# Patient Record
Sex: Female | Born: 1945
Health system: Southern US, Community
[De-identification: ages and names within clinical notes are randomized; demographics above are authoritative.]

## PROBLEM LIST (undated history)

## (undated) DIAGNOSIS — I10 Essential (primary) hypertension: Secondary | ICD-10-CM

## (undated) DIAGNOSIS — L03317 Cellulitis of buttock: Secondary | ICD-10-CM

## (undated) DIAGNOSIS — F0280 Dementia in other diseases classified elsewhere without behavioral disturbance: Secondary | ICD-10-CM

## (undated) DIAGNOSIS — E785 Hyperlipidemia, unspecified: Secondary | ICD-10-CM

## (undated) DIAGNOSIS — F419 Anxiety disorder, unspecified: Secondary | ICD-10-CM

## (undated) DIAGNOSIS — K219 Gastro-esophageal reflux disease without esophagitis: Secondary | ICD-10-CM

## (undated) DIAGNOSIS — J45909 Unspecified asthma, uncomplicated: Secondary | ICD-10-CM

## (undated) DIAGNOSIS — I219 Acute myocardial infarction, unspecified: Secondary | ICD-10-CM

## (undated) DIAGNOSIS — Z973 Presence of spectacles and contact lenses: Secondary | ICD-10-CM

## (undated) DIAGNOSIS — I2510 Atherosclerotic heart disease of native coronary artery without angina pectoris: Secondary | ICD-10-CM

## (undated) DIAGNOSIS — D649 Anemia, unspecified: Secondary | ICD-10-CM

## (undated) DIAGNOSIS — M1990 Unspecified osteoarthritis, unspecified site: Secondary | ICD-10-CM

## (undated) DIAGNOSIS — I739 Peripheral vascular disease, unspecified: Secondary | ICD-10-CM

## (undated) DIAGNOSIS — R011 Cardiac murmur, unspecified: Secondary | ICD-10-CM

## (undated) HISTORY — DX: Acute myocardial infarction, unspecified: I21.9

## (undated) HISTORY — PX: OTHER SURGICAL HISTORY: SHX169

## (undated) HISTORY — DX: Gastro-esophageal reflux disease without esophagitis: K21.9

## (undated) HISTORY — PX: TONSILLECTOMY: SUR1361

## (undated) HISTORY — PX: VASCULAR SURGERY: SHX849

## (undated) HISTORY — DX: Hyperlipidemia, unspecified: E78.5

## (undated) HISTORY — DX: Cellulitis of buttock: L03.317

## (undated) HISTORY — DX: Atherosclerotic heart disease of native coronary artery without angina pectoris: I25.10

## (undated) HISTORY — DX: Essential (primary) hypertension: I10

## (undated) HISTORY — DX: Peripheral vascular disease, unspecified: I73.9

## (undated) HISTORY — PX: ABDOMINAL HYSTERECTOMY: SHX81

## (undated) HISTORY — DX: Unspecified asthma, uncomplicated: J45.909

## (undated) HISTORY — DX: Unspecified osteoarthritis, unspecified site: M19.90

---

## 2004-08-23 ENCOUNTER — Emergency Department (HOSPITAL_COMMUNITY): Admission: EM | Admit: 2004-08-23 | Discharge: 2004-08-23 | Payer: Self-pay | Admitting: Emergency Medicine

## 2004-12-19 ENCOUNTER — Ambulatory Visit (HOSPITAL_COMMUNITY): Admission: RE | Admit: 2004-12-19 | Discharge: 2004-12-19 | Payer: Self-pay | Admitting: Family Medicine

## 2006-06-12 ENCOUNTER — Ambulatory Visit (HOSPITAL_COMMUNITY): Admission: RE | Admit: 2006-06-12 | Discharge: 2006-06-12 | Payer: Self-pay | Admitting: Internal Medicine

## 2007-04-11 ENCOUNTER — Emergency Department (HOSPITAL_COMMUNITY): Admission: EM | Admit: 2007-04-11 | Discharge: 2007-04-11 | Payer: Self-pay | Admitting: Emergency Medicine

## 2007-07-12 ENCOUNTER — Emergency Department (HOSPITAL_COMMUNITY): Admission: EM | Admit: 2007-07-12 | Discharge: 2007-07-13 | Payer: Self-pay | Admitting: Emergency Medicine

## 2007-12-04 ENCOUNTER — Ambulatory Visit (HOSPITAL_COMMUNITY): Admission: RE | Admit: 2007-12-04 | Discharge: 2007-12-04 | Payer: Self-pay | Admitting: Family Medicine

## 2008-10-15 DIAGNOSIS — L03317 Cellulitis of buttock: Secondary | ICD-10-CM

## 2008-10-15 HISTORY — PX: FOOT AMPUTATION: SHX951

## 2008-10-15 HISTORY — DX: Cellulitis of buttock: L03.317

## 2009-09-30 ENCOUNTER — Emergency Department (HOSPITAL_COMMUNITY): Admission: EM | Admit: 2009-09-30 | Discharge: 2009-10-01 | Payer: Self-pay | Admitting: Emergency Medicine

## 2009-10-04 ENCOUNTER — Encounter (HOSPITAL_COMMUNITY): Admission: RE | Admit: 2009-10-04 | Discharge: 2009-10-12 | Payer: Self-pay | Admitting: Emergency Medicine

## 2009-10-13 ENCOUNTER — Ambulatory Visit (HOSPITAL_COMMUNITY): Admission: RE | Admit: 2009-10-13 | Discharge: 2009-10-13 | Payer: Self-pay | Admitting: Family Medicine

## 2009-10-15 HISTORY — PX: OTHER SURGICAL HISTORY: SHX169

## 2009-10-27 ENCOUNTER — Ambulatory Visit: Payer: Self-pay | Admitting: Vascular Surgery

## 2009-11-07 ENCOUNTER — Ambulatory Visit: Payer: Self-pay | Admitting: Vascular Surgery

## 2009-11-07 ENCOUNTER — Ambulatory Visit (HOSPITAL_COMMUNITY): Admission: RE | Admit: 2009-11-07 | Discharge: 2009-11-07 | Payer: Self-pay | Admitting: Vascular Surgery

## 2009-11-24 ENCOUNTER — Ambulatory Visit: Payer: Self-pay | Admitting: Vascular Surgery

## 2010-01-15 ENCOUNTER — Encounter: Payer: Self-pay | Admitting: Emergency Medicine

## 2010-01-16 ENCOUNTER — Inpatient Hospital Stay (HOSPITAL_COMMUNITY): Admission: EM | Admit: 2010-01-16 | Discharge: 2010-01-23 | Payer: Self-pay | Admitting: Pulmonary Disease

## 2010-01-16 ENCOUNTER — Ambulatory Visit: Payer: Self-pay | Admitting: Internal Medicine

## 2010-01-16 ENCOUNTER — Encounter: Payer: Self-pay | Admitting: Pulmonary Disease

## 2010-01-17 ENCOUNTER — Encounter (INDEPENDENT_AMBULATORY_CARE_PROVIDER_SITE_OTHER): Payer: Self-pay | Admitting: Internal Medicine

## 2010-01-20 ENCOUNTER — Encounter (INDEPENDENT_AMBULATORY_CARE_PROVIDER_SITE_OTHER): Payer: Self-pay | Admitting: Internal Medicine

## 2010-01-20 ENCOUNTER — Ambulatory Visit: Payer: Self-pay | Admitting: Vascular Surgery

## 2010-02-15 ENCOUNTER — Ambulatory Visit: Payer: Self-pay | Admitting: Vascular Surgery

## 2010-05-17 ENCOUNTER — Ambulatory Visit: Payer: Self-pay | Admitting: Vascular Surgery

## 2010-06-30 ENCOUNTER — Emergency Department (HOSPITAL_COMMUNITY): Admission: EM | Admit: 2010-06-30 | Discharge: 2010-06-30 | Payer: Self-pay | Admitting: Emergency Medicine

## 2010-07-07 ENCOUNTER — Inpatient Hospital Stay (HOSPITAL_COMMUNITY): Admission: EM | Admit: 2010-07-07 | Discharge: 2010-07-10 | Payer: Self-pay | Admitting: Emergency Medicine

## 2010-07-15 HISTORY — PX: FEMORAL-POPLITEAL BYPASS GRAFT: SHX937

## 2010-07-21 ENCOUNTER — Telehealth: Payer: Self-pay | Admitting: Orthopedic Surgery

## 2010-07-26 ENCOUNTER — Ambulatory Visit: Payer: Self-pay | Admitting: Vascular Surgery

## 2010-07-27 ENCOUNTER — Ambulatory Visit: Payer: Self-pay | Admitting: Vascular Surgery

## 2010-07-27 ENCOUNTER — Encounter: Payer: Self-pay | Admitting: Vascular Surgery

## 2010-07-27 ENCOUNTER — Inpatient Hospital Stay (HOSPITAL_COMMUNITY): Admission: RE | Admit: 2010-07-27 | Discharge: 2010-08-03 | Payer: Self-pay | Admitting: Vascular Surgery

## 2010-07-31 ENCOUNTER — Encounter: Payer: Self-pay | Admitting: Vascular Surgery

## 2010-08-17 ENCOUNTER — Ambulatory Visit: Payer: Self-pay | Admitting: Vascular Surgery

## 2010-08-30 ENCOUNTER — Ambulatory Visit: Payer: Self-pay | Admitting: Vascular Surgery

## 2010-11-16 NOTE — Progress Notes (Signed)
Summary: cancellation of appt, Sherri Brooks ref to foot surgeon by podiatrist  Phone Note Outgoing Call   Call placed to: Sherri Brooks Summary of Call: Reminder Call made to remind of appt with Dr Natasha Mead, 07/24/10. Per Sherri Brooks and daughter Sherri Brooks, Sherri Brooks has already been seen by podiatrist Dr Ulice Brilliant [facility], and he has referred her to foot surgeon; therefore, cancel this appointment. Initial call taken by: Cammie Sickle,  July 21, 2010 9:43 AM

## 2010-11-22 ENCOUNTER — Other Ambulatory Visit (INDEPENDENT_AMBULATORY_CARE_PROVIDER_SITE_OTHER): Payer: MEDICARE

## 2010-11-22 ENCOUNTER — Encounter (INDEPENDENT_AMBULATORY_CARE_PROVIDER_SITE_OTHER): Payer: Medicare Other

## 2010-11-22 DIAGNOSIS — I70269 Atherosclerosis of native arteries of extremities with gangrene, unspecified extremity: Secondary | ICD-10-CM

## 2010-11-22 DIAGNOSIS — Z48812 Encounter for surgical aftercare following surgery on the circulatory system: Secondary | ICD-10-CM

## 2010-11-29 NOTE — Procedures (Unsigned)
BYPASS GRAFT EVALUATION  INDICATION:  Left lower extremity bypass graft.  HISTORY: Diabetes:  Yes. Cardiac:  Yes. Hypertension:  Yes. Smoking:  Yes. Previous Surgery:  Right common iliac artery PTA/stent on 11/07/2009, left fem-pop bypass graft on 07/27/2010.  SINGLE LEVEL ARTERIAL EXAM                              RIGHT              LEFT Brachial:                    187                205 Anterior tibial:             201                218 Posterior tibial:            122                210 Peroneal: Ankle/brachial index:        1.08               1.06  PREVIOUS ABI:  Date: 05/17/2010  RIGHT:  1.0  LEFT:  0.62  LOWER EXTREMITY BYPASS GRAFT DUPLEX EXAM:  DUPLEX:  Biphasic Doppler waveforms noted throughout the left lower extremity bypass graft in its native vessels with no increase in velocities.  IMPRESSION: 1. Patent left femoropopliteal bypass graft with no evidence of     stenosis. 2. Bilateral ankle brachial indices and Doppler waveforms suggest     normal perfusion of the bilateral lower extremities.  ___________________________________________ Di Kindle. Edilia Bo, M.D.  CH/MEDQ  D:  11/22/2010  T:  11/22/2010  Job:  [id number]

## 2010-11-29 NOTE — Procedures (Unsigned)
AORTA-ILIAC DUPLEX EVALUATION  INDICATION:  Right common iliac artery stent.  HISTORY: Diabetes:  Yes. Cardiac:  Yes. Hypertension:  Yes. Smoking:  Yes. Previous Surgery:  Right common iliac artery PTA/stent on 11/07/2009, left fem-pop bypass graft on 07/27/2010.              SINGLE LEVEL ARTERIAL EXAM                             RIGHT                  LEFT Brachial:                  187                    205 Anterior tibial:           201                    218 Posterior tibial:          222                    210 Peroneal: Ankle/brachial index:      1.08                   1.06 Previous ABI/date:         05/17/2010, 1.0        05/17/2010, 0.62  AORTA-ILIAC DUPLEX EXAM Aorta - Proximal     49 cm/s Aorta - Mid          55 cm/s Aorta - Distal       78 cm/s  RIGHT                                   LEFT 160 cm/s          CIA-PROXIMAL 206 cm/s          CIA-DISTAL Not visualized    HYPOGASTRIC 162 cm/s          EIA-PROXIMAL 128 cm/s          EIA-MID 110 cm/s          EIA-DISTAL  IMPRESSION: 1. Patent right common iliac artery stent with a maximal Doppler     velocity of 206 cm/s noted.  The right hypogastric artery was not     adequately visualized due to overlying bowel gas patterns. 2. The bilateral ankle brachial indices and Doppler waveforms suggest     normal perfusion of the bilateral lower extremities.  ___________________________________________ Di Kindle. Edilia Bo, M.D.  CH/MEDQ  D:  11/22/2010  T:  11/22/2010  Job:  [id number]

## 2010-12-27 LAB — GLUCOSE, CAPILLARY
Glucose-Capillary: 117 mg/dL — ABNORMAL HIGH (ref 70–99)
Glucose-Capillary: 129 mg/dL — ABNORMAL HIGH (ref 70–99)
Glucose-Capillary: 135 mg/dL — ABNORMAL HIGH (ref 70–99)
Glucose-Capillary: 150 mg/dL — ABNORMAL HIGH (ref 70–99)
Glucose-Capillary: 151 mg/dL — ABNORMAL HIGH (ref 70–99)
Glucose-Capillary: 154 mg/dL — ABNORMAL HIGH (ref 70–99)
Glucose-Capillary: 169 mg/dL — ABNORMAL HIGH (ref 70–99)
Glucose-Capillary: 209 mg/dL — ABNORMAL HIGH (ref 70–99)
Glucose-Capillary: 225 mg/dL — ABNORMAL HIGH (ref 70–99)
Glucose-Capillary: 313 mg/dL — ABNORMAL HIGH (ref 70–99)
Glucose-Capillary: 33 mg/dL — CL (ref 70–99)
Glucose-Capillary: 39 mg/dL — CL (ref 70–99)
Glucose-Capillary: 42 mg/dL — CL (ref 70–99)
Glucose-Capillary: 53 mg/dL — ABNORMAL LOW (ref 70–99)
Glucose-Capillary: 72 mg/dL (ref 70–99)
Glucose-Capillary: 82 mg/dL (ref 70–99)
Glucose-Capillary: 86 mg/dL (ref 70–99)

## 2010-12-27 LAB — CBC
HCT: 19.2 % — ABNORMAL LOW (ref 36.0–46.0)
HCT: 21.9 % — ABNORMAL LOW (ref 36.0–46.0)
HCT: 27.1 % — ABNORMAL LOW (ref 36.0–46.0)
HCT: 27.4 % — ABNORMAL LOW (ref 36.0–46.0)
Hemoglobin: 7.5 g/dL — ABNORMAL LOW (ref 12.0–15.0)
Hemoglobin: 9.3 g/dL — ABNORMAL LOW (ref 12.0–15.0)
MCH: 28.7 pg (ref 26.0–34.0)
MCH: 28.7 pg (ref 26.0–34.0)
MCH: 29 pg (ref 26.0–34.0)
MCH: 29.2 pg (ref 26.0–34.0)
MCH: 29.3 pg (ref 26.0–34.0)
MCHC: 33.5 g/dL (ref 30.0–36.0)
MCHC: 34.2 g/dL (ref 30.0–36.0)
MCHC: 34.3 g/dL (ref 30.0–36.0)
MCV: 83.9 fL (ref 78.0–100.0)
MCV: 84.3 fL (ref 78.0–100.0)
MCV: 84.4 fL (ref 78.0–100.0)
MCV: 85.3 fL (ref 78.0–100.0)
MCV: 85.8 fL (ref 78.0–100.0)
Platelets: 237 10*3/uL (ref 150–400)
Platelets: 254 10*3/uL (ref 150–400)
Platelets: 290 10*3/uL (ref 150–400)
Platelets: 296 10*3/uL (ref 150–400)
RBC: 2.61 MIL/uL — ABNORMAL LOW (ref 3.87–5.11)
RBC: 3.21 MIL/uL — ABNORMAL LOW (ref 3.87–5.11)
RBC: 3.25 MIL/uL — ABNORMAL LOW (ref 3.87–5.11)
RDW: 14.3 % (ref 11.5–15.5)
RDW: 14.3 % (ref 11.5–15.5)
RDW: 14.4 % (ref 11.5–15.5)
WBC: 12.7 10*3/uL — ABNORMAL HIGH (ref 4.0–10.5)
WBC: 16.9 10*3/uL — ABNORMAL HIGH (ref 4.0–10.5)

## 2010-12-27 LAB — BASIC METABOLIC PANEL WITH GFR
BUN: 6 mg/dL (ref 6–23)
BUN: 8 mg/dL (ref 6–23)
CO2: 23 meq/L (ref 19–32)
CO2: 26 meq/L (ref 19–32)
Calcium: 8.2 mg/dL — ABNORMAL LOW (ref 8.4–10.5)
Calcium: 8.6 mg/dL (ref 8.4–10.5)
Chloride: 106 meq/L (ref 96–112)
Chloride: 108 meq/L (ref 96–112)
Creatinine, Ser: 1.02 mg/dL (ref 0.4–1.2)
Creatinine, Ser: 1.08 mg/dL (ref 0.4–1.2)
GFR calc non Af Amer: 51 mL/min — ABNORMAL LOW
GFR calc non Af Amer: 54 mL/min — ABNORMAL LOW
Glucose, Bld: 76 mg/dL (ref 70–99)
Glucose, Bld: 82 mg/dL (ref 70–99)
Potassium: 3.5 meq/L (ref 3.5–5.1)
Potassium: 3.5 meq/L (ref 3.5–5.1)
Sodium: 137 meq/L (ref 135–145)
Sodium: 138 meq/L (ref 135–145)

## 2010-12-27 LAB — BASIC METABOLIC PANEL
BUN: 15 mg/dL (ref 6–23)
BUN: 9 mg/dL (ref 6–23)
CO2: 24 mEq/L (ref 19–32)
CO2: 26 mEq/L (ref 19–32)
CO2: 26 mEq/L (ref 19–32)
Calcium: 8.3 mg/dL — ABNORMAL LOW (ref 8.4–10.5)
Calcium: 8.6 mg/dL (ref 8.4–10.5)
Chloride: 102 mEq/L (ref 96–112)
Chloride: 102 mEq/L (ref 96–112)
Chloride: 105 mEq/L (ref 96–112)
Creatinine, Ser: 1.2 mg/dL (ref 0.4–1.2)
Creatinine, Ser: 1.35 mg/dL — ABNORMAL HIGH (ref 0.4–1.2)
GFR calc Af Amer: 48 mL/min — ABNORMAL LOW (ref >60)
GFR calc Af Amer: 54 mL/min — ABNORMAL LOW (ref >60)
GFR calc Af Amer: 55 mL/min — ABNORMAL LOW (ref >60)
GFR calc non Af Amer: 39 mL/min — ABNORMAL LOW (ref >60)
Glucose, Bld: 194 mg/dL — ABNORMAL HIGH (ref 70–99)
Glucose, Bld: 212 mg/dL — ABNORMAL HIGH (ref 70–99)
Potassium: 3.2 mEq/L — ABNORMAL LOW (ref 3.5–5.1)
Potassium: 3.4 mEq/L — ABNORMAL LOW (ref 3.5–5.1)
Sodium: 131 mEq/L — ABNORMAL LOW (ref 135–145)
Sodium: 136 mEq/L (ref 135–145)

## 2010-12-28 LAB — COMPREHENSIVE METABOLIC PANEL
ALT: 12 U/L (ref 0–35)
ALT: 12 U/L (ref 0–35)
AST: 16 U/L (ref 0–37)
Albumin: 3.1 g/dL — ABNORMAL LOW (ref 3.5–5.2)
Alkaline Phosphatase: 91 U/L (ref 39–117)
Alkaline Phosphatase: 73 U/L (ref 39–117)
BUN: 18 mg/dL (ref 6–23)
CO2: 25 mEq/L (ref 19–32)
Calcium: 9.6 mg/dL (ref 8.4–10.5)
Calcium: 8.7 mg/dL (ref 8.4–10.5)
Chloride: 102 mEq/L (ref 96–112)
GFR calc Af Amer: 55 mL/min — ABNORMAL LOW (ref >60)
GFR calc non Af Amer: 46 mL/min — ABNORMAL LOW (ref >60)
Potassium: 4.3 mEq/L (ref 3.5–5.1)
Potassium: 3.9 mEq/L (ref 3.5–5.1)
Sodium: 136 mEq/L (ref 135–145)
Sodium: 136 mEq/L (ref 135–145)
Total Protein: 7.9 g/dL (ref 6.0–8.3)

## 2010-12-28 LAB — TYPE AND SCREEN
ABO/RH(D): O POS
Antibody Screen: NEGATIVE
Unit division: 0

## 2010-12-28 LAB — CBC
HCT: 31.6 % — ABNORMAL LOW (ref 36.0–46.0)
HCT: 32.5 % — ABNORMAL LOW (ref 36.0–46.0)
HCT: 33.5 % — ABNORMAL LOW (ref 36.0–46.0)
HCT: 38.2 % (ref 36.0–46.0)
Hemoglobin: 9.2 g/dL — ABNORMAL LOW (ref 12.0–15.0)
Hemoglobin: 10.6 g/dL — ABNORMAL LOW (ref 12.0–15.0)
Hemoglobin: 10.9 g/dL — ABNORMAL LOW (ref 12.0–15.0)
Hemoglobin: 12.8 g/dL (ref 12.0–15.0)
MCH: 29.8 pg (ref 26.0–34.0)
MCH: 30.1 pg (ref 26.0–34.0)
MCHC: 34.1 g/dL (ref 30.0–36.0)
MCHC: 33.5 g/dL (ref 30.0–36.0)
MCHC: 33.6 g/dL (ref 30.0–36.0)
MCHC: 34.8 g/dL (ref 30.0–36.0)
MCV: 87.7 fL (ref 78.0–100.0)
MCV: 88.2 fL (ref 78.0–100.0)
MCV: 89.2 fL (ref 78.0–100.0)
Platelets: 300 10*3/uL (ref 150–400)
Platelets: 337 10*3/uL (ref 150–400)
Platelets: 439 10*3/uL — ABNORMAL HIGH (ref 150–400)
Platelets: 328 10*3/uL (ref 150–400)
RBC: 3.01 MIL/uL — ABNORMAL LOW (ref 3.87–5.11)
RBC: 3.51 MIL/uL — ABNORMAL LOW (ref 3.87–5.11)
RDW: 14.7 % (ref 11.5–15.5)
RDW: 14.7 % (ref 11.5–15.5)
RDW: 14.9 % (ref 11.5–15.5)
RDW: 15.9 % — ABNORMAL HIGH (ref 11.5–15.5)
RDW: 17.3 % — ABNORMAL HIGH (ref 11.5–15.5)
WBC: 11.1 10*3/uL — ABNORMAL HIGH (ref 4.0–10.5)
WBC: 14.1 10*3/uL — ABNORMAL HIGH (ref 4.0–10.5)
WBC: 11.8 10*3/uL — ABNORMAL HIGH (ref 4.0–10.5)
WBC: 12.9 10*3/uL — ABNORMAL HIGH (ref 4.0–10.5)

## 2010-12-28 LAB — GLUCOSE, CAPILLARY
Glucose-Capillary: 111 mg/dL — ABNORMAL HIGH (ref 70–99)
Glucose-Capillary: 134 mg/dL — ABNORMAL HIGH (ref 70–99)
Glucose-Capillary: 156 mg/dL — ABNORMAL HIGH (ref 70–99)
Glucose-Capillary: 170 mg/dL — ABNORMAL HIGH (ref 70–99)
Glucose-Capillary: 171 mg/dL — ABNORMAL HIGH (ref 70–99)
Glucose-Capillary: 19 mg/dL — CL (ref 70–99)
Glucose-Capillary: 235 mg/dL — ABNORMAL HIGH (ref 70–99)
Glucose-Capillary: 75 mg/dL (ref 70–99)
Glucose-Capillary: 150 mg/dL — ABNORMAL HIGH (ref 70–99)
Glucose-Capillary: 179 mg/dL — ABNORMAL HIGH (ref 70–99)
Glucose-Capillary: 220 mg/dL — ABNORMAL HIGH (ref 70–99)

## 2010-12-28 LAB — DIFFERENTIAL
Basophils Absolute: 0 10*3/uL (ref 0.0–0.1)
Basophils Absolute: 0.1 10*3/uL (ref 0.0–0.1)
Basophils Relative: 0 % (ref 0–1)
Basophils Relative: 0 % (ref 0–1)
Basophils Relative: 0 % (ref 0–1)
Basophils Relative: 1 % (ref 0–1)
Eosinophils Absolute: 0.2 10*3/uL (ref 0.0–0.7)
Eosinophils Absolute: 0.3 10*3/uL (ref 0.0–0.7)
Eosinophils Relative: 1 % (ref 0–5)
Eosinophils Relative: 2 % (ref 0–5)
Eosinophils Relative: 2 % (ref 0–5)
Lymphs Abs: 3.1 10*3/uL (ref 0.7–4.0)
Monocytes Absolute: 0.7 10*3/uL (ref 0.1–1.0)
Monocytes Absolute: 0.8 10*3/uL (ref 0.1–1.0)
Monocytes Absolute: 0.9 10*3/uL (ref 0.1–1.0)
Monocytes Relative: 7 % (ref 3–12)
Monocytes Relative: 8 % (ref 3–12)
Neutro Abs: 6.8 10*3/uL (ref 1.7–7.7)

## 2010-12-28 LAB — BASIC METABOLIC PANEL
BUN: 22 mg/dL (ref 6–23)
BUN: 14 mg/dL (ref 6–23)
BUN: 20 mg/dL (ref 6–23)
CO2: 25 mEq/L (ref 19–32)
CO2: 27 mEq/L (ref 19–32)
Calcium: 8.6 mg/dL (ref 8.4–10.5)
Chloride: 103 mEq/L (ref 96–112)
Chloride: 97 mEq/L (ref 96–112)
Creatinine, Ser: 1.65 mg/dL — ABNORMAL HIGH (ref 0.4–1.2)
Creatinine, Ser: 1.24 mg/dL — ABNORMAL HIGH (ref 0.4–1.2)
GFR calc Af Amer: 38 mL/min — ABNORMAL LOW (ref >60)
GFR calc non Af Amer: 43 mL/min — ABNORMAL LOW (ref >60)
GFR calc non Af Amer: 50 mL/min — ABNORMAL LOW (ref >60)
Glucose, Bld: 114 mg/dL — ABNORMAL HIGH (ref 70–99)
Glucose, Bld: 226 mg/dL — ABNORMAL HIGH (ref 70–99)
Glucose, Bld: 286 mg/dL — ABNORMAL HIGH (ref 70–99)
Potassium: 3.4 mEq/L — ABNORMAL LOW (ref 3.5–5.1)
Potassium: 3.7 mEq/L (ref 3.5–5.1)
Potassium: 3.8 mEq/L (ref 3.5–5.1)
Sodium: 139 mEq/L (ref 135–145)
Sodium: 140 mEq/L (ref 135–145)

## 2010-12-28 LAB — URINALYSIS, ROUTINE W REFLEX MICROSCOPIC
Hgb urine dipstick: NEGATIVE
Nitrite: NEGATIVE
Specific Gravity, Urine: 1.03 (ref 1.005–1.030)
Urobilinogen, UA: 1 mg/dL (ref 0.0–1.0)

## 2010-12-28 LAB — HEMOGLOBIN A1C: Hgb A1c MFr Bld: 9.2 % — ABNORMAL HIGH (ref <5.7)

## 2010-12-28 LAB — LIPID PANEL
HDL: 32 mg/dL — ABNORMAL LOW (ref >39)
Triglycerides: 88 mg/dL (ref <150)
VLDL: 18 mg/dL (ref 0–40)

## 2010-12-28 LAB — CULTURE, BLOOD (ROUTINE X 2)
Culture: NO GROWTH
Report Status: 9292011

## 2010-12-28 LAB — PROTIME-INR: INR: 1.11 (ref 0.00–1.49)

## 2010-12-28 LAB — VANCOMYCIN, RANDOM: Vancomycin Rm: <5 ug/mL

## 2011-01-01 LAB — POCT I-STAT, CHEM 8
Calcium, Ion: 1.15 mmol/L (ref 1.12–1.32)
Chloride: 112 mEq/L (ref 96–112)
HCT: 40 % (ref 36.0–46.0)
Sodium: 139 mEq/L (ref 135–145)

## 2011-01-01 LAB — GLUCOSE, CAPILLARY: Glucose-Capillary: 120 mg/dL — ABNORMAL HIGH (ref 70–99)

## 2011-01-03 LAB — COMPREHENSIVE METABOLIC PANEL
ALT: 13 U/L (ref 0–35)
ALT: 12 U/L (ref 0–35)
AST: 20 U/L (ref 0–37)
AST: 20 U/L (ref 0–37)
Albumin: 2.4 g/dL — ABNORMAL LOW (ref 3.5–5.2)
Albumin: 2.7 g/dL — ABNORMAL LOW (ref 3.5–5.2)
Alkaline Phosphatase: 90 U/L (ref 39–117)
BUN: 10 mg/dL (ref 6–23)
CO2: 16 mEq/L — ABNORMAL LOW (ref 19–32)
CO2: 15 mEq/L — ABNORMAL LOW (ref 19–32)
Calcium: 10.7 mg/dL — ABNORMAL HIGH (ref 8.4–10.5)
Calcium: 10.7 mg/dL — ABNORMAL HIGH (ref 8.4–10.5)
Creatinine, Ser: 0.89 mg/dL (ref 0.4–1.2)
GFR calc Af Amer: 22 mL/min — ABNORMAL LOW (ref >60)
GFR calc Af Amer: 25 mL/min — ABNORMAL LOW (ref >60)
GFR calc non Af Amer: 21 mL/min — ABNORMAL LOW (ref >60)
Glucose, Bld: 188 mg/dL — ABNORMAL HIGH (ref 70–99)
Sodium: 132 mEq/L — ABNORMAL LOW (ref 135–145)
Sodium: 133 mEq/L — ABNORMAL LOW (ref 135–145)
Total Bilirubin: 0.5 mg/dL (ref 0.3–1.2)
Total Protein: 9.8 g/dL — ABNORMAL HIGH (ref 6.0–8.3)
Total Protein: 6.9 g/dL (ref 6.0–8.3)
Total Protein: 8.2 g/dL (ref 6.0–8.3)

## 2011-01-03 LAB — BASIC METABOLIC PANEL
BUN: 11 mg/dL (ref 6–23)
BUN: 17 mg/dL (ref 6–23)
BUN: 24 mg/dL — ABNORMAL HIGH (ref 6–23)
BUN: 7 mg/dL (ref 6–23)
BUN: 7 mg/dL (ref 6–23)
CO2: 15 mEq/L — ABNORMAL LOW (ref 19–32)
CO2: 21 mEq/L (ref 19–32)
CO2: 23 mEq/L (ref 19–32)
CO2: 23 mEq/L (ref 19–32)
Calcium: 10.1 mg/dL (ref 8.4–10.5)
Calcium: 7.6 mg/dL — ABNORMAL LOW (ref 8.4–10.5)
Calcium: 7.7 mg/dL — ABNORMAL LOW (ref 8.4–10.5)
Calcium: 7.8 mg/dL — ABNORMAL LOW (ref 8.4–10.5)
Calcium: 8.1 mg/dL — ABNORMAL LOW (ref 8.4–10.5)
Calcium: 8.4 mg/dL (ref 8.4–10.5)
Calcium: 9.1 mg/dL (ref 8.4–10.5)
Chloride: 107 mEq/L (ref 96–112)
Chloride: 108 mEq/L (ref 96–112)
Chloride: 108 mEq/L (ref 96–112)
Chloride: 115 mEq/L — ABNORMAL HIGH (ref 96–112)
Chloride: 117 mEq/L — ABNORMAL HIGH (ref 96–112)
Creatinine, Ser: 0.86 mg/dL (ref 0.4–1.2)
Creatinine, Ser: 1.26 mg/dL — ABNORMAL HIGH (ref 0.4–1.2)
Creatinine, Ser: 1.35 mg/dL — ABNORMAL HIGH (ref 0.4–1.2)
Creatinine, Ser: 2.02 mg/dL — ABNORMAL HIGH (ref 0.4–1.2)
GFR calc Af Amer: 30 mL/min — ABNORMAL LOW (ref >60)
GFR calc Af Amer: 35 mL/min — ABNORMAL LOW (ref >60)
GFR calc Af Amer: 48 mL/min — ABNORMAL LOW (ref >60)
GFR calc Af Amer: 52 mL/min — ABNORMAL LOW (ref >60)
GFR calc Af Amer: >60 mL/min (ref >60)
GFR calc Af Amer: >60 mL/min (ref >60)
GFR calc non Af Amer: 29 mL/min — ABNORMAL LOW (ref >60)
GFR calc non Af Amer: 39 mL/min — ABNORMAL LOW (ref >60)
GFR calc non Af Amer: 42 mL/min — ABNORMAL LOW (ref >60)
GFR calc non Af Amer: >60 mL/min (ref >60)
Glucose, Bld: 209 mg/dL — ABNORMAL HIGH (ref 70–99)
Glucose, Bld: 242 mg/dL — ABNORMAL HIGH (ref 70–99)
Glucose, Bld: 267 mg/dL — ABNORMAL HIGH (ref 70–99)
Glucose, Bld: 58 mg/dL — ABNORMAL LOW (ref 70–99)
Potassium: 3 mEq/L — ABNORMAL LOW (ref 3.5–5.1)
Potassium: 3.4 mEq/L — ABNORMAL LOW (ref 3.5–5.1)
Potassium: 4.2 mEq/L (ref 3.5–5.1)
Potassium: 4.4 mEq/L (ref 3.5–5.1)
Sodium: 131 mEq/L — ABNORMAL LOW (ref 135–145)
Sodium: 134 mEq/L — ABNORMAL LOW (ref 135–145)
Sodium: 134 mEq/L — ABNORMAL LOW (ref 135–145)
Sodium: 135 mEq/L (ref 135–145)
Sodium: 137 mEq/L (ref 135–145)
Sodium: 139 mEq/L (ref 135–145)
Sodium: 141 mEq/L (ref 135–145)

## 2011-01-03 LAB — DIFFERENTIAL
Basophils Absolute: 0.1 10*3/uL (ref 0.0–0.1)
Eosinophils Absolute: 0 10*3/uL (ref 0.0–0.7)
Eosinophils Absolute: 0 10*3/uL (ref 0.0–0.7)
Eosinophils Absolute: 0.2 10*3/uL (ref 0.0–0.7)
Eosinophils Relative: 0 % (ref 0–5)
Eosinophils Relative: 0 % (ref 0–5)
Eosinophils Relative: 2 % (ref 0–5)
Lymphocytes Relative: 24 % (ref 12–46)
Lymphs Abs: 1.6 10*3/uL (ref 0.7–4.0)
Lymphs Abs: 1.7 10*3/uL (ref 0.7–4.0)
Lymphs Abs: 2.7 10*3/uL (ref 0.7–4.0)
Monocytes Absolute: 0.8 10*3/uL (ref 0.1–1.0)
Monocytes Relative: 6 % (ref 3–12)
Neutro Abs: 6.6 10*3/uL (ref 1.7–7.7)
Neutrophils Relative %: 71 % (ref 43–77)

## 2011-01-03 LAB — POCT I-STAT EG7
HCT: 30 % — ABNORMAL LOW (ref 36.0–46.0)
Hemoglobin: 10.2 g/dL — ABNORMAL LOW (ref 12.0–15.0)
Potassium: >9 mEq/L (ref 3.5–5.1)
Sodium: 135 mEq/L (ref 135–145)
TCO2: 15 mmol/L (ref 0–100)
pH, Ven: 7.464 — ABNORMAL HIGH (ref 7.250–7.300)

## 2011-01-03 LAB — URINALYSIS, ROUTINE W REFLEX MICROSCOPIC
Bilirubin Urine: NEGATIVE
Hgb urine dipstick: NEGATIVE
Ketones, ur: NEGATIVE mg/dL
Ketones, ur: NEGATIVE mg/dL
Nitrite: NEGATIVE
Protein, ur: NEGATIVE mg/dL
Specific Gravity, Urine: 1.017 (ref 1.005–1.030)
Urobilinogen, UA: 0.2 mg/dL (ref 0.0–1.0)
Urobilinogen, UA: 1 mg/dL (ref 0.0–1.0)

## 2011-01-03 LAB — GLUCOSE, CAPILLARY
Glucose-Capillary: 323 mg/dL — ABNORMAL HIGH (ref 70–99)
Glucose-Capillary: 104 mg/dL — ABNORMAL HIGH (ref 70–99)
Glucose-Capillary: 105 mg/dL — ABNORMAL HIGH (ref 70–99)
Glucose-Capillary: 127 mg/dL — ABNORMAL HIGH (ref 70–99)
Glucose-Capillary: 141 mg/dL — ABNORMAL HIGH (ref 70–99)
Glucose-Capillary: 152 mg/dL — ABNORMAL HIGH (ref 70–99)
Glucose-Capillary: 157 mg/dL — ABNORMAL HIGH (ref 70–99)
Glucose-Capillary: 167 mg/dL — ABNORMAL HIGH (ref 70–99)
Glucose-Capillary: 171 mg/dL — ABNORMAL HIGH (ref 70–99)
Glucose-Capillary: 183 mg/dL — ABNORMAL HIGH (ref 70–99)
Glucose-Capillary: 222 mg/dL — ABNORMAL HIGH (ref 70–99)
Glucose-Capillary: 239 mg/dL — ABNORMAL HIGH (ref 70–99)
Glucose-Capillary: 246 mg/dL — ABNORMAL HIGH (ref 70–99)
Glucose-Capillary: 253 mg/dL — ABNORMAL HIGH (ref 70–99)
Glucose-Capillary: 275 mg/dL — ABNORMAL HIGH (ref 70–99)

## 2011-01-03 LAB — CULTURE, BLOOD (ROUTINE X 2): Culture: NO GROWTH

## 2011-01-03 LAB — CBC
HCT: 12 % — ABNORMAL LOW (ref 36.0–46.0)
HCT: 20.6 % — ABNORMAL LOW (ref 36.0–46.0)
HCT: 23 % — ABNORMAL LOW (ref 36.0–46.0)
HCT: 24.1 % — ABNORMAL LOW (ref 36.0–46.0)
HCT: 25.4 % — ABNORMAL LOW (ref 36.0–46.0)
Hemoglobin: 6.8 g/dL — CL (ref 12.0–15.0)
Hemoglobin: 7.8 g/dL — ABNORMAL LOW (ref 12.0–15.0)
Hemoglobin: 8.5 g/dL — ABNORMAL LOW (ref 12.0–15.0)
MCHC: 34.3 g/dL (ref 30.0–36.0)
MCHC: 33.1 g/dL (ref 30.0–36.0)
MCHC: 33.4 g/dL (ref 30.0–36.0)
MCHC: 33.5 g/dL (ref 30.0–36.0)
MCHC: 33.8 g/dL (ref 30.0–36.0)
MCV: 91.3 fL (ref 78.0–100.0)
MCV: 92.4 fL (ref 78.0–100.0)
MCV: 92.9 fL (ref 78.0–100.0)
MCV: 95.1 fL (ref 78.0–100.0)
Platelets: 325 10*3/uL (ref 150–400)
Platelets: 332 10*3/uL (ref 150–400)
Platelets: 357 10*3/uL (ref 150–400)
Platelets: 372 10*3/uL (ref 150–400)
Platelets: 404 10*3/uL — ABNORMAL HIGH (ref 150–400)
Platelets: 437 10*3/uL — ABNORMAL HIGH (ref 150–400)
Platelets: 524 10*3/uL — ABNORMAL HIGH (ref 150–400)
RBC: 3.35 MIL/uL — ABNORMAL LOW (ref 3.87–5.11)
RBC: 2.25 MIL/uL — ABNORMAL LOW (ref 3.87–5.11)
RBC: 2.64 MIL/uL — ABNORMAL LOW (ref 3.87–5.11)
RBC: 3.07 MIL/uL — ABNORMAL LOW (ref 3.87–5.11)
RDW: 15.7 % — ABNORMAL HIGH (ref 11.5–15.5)
RDW: 15.7 % — ABNORMAL HIGH (ref 11.5–15.5)
RDW: 15.8 % — ABNORMAL HIGH (ref 11.5–15.5)
RDW: 15.9 % — ABNORMAL HIGH (ref 11.5–15.5)
RDW: 16.8 % — ABNORMAL HIGH (ref 11.5–15.5)
RDW: 17.1 % — ABNORMAL HIGH (ref 11.5–15.5)
RDW: 18.2 % — ABNORMAL HIGH (ref 11.5–15.5)
WBC: 14 10*3/uL — ABNORMAL HIGH (ref 4.0–10.5)
WBC: 8.4 10*3/uL (ref 4.0–10.5)
WBC: 8.8 10*3/uL (ref 4.0–10.5)
WBC: 9.6 10*3/uL (ref 4.0–10.5)

## 2011-01-03 LAB — CROSSMATCH: ABO/RH(D): O POS

## 2011-01-03 LAB — ABO/RH: ABO/RH(D): O POS

## 2011-01-03 LAB — URINE CULTURE

## 2011-01-03 LAB — HEMOGLOBIN A1C
Hgb A1c MFr Bld: 9.3 % — ABNORMAL HIGH (ref 4.6–6.1)
Mean Plasma Glucose: 220 mg/dL

## 2011-01-03 LAB — POCT CARDIAC MARKERS: Troponin i, poc: <0.05 ng/mL (ref 0.00–0.09)

## 2011-01-03 LAB — CARDIAC PANEL(CRET KIN+CKTOT+MB+TROPI)
CK, MB: 3.1 ng/mL (ref 0.3–4.0)
Relative Index: 0.4 (ref 0.0–2.5)
Relative Index: 0.5 (ref 0.0–2.5)
Total CK: 286 U/L — ABNORMAL HIGH (ref 7–177)
Troponin I: 0.03 ng/mL (ref 0.00–0.06)
Troponin I: 0.05 ng/mL (ref 0.00–0.06)
Troponin I: 0.06 ng/mL (ref 0.00–0.06)

## 2011-01-03 LAB — NA AND K (SODIUM & POTASSIUM), RAND UR: Potassium Urine: 120 mEq/L

## 2011-01-03 LAB — TECHNOLOGIST SMEAR REVIEW

## 2011-01-03 LAB — MAGNESIUM: Magnesium: 1.8 mg/dL (ref 1.5–2.5)

## 2011-01-03 LAB — PROTIME-INR
INR: 1.26 (ref 0.00–1.49)
Prothrombin Time: 15.7 seconds — ABNORMAL HIGH (ref 11.6–15.2)

## 2011-01-03 LAB — RETICULOCYTES
RBC.: 2.44 MIL/uL — ABNORMAL LOW (ref 3.87–5.11)
Retic Count, Absolute: 61 10*3/uL (ref 19.0–186.0)
Retic Ct Pct: 2.5 % (ref 0.4–3.1)

## 2011-01-03 LAB — APTT: aPTT: 27 seconds (ref 24–37)

## 2011-01-03 LAB — FOLATE: Folate: 9.8 ng/mL

## 2011-01-03 LAB — MRSA PCR SCREENING: MRSA by PCR: NEGATIVE

## 2011-01-03 LAB — LACTIC ACID, PLASMA: Lactic Acid, Venous: 1.1 mmol/L (ref 0.5–2.2)

## 2011-01-15 LAB — DIFFERENTIAL
Basophils Absolute: 0 10*3/uL (ref 0.0–0.1)
Basophils Relative: 0 % (ref 0–1)
Eosinophils Absolute: 0.1 10*3/uL (ref 0.0–0.7)
Lymphs Abs: 3.2 10*3/uL (ref 0.7–4.0)
Neutrophils Relative %: 68 % (ref 43–77)

## 2011-01-15 LAB — CBC
MCV: 95.4 fL (ref 78.0–100.0)
Platelets: 257 10*3/uL (ref 150–400)
WBC: 12.5 10*3/uL — ABNORMAL HIGH (ref 4.0–10.5)

## 2011-01-15 LAB — BASIC METABOLIC PANEL
Calcium: 9.3 mg/dL (ref 8.4–10.5)
Creatinine, Ser: 1.26 mg/dL — ABNORMAL HIGH (ref 0.4–1.2)
GFR calc Af Amer: 52 mL/min — ABNORMAL LOW (ref >60)
GFR calc non Af Amer: 43 mL/min — ABNORMAL LOW (ref >60)
Glucose, Bld: 296 mg/dL — ABNORMAL HIGH (ref 70–99)
Sodium: 136 mEq/L (ref 135–145)

## 2011-02-21 ENCOUNTER — Encounter (INDEPENDENT_AMBULATORY_CARE_PROVIDER_SITE_OTHER): Payer: MEDICARE

## 2011-02-21 ENCOUNTER — Ambulatory Visit (INDEPENDENT_AMBULATORY_CARE_PROVIDER_SITE_OTHER): Payer: MEDICARE | Admitting: Vascular Surgery

## 2011-02-21 DIAGNOSIS — I789 Disease of capillaries, unspecified: Secondary | ICD-10-CM

## 2011-02-21 DIAGNOSIS — Z48812 Encounter for surgical aftercare following surgery on the circulatory system: Secondary | ICD-10-CM

## 2011-02-21 DIAGNOSIS — I70219 Atherosclerosis of native arteries of extremities with intermittent claudication, unspecified extremity: Secondary | ICD-10-CM

## 2011-02-21 DIAGNOSIS — I739 Peripheral vascular disease, unspecified: Secondary | ICD-10-CM

## 2011-02-22 NOTE — Assessment & Plan Note (Signed)
OFFICE VISIT  CATALEA, LABRECQUE DOB:  1946-05-14                                       02/21/2011 ZOXWR#:[id number]  I saw patient in the office today for continued follow-up of her peripheral vascular disease.  In October 2011 she had presented with gangrene of her left foot.  She had left femoral to below-knee pop bypass with a 6 mm Propaten graft, as her left greater saphenous vein was not usable for a bypass conduit.  In addition, she had left transmetatarsal amputation.  She comes in for a routine visit.  She states she has been ambulating without difficulty.  She has had no claudication or rest pain.  Her TMA has healed nicely.  Her only complaint has been some pain in her hips, which occurs with walking, standing, and sitting.  It seems to be affected by the weather, and her symptoms are worse when it has been raining.  She is scheduled to see an orthopedic surgeon concerning this.  SOCIAL HISTORY:  She continues to smoke a pack per day of cigarettes and has been smoking since a teenager.  REVIEW OF SYSTEMS:  CARDIOVASCULAR:  She has had no chest pain, chest pressure, palpitations or arrhythmias. PULMONARY:  She has had no productive cough bronchitis, asthma or wheezing.  PHYSICAL EXAMINATION:  This is a pleasant 66 year old woman who appears her stated age.  Blood pressure is 180/88, heart rate is 76, respiratory rate is 16.  Lungs are clear bilaterally to auscultation without rales, rhonchi or wheezing.  Cardiovascular:  I do not detect any carotid bruits.  She has a regular rate and rhythm.  She has palpable femoral pulses.  I cannot palpate pedal pulses; however, both feet are warm and well-perfused.  Abdomen:  Soft and nontender with normal-pitched bowel sounds.  Neurologic:  There is no focal weakness or paresthesias. Musculoskeletal:  She has a healed left transmetatarsal amputation.  I have independently interpreted her arterial Doppler  study today which shows an ABI of 98% on the right and 100% on the left.  These ABIs are stable compared to a previous study from 11/22/10.  I have also reviewed her duplex scan of her iliac stent.  She does have some elevated velocities within her right iliac stent and will need to keep an eye on this.  Duplex of her bypass graft shows that this is widely patent.  Overall, I am pleased with her progress, and I have ordered a follow-up duplex scan in 6 months.  I will plan to see her back in 1 year unless there has been any significant change in her follow-up duplex scan or ABIs in 6 months.  In addition, we have had a long discussion about the importance of tobacco cessation in terms of both improving patency of her bypass graft and also improving patency of her stent.  I have given her the information for [facility] free tobacco cessation program.    Di Kindle. Edilia Bo, M.D. Electronically Signed  CSD/MEDQ  D:  02/21/2011  T:  02/22/2011  Job:  [id number]

## 2011-02-27 NOTE — Consult Note (Signed)
VASCULAR SURGERY CONSULTATION   Sherri Brooks, Sherri Brooks  DOB:  [birth year]                                       10/27/2009  ZOXWR#:[id number]   I saw the patient in the office today in consultation concerning a wound  on her left great toe.  She was referred by Dr. Ulice Brilliant.  This is a  pleasant 65 year old woman who developed dry gangrene of the tip of the  left great toe approximately 1 month ago.  She has been followed by Dr.  Ulice Brilliant and is on Cipro and Bactrim.  The wound has been dry with no  significant drainage.  Her activity is fairly limited.  She does  describe some mild bilateral calf claudication which is more significant  on the left side.  This is brought on by ambulation, relieved with rest.  There are no other aggravating or alleviating factors.  This began  approximately 3 months ago and has been stable over the last 3 months.  She has had no rest pain and no history of nonhealing ulcers.  She does  have some associated hip pain although this occurs simply with standing  and this more likely represents arthritic pain.   As part of her workup she underwent an arterial Doppler study which I  reviewed and this shows monophasic Doppler signals in both feet with an  ABI of 65% on the right and 47% on the left.  Of note, she had biphasic  Doppler signals in the left groin, but monophasic signals below this.   In addition, she had toe pressures obtained and the toe pressure on the  right was 58 mmHg and on the left 51 mmHg.   She was sent for vascular consultation given the evidence of significant  peripheral vascular disease.   PAST MEDICAL HISTORY:  Significant for adult onset diabetes which she  has had since age 68.  This has been stable on her current medications.  In addition she has hypertension, hypercholesterolemia, both stable on  her current medications.   She has had no previous history of myocardial infarction, history of  congestive heart failure,  history of COPD.   PAST SURGICAL HISTORY:  Significant for previous tonsillectomy and  hysterectomy.   FAMILY HISTORY:  She is unaware of any history of premature  cardiovascular disease.   SOCIAL HISTORY:  She is widowed.  She has three children.  She currently  smokes a pack per day of cigarettes.  She has been smoking for 30 years.  She would like to quit.  She does not drink alcohol on a regular basis.   REVIEW OF SYSTEMS:  GENERAL:  She has had no recent weight loss, weight  gain.  She has had some loss of appetite.  CARDIOVASCULAR:  She has had  no chest pain, chest pressure, palpitations or arrhythmias.  She has had  no history of stroke, TIAs or amaurosis fugax.  She has had no history  of DVT or phlebitis.  PULMONARY:  She has had no productive cough,  asthma or wheezing.  She has had bronchitis in the past.  MUSCULOSKELETAL:  She does admit to some generalized arthritis but most  significantly on the left hip.  GI, GU, NEUROLOGIC, ENT, HEMATOLOGIC AND  INTEGUMENTARY review of systems are unremarkable and documented in  medical history form in the chart.  PSYCHIATRIC:  She does admit to some  occasional anxiety and depression.   PHYSICAL EXAMINATION:  GENERAL:  This is a pleasant 65 year old woman  who appears her stated age.  VITAL SIGNS:  Her blood pressure is 158/75, heart rate is 98,  temperature 97.9.  HEENT:  Pupils are equal, round, reactive to light and accommodation.  Extraocular motions are intact.  Conjunctivae are normal.  NECK:  Supple.  There is no jugular venous distention.  LUNGS:  Clear bilaterally to auscultation without rales, rhonchi or  wheezing.  CARDIOVASCULAR:  I do not detect any carotid bruits.  She has a regular  rate and rhythm without murmur or gallop appreciated.  She has mild  peripheral edema bilaterally.  She has palpable radial and femoral  pulses.  I cannot palpate popliteal or pedal pulses on either side.  ABDOMEN:  Soft and nontender  with no masses appreciated.  Normal pitched  bowel sounds.  MUSCULOSKELETAL:  She has no major deformities or cyanosis.  She has dry  gangrene of the tip of the left great toe.  NEUROLOGIC:  She has no focal weakness or paresthesias.  SKIN:  She has dry gangrene on the toe, but no significant erythema or  rashes or other ulcers present.  LYMPHATIC:  She has no significant cervical, axillary or inguinal  lymphadenopathy.  PSYCHIATRIC:  She is alert and oriented and appropriate.   Based on physical examination and Doppler study she has evidence of  significant infrainguinal arterial occlusive disease bilaterally.  Given  the ABI of 47% on the left with a toe pressure of 50, I do not think she  has adequate circulation to heal the toe wound or to heal a toe  amputation.  I have recommended we proceed with arteriography to see  what options she might have for revascularization.  This has been  scheduled for November 07, 2009.  We have discussed the indications for  the procedure and potential complications including but not limited to  bleeding, arterial injury, and dye reaction and renal insufficiency.  We  have also discussed potential angioplasty if we find disease amenable to  angioplasty.  We have discussed the potential risks associated with this  including the risk of arterial injury, arterial thrombosis or  dissection.  All her questions were answered.  She is agreeable to  proceed.  We will make further recommendations pending results of her  arteriogram.  In addition, I had a long discussion about the importance  of tobacco cessation and she seems intent on trying to quit.     Di Kindle. Edilia Bo, M.D.  Electronically Signed  CSD/MEDQ  D:  10/27/2009  T:  10/28/2009  Job:  [id number]

## 2011-02-27 NOTE — Assessment & Plan Note (Signed)
OFFICE VISIT   Sherri Brooks, Sherri Brooks  DOB:  01/25/1946                                       11/24/2009  WUXLK#:[id number]   I saw the patient in the office today for continued followup of her dry  gangrene of the left great toe.  I had seen her in consultation  initially on 10/27/2009.  At that time she had an ABI on the left of 47%  with a toe pressure of 50 which was marginal for adequate circulation  and healing.  She was scheduled for an arteriogram to evaluate her for  revascularization.  She had an arteriogram on 11/07/2009.  She was found  to have an 80% stenosis in the right common iliac artery which was  successfully ballooned and stented.  On the left side she did not have  any significant iliac artery occlusive disease just some mild diffuse  disease.  She had an area of moderate stenosis in the adductor canal on  the left over several centimeters producing an approximately 70%  stenosis and then an area over several centimeters just above the knee  producing 80%-90% stenosis.  Below that her below knee popliteal artery  was patent on the left with two vessel runoff via the posterior tibial  and peroneal arteries with the peroneal artery being dominant.  The  anterior tibial was occluded.  She comes in for a routine wound check.  She has had no fever or chills.  She has had no real pain associated  with the foot.   REVIEW OF SYSTEMS:  She has had no chest pressure, chest pain,  palpitations or arrhythmias.  She has had no significant shortness of  breath.   PHYSICAL EXAMINATION:  General:  This is a pleasant 63-year woman who  appears her stated age.  Vital signs:  Blood pressure is 161/75, heart  rate is 118, respiratory rate 18.  She has palpable femoral pulses  bilaterally.  I cannot palpate pedal pulses.  Both feet, however, are  warm and well-perfused.  There has been no change in the dry gangrene in  the tip of her left great toe.  There is  no cellulitis or drainage.   I have instructed her to soak the foot daily with lukewarm Dial soap  soaks.  Currently she has completed a course of Cipro and there is no  evidence of infection.  I have written her a prescription for Cipro if  she develops any erythema or drainage so she can start antibiotics  immediately.  However, hopefully, the tip of the toe will auto amputate.  Toe pressure is 50 mmHg.  If there is any progression or if she develops  any erythema or drainage then I think we would need to proceed with left  femoral to below knee pop bypass with great toe amputation.  However,  for the time being we are going to continue with conservative treatment  in hopes of avoiding revascularization if at all possible.     Di Kindle. Edilia Bo, M.D.  Electronically Signed   CSD/MEDQ  D:  11/24/2009  T:  11/25/2009  Job:  [id number]

## 2011-02-27 NOTE — Procedures (Signed)
LOWER EXTREMITY ARTERIAL DUPLEX   INDICATION:  Status post right PTA and stent of right common iliac  artery   HISTORY:  Diabetes:  yes  Cardiac:  yes  Hypertension:  yes  Smoking:  yes  Previous Surgery:  Right PTA and stent of right common iliac artery on  11/07/2009   SINGLE LEVEL ARTERIAL EXAM                          RIGHT                LEFT  Brachial:               216                  214  Anterior tibial:        204                  134  Posterior tibial:       217                  127  Peroneal:  Ankle/Brachial Index:   1.00                 0.62   Previous ABI done on 10/21/2009, 0.65 on the right, 0.47 on the left  (done at [facility])   LOWER EXTREMITY ARTERIAL DUPLEX EXAM   DUPLEX:  Patent right common iliac artery stent with no evidence of  restenosis   IMPRESSION:  1. Significantly improved ankle brachial indices from previous exam.  2. Patent right common iliac artery stent with no evidence of      restenosis.         ___________________________________________  Di Kindle. Edilia Bo, M.D.   NT/MEDQ  D:  05/17/2010  T:  05/17/2010  Job:  [id number]

## 2011-02-27 NOTE — Assessment & Plan Note (Signed)
OFFICE VISIT   Sherri Brooks, Sherri Brooks  DOB:  [birth year]                                       08/30/2010  VZDGL#:[id number]   HISTORY OF PRESENT ILLNESS:  I saw this patient in the office today for  followup after her recent left transmetatarsal amputation and left fem-  pop bypass with a Propaten graft.  This was done on 07/27/2010.  She had  been doing well and has no specific complaints.  She has had no fever or  chills.  She has been ambulating with her Darco shoe.   PHYSICAL EXAMINATION:  Vital signs:  On examination, blood pressure is  119/72, heart rate is 85, temperature is 98.1.  She has a palpable  popliteal pulse and warm and well-perfused foot.  TMA is healing nicely  and we removed her staples today.   I will see her back in 3 weeks and at that point she may be able to  start wearing a regular shoe instead of her Darco boot.     Di Kindle. Edilia Bo, M.D.  Electronically Signed   CSD/MEDQ  D:  08/30/2010  T:  08/31/2010  Job:  [id number]

## 2011-02-27 NOTE — Assessment & Plan Note (Signed)
OFFICE VISIT   Sherri Brooks, Sherri Brooks  DOB:  Mar 30, 1946                                       08/17/2010  ZOXWR#:[id number]   The patient presented to the office today for postoperative evaluation.  She previously had a left femoral to below knee popliteal artery bypass  with Propaten by Dr. Edilia Bo on October 13.  She also had a left  transmetatarsal amputation by Dr. Edilia Bo at that time.   She reports no significant drainage from her incisions.   PHYSICAL EXAM:  Blood pressure is 180/79 in the left arm, heart rate 79  and regular.  Temperature is 97.9.  The left groin incision has a 1 cm  area of separation at the superior aspect.  The tissue is clean and  granulating.  Other incisions are well-healed.  The transmetatarsal  amputation has some dark discoloration of the skin edge at the central  portion but there is no drainage from this.  Staples are all intact.  She does have fairly dry skin surrounding the foot.  She was counseled  on applying moisturizing lotion to this.   She reports no rest pain in the left foot.  The foot is warm and well-  perfused.  She seems to have a patent bypass graft.   I believe the staples should probably remain in a few more weeks due to  the marginal skin edges in the central aspect of her transmetatarsal  amputation.  She will follow up with Dr. Edilia Bo in 2 weeks' time and  hopefully have staple removal at that time.     Janetta Hora. Fields, MD  Electronically Signed   CEF/MEDQ  D:  08/17/2010  T:  08/18/2010  Job:  [id number]   cc:   Di Kindle. Edilia Bo, M.D.

## 2011-02-27 NOTE — Assessment & Plan Note (Signed)
OFFICE VISIT   Sherri Brooks, Sherri Brooks  DOB:  June 20, 1946                                       07/26/2010  EAVWU#:[id number]   I dictated a full history and physical on the hospital line.   She is being admitted with a gangrenous foot wound on the left for a fem-  pop bypass and possible forefoot amputation.     Di Kindle. Edilia Bo, M.D.  Electronically Signed   CSD/MEDQ  D:  07/26/2010  T:  07/27/2010  Job:  [id number]

## 2011-03-01 NOTE — Procedures (Unsigned)
BYPASS GRAFT EVALUATION  INDICATION:  Follow up peripheral vascular disease.  HISTORY: Diabetes:  Yes. Cardiac:  No. Hypertension:  Yes. Smoking:  Yes. Previous Surgery:  Left femoral-to-below-knee popliteal bypass graft on 07/27/2010.  Right common iliac artery stent on 11/07/2009.  SINGLE LEVEL ARTERIAL EXAM                              RIGHT              LEFT Brachial:                    190                193 Anterior tibial:             189                193 Posterior tibial:            182                185 Peroneal: Ankle/brachial index:        0.98               1.0  PREVIOUS ABI:  Date: 11/22/2010  RIGHT:  1.08  LEFT:  1.06  LOWER EXTREMITY BYPASS GRAFT DUPLEX EXAM:  DUPLEX: 1. Elevated velocities present throughout the right common iliac     artery stent suggestive of 50% to 75% stenosis. 2. Elevated velocities suggestive of 50% to 75% stenosis in the left     common femoral artery. 3. Elevated velocities present in the left distal external iliac     artery, suggestive of 30% to 49% stenosis. 4. Elevated velocities present in the native proximal left superficial     femoral artery and profunda femoral artery suggesting >75%     stenosis.  IMPRESSION: 1. Patent left common femoral to below-knee popliteal bypass graft. 2. Stenosis involving the right common iliac artery stent and left     native arteries, as noted above. 3. Bilateral stable ankle brachial indices since previous study on     11/22/2010.  ___________________________________________ Di Kindle. Edilia Bo, M.D.  SH/MEDQ  D:  02/21/2011  T:  02/21/2011  Job:  [id number]

## 2011-03-07 ENCOUNTER — Encounter: Payer: Self-pay | Admitting: Orthopedic Surgery

## 2011-03-07 ENCOUNTER — Ambulatory Visit (INDEPENDENT_AMBULATORY_CARE_PROVIDER_SITE_OTHER): Payer: Medicare Other | Admitting: Orthopedic Surgery

## 2011-03-07 VITALS — HR 68 | Ht 64.0 in | Wt 123.6 lb

## 2011-03-07 DIAGNOSIS — M24529 Contracture, unspecified elbow: Secondary | ICD-10-CM

## 2011-03-07 NOTE — Progress Notes (Signed)
Separate x-ray report AP lateral RIGHT elbow  Reason for elbow x-ray elbow pain  Normal joint space normal bone alignment  Impression normal x-ray

## 2011-03-07 NOTE — Progress Notes (Signed)
Chief complaint pain RIGHT elbow  A 65 year old female who had a mild myocardial infarction was transferred to [facility] spell during her treatment her IV infiltrated in the RIGHT forearm she developed a wound.  She was treated at the wound center in [place] with packing and secondary intention healing.  The wound healed but she has a large scar in the antecubital fossa   Complete review of systems was performed positives include unexplained weight loss fatigue blurred vision shortness of breath heartburn constipation dizziness anxiety depression  Medications were not brought with the patient despite our instructions she is on Plavix aspirin naproxen a pain medication insulin lisinopril the other wounds we can read and she doesn't know the names.  Social history she smokes a pack cigarettes a day doesn't drink completed her high school education  Exam vital signs as recorded general appearance frame is small.  She joined a x3 she is normal in terms of mood and affect she has a normal ambulation pattern her RIGHT elbow has a large scar on the front she has decreased elbow extension normal flexion pronation supination elbow stable strength is normal she has a negative sign for epicondylitis.  Pulse and temperature in the RIGHT upper extremity is normal as is the sensation  Her x-ray was negative  Recommend physical therapy no surgical treatment needed

## 2011-03-19 ENCOUNTER — Ambulatory Visit (HOSPITAL_COMMUNITY): Payer: Medicare Other | Admitting: Specialist

## 2011-07-26 LAB — URINALYSIS, ROUTINE W REFLEX MICROSCOPIC
Nitrite: NEGATIVE
Urobilinogen, UA: 1

## 2011-07-26 LAB — BASIC METABOLIC PANEL
Chloride: 108
GFR calc Af Amer: >60
GFR calc non Af Amer: 53 — ABNORMAL LOW
Potassium: 2.8 — ABNORMAL LOW

## 2011-07-26 LAB — URINE MICROSCOPIC-ADD ON

## 2011-07-26 LAB — DIFFERENTIAL
Eosinophils Absolute: 0.1
Eosinophils Relative: 1
Lymphocytes Relative: 35
Lymphs Abs: 3.7 — ABNORMAL HIGH
Monocytes Relative: 9
Neutrophils Relative %: 55

## 2011-07-26 LAB — CBC
HCT: 35.9 — ABNORMAL LOW
MCV: 95.1
RBC: 3.78 — ABNORMAL LOW
WBC: 10.7 — ABNORMAL HIGH

## 2011-07-26 LAB — URINE CULTURE

## 2011-08-22 ENCOUNTER — Other Ambulatory Visit (HOSPITAL_COMMUNITY): Payer: Self-pay | Admitting: Physician Assistant

## 2011-08-22 DIAGNOSIS — Z139 Encounter for screening, unspecified: Secondary | ICD-10-CM

## 2011-08-22 LAB — COMPREHENSIVE METABOLIC PANEL
CO2: 25 mmol/L
Potassium: 3.8 mmol/L
Sodium: 140 mmol/L (ref 137–147)

## 2011-08-28 ENCOUNTER — Telehealth: Payer: Self-pay

## 2011-08-28 NOTE — Telephone Encounter (Signed)
LMOM to call.

## 2011-08-30 ENCOUNTER — Ambulatory Visit (HOSPITAL_COMMUNITY)
Admission: RE | Admit: 2011-08-30 | Discharge: 2011-08-30 | Disposition: A | Discharge status: Home / Self Care | Payer: PRIVATE HEALTH INSURANCE | Source: Ambulatory Visit | Attending: Physician Assistant | Admitting: Physician Assistant

## 2011-08-30 DIAGNOSIS — Z780 Asymptomatic menopausal state: Secondary | ICD-10-CM | POA: Insufficient documentation

## 2011-08-30 DIAGNOSIS — Z139 Encounter for screening, unspecified: Secondary | ICD-10-CM

## 2011-08-30 DIAGNOSIS — M899 Disorder of bone, unspecified: Secondary | ICD-10-CM | POA: Insufficient documentation

## 2011-09-17 ENCOUNTER — Encounter: Payer: Self-pay | Admitting: Internal Medicine

## 2011-09-17 NOTE — Telephone Encounter (Signed)
Mailing a letter to call.  

## 2011-09-18 ENCOUNTER — Ambulatory Visit (INDEPENDENT_AMBULATORY_CARE_PROVIDER_SITE_OTHER): Payer: PRIVATE HEALTH INSURANCE | Admitting: Urgent Care

## 2011-09-18 ENCOUNTER — Encounter: Payer: Self-pay | Admitting: Urgent Care

## 2011-09-18 DIAGNOSIS — R6881 Early satiety: Secondary | ICD-10-CM

## 2011-09-18 DIAGNOSIS — R630 Anorexia: Secondary | ICD-10-CM | POA: Insufficient documentation

## 2011-09-18 DIAGNOSIS — R634 Abnormal weight loss: Secondary | ICD-10-CM

## 2011-09-18 NOTE — Progress Notes (Signed)
Referring Provider: [name]., MD Primary Care Physician:  [name]., MD Primary Gastroenterologist:  Dr. Darrick Penna  Chief Complaint  Patient presents with  . Colonoscopy    HPI:  Sherri Brooks is a 65 y.o. female here as a referral from [name] for screening colonoscopy.  She was brought into the office prior to the procedure to discuss management of her aspirin & plavix for her procedures.  She has occasional intermittent constipation chronically & takes OTC stool softeners if needed.   Wt stable now, but lost 60# in past 2 yrs, eating a lot less since surgery last yr w/ fem-pop bypass.  Denies any diarrhea, rectal bleeding, melena.  C/o anorexia & early satiety. "I just don't get hungry & when I do, I eat a few bites & feel full."  Hx chronic GERD, not on PPI.  Few GERD symptoms in past 2 yrs since wt loss.  Denies dysphagia or odynophagia.   Past Medical History  Diagnosis Date  . Diabetes mellitus   . Hypertension   . Hyperlipidemia   . PVD (peripheral vascular disease)   . Cellulitis of buttock, left 2010  . Arthritis   . GERD (gastroesophageal reflux disease)   . CAD (coronary artery disease)   . MI (myocardial infarction)     Past Surgical History  Procedure Date  . Femoral-popliteal bypass graft 07/2010  . Right common iliac stent 10/2009    Dr Durwin Nora ([place])  . Tonsillectomy   . S/p hysterecotmy     partial  . Foot amputation 2010    left    Current Outpatient Prescriptions  Medication Sig Dispense Refill  . alendronate (FOSAMAX) 70 MG tablet Take 70 mg by mouth every 7 (seven) days.       Marland Kitchen aspirin 325 MG EC tablet Take 325 mg by mouth daily.        . clopidogrel (PLAVIX) 75 MG tablet Take 75 mg by mouth daily.        Marland Kitchen ezetimibe (ZETIA) 10 MG tablet Take 10 mg by mouth daily.        . hydrochlorothiazide (HYDRODIURIL) 25 MG tablet Take 25 mg by mouth daily.        Marland Kitchen labetalol (NORMODYNE) 200 MG tablet Take 200 mg by mouth 2 (two) times daily.          Marland Kitchen lisinopril-hydrochlorothiazide (PRINZIDE,ZESTORETIC) 20-12.5 MG per tablet Take 1 tablet by mouth daily.        Marland Kitchen LORazepam (ATIVAN) 0.5 MG tablet Take 0.5 mg by mouth every 8 (eight) hours.        . metFORMIN (GLUCOPHAGE) 500 MG tablet Take 500 mg by mouth 2 (two) times daily with a meal.        . Multiple Vitamin (MULTIVITAMIN) capsule Take 1 capsule by mouth daily.        . simvastatin (ZOCOR) 40 MG tablet Take 40 mg by mouth at bedtime.         Allergies as of 09/18/2011 - Review Complete 09/18/2011  Allergen Reaction Noted  . Iohexol  01/21/2010    Family History:There is no known family history of colorectal carcinoma , liver disease, or inflammatory bowel disease.  Problem Relation Age of Onset  . Brain cancer Father   . Diabetes Mother     many family members    History   Social History  . Marital Status: Widowed    Spouse Name: N/A    Number of Children: 3  . Years  of Education: N/A   Occupational History  . retired; Psychologist, clinical    Social History Main Topics  . Smoking status: Current Everyday Smoker -- 0.5 packs/day for 20 years    Types: Cigarettes  . Smokeless tobacco: Not on file  . Alcohol Use: Yes     special occasions  . Drug Use: No  . Sexually Active: Not on file  Review of Systems: Gen: Denies any fever, chills, sweats, anorexia, fatigue, weakness, malaise, weight loss, and sleep disorder CV: Denies chest pain, angina, palpitations, syncope, orthopnea, PND, peripheral edema, and claudication. Resp: Denies dyspnea at rest, dyspnea with exercise, cough, sputum, wheezing, coughing up blood, and pleurisy. GI: Denies vomiting blood, jaundice, and fecal incontinence.   Denies dysphagia or odynophagia. GU : Denies urinary burning, blood in urine, urinary frequency, urinary hesitancy, nocturnal urination, and urinary incontinence. MS: Denies joint pain, limitation of movement, and swelling, stiffness, low back pain, extremity pain. Denies muscle  weakness, cramps, atrophy.  Derm: Denies rash, itching, dry skin, hives, moles, warts, or unhealing ulcers.  Psych: Denies depression, anxiety, memory loss, suicidal ideation, hallucinations, paranoia, and confusion. Heme: Denies bruising, bleeding, and enlarged lymph nodes.  Physical Exam: BP 114/61  Pulse 87  Temp(Src) 97.4 F (36.3 C) (Temporal)  Ht 5\' 2"  (1.575 m)  Wt 125 lb (56.7 kg)  BMI 22.86 kg/m2 General:   Alert,  Well-developed, well-nourished, pleasant and cooperative in NAD Head:  Normocephalic and atraumatic. Eyes:  Sclera clear, no icterus.   Conjunctiva pink. Ears:  Normal auditory acuity. Nose:  No deformity, discharge,  or lesions. Mouth:  No deformity or lesions, oropharynx pink & moist. Neck:  Supple; no masses or thyromegaly. Lungs:  Clear throughout to auscultation.   No wheezes, crackles, or rhonchi. No acute distress. Heart:  Regular rate and rhythm; 1/6 murmur.  No clicks, rubs,  or gallops. Abdomen:  Soft, nontender and nondistended. No masses, hepatosplenomegaly or hernias noted. Normal bowel sounds, without guarding, and without rebound.   Rectal:  Deferred until time of colonoscopy.   Msk:  Symmetrical without gross deformities. Normal posture. Pulses:  Normal pulses noted. Extremities:  Without clubbing or edema. Neurologic:  Alert and  oriented x4;  grossly normal neurologically. Skin:  Intact without significant lesions or rashes. Cervical Nodes:  No significant cervical adenopathy. Psych:  Alert and cooperative. Normal mood and affect.

## 2011-09-18 NOTE — Patient Instructions (Signed)
We will call you with instructions regarding your aspirin and Plavix for your upcoming procedures. You need a colonoscopy and EGD with Dr. Darrick Penna Call 1-800-quit-now for help with quitting smoking.

## 2011-09-19 ENCOUNTER — Encounter: Payer: Self-pay | Admitting: Urgent Care

## 2011-09-19 NOTE — Assessment & Plan Note (Signed)
Sherri Brooks is a pleasant 65 y.o. female sent over to set up colonoscopy.  Upon further history, she has had quite a significant unintentional weight loss along with anorexia & early satiety.  It would benefit her to have further evaluation with EGD at the same time as colonoscopy to r/o occult malignancy, PUD, h pylori.  I have discussed risks & benefits which include, but are not limited to, bleeding, infection, perforation & drug reaction.  The patient agrees with this plan & written consent will be obtained.    She is on plavix & Aspirin & will discuss management of this w/ Dr Darrick Penna prior to her procedures.

## 2011-09-19 NOTE — Assessment & Plan Note (Signed)
See "weight loss"

## 2011-09-19 NOTE — Assessment & Plan Note (Signed)
See Wt loss

## 2011-09-20 ENCOUNTER — Ambulatory Visit (INDEPENDENT_AMBULATORY_CARE_PROVIDER_SITE_OTHER): Payer: PRIVATE HEALTH INSURANCE | Admitting: *Deleted

## 2011-09-20 ENCOUNTER — Other Ambulatory Visit (INDEPENDENT_AMBULATORY_CARE_PROVIDER_SITE_OTHER): Payer: PRIVATE HEALTH INSURANCE | Admitting: *Deleted

## 2011-09-20 DIAGNOSIS — I70269 Atherosclerosis of native arteries of extremities with gangrene, unspecified extremity: Secondary | ICD-10-CM

## 2011-09-20 DIAGNOSIS — Z48812 Encounter for surgical aftercare following surgery on the circulatory system: Secondary | ICD-10-CM

## 2011-09-20 NOTE — Progress Notes (Signed)
Cc to PCP 

## 2011-09-21 NOTE — Progress Notes (Signed)
TCS/EGD ON PLAVIX/ASA MOVI PREP SPLIT DOSING, REGULAR BREAKFAST. CLEAR LIQUIDS AFTER 9 AM.   NEEDS PEDS SCOPE

## 2011-09-24 ENCOUNTER — Encounter: Payer: Self-pay | Admitting: Gastroenterology

## 2011-09-24 NOTE — Progress Notes (Signed)
noted 

## 2011-09-24 NOTE — Progress Notes (Signed)
LM for return call from pt to discuss ASA/plavix.  Dr Darrick Penna would like her to remain on both for procedures.   I can discuss further w/ pt when she calls.

## 2011-09-24 NOTE — Progress Notes (Signed)
Do I need to instruct her to hold her plavix

## 2011-09-27 ENCOUNTER — Other Ambulatory Visit: Payer: Self-pay | Admitting: Vascular Surgery

## 2011-09-27 DIAGNOSIS — I739 Peripheral vascular disease, unspecified: Secondary | ICD-10-CM

## 2011-09-27 DIAGNOSIS — Z48812 Encounter for surgical aftercare following surgery on the circulatory system: Secondary | ICD-10-CM

## 2011-09-27 DIAGNOSIS — I70269 Atherosclerosis of native arteries of extremities with gangrene, unspecified extremity: Secondary | ICD-10-CM

## 2011-09-27 NOTE — Progress Notes (Signed)
We did discuss the fact that she is at a slightly higher risk of GI bleeding given the fact that she will remain on Aspirin & plavix for her procedure, however the benefits of remaining on the medications outweigh the risk of bleeding. We discussed the life threatening nature of an embolic event. She agrees to remain on asprin & plavix for this procedure. She also understands that a second procedure may be required since she is on the medications if she shows signs of significant bleeding or is in need of significant intervention that cannot be performed while on the medications. Other risks discussed include reaction to the medication, perforation, and infection. She agrees with all the above and consent will be obtained.  [name]-pt needs prep as per SLF Thanks

## 2011-10-03 ENCOUNTER — Encounter: Payer: Self-pay | Admitting: Vascular Surgery

## 2011-10-03 NOTE — Procedures (Unsigned)
BYPASS GRAFT EVALUATION  INDICATION:  Left bypass graft.  HISTORY: Diabetes:  Yes. Cardiac:  No. Hypertension:  Yes. Smoking:  Yes. Previous Surgery:  Left fem-pop bypass graft on 07/27/2010.  Right CIA stent on 11/07/2009.  SINGLE LEVEL ARTERIAL EXAM                              RIGHT              LEFT Brachial: Anterior tibial: Posterior tibial: Peroneal: Ankle/brachial index:  PREVIOUS ABI:  Date:  RIGHT:  LEFT:  LOWER EXTREMITY BYPASS GRAFT DUPLEX EXAM:  DUPLEX:  Biphasic Doppler waveforms noted throughout the left lower extremity bypass graft and its native vessels with no increase in velocity noted.  IMPRESSION: 1. Patent left femoropopliteal bypass graft with no evidence of     stenosis. 2. Bilateral ankle brachial indices are noted on a separate report.  ___________________________________________ Di Kindle. Edilia Bo, M.D.  CH/MEDQ  D:  09/25/2011  T:  09/25/2011  Job:  [id number]

## 2011-10-04 ENCOUNTER — Encounter (HOSPITAL_COMMUNITY): Payer: Self-pay | Admitting: Pharmacy Technician

## 2011-10-18 MED ORDER — SODIUM CHLORIDE 0.45 % IV SOLN
Freq: Once | INTRAVENOUS | Status: AC
  Administered 2011-10-19: 10:00:00 via INTRAVENOUS

## 2011-10-19 ENCOUNTER — Encounter (HOSPITAL_COMMUNITY)
Admission: RE | Disposition: A | Discharge status: Home / Self Care | Payer: Self-pay | Source: Ambulatory Visit | Attending: Gastroenterology

## 2011-10-19 ENCOUNTER — Ambulatory Visit (HOSPITAL_COMMUNITY)
Admission: RE | Admit: 2011-10-19 | Discharge: 2011-10-19 | Disposition: A | Discharge status: Home / Self Care | Payer: PRIVATE HEALTH INSURANCE | Source: Ambulatory Visit | Attending: Gastroenterology | Admitting: Gastroenterology

## 2011-10-19 ENCOUNTER — Other Ambulatory Visit: Payer: Self-pay | Admitting: Gastroenterology

## 2011-10-19 DIAGNOSIS — I10 Essential (primary) hypertension: Secondary | ICD-10-CM | POA: Insufficient documentation

## 2011-10-19 DIAGNOSIS — K648 Other hemorrhoids: Secondary | ICD-10-CM | POA: Insufficient documentation

## 2011-10-19 DIAGNOSIS — R6881 Early satiety: Secondary | ICD-10-CM

## 2011-10-19 DIAGNOSIS — Z79899 Other long term (current) drug therapy: Secondary | ICD-10-CM | POA: Insufficient documentation

## 2011-10-19 DIAGNOSIS — R634 Abnormal weight loss: Secondary | ICD-10-CM | POA: Insufficient documentation

## 2011-10-19 DIAGNOSIS — K2940 Chronic atrophic gastritis without bleeding: Secondary | ICD-10-CM | POA: Insufficient documentation

## 2011-10-19 DIAGNOSIS — Z7982 Long term (current) use of aspirin: Secondary | ICD-10-CM | POA: Insufficient documentation

## 2011-10-19 DIAGNOSIS — K2970 Gastritis, unspecified, without bleeding: Secondary | ICD-10-CM

## 2011-10-19 DIAGNOSIS — D126 Benign neoplasm of colon, unspecified: Secondary | ICD-10-CM | POA: Insufficient documentation

## 2011-10-19 DIAGNOSIS — R630 Anorexia: Secondary | ICD-10-CM

## 2011-10-19 DIAGNOSIS — K449 Diaphragmatic hernia without obstruction or gangrene: Secondary | ICD-10-CM | POA: Insufficient documentation

## 2011-10-19 DIAGNOSIS — E785 Hyperlipidemia, unspecified: Secondary | ICD-10-CM | POA: Insufficient documentation

## 2011-10-19 DIAGNOSIS — K2990 Gastroduodenitis, unspecified, without bleeding: Secondary | ICD-10-CM

## 2011-10-19 DIAGNOSIS — E119 Type 2 diabetes mellitus without complications: Secondary | ICD-10-CM | POA: Insufficient documentation

## 2011-10-19 SURGERY — COLONOSCOPY WITH ESOPHAGOGASTRODUODENOSCOPY (EGD)
Anesthesia: Moderate Sedation

## 2011-10-19 MED ORDER — MIDAZOLAM HCL 5 MG/5ML IJ SOLN
INTRAMUSCULAR | Status: DC | PRN
  Administered 2011-10-19: 1 mg via INTRAVENOUS
  Administered 2011-10-19 (×2): 2 mg via INTRAVENOUS
  Administered 2011-10-19 (×2): 1 mg via INTRAVENOUS

## 2011-10-19 MED ORDER — STERILE WATER FOR IRRIGATION IR SOLN
Status: DC | PRN
  Administered 2011-10-19: 10:00:00

## 2011-10-19 MED ORDER — MEPERIDINE HCL 100 MG/ML IJ SOLN
INTRAMUSCULAR | Status: AC
  Filled 2011-10-19: qty 1

## 2011-10-19 MED ORDER — MIDAZOLAM HCL 5 MG/5ML IJ SOLN
INTRAMUSCULAR | Status: AC
  Filled 2011-10-19: qty 10

## 2011-10-19 MED ORDER — MEPERIDINE HCL 100 MG/ML IJ SOLN
INTRAMUSCULAR | Status: DC | PRN
  Administered 2011-10-19 (×3): 25 mg via INTRAVENOUS

## 2011-10-19 NOTE — Interval H&P Note (Signed)
History and Physical Interval Note:  10/19/2011 9:58 AM  Sherri Brooks  has presented today for surgery, with the diagnosis of CONSTIPATION, WT LOSS, & ANOREXIA  The various methods of treatment have been discussed with the patient and family. After consideration of risks, benefits and other options for treatment, the patient has consented to  Procedure(s): COLONOSCOPY WITH ESOPHAGOGASTRODUODENOSCOPY (EGD) as a surgical intervention .  The patients' history has been reviewed, patient examined, no change in status, stable for surgery.  I have reviewed the patients' chart and labs.  Questions were answered to the patient's satisfaction.     [employer]

## 2011-10-19 NOTE — H&P (Signed)
BP Pulse Temp(Src) Ht Wt BMI    114/61  87  97.4 F (36.3 C) (Temporal)  5\' 2"  (1.575 m)  125 lb (56.7 kg)  22.86 kg/m2       Progress Notes     Lorenza Burton, NP  09/19/2011 11:35 PM  Signed Referring Provider: [name]., MD Primary Care Physician:  [name]., MD Primary Gastroenterologist:  Dr. Darrick Penna    Chief Complaint   Patient presents with   .  Colonoscopy      HPI:  Sherri Brooks is a 66 y.o. female here as a referral from [name] for screening colonoscopy.  She was brought into the office prior to the procedure to discuss management of her aspirin & plavix for her procedures.  She has occasional intermittent constipation chronically & takes OTC stool softeners if needed.   Wt stable now, but lost 60# in past 2 yrs, eating a lot less since surgery last yr w/ fem-pop bypass.  Denies any diarrhea, rectal bleeding, melena.  C/o anorexia & early satiety. "I just don't get hungry & when I do, I eat a few bites & feel full."  Hx chronic GERD, not on PPI.  Few GERD symptoms in past 2 yrs since wt loss.  Denies dysphagia or odynophagia.    Past Medical History   Diagnosis  Date   .  Diabetes mellitus     .  Hypertension     .  Hyperlipidemia     .  PVD (peripheral vascular disease)     .  Cellulitis of buttock, left  2010   .  Arthritis     .  GERD (gastroesophageal reflux disease)     .  CAD (coronary artery disease)     .  MI (myocardial infarction)         Past Surgical History   Procedure  Date   .  Femoral-popliteal bypass graft  07/2010   .  Right common iliac stent  10/2009       Dr Durwin Nora ([place])   .  Tonsillectomy     .  S/p hysterecotmy         partial   .  Foot amputation  2010       left       Current Outpatient Prescriptions   Medication  Sig  Dispense  Refill   .  alendronate (FOSAMAX) 70 MG tablet  Take 70 mg by mouth every 7 (seven) days.          Marland Kitchen  aspirin 325 MG EC tablet  Take 325 mg by mouth daily.           .  clopidogrel  (PLAVIX) 75 MG tablet  Take 75 mg by mouth daily.           Marland Kitchen  ezetimibe (ZETIA) 10 MG tablet  Take 10 mg by mouth daily.           .  hydrochlorothiazide (HYDRODIURIL) 25 MG tablet  Take 25 mg by mouth daily.           Marland Kitchen  labetalol (NORMODYNE) 200 MG tablet  Take 200 mg by mouth 2 (two) times daily.           Marland Kitchen  lisinopril-hydrochlorothiazide (PRINZIDE,ZESTORETIC) 20-12.5 MG per tablet  Take 1 tablet by mouth daily.           Marland Kitchen  LORazepam (ATIVAN) 0.5 MG tablet  Take  0.5 mg by mouth every 8 (eight) hours.           .  metFORMIN (GLUCOPHAGE) 500 MG tablet  Take 500 mg by mouth 2 (two) times daily with a meal.           .  Multiple Vitamin (MULTIVITAMIN) capsule  Take 1 capsule by mouth daily.           .  simvastatin (ZOCOR) 40 MG tablet  Take 40 mg by mouth at bedtime.              Allergies as of 09/18/2011 - Review Complete 09/18/2011   Allergen  Reaction  Noted   .  Iohexol    01/21/2010       Family History:There is no known family history of colorectal carcinoma , liver disease, or inflammatory bowel disease.   Problem  Relation  Age of Onset   .  Brain cancer  Father     .  Diabetes  Mother         many family members         History       Social History   .  Marital Status:  Widowed       Spouse Name:  N/A       Number of Children:  3   .  Years of Education:  N/A       Occupational History   .  retired; Psychologist, clinical         Social History Main Topics   .  Smoking status:  Current Everyday Smoker -- 0.5 packs/day for 20 years       Types:  Cigarettes   .  Smokeless tobacco:  Not on file   .  Alcohol Use:  Yes         special occasions   .  Drug Use:  No   .  Sexually Active:  Not on file    Review of Systems: Gen: Denies any fever, chills, sweats, anorexia, fatigue, weakness, malaise, weight loss, and sleep disorder CV: Denies chest pain, angina, palpitations, syncope, orthopnea, PND, peripheral edema, and claudication. Resp: Denies dyspnea at rest,  dyspnea with exercise, cough, sputum, wheezing, coughing up blood, and pleurisy. GI: Denies vomiting blood, jaundice, and fecal incontinence.   Denies dysphagia or odynophagia. GU : Denies urinary burning, blood in urine, urinary frequency, urinary hesitancy, nocturnal urination, and urinary incontinence. MS: Denies joint pain, limitation of movement, and swelling, stiffness, low back pain, extremity pain. Denies muscle weakness, cramps, atrophy.   Derm: Denies rash, itching, dry skin, hives, moles, warts, or unhealing ulcers.   Psych: Denies depression, anxiety, memory loss, suicidal ideation, hallucinations, paranoia, and confusion. Heme: Denies bruising, bleeding, and enlarged lymph nodes.   Physical Exam: BP 114/61  Pulse 87  Temp(Src) 97.4 F (36.3 C) (Temporal)  Ht 5\' 2"  (1.575 m)  Wt 125 lb (56.7 kg)  BMI 22.86 kg/m2 General:   Alert,  Well-developed, well-nourished, pleasant and cooperative in NAD Head:  Normocephalic and atraumatic. Eyes:  Sclera clear, no icterus.   Conjunctiva pink. Ears:  Normal auditory acuity. Nose:  No deformity, discharge,  or lesions. Mouth:  No deformity or lesions, oropharynx pink & moist. Neck:  Supple; no masses or thyromegaly. Lungs:  Clear throughout to auscultation.   No wheezes, crackles, or rhonchi. No acute distress. Heart:  Regular rate and rhythm; 1/6 murmur.  No clicks, rubs,  or gallops. Abdomen:  Soft, nontender and  nondistended. No masses, hepatosplenomegaly or hernias noted. Normal bowel sounds, without guarding, and without rebound.    Rectal:  Deferred until time of colonoscopy.    Msk:  Symmetrical without gross deformities. Normal posture. Pulses:  Normal pulses noted. Extremities:  Without clubbing or edema. Neurologic:  Alert and  oriented x4;  grossly normal neurologically. Skin:  Intact without significant lesions or rashes. Cervical Nodes:  No significant cervical adenopathy. Psych:  Alert and cooperative. Normal mood and  affect.     [name]  09/20/2011  9:54 AM  Signed Cc to PCP  [name], MD  09/21/2011  3:18 PM  Signed TCS/EGD ON PLAVIX/ASA MOVI PREP SPLIT DOSING, REGULAR BREAKFAST. CLEAR LIQUIDS AFTER 9 AM.    NEEDS PEDS SCOPE     Cherene Julian [facility]  09/24/2011 11:42 AM  Signed Do I need to instruct her to hold her plavix    Lorenza Burton, NP  09/24/2011 12:19 PM  Signed LM for return call from pt to discuss ASA/plavix.  Dr Darrick Penna would like her to remain on both for procedures.    I can discuss further w/ pt when she calls.         Cherene Julian [facility]  09/24/2011 12:33 PM  Signed noted  Lorenza Burton, NP  09/27/2011  9:46 AM  Signed We did discuss the fact that she is at a slightly higher risk of GI bleeding given the fact that she will remain on Aspirin & plavix for her procedure, however the benefits of remaining on the medications outweigh the risk of bleeding. We discussed the life threatening nature of an embolic event. She agrees to remain on asprin & plavix for this procedure. She also understands that a second procedure may be required since she is on the medications if she shows signs of significant bleeding or is in need of significant intervention that cannot be performed while on the medications. Other risks discussed include reaction to the medication, perforation, and infection. She agrees with all the above and consent will be obtained.   [name]-pt needs prep as per SLF Thanks        Weight loss, unintentional Lorenza Burton, NP  09/19/2011 11:33 PM  Signed Sherri Brooks is a pleasant 66 y.o. female sent over to set up colonoscopy.  Upon further history, she has had quite a significant unintentional weight loss along with anorexia & early satiety.  It would benefit her to have further evaluation with EGD at the same time as colonoscopy to r/o occult malignancy, PUD, h pylori.  I have discussed risks & benefits which include, but are not limited to, bleeding,  infection, perforation & drug reaction.  The patient agrees with this plan & written consent will be obtained.     She is on plavix & Aspirin & will discuss management of this w/ Dr Darrick Penna prior to her procedures.       Early satiety Lorenza Burton, NP  09/19/2011 11:34 PM  Signed See Wt loss  Anorexia Lorenza Burton, NP  09/19/2011 11:34 PM  Signed See weight loss

## 2011-10-19 NOTE — Op Note (Signed)
[facility] [address] [place], [place]  [address]  COLONOSCOPY PROCEDURE REPORT  PATIENT:  Sherri, Brooks  MR#:  [id number] BIRTHDATE:  December 10, 1945, 65 yrs. old  GENDER:  female  ENDOSCOPIST:  [name], MD REF. BY:  Artis Delay, M.D. ASSISTANT:  PROCEDURE DATE:  10/19/2011 PROCEDURE:  ILEOColonoscopy with biopsy  INDICATIONS:  weight loss, anorexia-PT ATE REG DIET UNTIL 5 PM YETERDAY GN:FAOZH  MEDICATIONS:   Demerol 50 mg IV, Versed 6 mg IV  DESCRIPTION OF PROCEDURE:    Physical exam was performed. Informed consent was obtained from the patient after explaining the benefits, risks, and alternatives to procedure.  The patient was connected to monitor and placed in left lateral position. Continuous oxygen was provided by nasal cannula and IV medicine administered through an indwelling cannula.  After administration of sedation and rectal exam, the patient's rectum was intubated and the EC-3890LI ([id number]) and EC-3490Li ([id number]) colonoscope was advanced under direct visualization to the ILEUM. The scope was removed slowly by carefully examining the color, texture, anatomy, and integrity mucosa on the way out.  The patient was recovered in endoscopy and discharged home in satisfactory condition. <<PROCEDUREIMAGES>>  FINDINGS:  A  4 MM sessile polyp was found at the splenic flexure & REMOVED VIA COLD FORCEPS.  SMALL Internal Hemorrhoids were found.  PREP QUALITY: POOR DUE TO LARGE LIQUID/RETAINED MATTER CECAL W/D TIME:     21 minutes  COMPLICATIONS:    None  ENDOSCOPIC IMPRESSION: 1) Sessile polyp at the splenic flexure 2) Internal hemorrhoids  RECOMMENDATIONS: INCOMPLETE STUDY DUE TO POOR BOWEL PREP, TCS Oct 26, 2011 WITH OVERTUBE/PEDS SCOPE AWAIT BIOPSIES HIGH FIBER DIET  REPEAT EXAM:  No  ______________________________ [name], MD  CC:  Artis Delay, M.D.  n. eSIGNED:   Lavaun Greenfield at 10/19/2011 02:59 PM  [name],  [phone]

## 2011-10-19 NOTE — Op Note (Signed)
[facility] [address] [place], [place]  [address]  ENDOSCOPY PROCEDURE REPORT  PATIENT:  Sherri Brooks, Sherri Brooks  MR#:  [id number] BIRTHDATE:  December 03, 1945, 65 yrs. old  GENDER:  female  ENDOSCOPIST:  [name], MD Referred by:  Artis Delay, M.D.  PROCEDURE DATE:  10/19/2011 PROCEDURE:  EGD with biopsy, [address] ASA CLASS: INDICATIONS:  WEIGHT LOSS, ANOREXIA  MEDICATIONS:   TCS + Demerol 25 mg IV, Versed 1 mg IV TOPICAL ANESTHETIC:  Cetacaine Spray  DESCRIPTION OF PROCEDURE:     Physical exam was performed. Informed consent was obtained from the patient after explaining the benefits, risks, and alternatives to the procedure.  The patient was connected to the monitor and placed in the left lateral position.  Continuous oxygen was provided by nasal cannula and IV medicine administered through an indwelling cannula.  After administration of sedation, the patient's esophagus was intubated and the EC-3490Li ([id number]), EG-2990i ([id number]) and EC-3890Li ([id number]) endoscope was advanced under direct visualization to the second portion of the duodenum.  The scope was removed slowly by carefully examining the color, texture, anatomy, and integrity of the mucosa on the way out.  The patient was recovered in endoscopy and discharged home in satisfactory condition. <<PROCEDUREIMAGES>>  A 2-3 CM hiatal hernia was found.  Mild EROSIVE gastritis was found & BIOPSIED VIA COLD FORCEPS. NL DUODENUM.  COMPLICATIONS:    None  ENDOSCOPIC IMPRESSION: 1) Hiatal hernia 2) Mild gastritis RECOMMENDATIONS: BID PPI AWAIT BIOPSIES NEEDS CT A/P IF NEG H PYLORI OPV IN 4 MOS  REPEAT EXAM:  No  ______________________________ [name], MD  CC:  n. eSIGNED:   Jaimey Franchini at 10/19/2011 03:27 PM  [name], [phone]

## 2011-10-22 ENCOUNTER — Other Ambulatory Visit: Payer: Self-pay | Admitting: Gastroenterology

## 2011-10-22 DIAGNOSIS — R634 Abnormal weight loss: Secondary | ICD-10-CM

## 2011-10-22 LAB — GLUCOSE, CAPILLARY: Glucose-Capillary: 128 mg/dL — ABNORMAL HIGH (ref 70–99)

## 2011-10-22 MED ORDER — PEG-KCL-NACL-NASULF-NA ASC-C 100 G PO SOLR: 1.0000 | Freq: Once | ORAL | Status: DC

## 2011-10-23 ENCOUNTER — Telehealth: Payer: Self-pay | Admitting: Gastroenterology

## 2011-10-23 NOTE — Telephone Encounter (Signed)
Please call pt. She had A simple adenoma removed from her colon. SHE NEEDS TO COMPLETE HER TCS IN JAN 2013. HER stomach Bx shows mild gastritis FROM ASA USE. Continue OMP BID. PT SHOULD ADHERE TO A CLEAR LIQUID DIET PRIOR TO PROCEDURE. CLEAR LIQUIDS TO BEGIN WITH SUPPER 2 NIGHTS BEFORE HER EXAM. SHE WILL NEED A CT OF THE ABDOMEN AND PELVIS (W/ IV AND ORAL CONTRAST)TO COMPLETE HER WORKUP FOR UNINTENTIONAL WEIGHT LOSS. She will meed pre-med for exam due to itching in the past. She will need Prednisone 50 mg po 13 hours, 7 hours, and 1 hour prior to her exam, #qs rfx0. She needs Benadryl 50 mg po 1 hour prior to her study. The steroids can cause an elevated blood sugar. SHe should drink fluids to keep her urine light yellow and call if she has any questions.

## 2011-10-24 ENCOUNTER — Other Ambulatory Visit: Payer: Self-pay | Admitting: Gastroenterology

## 2011-10-24 ENCOUNTER — Other Ambulatory Visit: Payer: Self-pay

## 2011-10-24 ENCOUNTER — Telehealth: Payer: Self-pay

## 2011-10-24 DIAGNOSIS — R634 Abnormal weight loss: Secondary | ICD-10-CM

## 2011-10-24 NOTE — Telephone Encounter (Signed)
Tried to call pt. VM is full. Routing to [employer] also. ( Please let me know when you schedule CT so I can call in the meds for pt.

## 2011-10-24 NOTE — Telephone Encounter (Signed)
Called. VM full. Will try again.

## 2011-10-24 NOTE — Telephone Encounter (Signed)
Opened in error

## 2011-10-24 NOTE — Telephone Encounter (Signed)
Called and VM full. Per [name] and Selena Batten pt has been scheduled for Friday 10/26/2011 and has already received her instructions. I will give pt the other info when she calls.

## 2011-10-24 NOTE — Telephone Encounter (Signed)
Results Cc to PCP  

## 2011-10-25 ENCOUNTER — Encounter (HOSPITAL_COMMUNITY): Payer: Self-pay | Admitting: Pharmacy Technician

## 2011-10-25 MED ORDER — SODIUM CHLORIDE 0.45 % IV SOLN
Freq: Once | INTRAVENOUS | Status: AC
  Administered 2011-10-26: 14:00:00 via INTRAVENOUS

## 2011-10-25 NOTE — Telephone Encounter (Signed)
Spoke to pt. Informed of the results. If she has to have a CT, she is aware that she will need medication and will let me know to call it in.

## 2011-10-26 ENCOUNTER — Encounter (HOSPITAL_COMMUNITY): Payer: Self-pay | Admitting: *Deleted

## 2011-10-26 ENCOUNTER — Other Ambulatory Visit: Payer: Self-pay | Admitting: Gastroenterology

## 2011-10-26 ENCOUNTER — Encounter (HOSPITAL_COMMUNITY)
Admission: RE | Disposition: A | Discharge status: Home / Self Care | Payer: Self-pay | Source: Ambulatory Visit | Attending: Gastroenterology

## 2011-10-26 ENCOUNTER — Ambulatory Visit (HOSPITAL_COMMUNITY)
Admission: RE | Admit: 2011-10-26 | Discharge: 2011-10-26 | Disposition: A | Discharge status: Home / Self Care | Payer: PRIVATE HEALTH INSURANCE | Source: Ambulatory Visit | Attending: Gastroenterology | Admitting: Gastroenterology

## 2011-10-26 DIAGNOSIS — E119 Type 2 diabetes mellitus without complications: Secondary | ICD-10-CM | POA: Insufficient documentation

## 2011-10-26 DIAGNOSIS — I10 Essential (primary) hypertension: Secondary | ICD-10-CM | POA: Insufficient documentation

## 2011-10-26 DIAGNOSIS — K621 Rectal polyp: Secondary | ICD-10-CM

## 2011-10-26 DIAGNOSIS — K620 Anal polyp: Secondary | ICD-10-CM

## 2011-10-26 DIAGNOSIS — Z01812 Encounter for preprocedural laboratory examination: Secondary | ICD-10-CM | POA: Insufficient documentation

## 2011-10-26 DIAGNOSIS — R634 Abnormal weight loss: Secondary | ICD-10-CM

## 2011-10-26 DIAGNOSIS — D128 Benign neoplasm of rectum: Secondary | ICD-10-CM | POA: Insufficient documentation

## 2011-10-26 DIAGNOSIS — Z79899 Other long term (current) drug therapy: Secondary | ICD-10-CM | POA: Insufficient documentation

## 2011-10-26 DIAGNOSIS — Z7982 Long term (current) use of aspirin: Secondary | ICD-10-CM | POA: Insufficient documentation

## 2011-10-26 DIAGNOSIS — E785 Hyperlipidemia, unspecified: Secondary | ICD-10-CM | POA: Insufficient documentation

## 2011-10-26 DIAGNOSIS — D129 Benign neoplasm of anus and anal canal: Secondary | ICD-10-CM | POA: Insufficient documentation

## 2011-10-26 HISTORY — PX: COLONOSCOPY: SHX5424

## 2011-10-26 SURGERY — COLONOSCOPY
Anesthesia: Moderate Sedation

## 2011-10-26 MED ORDER — MEPERIDINE HCL 100 MG/ML IJ SOLN
INTRAMUSCULAR | Status: DC | PRN
  Administered 2011-10-26 (×2): 25 mg via INTRAVENOUS

## 2011-10-26 MED ORDER — MEPERIDINE HCL 100 MG/ML IJ SOLN
INTRAMUSCULAR | Status: AC
  Filled 2011-10-26: qty 1

## 2011-10-26 MED ORDER — MIDAZOLAM HCL 5 MG/5ML IJ SOLN
INTRAMUSCULAR | Status: DC | PRN
  Administered 2011-10-26: 2 mg via INTRAVENOUS
  Administered 2011-10-26 (×2): 1 mg via INTRAVENOUS
  Administered 2011-10-26: 2 mg via INTRAVENOUS

## 2011-10-26 MED ORDER — MIDAZOLAM HCL 5 MG/5ML IJ SOLN
INTRAMUSCULAR | Status: AC
  Filled 2011-10-26: qty 10

## 2011-10-26 NOTE — Interval H&P Note (Signed)
History and Physical Interval Note:  10/26/2011 12:54 PM  Sherri Brooks  has presented today for surgery, with the diagnosis of weight loss  The various methods of treatment have been discussed with the patient and family. After consideration of risks, benefits and other options for treatment, the patient has consented to  Procedure(s): COLONOSCOPY as a surgical intervention .  The patients' history has been reviewed, patient examined, no change in status, stable for surgery.  I have reviewed the patients' chart and labs.  Questions were answered to the patient's satisfaction.     [employer]

## 2011-10-26 NOTE — H&P (View-Only) (Signed)
      BP Pulse Temp(Src) Ht Wt BMI    114/61  87  97.4 F (36.3 C) (Temporal)  5' 2" (1.575 m)  125 lb (56.7 kg)  22.86 kg/m2       Progress Notes     JONES, KANDICE, NP  09/19/2011 11:35 PM  Signed Referring Provider: Fusco, Lawrence J., MD Primary Care Physician:  FUSCO,LAWRENCE J., MD Primary Gastroenterologist:  Dr. Averyanna Sax    Chief Complaint   Patient presents with   .  Colonoscopy      HPI:  Sherri Brooks is a 65 y.o. female here as a referral from Dr. Fusco for screening colonoscopy.  She was brought into the office prior to the procedure to discuss management of her aspirin & plavix for her procedures.  She has occasional intermittent constipation chronically & takes OTC stool softeners if needed.   Wt stable now, but lost 60# in past 2 yrs, eating a lot less since surgery last yr w/ fem-pop bypass.  Denies any diarrhea, rectal bleeding, melena.  C/o anorexia & early satiety. "I just don't get hungry & when I do, I eat a few bites & feel full."  Hx chronic GERD, not on PPI.  Few GERD symptoms in past 2 yrs since wt loss.  Denies dysphagia or odynophagia.    Past Medical History   Diagnosis  Date   .  Diabetes mellitus     .  Hypertension     .  Hyperlipidemia     .  PVD (peripheral vascular disease)     .  Cellulitis of buttock, left  2010   .  Arthritis     .  GERD (gastroesophageal reflux disease)     .  CAD (coronary artery disease)     .  MI (myocardial infarction)         Past Surgical History   Procedure  Date   .  Femoral-popliteal bypass graft  07/2010   .  Right common iliac stent  10/2009       Dr Dixon ([place])   .  Tonsillectomy     .  S/p hysterecotmy         partial   .  Foot amputation  2010       left       Current Outpatient Prescriptions   Medication  Sig  Dispense  Refill   .  alendronate (FOSAMAX) 70 MG tablet  Take 70 mg by mouth every 7 (seven) days.          .  aspirin 325 MG EC tablet  Take 325 mg by mouth daily.           .  clopidogrel  (PLAVIX) 75 MG tablet  Take 75 mg by mouth daily.           .  ezetimibe (ZETIA) 10 MG tablet  Take 10 mg by mouth daily.           .  hydrochlorothiazide (HYDRODIURIL) 25 MG tablet  Take 25 mg by mouth daily.           .  labetalol (NORMODYNE) 200 MG tablet  Take 200 mg by mouth 2 (two) times daily.           .  lisinopril-hydrochlorothiazide (PRINZIDE,ZESTORETIC) 20-12.5 MG per tablet  Take 1 tablet by mouth daily.           .  LORazepam (ATIVAN) 0.5 MG tablet  Take   0.5 mg by mouth every 8 (eight) hours.           .  metFORMIN (GLUCOPHAGE) 500 MG tablet  Take 500 mg by mouth 2 (two) times daily with a meal.           .  Multiple Vitamin (MULTIVITAMIN) capsule  Take 1 capsule by mouth daily.           .  simvastatin (ZOCOR) 40 MG tablet  Take 40 mg by mouth at bedtime.              Allergies as of 09/18/2011 - Review Complete 09/18/2011   Allergen  Reaction  Noted   .  Iohexol    01/21/2010       Family History:There is no known family history of colorectal carcinoma , liver disease, or inflammatory bowel disease.   Problem  Relation  Age of Onset   .  Brain cancer  Father     .  Diabetes  Mother         many family members         History       Social History   .  Marital Status:  Widowed       Spouse Name:  N/A       Number of Children:  3   .  Years of Education:  N/A       Occupational History   .  retired; [employer]         Social History Main Topics   .  Smoking status:  Current Everyday Smoker -- 0.5 packs/day for 20 years       Types:  Cigarettes   .  Smokeless tobacco:  Not on file   .  Alcohol Use:  Yes         special occasions   .  Drug Use:  No   .  Sexually Active:  Not on file    Review of Systems: Gen: Denies any fever, chills, sweats, anorexia, fatigue, weakness, malaise, weight loss, and sleep disorder CV: Denies chest pain, angina, palpitations, syncope, orthopnea, PND, peripheral edema, and claudication. Resp: Denies dyspnea at rest,  dyspnea with exercise, cough, sputum, wheezing, coughing up blood, and pleurisy. GI: Denies vomiting blood, jaundice, and fecal incontinence.   Denies dysphagia or odynophagia. GU : Denies urinary burning, blood in urine, urinary frequency, urinary hesitancy, nocturnal urination, and urinary incontinence. MS: Denies joint pain, limitation of movement, and swelling, stiffness, low back pain, extremity pain. Denies muscle weakness, cramps, atrophy.   Derm: Denies rash, itching, dry skin, hives, moles, warts, or unhealing ulcers.   Psych: Denies depression, anxiety, memory loss, suicidal ideation, hallucinations, paranoia, and confusion. Heme: Denies bruising, bleeding, and enlarged lymph nodes.   Physical Exam: BP 114/61  Pulse 87  Temp(Src) 97.4 F (36.3 C) (Temporal)  Ht 5' 2" (1.575 m)  Wt 125 lb (56.7 kg)  BMI 22.86 kg/m2 General:   Alert,  Well-developed, well-nourished, pleasant and cooperative in NAD Head:  Normocephalic and atraumatic. Eyes:  Sclera clear, no icterus.   Conjunctiva pink. Ears:  Normal auditory acuity. Nose:  No deformity, discharge,  or lesions. Mouth:  No deformity or lesions, oropharynx pink & moist. Neck:  Supple; no masses or thyromegaly. Lungs:  Clear throughout to auscultation.   No wheezes, crackles, or rhonchi. No acute distress. Heart:  Regular rate and rhythm; 1/6 murmur.  No clicks, rubs,  or gallops. Abdomen:  Soft, nontender and   nondistended. No masses, hepatosplenomegaly or hernias noted. Normal bowel sounds, without guarding, and without rebound.    Rectal:  Deferred until time of colonoscopy.    Msk:  Symmetrical without gross deformities. Normal posture. Pulses:  Normal pulses noted. Extremities:  Without clubbing or edema. Neurologic:  Alert and  oriented x4;  grossly normal neurologically. Skin:  Intact without significant lesions or rashes. Cervical Nodes:  No significant cervical adenopathy. Psych:  Alert and cooperative. Normal mood and  affect.     Leigh A Watson  09/20/2011  9:54 AM  Signed Cc to PCP  Shayra Anton, MD  09/21/2011  3:18 PM  Signed TCS/EGD ON PLAVIX/ASA MOVI PREP SPLIT DOSING, REGULAR BREAKFAST. CLEAR LIQUIDS AFTER 9 AM.    NEEDS PEDS SCOPE     Davis, [name] Dawn  09/24/2011 11:42 AM  Signed Do I need to instruct her to hold her plavix    JONES, KANDICE, NP  09/24/2011 12:19 PM  Signed LM for return call from pt to discuss ASA/plavix.  Dr Zinedine Ellner would like her to remain on both for procedures.    I can discuss further w/ pt when she calls.         Davis, [name] Dawn  09/24/2011 12:33 PM  Signed noted  JONES, KANDICE, NP  09/27/2011  9:46 AM  Signed We did discuss the fact that she is at a slightly higher risk of GI bleeding given the fact that she will remain on Aspirin & plavix for her procedure, however the benefits of remaining on the medications outweigh the risk of bleeding. We discussed the life threatening nature of an embolic event. She agrees to remain on asprin & plavix for this procedure. She also understands that a second procedure may be required since she is on the medications if she shows signs of significant bleeding or is in need of significant intervention that cannot be performed while on the medications. Other risks discussed include reaction to the medication, perforation, and infection. She agrees with all the above and consent will be obtained.   [name]-pt needs prep as per SL[facility] Thanks        Weight loss, unintentional - JONES, KANDICE, NP  09/19/2011 11:33 PM  Signed Sherri Brooks is a pleasant 65 y.o. female sent over to set up colonoscopy.  Upon further history, she has had quite a significant unintentional weight loss along with anorexia & early satiety.  It would benefit her to have further evaluation with EGD at the same time as colonoscopy to r/o occult malignancy, PUD, h pylori.  I have discussed risks & benefits which include, but are not limited to, bleeding,  infection, perforation & drug reaction.  The patient agrees with this plan & written consent will be obtained.     She is on plavix & Aspirin & will discuss management of this w/ Dr Season Astacio prior to her procedures.       Early satiety - JONES, KANDICE, NP  09/19/2011 11:34 PM  Signed See Wt loss  Anorexia - JONES, KANDICE, NP  09/19/2011 11:34 PM  Signed See weight loss    

## 2011-10-26 NOTE — Op Note (Signed)
[facility] [address] [place], [place]  [address]  COLONOSCOPY PROCEDURE REPORT  PATIENT:  Sherri Brooks, Sherri Brooks  MR#:  [id number] BIRTHDATE:  Apr 30, 1946, 65 yrs. old  GENDER:  female  ENDOSCOPIST:  [name], MD REF. BY:  Artis Delay, M.D. ASSISTANT:  PROCEDURE DATE:  10/26/2011 PROCEDURE:  ILEOColonoscopy with biopsy  INDICATIONS:  UNINTENTIONAL WEIGHT LOSS POOR PREP ON TCS 1 WEEK AGO  MEDICATIONS:   Demerol 50 mg IV, Versed 6 mg IV  DESCRIPTION OF PROCEDURE:    Physical exam was performed. Informed consent was obtained from the patient after explaining the benefits, risks, and alternatives to procedure.  The patient was connected to monitor and placed in left lateral position. Continuous oxygen was provided by nasal cannula and IV medicine administered through an indwelling cannula.  After administration of sedation and rectal exam, the patient's rectum was intubated and the EC-3890Li ([id number]) and EC-3490Li ([id number]) colonoscope/OVERTUBE was advanced under direct visualization to the ILEUM.  The scope was removed slowly by carefully examining the color, texture, anatomy, and integrity mucosa on the way out. The patient was recovered in endoscopy and discharged home in satisfactory condition. <<PROCEDUREIMAGES>>  FINDINGS:  There were multiple polyps (3 MM) identified and removed in the rectum VIA COLD FORCEPS. TORTUOUS COLON. NL COLON W/O POLYPS MASSES, OR INFLAMMATORY CHANGES.  PREP QUALITY: EXCELLENT CECAL W/D TIME:    15 minutes  COMPLICATIONS:    None  ENDOSCOPIC IMPRESSION: 1) Polyps, multiple in the rectum LIKLEY HYPERPLASTIC 2) TORTUOUS/REDUNDANT TRANSVERSE COLON  RECOMMENDATIONS: TCS IN 10 YEARS WITH OVERTUBE AND PEDS SCOPE AWAIT BIOPSIES HIGH FIBER DIET COMPLETE CT A/P  REPEAT EXAM:  No  ______________________________ [name], MD  CC:  Artis Delay, M.D.  n. eSIGNED:   [name] at 10/26/2011 03:06 PM  [name],  [phone]

## 2011-10-29 ENCOUNTER — Other Ambulatory Visit: Payer: Self-pay | Admitting: Gastroenterology

## 2011-10-29 DIAGNOSIS — R634 Abnormal weight loss: Secondary | ICD-10-CM

## 2011-10-29 LAB — GLUCOSE, CAPILLARY: Glucose-Capillary: 149 mg/dL — ABNORMAL HIGH (ref 70–99)

## 2011-10-29 NOTE — Telephone Encounter (Signed)
I faxed lab order for Creatinine to [facility]. I called Rx's for Prednisone and Benadryl to [facility] in [place] on [address] [phone] and left message on machine.

## 2011-10-29 NOTE — Telephone Encounter (Signed)
Pt is scheduled for CT on 01/17 @ 1pm

## 2011-10-29 NOTE — Telephone Encounter (Signed)
Attempted to call pt, no answer. 

## 2011-10-30 NOTE — Telephone Encounter (Signed)
Tried to call pt. Home number, VM is full and cannot accept messages. Mobile number busy.

## 2011-10-31 ENCOUNTER — Telehealth: Payer: Self-pay | Admitting: Gastroenterology

## 2011-10-31 NOTE — Telephone Encounter (Signed)
Tried to call pt. Home number, VM full and could not leave message. Called mobile number and busy signal.

## 2011-10-31 NOTE — Telephone Encounter (Signed)
Results Cc to PCP  

## 2011-10-31 NOTE — Telephone Encounter (Signed)
Mailed letter for pt to call office for plan of care. [name] said she will cancel CT for tomorrow and reschedule when pt calls office.

## 2011-10-31 NOTE — Telephone Encounter (Signed)
Please call pt. She had HYPERPLASTIC POLYPS removed from her colon. TCS in 10 years. High fiber diet. SHE NEEDS TO GET HER CT SCAN DONE.

## 2011-11-01 ENCOUNTER — Ambulatory Visit (HOSPITAL_COMMUNITY): Admission: RE | Admit: 2011-11-01 | Payer: PRIVATE HEALTH INSURANCE | Source: Ambulatory Visit

## 2011-11-01 ENCOUNTER — Encounter (HOSPITAL_COMMUNITY): Payer: Self-pay | Admitting: Gastroenterology

## 2011-11-01 NOTE — Telephone Encounter (Signed)
Called pt and informed of her results. Also, she is aware she will need to get the CT. She said radiology was to call her back about that. I told her that I had called the meds in to the pharmacy and she will need to take them as directed prior to CT. She apologized because she has been having problems with her telephone.

## 2011-11-07 ENCOUNTER — Telehealth: Payer: Self-pay | Admitting: Gastroenterology

## 2011-11-07 NOTE — Telephone Encounter (Signed)
REVIEWED.  

## 2011-11-07 NOTE — Telephone Encounter (Signed)
Sherri Brooks called back to schedule her CT- she is scheduled for 01/28 @ 2:30- She is aware to go by radiology beforehand to pick up contrast and instructions

## 2011-11-12 ENCOUNTER — Ambulatory Visit (HOSPITAL_COMMUNITY): Payer: PRIVATE HEALTH INSURANCE

## 2011-11-12 ENCOUNTER — Ambulatory Visit: Admit: 2011-11-12 | Payer: Self-pay | Admitting: Gastroenterology

## 2011-11-12 SURGERY — COLONOSCOPY
Anesthesia: Moderate Sedation

## 2011-11-14 ENCOUNTER — Ambulatory Visit (HOSPITAL_COMMUNITY): Payer: PRIVATE HEALTH INSURANCE

## 2011-11-20 ENCOUNTER — Ambulatory Visit (HOSPITAL_COMMUNITY)
Admission: RE | Admit: 2011-11-20 | Discharge: 2011-11-20 | Disposition: A | Discharge status: Home / Self Care | Payer: PRIVATE HEALTH INSURANCE | Source: Ambulatory Visit | Attending: Gastroenterology | Admitting: Gastroenterology

## 2011-11-20 DIAGNOSIS — R634 Abnormal weight loss: Secondary | ICD-10-CM | POA: Insufficient documentation

## 2011-11-20 LAB — CREATININE, SERUM: GFR calc Af Amer: 72 mL/min — ABNORMAL LOW (ref >90)

## 2011-11-20 MED ORDER — IOHEXOL 300 MG/ML  SOLN
100.0000 mL | Freq: Once | INTRAMUSCULAR | Status: AC | PRN
  Administered 2011-11-20: 100 mL via INTRAVENOUS

## 2011-11-29 ENCOUNTER — Telehealth: Payer: Self-pay | Admitting: Gastroenterology

## 2011-11-29 ENCOUNTER — Other Ambulatory Visit: Payer: Self-pay | Admitting: Gastroenterology

## 2011-11-29 DIAGNOSIS — I774 Celiac artery compression syndrome: Secondary | ICD-10-CM

## 2011-11-29 NOTE — Telephone Encounter (Signed)
CALLED PT. EXPLAINED SHE MAY BENEFIT FROM AN IR EVAL DUE TO SIGNIFICANT ASCVD IN TEH SMA AND CELIAC ARTERIES.  PT HAS UNEXPLAINED WEIGHT LOSS( 60 LBS IN 2 YEARS), ANOREXIA, AND EARLY SATIETY. I PERSONALLY REVIEWED CT WITH DR. Tyron Russell.PT HAS > 50% STENOSIS IN THE CELIAC, 50% STENOSIS IN THE PROXIMA SMA, AND PLAQUE IN THE IMA. HE DID NOT FEEL CTA AT THIS POINT WOULD ADD ANYTHING CLINICALLY. RECOMMENDED INTERVENTIONAL RADIOLOGY EVAL FOR CHRONIC MESENTERIC ISCHEMIA.

## 2011-11-29 NOTE — Telephone Encounter (Signed)
Referral put in for Interventional Radiology- they will contact the pt to schedule

## 2011-12-18 ENCOUNTER — Other Ambulatory Visit: Payer: PRIVATE HEALTH INSURANCE

## 2012-01-23 ENCOUNTER — Other Ambulatory Visit: Payer: PRIVATE HEALTH INSURANCE

## 2012-02-14 ENCOUNTER — Encounter: Payer: Self-pay | Admitting: Gastroenterology

## 2012-02-27 ENCOUNTER — Ambulatory Visit: Payer: Medicare Other | Admitting: Vascular Surgery

## 2012-03-04 ENCOUNTER — Other Ambulatory Visit: Payer: PRIVATE HEALTH INSURANCE

## 2012-03-05 ENCOUNTER — Other Ambulatory Visit: Payer: Self-pay

## 2012-03-05 ENCOUNTER — Ambulatory Visit: Payer: Self-pay | Admitting: Vascular Surgery

## 2012-03-05 ENCOUNTER — Other Ambulatory Visit: Payer: PRIVATE HEALTH INSURANCE

## 2012-03-31 ENCOUNTER — Telehealth: Payer: Self-pay | Admitting: Radiology

## 2012-03-31 NOTE — Telephone Encounter (Signed)
Reminder call re:  Spoke w/ Markham Jordan (patient's daughter). Consultation appointment w/ Dr Fredia Sorrow 04-01-2012 @ 10:00.  Pt has been NO SHOW for two previous appointments.  Marylene Land stated that patient will be present for tomorrow's appointment.

## 2012-04-01 ENCOUNTER — Inpatient Hospital Stay: Admission: RE | Admit: 2012-04-01 | Payer: PRIVATE HEALTH INSURANCE | Source: Ambulatory Visit

## 2012-04-01 ENCOUNTER — Encounter: Payer: Self-pay | Admitting: Vascular Surgery

## 2012-04-02 ENCOUNTER — Ambulatory Visit: Payer: Self-pay | Admitting: Vascular Surgery

## 2012-04-02 ENCOUNTER — Other Ambulatory Visit: Payer: Self-pay

## 2012-05-20 ENCOUNTER — Encounter: Payer: Self-pay | Admitting: Neurosurgery

## 2012-05-21 ENCOUNTER — Other Ambulatory Visit: Payer: Self-pay

## 2012-05-21 ENCOUNTER — Ambulatory Visit: Payer: Self-pay | Admitting: Neurosurgery

## 2012-07-01 ENCOUNTER — Other Ambulatory Visit: Payer: Self-pay

## 2012-07-01 DIAGNOSIS — I70229 Atherosclerosis of native arteries of extremities with rest pain, unspecified extremity: Secondary | ICD-10-CM

## 2012-07-08 ENCOUNTER — Encounter: Payer: Self-pay | Admitting: Vascular Surgery

## 2012-07-09 ENCOUNTER — Other Ambulatory Visit: Payer: Self-pay

## 2012-07-09 ENCOUNTER — Ambulatory Visit (INDEPENDENT_AMBULATORY_CARE_PROVIDER_SITE_OTHER): Payer: PRIVATE HEALTH INSURANCE | Admitting: *Deleted

## 2012-07-09 ENCOUNTER — Encounter: Payer: Self-pay | Admitting: Vascular Surgery

## 2012-07-09 ENCOUNTER — Encounter (INDEPENDENT_AMBULATORY_CARE_PROVIDER_SITE_OTHER): Payer: PRIVATE HEALTH INSURANCE | Admitting: *Deleted

## 2012-07-09 ENCOUNTER — Ambulatory Visit (INDEPENDENT_AMBULATORY_CARE_PROVIDER_SITE_OTHER): Payer: PRIVATE HEALTH INSURANCE | Admitting: Vascular Surgery

## 2012-07-09 VITALS — BP 180/76 | HR 88 | Temp 98.3°F | Ht 62.0 in | Wt 125.8 lb

## 2012-07-09 DIAGNOSIS — L97509 Non-pressure chronic ulcer of other part of unspecified foot with unspecified severity: Secondary | ICD-10-CM

## 2012-07-09 DIAGNOSIS — I7025 Atherosclerosis of native arteries of other extremities with ulceration: Secondary | ICD-10-CM | POA: Insufficient documentation

## 2012-07-09 DIAGNOSIS — L98499 Non-pressure chronic ulcer of skin of other sites with unspecified severity: Secondary | ICD-10-CM

## 2012-07-09 DIAGNOSIS — Z91041 Radiographic dye allergy status: Secondary | ICD-10-CM

## 2012-07-09 DIAGNOSIS — I739 Peripheral vascular disease, unspecified: Secondary | ICD-10-CM

## 2012-07-09 DIAGNOSIS — I70229 Atherosclerosis of native arteries of extremities with rest pain, unspecified extremity: Secondary | ICD-10-CM

## 2012-07-09 MED ORDER — PREDNISONE 50 MG PO TABS: ORAL_TABLET | ORAL | Status: DC

## 2012-07-09 MED ORDER — DIPHENHYDRAMINE HCL 50 MG PO CAPS: ORAL_CAPSULE | ORAL | Status: DC

## 2012-07-09 MED ORDER — OXYCODONE-ACETAMINOPHEN 5-325 MG PO TABS: 1.0000 | ORAL_TABLET | ORAL | Status: DC | PRN

## 2012-07-09 NOTE — Progress Notes (Signed)
[facility]  Patient name: Sherri Brooks MRN: [id number] DOB: Dec 11, 1945 Sex: female  REASON FOR VISIT: nonhealing wound of the left foot  HPI: Sherri Brooks is a 66 y.o. female who underwent a left femoral to below knee popliteal artery bypass graft in October of 2011 I believe with a vein graft and also a transmetatarsal amputation. She has also had a previous right common iliac artery stent in January of 2011. She was seen in December of 2012 at which time she had biphasic Doppler signals in the peroneal and posterior tibial arteries on the left with a patent bypass graft. At that time she had an ABI of 92% on the right and 100% on the left. She does continue to smoke half a pack per day. Roughly 2 weeks ago she developed a wound on the lateral aspect of her left TMA. It was felt this might have been related to a foreign body in her shoe she cannot remember any specific injury. He has been going to the wound care center however there concerned about her circulation she was sent for vascular follow up. He does describe claudication in the left leg which is brought on by ambulation and relieved with rest. His most involves her calf. In addition she describes some rest pain in the left foot. She's had minimal drainage from the wound and denies any fever or chills.  Past Medical History  Diagnosis Date  . Diabetes mellitus   . Hypertension   . Hyperlipidemia   . PVD (peripheral vascular disease)   . Cellulitis of buttock, left 2010  . Arthritis   . GERD (gastroesophageal reflux disease)   . CAD (coronary artery disease)   . MI (myocardial infarction)     Family History  Problem Relation Age of Onset  . Brain cancer Father   . Cancer Father   . Diabetes Mother     many family members  . Alzheimer's disease Mother     SOCIAL HISTORY: History  Substance Use Topics  . Smoking status: Current Every Day Smoker -- 0.5 packs/day for 20 years    Types:  Cigarettes  . Smokeless tobacco: Never Used   Comment: pt states that she wants to quit and is going to talk to PCP about an aid  . Alcohol Use: Yes     special occasions    Allergies  Allergen Reactions  . Iohexol      Code: HIVES, Desc: PT STATES SHE EXPERIENCED ITCHING W/ IV DYE IN PAST-ARS 01/21/10, Onset Date: [id number]     Current Outpatient Prescriptions  Medication Sig Dispense Refill  . alendronate (FOSAMAX) 70 MG tablet Take 70 mg by mouth every 7 (seven) days. On Mondays      . aspirin 325 MG EC tablet Take 325 mg by mouth daily.        . clopidogrel (PLAVIX) 75 MG tablet Take 75 mg by mouth daily.        . hydrochlorothiazide (HYDRODIURIL) 25 MG tablet Take 25 mg by mouth daily.       Marland Kitchen labetalol (NORMODYNE) 200 MG tablet Take 200 mg by mouth 2 (two) times daily.        Marland Kitchen lisinopril-hydrochlorothiazide (PRINZIDE,ZESTORETIC) 20-12.5 MG per tablet Take 1 tablet by mouth daily.        Marland Kitchen LORazepam (ATIVAN) 0.5 MG tablet Take 0.5 mg by mouth every 8 (eight) hours as needed. anxiety      . metFORMIN (GLUCOPHAGE) 500 MG  tablet Take 500 mg by mouth 2 (two) times daily with a meal.        . Multiple Vitamin (MULITIVITAMIN WITH MINERALS) TABS Take 1 tablet by mouth daily.      . simvastatin (ZOCOR) 40 MG tablet Take 40 mg by mouth at bedtime.       Marland Kitchen ezetimibe (ZETIA) 10 MG tablet Take 10 mg by mouth daily.       Marland Kitchen oxyCODONE-acetaminophen (ROXICET) 5-325 MG per tablet Take 1-2 tablets by mouth every 4 (four) hours as needed for pain.  30 tablet  0    REVIEW OF SYSTEMS: Arly.Keller ] denotes positive finding; [  ] denotes negative finding  CARDIOVASCULAR:  [ ]  chest pain   [ ]  chest pressure   [ ]  palpitations   [ ]  orthopnea   [ ]  dyspnea on exertion   Arly.Keller ] claudication   Arly.Keller ] rest pain   [ ]  DVT   [ ]  phlebitis PULMONARY:   [ ]  productive cough   [ ]  asthma   [ ]  wheezing NEUROLOGIC:   [ ]  weakness  [ ]  paresthesias  [ ]  aphasia  [ ]  amaurosis  [ ]  dizziness HEMATOLOGIC:   [ ]  bleeding  problems   [ ]  clotting disorders MUSCULOSKELETAL:  [ ]  joint pain   [ ]  joint swelling [ ]  leg swelling GASTROINTESTINAL: [ ]   blood in stool  [ ]   hematemesis GENITOURINARY:  [ ]   dysuria  [ ]   hematuria PSYCHIATRIC:  [ ]  history of major depression INTEGUMENTARY:  [ ]  rashes  [ ]  ulcers CONSTITUTIONAL:  [ ]  fever   [ ]  chills  PHYSICAL EXAM: Filed Vitals:   07/09/12 1014  BP: 180/76  Pulse: 88  Temp: 98.3 F (36.8 C)  TempSrc: Oral  Height: 5\' 2"  (1.575 m)  Weight: 125 lb 12.8 oz (57.063 kg)  SpO2: 100%   Body mass index is 23.01 kg/(m^2). GENERAL: The patient is a well-nourished female, in no acute distress. The vital signs are documented above. CARDIOVASCULAR: There is a regular rate and rhythm. I do not detect carotid bruits. She has palpable femoral pulses. She has a palpable right popliteal pulse. I cannot palpate a left popliteal pulse. I cannot palpate pedal pulses on the left. The right foot appears adequately perfused. PULMONARY: There is good air exchange bilaterally without wheezing or rales. ABDOMEN: Soft and non-tender with normal pitched bowel sounds.  MUSCULOSKELETAL: she has a left transmetatarsal amputation. NEUROLOGIC: No focal weakness or paresthesias are detected. SKIN: there is a wound on the lateral aspect of her distal left transmetatarsal amputation it measures approximately 1-1/2-2 cm in diameter. This does not appear to be well perfused. PSYCHIATRIC: The patient has a normal affect.  DATA:  I have independently interpreted her lower extremity arterial duplex scan today which shows an ABI of 31% on the left and is dropped significantly since December. It appears that her left femoropopliteal bypass graft is occluded. She also has monophasic flow in the left common femoral artery suggesting possible inflow disease.  In February of this year she had a creatinine of 0.94 but apparently recently has had more recent studies to suggest that she's had some renal  insufficiency. She scheduled to be seen by her primary care physician concerning this.  MEDICAL ISSUES:  Atherosclerosis of native arteries of the extremities with ulceration This patient has a nonhealing ulcer of the left transmetatarsal amputation with evidence of severe) arterial occlusive disease on  the left. She has an occluded left femoropopliteal bypass graft which was patent in December. However this occluded silently. She does continue to smoke a we have discussed the importance of tobacco cessation. I have recommended we proceed with an arteriogram in order to determine if she is a candidate for redo bypass on the left. She does have a dye allergy and a half to be premedicated for this. In addition given her concerns for renal insufficiency she is to keep her appointment next week concerning this and we'll tentatively plan he arteriogram on 07/28/2012. If there are significant concerns about her renal function and we may have to use CO2 for the study. I have reviewed with the patient the indications for arteriography. In addition, I have reviewed the potential complications of arteriography including but not limited to: Bleeding, arterial injury, arterial thrombosis, dye action, renal insufficiency, or other unpredictable medical problems. I have explained to the patient that if we find disease amenable to angioplasty we could potentially address this at the same time. I have discussed the potential complications of angioplasty and stenting, including but not limited to: Bleeding, arterial thrombosis, arterial injury, dissection, or the need for surgical intervention. I have discussed with the patient the nature of atherosclerosis, and emphasized the importance of maximal medical management including control of blood pressure, blood glucose, and cholesterol levels, antiplatelet agents, obtaining regular exercise, and cessation of smoking. The patient is aware that without maximal medical management  the underlying atherosclerotic disease process will progress and also limit the benefit of any interventions. We will make further recommendations pending the results of her arteriogram.     Kada Friesen S [facility] Beeper: [phone]

## 2012-07-09 NOTE — Assessment & Plan Note (Signed)
This patient has a nonhealing ulcer of the left transmetatarsal amputation with evidence of severe) arterial occlusive disease on the left. She has an occluded left femoropopliteal bypass graft which was patent in December. However this occluded silently. She does continue to smoke a we have discussed the importance of tobacco cessation. I have recommended we proceed with an arteriogram in order to determine if she is a candidate for redo bypass on the left. She does have a dye allergy and a half to be premedicated for this. In addition given her concerns for renal insufficiency she is to keep her appointment next week concerning this and we'll tentatively plan he arteriogram on 07/28/2012. If there are significant concerns about her renal function and we may have to use CO2 for the study. I have reviewed with the patient the indications for arteriography. In addition, I have reviewed the potential complications of arteriography including but not limited to: Bleeding, arterial injury, arterial thrombosis, dye action, renal insufficiency, or other unpredictable medical problems. I have explained to the patient that if we find disease amenable to angioplasty we could potentially address this at the same time. I have discussed the potential complications of angioplasty and stenting, including but not limited to: Bleeding, arterial thrombosis, arterial injury, dissection, or the need for surgical intervention. I have discussed with the patient the nature of atherosclerosis, and emphasized the importance of maximal medical management including control of blood pressure, blood glucose, and cholesterol levels, antiplatelet agents, obtaining regular exercise, and cessation of smoking. The patient is aware that without maximal medical management the underlying atherosclerotic disease process will progress and also limit the benefit of any interventions. We will make further recommendations pending the results of her  arteriogram.

## 2012-07-18 ENCOUNTER — Encounter (HOSPITAL_COMMUNITY): Payer: Self-pay | Admitting: Pharmacy Technician

## 2012-07-22 ENCOUNTER — Ambulatory Visit: Payer: Self-pay | Admitting: Neurosurgery

## 2012-07-28 ENCOUNTER — Observation Stay (HOSPITAL_COMMUNITY)
Admission: RE | Admit: 2012-07-28 | Discharge: 2012-07-29 | Disposition: A | Discharge status: Home / Self Care | Payer: PRIVATE HEALTH INSURANCE | Source: Ambulatory Visit | Attending: Vascular Surgery | Admitting: Vascular Surgery

## 2012-07-28 ENCOUNTER — Telehealth: Payer: Self-pay | Admitting: Vascular Surgery

## 2012-07-28 ENCOUNTER — Other Ambulatory Visit: Payer: Self-pay | Admitting: *Deleted

## 2012-07-28 ENCOUNTER — Encounter (HOSPITAL_COMMUNITY)
Admission: RE | Disposition: A | Discharge status: Home / Self Care | Payer: Self-pay | Source: Ambulatory Visit | Attending: Vascular Surgery

## 2012-07-28 DIAGNOSIS — I70209 Unspecified atherosclerosis of native arteries of extremities, unspecified extremity: Principal | ICD-10-CM | POA: Insufficient documentation

## 2012-07-28 DIAGNOSIS — I2510 Atherosclerotic heart disease of native coronary artery without angina pectoris: Secondary | ICD-10-CM | POA: Insufficient documentation

## 2012-07-28 DIAGNOSIS — E119 Type 2 diabetes mellitus without complications: Secondary | ICD-10-CM | POA: Insufficient documentation

## 2012-07-28 DIAGNOSIS — Z48812 Encounter for surgical aftercare following surgery on the circulatory system: Secondary | ICD-10-CM

## 2012-07-28 DIAGNOSIS — I252 Old myocardial infarction: Secondary | ICD-10-CM | POA: Insufficient documentation

## 2012-07-28 DIAGNOSIS — I70219 Atherosclerosis of native arteries of extremities with intermittent claudication, unspecified extremity: Secondary | ICD-10-CM

## 2012-07-28 DIAGNOSIS — K219 Gastro-esophageal reflux disease without esophagitis: Secondary | ICD-10-CM | POA: Insufficient documentation

## 2012-07-28 DIAGNOSIS — I10 Essential (primary) hypertension: Secondary | ICD-10-CM | POA: Insufficient documentation

## 2012-07-28 DIAGNOSIS — I739 Peripheral vascular disease, unspecified: Secondary | ICD-10-CM

## 2012-07-28 DIAGNOSIS — Z01810 Encounter for preprocedural cardiovascular examination: Secondary | ICD-10-CM

## 2012-07-28 HISTORY — PX: LOWER EXTREMITY ANGIOGRAM: SHX5508

## 2012-07-28 HISTORY — PX: PERCUTANEOUS STENT INTERVENTION: SHX5500

## 2012-07-28 HISTORY — PX: ABDOMINAL AORTAGRAM: SHX5454

## 2012-07-28 LAB — POCT I-STAT, CHEM 8
BUN: 16 mg/dL (ref 6–23)
Chloride: 109 mEq/L (ref 96–112)
Creatinine, Ser: 0.9 mg/dL (ref 0.50–1.10)
Sodium: 141 mEq/L (ref 135–145)
TCO2: 20 mmol/L (ref 0–100)

## 2012-07-28 LAB — POCT ACTIVATED CLOTTING TIME
Activated Clotting Time: 229 seconds
Activated Clotting Time: 199 seconds

## 2012-07-28 LAB — GLUCOSE, CAPILLARY: Glucose-Capillary: 273 mg/dL — ABNORMAL HIGH (ref 70–99)

## 2012-07-28 SURGERY — ABDOMINAL AORTAGRAM
Anesthesia: LOCAL

## 2012-07-28 MED ORDER — HEPARIN (PORCINE) IN NACL 2-0.9 UNIT/ML-% IJ SOLN
INTRAMUSCULAR | Status: AC
  Filled 2012-07-28: qty 500

## 2012-07-28 MED ORDER — ONDANSETRON HCL 4 MG/2ML IJ SOLN: 4.0000 mg | Freq: Four times a day (QID) | INTRAMUSCULAR | Status: DC | PRN

## 2012-07-28 MED ORDER — OXYCODONE-ACETAMINOPHEN 5-325 MG PO TABS: 1.0000 | ORAL_TABLET | ORAL | Status: DC | PRN

## 2012-07-28 MED ORDER — AMLODIPINE BESYLATE 5 MG PO TABS
5.0000 mg | ORAL_TABLET | Freq: Every day | ORAL | Status: DC
  Administered 2012-07-29: 5 mg via ORAL
  Filled 2012-07-28: qty 1

## 2012-07-28 MED ORDER — LIDOCAINE HCL (PF) 1 % IJ SOLN
INTRAMUSCULAR | Status: AC
  Filled 2012-07-28: qty 30

## 2012-07-28 MED ORDER — SENNA 8.6 MG PO TABS
1.0000 | ORAL_TABLET | Freq: Two times a day (BID) | ORAL | Status: DC
  Administered 2012-07-28 – 2012-07-29 (×2): 8.6 mg via ORAL
  Filled 2012-07-28 (×3): qty 1

## 2012-07-28 MED ORDER — LABETALOL HCL 200 MG PO TABS
200.0000 mg | ORAL_TABLET | Freq: Two times a day (BID) | ORAL | Status: DC
  Administered 2012-07-28 – 2012-07-29 (×2): 200 mg via ORAL
  Filled 2012-07-28 (×3): qty 1

## 2012-07-28 MED ORDER — TEMAZEPAM 15 MG PO CAPS: 15.0000 mg | ORAL_CAPSULE | Freq: Every evening | ORAL | Status: DC | PRN

## 2012-07-28 MED ORDER — METHYLPREDNISOLONE SODIUM SUCC 125 MG IJ SOLR: 125.0000 mg | INTRAMUSCULAR | Status: DC

## 2012-07-28 MED ORDER — DIPHENHYDRAMINE HCL 50 MG/ML IJ SOLN: 25.0000 mg | INTRAMUSCULAR | Status: DC

## 2012-07-28 MED ORDER — METOPROLOL TARTRATE 1 MG/ML IV SOLN: 2.0000 mg | INTRAVENOUS | Status: DC | PRN

## 2012-07-28 MED ORDER — FAMOTIDINE IN NACL 20-0.9 MG/50ML-% IV SOLN
20.0000 mg | INTRAVENOUS | Status: AC
  Administered 2012-07-28: 20 mg via INTRAVENOUS
  Filled 2012-07-28: qty 50

## 2012-07-28 MED ORDER — LORAZEPAM 0.5 MG PO TABS: 0.5000 mg | ORAL_TABLET | Freq: Three times a day (TID) | ORAL | Status: DC | PRN

## 2012-07-28 MED ORDER — INSULIN GLARGINE 100 UNIT/ML ~~LOC~~ SOLN
10.0000 [IU] | Freq: Every day | SUBCUTANEOUS | Status: DC
  Administered 2012-07-28: 10 [IU] via SUBCUTANEOUS

## 2012-07-28 MED ORDER — SODIUM CHLORIDE 0.9 % IV SOLN
INTRAVENOUS | Status: DC
  Administered 2012-07-28: 08:00:00 via INTRAVENOUS

## 2012-07-28 MED ORDER — INSULIN REGULAR HUMAN 100 UNIT/ML IJ SOLN
6.0000 [IU] | Freq: Once | INTRAMUSCULAR | Status: DC
  Filled 2012-07-28: qty 0.06

## 2012-07-28 MED ORDER — MIDAZOLAM HCL 2 MG/2ML IJ SOLN
INTRAMUSCULAR | Status: AC
  Filled 2012-07-28: qty 2

## 2012-07-28 MED ORDER — OXYCODONE HCL 5 MG PO TABS: 5.0000 mg | ORAL_TABLET | ORAL | Status: DC | PRN

## 2012-07-28 MED ORDER — INSULIN ASPART 100 UNIT/ML ~~LOC~~ SOLN
0.0000 [IU] | Freq: Three times a day (TID) | SUBCUTANEOUS | Status: DC
  Administered 2012-07-29: 2 [IU] via SUBCUTANEOUS

## 2012-07-28 MED ORDER — ALENDRONATE SODIUM 70 MG PO TABS
70.0000 mg | ORAL_TABLET | ORAL | Status: DC
Start: 2012-07-28 — End: 2012-07-28

## 2012-07-28 MED ORDER — PANTOPRAZOLE SODIUM 40 MG PO TBEC: 40.0000 mg | DELAYED_RELEASE_TABLET | Freq: Every day | ORAL | Status: DC

## 2012-07-28 MED ORDER — HEPARIN SODIUM (PORCINE) 1000 UNIT/ML IJ SOLN
INTRAMUSCULAR | Status: AC
  Filled 2012-07-28: qty 1

## 2012-07-28 MED ORDER — PHENOL 1.4 % MT LIQD
1.0000 | OROMUCOSAL | Status: DC | PRN
  Filled 2012-07-28: qty 177

## 2012-07-28 MED ORDER — ACETAMINOPHEN 325 MG PO TABS: 650.0000 mg | ORAL_TABLET | ORAL | Status: DC | PRN

## 2012-07-28 MED ORDER — MORPHINE SULFATE 2 MG/ML IJ SOLN: 2.0000 mg | INTRAMUSCULAR | Status: DC | PRN

## 2012-07-28 MED ORDER — ACETAMINOPHEN 650 MG RE SUPP: 325.0000 mg | RECTAL | Status: DC | PRN

## 2012-07-28 MED ORDER — HYDRALAZINE HCL 20 MG/ML IJ SOLN
10.0000 mg | INTRAMUSCULAR | Status: DC | PRN
  Filled 2012-07-28: qty 0.5

## 2012-07-28 MED ORDER — ALUM & MAG HYDROXIDE-SIMETH 200-200-20 MG/5ML PO SUSP: 15.0000 mL | ORAL | Status: DC | PRN

## 2012-07-28 MED ORDER — ADULT MULTIVITAMIN W/MINERALS CH
1.0000 | ORAL_TABLET | Freq: Every day | ORAL | Status: DC
  Administered 2012-07-29: 1 via ORAL
  Filled 2012-07-28: qty 1

## 2012-07-28 MED ORDER — ACETAMINOPHEN 325 MG PO TABS: 325.0000 mg | ORAL_TABLET | ORAL | Status: DC | PRN

## 2012-07-28 MED ORDER — GUAIFENESIN-DM 100-10 MG/5ML PO SYRP: 15.0000 mL | ORAL_SOLUTION | ORAL | Status: DC | PRN

## 2012-07-28 MED ORDER — SIMVASTATIN 40 MG PO TABS
40.0000 mg | ORAL_TABLET | Freq: Every day | ORAL | Status: DC
  Administered 2012-07-28: 40 mg via ORAL
  Filled 2012-07-28 (×2): qty 1

## 2012-07-28 MED ORDER — FENTANYL CITRATE 0.05 MG/ML IJ SOLN
INTRAMUSCULAR | Status: AC
  Filled 2012-07-28: qty 2

## 2012-07-28 MED ORDER — INSULIN ASPART 100 UNIT/ML ~~LOC~~ SOLN
SUBCUTANEOUS | Status: AC
  Administered 2012-07-28: 6 [IU] via SUBCUTANEOUS
  Filled 2012-07-28: qty 1

## 2012-07-28 MED ORDER — ASPIRIN EC 325 MG PO TBEC
325.0000 mg | DELAYED_RELEASE_TABLET | Freq: Every day | ORAL | Status: DC
  Administered 2012-07-28 – 2012-07-29 (×2): 325 mg via ORAL
  Filled 2012-07-28 (×3): qty 1

## 2012-07-28 MED ORDER — SODIUM CHLORIDE 0.9 % IV SOLN: 1.0000 mL/kg/h | INTRAVENOUS | Status: DC

## 2012-07-28 MED ORDER — CLOPIDOGREL BISULFATE 75 MG PO TABS
75.0000 mg | ORAL_TABLET | Freq: Every day | ORAL | Status: DC
  Administered 2012-07-29: 75 mg via ORAL
  Filled 2012-07-28: qty 1

## 2012-07-28 MED ORDER — LABETALOL HCL 5 MG/ML IV SOLN
10.0000 mg | INTRAVENOUS | Status: DC | PRN
  Filled 2012-07-28: qty 4

## 2012-07-28 MED ORDER — LABETALOL HCL 5 MG/ML IV SOLN
INTRAVENOUS | Status: AC
  Filled 2012-07-28: qty 4

## 2012-07-28 NOTE — H&P (View-Only) (Signed)
[facility] [place]  Patient name: Sherri Brooks MRN: [id number] DOB: 08/04/1946 Sex: female  REASON FOR VISIT: nonhealing wound of the left foot  HPI: Sherri Brooks is a 66 y.o. female who underwent a left femoral to below knee popliteal artery bypass graft in October of 2011 I believe with a vein graft and also a transmetatarsal amputation. She has also had a previous right common iliac artery stent in January of 2011. She was seen in December of 2012 at which time she had biphasic Doppler signals in the peroneal and posterior tibial arteries on the left with a patent bypass graft. At that time she had an ABI of 92% on the right and 100% on the left. She does continue to smoke half a pack per day. Roughly 2 weeks ago she developed a wound on the lateral aspect of her left TMA. It was felt this might have been related to a foreign body in her shoe she cannot remember any specific injury. He has been going to the wound care center however there concerned about her circulation she was sent for vascular follow up. He does describe claudication in the left leg which is brought on by ambulation and relieved with rest. His most involves her calf. In addition she describes some rest pain in the left foot. She's had minimal drainage from the wound and denies any fever or chills.  Past Medical History  Diagnosis Date  . Diabetes mellitus   . Hypertension   . Hyperlipidemia   . PVD (peripheral vascular disease)   . Cellulitis of buttock, left 2010  . Arthritis   . GERD (gastroesophageal reflux disease)   . CAD (coronary artery disease)   . MI (myocardial infarction)     Family History  Problem Relation Age of Onset  . Brain cancer Father   . Cancer Father   . Diabetes Mother     many family members  . Alzheimer's disease Mother     SOCIAL HISTORY: History  Substance Use Topics  . Smoking status: Current Every Day Smoker -- 0.5 packs/day for 20 years    Types:  Cigarettes  . Smokeless tobacco: Never Used   Comment: pt states that she wants to quit and is going to talk to PCP about an aid  . Alcohol Use: Yes     special occasions    Allergies  Allergen Reactions  . Iohexol      Code: HIVES, Desc: PT STATES SHE EXPERIENCED ITCHING W/ IV DYE IN PAST-ARS 01/21/10, Onset Date: [id number]     Current Outpatient Prescriptions  Medication Sig Dispense Refill  . alendronate (FOSAMAX) 70 MG tablet Take 70 mg by mouth every 7 (seven) days. On Mondays      . aspirin 325 MG EC tablet Take 325 mg by mouth daily.        . clopidogrel (PLAVIX) 75 MG tablet Take 75 mg by mouth daily.        . hydrochlorothiazide (HYDRODIURIL) 25 MG tablet Take 25 mg by mouth daily.       . labetalol (NORMODYNE) 200 MG tablet Take 200 mg by mouth 2 (two) times daily.        . lisinopril-hydrochlorothiazide (PRINZIDE,ZESTORETIC) 20-12.5 MG per tablet Take 1 tablet by mouth daily.        . LORazepam (ATIVAN) 0.5 MG tablet Take 0.5 mg by mouth every 8 (eight) hours as needed. anxiety      . metFORMIN (GLUCOPHAGE) 500 MG   tablet Take 500 mg by mouth 2 (two) times daily with a meal.        . Multiple Vitamin (MULITIVITAMIN WITH MINERALS) TABS Take 1 tablet by mouth daily.      . simvastatin (ZOCOR) 40 MG tablet Take 40 mg by mouth at bedtime.       . ezetimibe (ZETIA) 10 MG tablet Take 10 mg by mouth daily.       . oxyCODONE-acetaminophen (ROXICET) 5-325 MG per tablet Take 1-2 tablets by mouth every 4 (four) hours as needed for pain.  30 tablet  0    REVIEW OF SYSTEMS: [X ] denotes positive finding; [  ] denotes negative finding  CARDIOVASCULAR:  [ ] chest pain   [ ] chest pressure   [ ] palpitations   [ ] orthopnea   [ ] dyspnea on exertion   [X ] claudication   [X ] rest pain   [ ] DVT   [ ] phlebitis PULMONARY:   [ ] productive cough   [ ] asthma   [ ] wheezing NEUROLOGIC:   [ ] weakness  [ ] paresthesias  [ ] aphasia  [ ] amaurosis  [ ] dizziness HEMATOLOGIC:   [ ] bleeding  problems   [ ] clotting disorders MUSCULOSKELETAL:  [ ] joint pain   [ ] joint swelling [ ] leg swelling GASTROINTESTINAL: [ ]  blood in stool  [ ]  hematemesis GENITOURINARY:  [ ]  dysuria  [ ]  hematuria PSYCHIATRIC:  [ ] history of major depression INTEGUMENTARY:  [ ] rashes  [ ] ulcers CONSTITUTIONAL:  [ ] fever   [ ] chills  PHYSICAL EXAM: Filed Vitals:   07/09/12 1014  BP: 180/76  Pulse: 88  Temp: 98.3 F (36.8 C)  TempSrc: Oral  Height: 5' 2" (1.575 m)  Weight: 125 lb 12.8 oz (57.063 kg)  SpO2: 100%   Body mass index is 23.01 kg/(m^2). GENERAL: The patient is a well-nourished female, in no acute distress. The vital signs are documented above. CARDIOVASCULAR: There is a regular rate and rhythm. I do not detect carotid bruits. She has palpable femoral pulses. She has a palpable right popliteal pulse. I cannot palpate a left popliteal pulse. I cannot palpate pedal pulses on the left. The right foot appears adequately perfused. PULMONARY: There is good air exchange bilaterally without wheezing or rales. ABDOMEN: Soft and non-tender with normal pitched bowel sounds.  MUSCULOSKELETAL: she has a left transmetatarsal amputation. NEUROLOGIC: No focal weakness or paresthesias are detected. SKIN: there is a wound on the lateral aspect of her distal left transmetatarsal amputation it measures approximately 1-1/2-2 cm in diameter. This does not appear to be well perfused. PSYCHIATRIC: The patient has a normal affect.  DATA:  I have independently interpreted her lower extremity arterial duplex scan today which shows an ABI of 31% on the left and is dropped significantly since December. It appears that her left femoropopliteal bypass graft is occluded. She also has monophasic flow in the left common femoral artery suggesting possible inflow disease.  In February of this year she had a creatinine of 0.94 but apparently recently has had more recent studies to suggest that she's had some renal  insufficiency. She scheduled to be seen by her primary care physician concerning this.  MEDICAL ISSUES:  Atherosclerosis of native arteries of the extremities with ulceration This patient has a nonhealing ulcer of the left transmetatarsal amputation with evidence of severe) arterial occlusive disease on   the left. She has an occluded left femoropopliteal bypass graft which was patent in December. However this occluded silently. She does continue to smoke a we have discussed the importance of tobacco cessation. I have recommended we proceed with an arteriogram in order to determine if she is a candidate for redo bypass on the left. She does have a dye allergy and a half to be premedicated for this. In addition given her concerns for renal insufficiency she is to keep her appointment next week concerning this and we'll tentatively plan he arteriogram on 07/28/2012. If there are significant concerns about her renal function and we may have to use CO2 for the study. I have reviewed with the patient the indications for arteriography. In addition, I have reviewed the potential complications of arteriography including but not limited to: Bleeding, arterial injury, arterial thrombosis, dye action, renal insufficiency, or other unpredictable medical problems. I have explained to the patient that if we find disease amenable to angioplasty we could potentially address this at the same time. I have discussed the potential complications of angioplasty and stenting, including but not limited to: Bleeding, arterial thrombosis, arterial injury, dissection, or the need for surgical intervention. I have discussed with the patient the nature of atherosclerosis, and emphasized the importance of maximal medical management including control of blood pressure, blood glucose, and cholesterol levels, antiplatelet agents, obtaining regular exercise, and cessation of smoking. The patient is aware that without maximal medical management  the underlying atherosclerotic disease process will progress and also limit the benefit of any interventions. We will make further recommendations pending the results of her arteriogram.     Karston Hyland S [facility] [place] Beeper: [phone]    

## 2012-07-28 NOTE — Interval H&P Note (Signed)
History and Physical Interval Note:  07/28/2012 8:15 AM  Sherri Brooks  has presented today for surgery, with the diagnosis of PVD  The various methods of treatment have been discussed with the patient and family. After consideration of risks, benefits and other options for treatment, the patient has consented to  Procedure(s) (LRB) with comments: ABDOMINAL AORTAGRAM (N/A) POSSIBLE ILIAC ARTERY ANGIOPLASTY AND STENT ON LEFT. The patient's history has been reviewed, patient examined, no change in status, stable for surgery.  I have reviewed the patient's chart and labs.  Questions were answered to the patient's satisfaction.     Jarret Torre S

## 2012-07-28 NOTE — Telephone Encounter (Signed)
Message copied by Fredrich Birks on Mon Jul 28, 2012 11:40 AM ------      Message from: Sharee Pimple      Created: Mon Jul 28, 2012 11:02 AM      Regarding: schedule                   ----- Message -----         From: Melene Plan, RN         Sent: 07/28/2012  10:31 AM           To: Sharee Pimple, CMA, [facility]      Subject: FW: charge and office visit                                          ----- Message -----         From: Chuck Hint, MD         Sent: 07/28/2012  10:02 AM           To: Reuel Derby, Melene Plan, RN      Subject: charge and office visit                                  PROCEDURE:       1. Ultrasound-guided access to the left common femoral artery      2. Aortogram with bilateral iliac arteriogram      3. PTA and stenting of the left external iliac artery using a Cordis 7 mm x 6 cm self-expanding stent and post dilatation with a 6 mm x 6 cm balloon.      4. Bilateral lower extremity runoff            SURGEON: Di Kindle. Edilia Bo, MD, FACS            She will need an office visit in approximately 3-4 weeks with ABIs. She also needs a vein map on the right side in case she were to need a bypass on the left using vein from the right side area and I've are taken her vein from the left side. Thank you. CSD

## 2012-07-28 NOTE — Op Note (Signed)
PATIENT: Sherri Brooks   MRN: [id number] DOB: 02-06-1946    DATE OF PROCEDURE: 07/28/2012  INDICATIONS: Sherri Brooks is a 66 y.o. female who presents with a wound on the lateral aspect of her left transmetatarsal amputation. She has an occluded femoropopliteal bypass graft which had been done 2 years ago with pain. She had a markedly diminished femoral pulse. She presents for diagnostic arteriography and possible PTA and stenting.  PROCEDURE:  1. Ultrasound-guided access to the left common femoral artery 2. Aortogram with bilateral iliac arteriogram 3. PTA and stenting of the left external iliac artery using a Cordis 7 mm x 6 cm self-expanding stent and post dilatation with a 6 mm x 6 cm balloon. 4. Bilateral lower extremity runoff  SURGEON: Di Kindle. Edilia Bo, MD, FACS  ANESTHESIA: local with sedation   EBL: minimal  TECHNIQUE: The patient was brought to the peripheral vascular lab and sedated with 1 mg of Versed and 50 mcg of fentanyl. Both groins were prepped and draped in the usual sterile fashion. After the skin was anesthetized with 1% lidocaine, and under ultrasound guidance, the left common femoral artery was cannulated and a guidewire introduced into the infrarenal aorta under fluoroscopic control. A 5 French sheath was placed over the wire. A pigtail catheter was positioned at the L1 vertebral body and a flush aortogram obtained. Oblique  iliac projections were obtained. There was noted to be a 70-80% left external iliac artery stenosis. I elected to address this. A 5 French sheath was exchanged for a bright tip 6 French sheath over the wire. The patient received 3000 units of IV heparin. ACT was 229. A Cordis 7 mm x 6 cm self-expanding stent was selected after a retrograde arteriogram was obtained and measurements were made. The stent was positioned across the stenosis and deployed without difficulty. Post dilatation was done with a 6 mm x 6 cm balloon which was inflated to 10  atmospheres. Lesion film showed no residual stenosis. Next a pigtail catheter was placed in the distal aorta and bilateral lower extremity runoff obtained.  FINDINGS:  1. No significant renal artery stenosis is identified. The infrarenal aorta is widely patent. 2. On the right side, the old common iliac artery stent is patent. The external iliac and hypogastric arteries are patent on the right. The common femoral, superficial femoral and deep femoral arteries are patent. There is mild to moderate diffuse disease throughout the superficial femoral artery and popliteal artery on the right but no focal stenosis or occlusion. The dominant runoff on the right is the peroneal artery. The anterior tibial artery is occluded. The posterior tibial artery has moderate diffuse disease. 3. On the left side, which is the symptomatic side, there is calcific disease in the common iliac artery but no focal stenosis. There is severe disease of the hypogastric artery on the left. The 70-80% external iliac artery stenosis on the left was successfully ballooned and stented as described above. The common femoral and deep femoral artery on the left are patent. There is severe diffuse disease throughout the superficial femoral artery on the left. The femoropopliteal bypass graft is not visualized. There is reconstitution of the popliteal artery at the level of the knee. The anterior tibial artery is occluded. The dominant runoff is the peroneal artery. There is a diffusely diseased posterior tibial artery which is small which is also visualized.  Waverly Ferrari, MD, FACS [facility]  DATE OF DICTATION:   07/28/2012

## 2012-07-28 NOTE — Telephone Encounter (Signed)
Spoke with pt to schedule and sent letter, dpm

## 2012-07-28 NOTE — Progress Notes (Signed)
PHARMACIST - PHYSICIAN COMMUNICATION  CONCERNING: P&T Medication Policy Regarding Oral Bisphosphonates  RECOMMENDATION: Your order for alendronate (Fosamax), ibandronate (Boniva), or risedronate (Actonel) has been discontinued at this time.  If the patient's post-hospital medical condition warrants safe use of this class of drugs, please resume the pre-hospital regimen upon discharge.  DESCRIPTION:  Alendronate (Fosamax), ibandronate (Boniva), and risedronate (Actonel) can cause severe esophageal erosions in patients who are unable to remain upright at least 30 minutes after taking this medication.   Since brief interruptions in therapy are thought to have minimal impact on bone mineral density, the Pharmacy & Therapeutics Committee has established that bisphosphonate orders should be routinely discontinued during hospitalization.   To override this safety policy and permit administration of Boniva, Fosamax, or Actonel in the hospital, prescribers must write "DO NOT HOLD" in the comments section when placing the order for this class of medications.  Thank you,  Brett Fairy, PharmD 07/28/2012 6:32 PM

## 2012-07-28 NOTE — Progress Notes (Signed)
Called by RN to see pt about left groin oozing after iliac stent placement.  RN held pressure, but there was still bright red blood oozing.  I came to see the pt and also held pressure and she continued to have a oozing at the site.  Left groin is soft and pt does not complain of any pain.  She currently is on Plavix and Aspirin.  Will admit pt overnight for observation with pressure dressing and bed rest with bathroom privileges.   Doreatha Massed 07/28/2012. 4:34 PM

## 2012-07-28 NOTE — Progress Notes (Signed)
REPORT CALLED AND TRANSFERRED VIA W/C TO 2031

## 2012-07-28 NOTE — Progress Notes (Signed)
S ELLINGTON,PA NOTIFIED OF CBG AND NO NEW ORDERS NOTED

## 2012-07-28 NOTE — Progress Notes (Signed)
OOZING NOTED LEFT GROIN AND DRESSING CHANGED AND UP AND WALKED AND MORE OOZING NOTED AND S ELLINGTON,PA NOTIFIED AND PER PA HELD PRESSURE LEFT GROIN X AND CONTINUED OOZING NOTED AND S ELLINGTON,PA NOTIFIED AND SHE WILL BE IN TO SEE CLIENT

## 2012-07-29 LAB — CBC
HCT: 29.3 % — ABNORMAL LOW (ref 36.0–46.0)
Hemoglobin: 10.1 g/dL — ABNORMAL LOW (ref 12.0–15.0)
MCH: 30.1 pg (ref 26.0–34.0)
MCHC: 34.5 g/dL (ref 30.0–36.0)
RDW: 13.8 % (ref 11.5–15.5)

## 2012-07-29 NOTE — Progress Notes (Signed)
VASCULAR PROGRESS NOTE  SUBJECTIVE: Comfortable. No complaints.  PHYSICAL EXAM: Filed Vitals:   07/28/12 1525 07/28/12 1527 07/28/12 1839 07/29/12 0423  BP: 126/71 114/57 156/64 144/61  Pulse: 79 81 77 80  Temp:   97.8 F (36.6 C) 98 F (36.7 C)  TempSrc:   Oral Oral  Resp:   18 18  Height:   5\' 3"  (1.6 m)   Weight:   125 lb (56.7 kg) 122 lb 9.6 oz (55.611 kg)  SpO2: 100% 98% 95% 100%   Left leg warm. Small amount of oozing from left groin incision. No hematoma  LABS: Lab Results  Component Value Date   WBC 12.0* 07/29/2012   HGB 10.1* 07/29/2012   HCT 29.3* 07/29/2012   MCV 87.2 07/29/2012   PLT 236 07/29/2012   Lab Results  Component Value Date   CREATININE 0.90 07/28/2012   Lab Results  Component Value Date   INR 1.11 07/27/2010   CBG (last 3)   Basename 07/29/12 0627 07/28/12 1442 07/28/12 0957  GLUCAP 166* 273* 270*     ASSESSMENT/PLAN: 1.Oozing is from a skin edge or subcutaneous tissue. It simply needs a stitch. Will place stitch at bedside and then discharge this AM. F/U was already arranged.    Waverly Ferrari, MD, FACS Beeper: [phone] 07/29/2012

## 2012-07-29 NOTE — Care Management Note (Signed)
    Page 1 of 1   07/29/2012     2:10:11 PM   CARE MANAGEMENT NOTE 07/29/2012  Patient:  Sherri Brooks, Sherri Brooks   Account Number:  [id number]  Date Initiated:  07/29/2012  Documentation initiated by:  Jlynn Langille  Subjective/Objective Assessment:   PT S/P LT ILIAC STENT ON 07/28/12.  PTA, PT INDEPENDENT, LIVES WITH FAMILY.     Action/Plan:   PT FOR DC HOME TODAY; NO DISCHARGE NEEDS.   Anticipated DC Date:  07/29/2012   Anticipated DC Plan:  HOME/SELF CARE      DC Planning Services  CM consult      Choice offered to / List presented to:             Status of service:  Completed, signed off Medicare Important Message given?   (If response is "NO", the following Medicare IM given date fields will be blank) Date Medicare IM given:   Date Additional Medicare IM given:    Discharge Disposition:  HOME/SELF CARE  Per UR Regulation:  Reviewed for med. necessity/level of care/duration of stay  If discussed at Long Length of Stay Meetings, dates discussed:    Comments:

## 2012-07-29 NOTE — Discharge Summary (Signed)
Patient ID: Sherri Brooks MRN: [id number] DOB/AGE: 67-Jan-1947 67 y.o.  Admit date: 07/28/2012 Discharge date: 07/29/2012  Admission Diagnosis: PVD  Discharge Diagnoses:  PVD  Secondary Diagnoses: Past Medical History  Diagnosis Date  . Diabetes mellitus   . Hypertension   . Hyperlipidemia   . PVD (peripheral vascular disease)   . Cellulitis of buttock, left 2010  . Arthritis   . GERD (gastroesophageal reflux disease)   . CAD (coronary artery disease)   . MI (myocardial infarction)     Procedures: 07/28/2012 Surgeon(s): Chuck Hint, MD Procedure(s): ABDOMINAL Ronny Flurry LOWER EXTREMITY ANGIOGRAM PERCUTANEOUS STENT INTERVENTION Left  Discharged Condition: good  HPI: This is a 66 year old woman presented with a wound on her left transmetatarsal amputation. She had an occluded femoropopliteal bypass graft and diminished left femoral pulse. She is brought in for diagnostic arteriography and possible angioplasty.  Hospital Course: the patient was admitted overnight if she had small amount of oozing from her left groin. There was no hematoma and no evidence of arterial bleeding this appeared to be a skin edge bleeder. On the following morning there was still some oozing from this and therefore the bedside I placed a 3-0 nylon suture which stopped the bleeding. She was then ready for discharge.  Consults:     Significant Diagnostic Studies: CBC    Component Value Date/Time   WBC 12.0* 07/29/2012 0550   RBC 3.36* 07/29/2012 0550   HGB 10.1* 07/29/2012 0550   HCT 29.3* 07/29/2012 0550   PLT 236 07/29/2012 0550   MCV 87.2 07/29/2012 0550   MCH 30.1 07/29/2012 0550   MCHC 34.5 07/29/2012 0550   RDW 13.8 07/29/2012 0550   LYMPHSABS 2.1 07/09/2010 0642   MONOABS 0.8 07/09/2010 0642   EOSABS 0.2 07/09/2010 0642   BASOSABS 0.0 07/09/2010 0642    BMET    Component Value Date/Time   NA 141 07/28/2012 0744   NA 140 08/22/2011 0924   K 4.2 07/28/2012 0744   K  3.8 08/22/2011 0924   CL 109 07/28/2012 0744   CL 107 08/22/2011 0924   CO2 25 08/22/2011 0924   CO2 26 08/02/2010 0345   GLUCOSE 260* 07/28/2012 0744   BUN 16 07/28/2012 0744   CREATININE 0.90 07/28/2012 0744   CALCIUM 8.6 08/02/2010 0345   GFRNONAA 62* 11/20/2011 1308   GFRAA 72* 11/20/2011 1308    COAG Lab Results  Component Value Date   INR 1.11 07/27/2010   INR 1.26 01/16/2010   No results found for this basename: PTT    Disposition: 01-Home or Self Care  Discharge Orders    Future Appointments: Provider: Department: Dept Phone: Center:   08/27/2012 9:30 AM [facility] [phone] [facility]   08/27/2012 10:00 AM [facility] [phone] [facility]   08/27/2012 10:45 AM Chuck Hint, MD [facility] [phone] [facility]       Medication List     As of 07/29/2012  1:18 PM    TAKE these medications         alendronate 70 MG tablet   Commonly known as: FOSAMAX   Take 70 mg by mouth every 7 (seven) days. On Mondays      amLODipine 5 MG tablet   Commonly known as: NORVASC   Take 5 mg by mouth daily.      aspirin 325 MG EC tablet   Take 325 mg by mouth daily.      clopidogrel 75 MG tablet   Commonly known as:  PLAVIX   Take 75 mg by mouth daily.      insulin glargine 100 UNIT/ML injection   Commonly known as: LANTUS   Inject 10 Units into the skin at bedtime.      labetalol 200 MG tablet   Commonly known as: NORMODYNE   Take 200 mg by mouth 2 (two) times daily.      LORazepam 0.5 MG tablet   Commonly known as: ATIVAN   Take 0.5 mg by mouth every 8 (eight) hours as needed. anxiety      multivitamin with minerals Tabs   Take 1 tablet by mouth daily.      oxyCODONE-acetaminophen 5-325 MG per tablet   Commonly known as: PERCOCET/ROXICET   Take 1-2 tablets by mouth every 4 (four) hours as needed for pain.      simvastatin 40 MG tablet   Commonly known as: ZOCOR   Take 40 mg by mouth at bedtime.          Signed: [name] 07/29/2012, 1:18 PM

## 2012-07-29 NOTE — Progress Notes (Signed)
Retro ur ins review. 

## 2012-07-29 NOTE — Progress Notes (Signed)
Pt and family have been educated on D/C information. Pt and family have no further questions or concerns. Discussed groin care and tobacco cessation in depth. Pt verbalized understanding.

## 2012-08-06 ENCOUNTER — Encounter: Payer: Self-pay | Admitting: Vascular Surgery

## 2012-08-26 ENCOUNTER — Encounter: Payer: Self-pay | Admitting: Vascular Surgery

## 2012-08-27 ENCOUNTER — Ambulatory Visit: Payer: PRIVATE HEALTH INSURANCE | Admitting: Vascular Surgery

## 2012-09-30 ENCOUNTER — Encounter: Payer: Self-pay | Admitting: Vascular Surgery

## 2012-10-01 ENCOUNTER — Ambulatory Visit (INDEPENDENT_AMBULATORY_CARE_PROVIDER_SITE_OTHER): Payer: PRIVATE HEALTH INSURANCE | Admitting: Vascular Surgery

## 2012-10-01 ENCOUNTER — Encounter: Payer: Self-pay | Admitting: Vascular Surgery

## 2012-10-01 ENCOUNTER — Ambulatory Visit: Payer: PRIVATE HEALTH INSURANCE | Admitting: Vascular Surgery

## 2012-10-01 ENCOUNTER — Encounter (INDEPENDENT_AMBULATORY_CARE_PROVIDER_SITE_OTHER): Payer: PRIVATE HEALTH INSURANCE | Admitting: *Deleted

## 2012-10-01 VITALS — BP 154/61 | HR 89 | Ht 63.0 in | Wt 124.0 lb

## 2012-10-01 DIAGNOSIS — I739 Peripheral vascular disease, unspecified: Secondary | ICD-10-CM

## 2012-10-01 DIAGNOSIS — Z48812 Encounter for surgical aftercare following surgery on the circulatory system: Secondary | ICD-10-CM

## 2012-10-01 DIAGNOSIS — Z01810 Encounter for preprocedural cardiovascular examination: Secondary | ICD-10-CM

## 2012-10-01 DIAGNOSIS — L98499 Non-pressure chronic ulcer of skin of other sites with unspecified severity: Secondary | ICD-10-CM

## 2012-10-01 NOTE — Progress Notes (Signed)
Vascular and Vein Specialist of [facility]  Patient name: Sherri Brooks MRN: [id number] DOB: 01/13/1946 Sex: female  REASON FOR VISIT: follow up of wound on left transmetatarsal amputation site.  HPI: Sherri Brooks is a 66 y.o. female who has undergone a previous left femoral to below knee popliteal artery bypass graft in October 2011 with a prosthetic graft as her vein was not adequate. This silently occluded. I saw her in September with a wound on the lateral aspect of her transmetatarsal amputation site. She underwent an arteriogram and was found to have a very tight left external iliac artery stenosis which I successfully ballooned and stented. She had occlusion of the superficial femoral artery on the left with reconstitution of the below knee popliteal artery the anterior tibial artery on the left with his was occluded. The dominant runoff was the peroneal artery. There was diffuse disease in the posterior tibial artery which was small.  She feels that the wound is gradually improving. She continues to smoke 4-5 cigarettes a day. We have discussed the importance of getting off cigarettes completely as this by itself would relieve the vasospasm in the microcirculation and significantly improve her chances of healing this wound.   REVIEW OF SYSTEMS: Arly.Keller ] denotes positive finding; [  ] denotes negative finding  CARDIOVASCULAR:  [ ]  chest pain   [ ]  dyspnea on exertion    CONSTITUTIONAL:  [ ]  fever   [ ]  chills  PHYSICAL EXAM: Filed Vitals:   10/01/12 1246  BP: 154/61  Pulse: 89  Height: 5\' 3"  (1.6 m)  Weight: 124 lb (56.246 kg)  SpO2: 100%   Body mass index is 21.97 kg/(m^2). GENERAL: The patient is a well-nourished female, in no acute distress. The vital signs are documented above. CARDIOVASCULAR: There is a regular rate and rhythm. He has a good left femoral pulse.  PULMONARY: There is good air exchange bilaterally without wheezing or rales. A wound on her transmetatarsal  amputation site measures 2.5 cm x 2 cm in diameter.  I have independently interpreted her vein mapping today which shows that the greater saphenous vein on the right is too small and could not be used as a bypass conduit.  I have independently interpreted her arterial Doppler study which shows triphasic Doppler signals in the right posterior tibial position with an ABI of 89%. She has a monophasic posterior tibial signal on the left with no dorsalis pedis signal on the left. ABI on the left is 30%.  MEDICAL ISSUES: We have discussed the importance of tobacco cessation and I have explained that if she is able to get off cigarettes completely this will significantly improve her chances of wound healing.  Atherosclerosis of native arteries of the extremities with ulceration(440.23) This patient has a wound on the lateral aspect of her left transmetatarsal amputation which she feels is gradually improving. She is undergoing hyperbaric oxygen at the wound care center. He did have successful angioplasty and stenting of a tight left external iliac artery stenosis on 07/28/2012. However she has a superficial femoral artery occlusion below that and a chronically occluded femoropopliteal graft. We have map her vein in the she does not have any usable vein in the right leg or left leg for a bypass. If the wound is gradually healing then we can hold off on revascularization. However if this does not improve then she can be scheduled for a left femoral to below knee popliteal artery bypass with a prosthetic graft. I'll see her  back in 2 months and less I hear back from the wound care center or from the patient at the wound is not healing adequately.   DICKSON,CHRISTOPHER S [facility]ists of [place] Beeper: [phone]

## 2012-10-01 NOTE — Assessment & Plan Note (Signed)
This patient has a wound on the lateral aspect of her left transmetatarsal amputation which she feels is gradually improving. She is undergoing hyperbaric oxygen at the wound care center. He did have successful angioplasty and stenting of a tight left external iliac artery stenosis on 07/28/2012. However she has a superficial femoral artery occlusion below that and a chronically occluded femoropopliteal graft. We have map her vein in the she does not have any usable vein in the right leg or left leg for a bypass. If the wound is gradually healing then we can hold off on revascularization. However if this does not improve then she can be scheduled for a left femoral to below knee popliteal artery bypass with a prosthetic graft. I'll see her back in 2 months and less I hear back from the wound care center or from the patient at the wound is not healing adequately.

## 2012-10-24 ENCOUNTER — Telehealth: Payer: Self-pay

## 2012-10-24 NOTE — Telephone Encounter (Signed)
Daughter called to report pt. has had excruciating pain in left ankle and bottom of foot, past several days.  Continues to receive Hyperbaric Oxygen Tx. at the Wound Center.  States pt. rates her pain at level 10/10.  Daughter states pt. had swelling in the left foot /lower leg, but it has improved.  Daughter denies any decrease in sensation of left lower extremity.  Denies any change in color/temp. Of Left LE.  Discussed with Dr. Imogene Burn.  Recommended if pt. Experienced motor loss, sensation loss, or pain is uncontrollable, she should go to the ER.  Recommends to have her f/u with Dr. Edilia Bo, otherwise.  Advised daughter of the above recommendations. Verb. Understanding.  Appt. Will be sched. For pt. To f/u with Dr. Edilia Bo.

## 2012-10-25 ENCOUNTER — Emergency Department (HOSPITAL_COMMUNITY)
Admission: EM | Admit: 2012-10-25 | Discharge: 2012-10-25 | Disposition: A | Discharge status: Home / Self Care | Payer: PRIVATE HEALTH INSURANCE | Attending: Emergency Medicine | Admitting: Emergency Medicine

## 2012-10-25 ENCOUNTER — Encounter (HOSPITAL_COMMUNITY): Payer: Self-pay

## 2012-10-25 DIAGNOSIS — Z79899 Other long term (current) drug therapy: Secondary | ICD-10-CM | POA: Insufficient documentation

## 2012-10-25 DIAGNOSIS — F17200 Nicotine dependence, unspecified, uncomplicated: Secondary | ICD-10-CM | POA: Insufficient documentation

## 2012-10-25 DIAGNOSIS — E785 Hyperlipidemia, unspecified: Secondary | ICD-10-CM | POA: Insufficient documentation

## 2012-10-25 DIAGNOSIS — M79673 Pain in unspecified foot: Secondary | ICD-10-CM

## 2012-10-25 DIAGNOSIS — I739 Peripheral vascular disease, unspecified: Secondary | ICD-10-CM | POA: Insufficient documentation

## 2012-10-25 DIAGNOSIS — Z8719 Personal history of other diseases of the digestive system: Secondary | ICD-10-CM | POA: Insufficient documentation

## 2012-10-25 DIAGNOSIS — Z8619 Personal history of other infectious and parasitic diseases: Secondary | ICD-10-CM | POA: Insufficient documentation

## 2012-10-25 DIAGNOSIS — Z8739 Personal history of other diseases of the musculoskeletal system and connective tissue: Secondary | ICD-10-CM | POA: Insufficient documentation

## 2012-10-25 DIAGNOSIS — M79609 Pain in unspecified limb: Secondary | ICD-10-CM | POA: Insufficient documentation

## 2012-10-25 DIAGNOSIS — I10 Essential (primary) hypertension: Secondary | ICD-10-CM | POA: Insufficient documentation

## 2012-10-25 DIAGNOSIS — I2510 Atherosclerotic heart disease of native coronary artery without angina pectoris: Secondary | ICD-10-CM | POA: Insufficient documentation

## 2012-10-25 DIAGNOSIS — E119 Type 2 diabetes mellitus without complications: Secondary | ICD-10-CM | POA: Insufficient documentation

## 2012-10-25 DIAGNOSIS — Z7982 Long term (current) use of aspirin: Secondary | ICD-10-CM | POA: Insufficient documentation

## 2012-10-25 DIAGNOSIS — I252 Old myocardial infarction: Secondary | ICD-10-CM | POA: Insufficient documentation

## 2012-10-25 MED ORDER — HYDROMORPHONE HCL PF 1 MG/ML IJ SOLN
1.0000 mg | Freq: Once | INTRAMUSCULAR | Status: AC
  Administered 2012-10-25: 1 mg via INTRAMUSCULAR
  Filled 2012-10-25: qty 1

## 2012-10-25 MED ORDER — OXYCODONE-ACETAMINOPHEN 5-325 MG PO TABS: 1.0000 | ORAL_TABLET | Freq: Four times a day (QID) | ORAL | Status: DC | PRN

## 2012-10-25 NOTE — ED Notes (Signed)
Patient's foot was re-bandaged.  Boot was placed back on foot.

## 2012-10-25 NOTE — ED Notes (Signed)
Pt's belongings bagged and locked in closet, wanded by security. Family at bedside.

## 2012-10-25 NOTE — ED Provider Notes (Signed)
History    Scribed for Benny Lennert, MD, the patient was seen in room APA03/APA03. This chart was scribed by Katha Cabal.   CSN: [id number]  Arrival date & time 10/25/12  1330   First MD Initiated Contact with Patient 10/25/12 1344      Chief Complaint  Patient presents with  . Leg Pain  . V70.1    (Consider location/radiation/quality/duration/timing/severity/associated sxs/prior Treatment)  Benny Lennert, MD entered patient's room at 1:46 PM   Patient is a 67 y.o. female presenting with leg pain. The history is provided by the patient and a relative. No language interpreter was used.  Leg Pain  The incident occurred more than 1 week ago. The injury mechanism was an incision. The pain is present in the left foot. The pain is severe. The pain has been constant since onset. Associated symptoms comments: Muscle spasms. Treatments tried: Neurontin  and Ibuprofen. The treatment provided no relief.    Pain gradually worsening.  Patient seen at wound center yesterday.  Patient reports some relief with oxycodone.  Denies SI and HI. States she can not take the pain.     PCP Lenise Herald, PA       Past Medical History  Diagnosis Date  . Diabetes mellitus   . Hypertension   . Hyperlipidemia   . PVD (peripheral vascular disease)   . Cellulitis of buttock, left 2010  . Arthritis   . GERD (gastroesophageal reflux disease)   . CAD (coronary artery disease)   . MI (myocardial infarction)     Past Surgical History  Procedure Date  . Femoral-popliteal bypass graft 07/2010  . Right common iliac stent 10/2009    Dr Durwin Nora ([place])  . Tonsillectomy   . S/p hysterecotmy     partial  . Foot amputation 2010    left  . Colonoscopy 10/26/2011    Procedure: COLONOSCOPY;  Surgeon: Arlyce Harman, MD;  Location: [facility]ITE;  Service: Endoscopy;  Laterality: N/A;  1:30    Family History  Problem Relation Age of Onset  . Brain cancer Father   . Cancer Father   . Diabetes  Mother     many family members  . Alzheimer's disease Mother     History  Substance Use Topics  . Smoking status: Current Every Day Smoker -- 0.2 packs/day for 20 years    Types: Cigarettes  . Smokeless tobacco: Never Used     Comment: pt states that she wants to quit and is going to talk to PCP about an aid  . Alcohol Use: No    OB History    Grav Para Term Preterm Abortions TAB SAB Ect Mult Living                  Review of Systems  All other systems reviewed and are negative.  Remaining review of systems negative except as noted in the HPI.    Allergies  Iohexol  Home Medications   Current Outpatient Rx  Name  Route  Sig  Dispense  Refill  . ALENDRONATE SODIUM 70 MG PO TABS   Oral   Take 70 mg by mouth every 7 (seven) days. On Mondays         . AMLODIPINE BESYLATE 5 MG PO TABS   Oral   Take 5 mg by mouth daily.         . ASPIRIN 325 MG PO TBEC   Oral   Take 325 mg by mouth daily.           Marland Kitchen  CLOPIDOGREL BISULFATE 75 MG PO TABS   Oral   Take 75 mg by mouth daily.           . INSULIN GLARGINE 100 UNIT/ML [place] SOLN   Subcutaneous   Inject 10 Units into the skin at bedtime.         Marland Kitchen LABETALOL HCL 200 MG PO TABS   Oral   Take 200 mg by mouth 2 (two) times daily.           Marland Kitchen LORAZEPAM 0.5 MG PO TABS   Oral   Take 0.5 mg by mouth every 8 (eight) hours as needed. anxiety         . ADULT MULTIVITAMIN W/MINERALS CH   Oral   Take 1 tablet by mouth daily.         . OXYCODONE-ACETAMINOPHEN 5-325 MG PO TABS   Oral   Take 1-2 tablets by mouth every 4 (four) hours as needed for pain.   30 tablet   0   . SIMVASTATIN 40 MG PO TABS   Oral   Take 40 mg by mouth at bedtime.            BP 177/61  Pulse 85  Temp 97.9 F (36.6 C) (Oral)  Resp 20  Ht 5\' 3"  (1.6 m)  Wt 120 lb (54.432 kg)  BMI 21.26 kg/m2  SpO2 98%  Physical Exam  Constitutional: She is oriented to person, place, and time. She appears well-developed.  HENT:  Head:  Normocephalic.  Eyes: Conjunctivae normal are normal.  Neck: No tracheal deviation present.  Cardiovascular: Intact distal pulses.   No murmur heard. Musculoskeletal: Normal range of motion.       Amputated front left foot, poor healed scar, black areas at the end of food, no erythema, 1+ DP   Neurological: She is alert and oriented to person, place, and time.  Skin: Skin is warm.  Psychiatric: She has a normal mood and affect. She expresses no homicidal and no suicidal ideation. She expresses no suicidal plans and no homicidal plans.    ED Course  Procedures (including critical care time)    DIAGNOSTIC STUDIES: Oxygen Saturation is 98% on room air normal by my interpretation.     COORDINATION OF CARE: 1:55 PM  Physical exam complete.  Pain control.   2:15 PM  Dilaudid ordered.     LABS / RADIOLOGY:   Labs Reviewed - No data to display No results found.         IMPRESSION: No diagnosis found.   NEW MEDICATIONS: New Prescriptions   No medications on file      Pt improved with tx    MDM         The chart was scribed for me under my direct supervision.  I personally performed the history, physical, and medical decision making and all procedures in the evaluation of this patient.Benny Lennert, MD 10/25/12 [phone]

## 2012-10-25 NOTE — ED Notes (Signed)
Pt has wound to left foot and pt c/o pain in leg and foot.  Reports pain has been getting more severe all week.  Reports has wound care at the wound center.  Last treatment was Friday.

## 2012-10-25 NOTE — ED Notes (Addendum)
Patient states that she started having increasing pain to her left foot last week.  States that she had a partial amputation last year. Denies SI/HI.   States she is currently undergoing treatment in hyperbarics for the wound to her left foot.  Wound noted to lateral left foot.

## 2012-10-25 NOTE — ED Notes (Signed)
Per dr.zammit pt does not need sitter, stated she is depressed over recent illness denies any si/hi.  Family at bedside. Belongings returned.

## 2012-10-27 ENCOUNTER — Telehealth: Payer: Self-pay | Admitting: Vascular Surgery

## 2012-10-27 NOTE — Telephone Encounter (Signed)
Please sched appt. with Dr. Edilia Bo 10/29/12/ c/o excruciating pain in left ankle and foot/ no vasc lab at this time, since has know Fem-Pop occlusion; call daughter Marylene Land @ [phone] Notified daughter, Marylene Land, of appt for her mother on 10-29-12 at 11:30.

## 2012-10-28 ENCOUNTER — Encounter: Payer: Self-pay | Admitting: Vascular Surgery

## 2012-10-29 ENCOUNTER — Ambulatory Visit (INDEPENDENT_AMBULATORY_CARE_PROVIDER_SITE_OTHER): Payer: PRIVATE HEALTH INSURANCE | Admitting: Vascular Surgery

## 2012-10-29 ENCOUNTER — Encounter: Payer: Self-pay | Admitting: Vascular Surgery

## 2012-10-29 ENCOUNTER — Other Ambulatory Visit: Payer: Self-pay

## 2012-10-29 VITALS — BP 178/60 | HR 82 | Temp 98.1°F | Ht 63.0 in | Wt 132.1 lb

## 2012-10-29 DIAGNOSIS — I739 Peripheral vascular disease, unspecified: Secondary | ICD-10-CM

## 2012-10-29 DIAGNOSIS — L98499 Non-pressure chronic ulcer of skin of other sites with unspecified severity: Secondary | ICD-10-CM

## 2012-10-29 NOTE — Progress Notes (Signed)
Vascular and Vein Specialist of [facility]  Patient name: Sherri Brooks MRN: [id number] DOB: [birth year] Sex: female  CC: nonhealing left transmetatarsal amputation and rest pain of left foot.  HPI: Sherri Brooks is a 67 y.o. female who had originally presented with an extensive infection of her left foot in 2011. She underwent left femoral to below knee popliteal artery bypass grafting with a 6 mm PTFE graft and left transmetatarsal amputation. I had explored her greater saphenous vein on the left and this was not adequate to use as a bypass conduit. She presented with a nonhealing wound on the lateral aspect of her left transmetatarsal amputation site. Her graft was found to be silently occluded on the left. She underwent an arteriogram on 07/28/2012. She had a very tight stenosis of the left external iliac artery which was successfully ballooned and stented. She had diffuse infrainguinal arterial occlusive disease. Her superficial femoral artery was occluded with reconstitution of the below-knee pop artery and the dominant runoff been peroneal artery on the left. She also has significant infrainguinal arterial occlusive disease on the right  She has had progressive rest pain of her left transmetatarsal amputation site especially along the lateral aspect near the left ankle. He does continue to smoke but is cut back to 3-4 cigarettes a day. She was seen in the office today complaining of progressive pain in the left foot and wishes to consider redo revascularization  Past Medical History  Diagnosis Date  . Diabetes mellitus   . Hypertension   . Hyperlipidemia   . PVD (peripheral vascular disease)   . Cellulitis of buttock, left 2010  . Arthritis   . GERD (gastroesophageal reflux disease)   . CAD (coronary artery disease)   . MI (myocardial infarction)     Family History  Problem Relation Age of Onset  . Brain cancer Father   . Cancer Father   . Diabetes Mother     many family members    . Alzheimer's disease Mother     SOCIAL HISTORY: History  Substance Use Topics  . Smoking status: Current Every Day Smoker -- 0.4 packs/day for 20 years    Types: Cigarettes  . Smokeless tobacco: Never Used     Comment: states she only smokes because of the pain  . Alcohol Use: No    Allergies  Allergen Reactions  . Iohexol      Code: HIVES, Desc: PT STATES SHE EXPERIENCED ITCHING W/ IV DYE IN PAST-ARS 01/21/10, Onset Date: [id number]     Current Outpatient Prescriptions  Medication Sig Dispense Refill  . albuterol (PROVENTIL HFA;VENTOLIN HFA) 108 (90 BASE) MCG/ACT inhaler Inhale 2 puffs into the lungs every 6 (six) hours as needed. For bronchitis and coughing      . alendronate (FOSAMAX) 70 MG tablet Take 70 mg by mouth every 7 (seven) days. On Mondays      . aspirin 325 MG EC tablet Take 325 mg by mouth daily.        . clopidogrel (PLAVIX) 75 MG tablet Take 75 mg by mouth daily.        . hydrochlorothiazide (HYDRODIURIL) 25 MG tablet Take 25 mg by mouth daily.      Marland Kitchen HYDROmorphone (DILAUDID) 2 MG tablet Take 2 mg by mouth every 8 (eight) hours as needed. pain      . ibuprofen (ADVIL,MOTRIN) 200 MG tablet Take 200 mg by mouth every 6 (six) hours as needed. For pain      . insulin glargine (  LANTUS) 100 UNIT/ML injection Inject 10 Units into the skin at bedtime.      Marland Kitchen labetalol (NORMODYNE) 200 MG tablet Take 200 mg by mouth 2 (two) times daily.        Marland Kitchen LORazepam (ATIVAN) 0.5 MG tablet Take 0.5 mg by mouth at bedtime. anxiety      . Multiple Vitamin (MULITIVITAMIN WITH MINERALS) TABS Take 1 tablet by mouth daily.      Marland Kitchen oxyCODONE-acetaminophen (PERCOCET/ROXICET) 5-325 MG per tablet Take 1 tablet by mouth every 6 (six) hours as needed for pain.  30 tablet  0  . simvastatin (ZOCOR) 40 MG tablet Take 40 mg by mouth at bedtime.       . diazepam (VALIUM) 5 MG tablet Take 5 mg by mouth daily as needed. Takes 15 minutes prior to office visit for HyperChamber therapy        REVIEW OF  SYSTEMS: Arly.Keller ] denotes positive finding; [  ] denotes negative finding CARDIOVASCULAR:  [ ]  chest pain   [ ]  chest pressure   [ ]  palpitations   [ ]  orthopnea   [ ]  dyspnea on exertion   Arly.Keller ] claudication   Arly.Keller ] rest pain   [ ]  DVT   [ ]  phlebitis PULMONARY:   [ ]  productive cough   [ ]  asthma   [ ]  wheezing NEUROLOGIC:   [ ]  weakness  [ ]  paresthesias  [ ]  aphasia  [ ]  amaurosis  [ ]  dizziness HEMATOLOGIC:   [ ]  bleeding problems   [ ]  clotting disorders MUSCULOSKELETAL:  [ ]  joint pain   [ ]  joint swelling Arly.Keller ] leg swelling GASTROINTESTINAL: [ ]   blood in stool  [ ]   hematemesis GENITOURINARY:  [ ]   dysuria  [ ]   hematuria PSYCHIATRIC:  [ ]  history of major depression INTEGUMENTARY:  [ ]  rashes  [ ]  ulcers CONSTITUTIONAL:  [ ]  fever   [ ]  chills  PHYSICAL EXAM: Filed Vitals:   10/29/12 1138  BP: 178/60  Pulse: 82  Temp: 98.1 F (36.7 C)  TempSrc: Oral  Height: 5\' 3"  (1.6 m)  Weight: 132 lb 1.6 oz (59.92 kg)  SpO2: 100%   Body mass index is 23.40 kg/(m^2). GENERAL: The patient is a well-nourished female, in no acute distress. The vital signs are documented above. CARDIOVASCULAR: There is a regular rate and rhythm without significant murmur appreciated. She has bilateral carotid bruits. She has palpable femoral pulses. I cannot palpate popliteal or pedal pulses.  PULMONARY: There is good air exchange bilaterally without wheezing or rales. ABDOMEN: Soft and non-tender with normal pitched bowel sounds. I do not palpate an abdominal aortic aneurysm. MUSCULOSKELETAL: she has a left transmetatarsal amputation. NEUROLOGIC: No focal weakness or paresthesias are detected. SKIN: there is an open ulcer on the lateral aspect of her left transmetatarsal amputation. PSYCHIATRIC: The patient has a normal affect.  DATA:  Have reviewed his arteriogram. The results are described above.  MEDICAL ISSUES: This patient presents with a nonhealing wound of the left transmetatarsal amputation site with  severe rest pain left foot. I think the only remaining option for revascularization would be to attempt a redo left femoral to below knee pop 2 artery bypass graft with a prosthetic graft. I would not want to take vein from the right leg given her severe infrainguinal arterial occlusive disease on the right.I have reviewed the indications for lower extremity bypass. I have also reviewed the potential complications of surgery including but not  limited to: wound healing problems, infection, graft thrombosis, limb loss, or other unpredictable medical problems. All the patient's questions were answered and they are agreeable to proceed. She understands that this is not successful she will require likely a below the knee amputation on the left. Her surgery is scheduled for 11/04/2012.  Willye Javier S [facility] of [place] Office: [phone]

## 2012-10-30 ENCOUNTER — Encounter (HOSPITAL_COMMUNITY): Payer: Self-pay

## 2012-11-03 ENCOUNTER — Inpatient Hospital Stay (HOSPITAL_COMMUNITY)
Admission: EM | Admit: 2012-11-03 | Discharge: 2012-11-07 | DRG: 254 | Disposition: A | Discharge status: Home Health | Payer: PRIVATE HEALTH INSURANCE | Attending: Vascular Surgery | Admitting: Vascular Surgery

## 2012-11-03 ENCOUNTER — Inpatient Hospital Stay (HOSPITAL_COMMUNITY): Payer: PRIVATE HEALTH INSURANCE

## 2012-11-03 ENCOUNTER — Other Ambulatory Visit: Payer: Self-pay

## 2012-11-03 ENCOUNTER — Encounter (HOSPITAL_COMMUNITY): Payer: Self-pay

## 2012-11-03 ENCOUNTER — Encounter (HOSPITAL_COMMUNITY)
Admission: RE | Admit: 2012-11-03 | Discharge: 2012-11-03 | Disposition: A | Discharge status: Home / Self Care | Payer: PRIVATE HEALTH INSURANCE | Source: Ambulatory Visit | Attending: Vascular Surgery | Admitting: Vascular Surgery

## 2012-11-03 DIAGNOSIS — D649 Anemia, unspecified: Secondary | ICD-10-CM | POA: Diagnosis present

## 2012-11-03 DIAGNOSIS — L98499 Non-pressure chronic ulcer of skin of other sites with unspecified severity: Secondary | ICD-10-CM | POA: Diagnosis present

## 2012-11-03 DIAGNOSIS — Z7982 Long term (current) use of aspirin: Secondary | ICD-10-CM

## 2012-11-03 DIAGNOSIS — S91302A Unspecified open wound, left foot, initial encounter: Secondary | ICD-10-CM

## 2012-11-03 DIAGNOSIS — E119 Type 2 diabetes mellitus without complications: Secondary | ICD-10-CM | POA: Diagnosis present

## 2012-11-03 DIAGNOSIS — K219 Gastro-esophageal reflux disease without esophagitis: Secondary | ICD-10-CM | POA: Diagnosis present

## 2012-11-03 DIAGNOSIS — E118 Type 2 diabetes mellitus with unspecified complications: Secondary | ICD-10-CM | POA: Diagnosis present

## 2012-11-03 DIAGNOSIS — I252 Old myocardial infarction: Secondary | ICD-10-CM

## 2012-11-03 DIAGNOSIS — S98919A Complete traumatic amputation of unspecified foot, level unspecified, initial encounter: Secondary | ICD-10-CM

## 2012-11-03 DIAGNOSIS — IMO0002 Reserved for concepts with insufficient information to code with codable children: Secondary | ICD-10-CM | POA: Diagnosis present

## 2012-11-03 DIAGNOSIS — I2510 Atherosclerotic heart disease of native coronary artery without angina pectoris: Secondary | ICD-10-CM | POA: Diagnosis present

## 2012-11-03 DIAGNOSIS — E1165 Type 2 diabetes mellitus with hyperglycemia: Secondary | ICD-10-CM | POA: Diagnosis present

## 2012-11-03 DIAGNOSIS — I70209 Unspecified atherosclerosis of native arteries of extremities, unspecified extremity: Secondary | ICD-10-CM

## 2012-11-03 DIAGNOSIS — L97509 Non-pressure chronic ulcer of other part of unspecified foot with unspecified severity: Secondary | ICD-10-CM | POA: Diagnosis present

## 2012-11-03 DIAGNOSIS — Z01812 Encounter for preprocedural laboratory examination: Secondary | ICD-10-CM

## 2012-11-03 DIAGNOSIS — I70269 Atherosclerosis of native arteries of extremities with gangrene, unspecified extremity: Secondary | ICD-10-CM

## 2012-11-03 DIAGNOSIS — F17200 Nicotine dependence, unspecified, uncomplicated: Secondary | ICD-10-CM | POA: Diagnosis present

## 2012-11-03 DIAGNOSIS — I10 Essential (primary) hypertension: Secondary | ICD-10-CM | POA: Diagnosis present

## 2012-11-03 DIAGNOSIS — Z833 Family history of diabetes mellitus: Secondary | ICD-10-CM

## 2012-11-03 DIAGNOSIS — Z794 Long term (current) use of insulin: Secondary | ICD-10-CM

## 2012-11-03 DIAGNOSIS — I739 Peripheral vascular disease, unspecified: Secondary | ICD-10-CM | POA: Diagnosis present

## 2012-11-03 DIAGNOSIS — Z7902 Long term (current) use of antithrombotics/antiplatelets: Secondary | ICD-10-CM

## 2012-11-03 DIAGNOSIS — E785 Hyperlipidemia, unspecified: Secondary | ICD-10-CM | POA: Diagnosis present

## 2012-11-03 DIAGNOSIS — E162 Hypoglycemia, unspecified: Secondary | ICD-10-CM

## 2012-11-03 DIAGNOSIS — Z79899 Other long term (current) drug therapy: Secondary | ICD-10-CM

## 2012-11-03 DIAGNOSIS — E1159 Type 2 diabetes mellitus with other circulatory complications: Principal | ICD-10-CM | POA: Diagnosis present

## 2012-11-03 LAB — CBC WITH DIFFERENTIAL/PLATELET
Basophils Absolute: 0 10*3/uL (ref 0.0–0.1)
Basophils Relative: 0 % (ref 0–1)
Eosinophils Absolute: 0.1 10*3/uL (ref 0.0–0.7)
Eosinophils Relative: 1 % (ref 0–5)
HCT: 22.9 % — ABNORMAL LOW (ref 36.0–46.0)
Hemoglobin: 7.4 g/dL — ABNORMAL LOW (ref 12.0–15.0)
Lymphocytes Relative: 17 % (ref 12–46)
Lymphs Abs: 1.8 10*3/uL (ref 0.7–4.0)
MCH: 24.6 pg — ABNORMAL LOW (ref 26.0–34.0)
MCHC: 32.3 g/dL (ref 30.0–36.0)
MCV: 76.1 fL — ABNORMAL LOW (ref 78.0–100.0)
Monocytes Absolute: 0.8 10*3/uL (ref 0.1–1.0)
Monocytes Relative: 8 % (ref 3–12)
Neutro Abs: 7.5 10*3/uL (ref 1.7–7.7)
Neutrophils Relative %: 73 % (ref 43–77)
Platelets: 380 10*3/uL (ref 150–400)
RBC: 3.01 MIL/uL — ABNORMAL LOW (ref 3.87–5.11)
RDW: 15.8 % — ABNORMAL HIGH (ref 11.5–15.5)
WBC: 10.2 10*3/uL (ref 4.0–10.5)

## 2012-11-03 LAB — COMPREHENSIVE METABOLIC PANEL
Alkaline Phosphatase: 102 U/L (ref 39–117)
BUN: 24 mg/dL — ABNORMAL HIGH (ref 6–23)
CO2: 26 mEq/L (ref 19–32)
Calcium: 9.3 mg/dL (ref 8.4–10.5)
GFR calc Af Amer: 64 mL/min — ABNORMAL LOW (ref >90)
GFR calc non Af Amer: 56 mL/min — ABNORMAL LOW (ref >90)
Glucose, Bld: 140 mg/dL — ABNORMAL HIGH (ref 70–99)
Total Protein: 7.6 g/dL (ref 6.0–8.3)

## 2012-11-03 LAB — BASIC METABOLIC PANEL WITH GFR
BUN: 27 mg/dL — ABNORMAL HIGH (ref 6–23)
CO2: 23 meq/L (ref 19–32)
Calcium: 9.3 mg/dL (ref 8.4–10.5)
Chloride: 104 meq/L (ref 96–112)
Creatinine, Ser: 1.02 mg/dL (ref 0.50–1.10)
GFR calc Af Amer: 64 mL/min — ABNORMAL LOW
GFR calc non Af Amer: 56 mL/min — ABNORMAL LOW
Glucose, Bld: 64 mg/dL — ABNORMAL LOW (ref 70–99)
Potassium: 4.2 meq/L (ref 3.5–5.1)
Sodium: 139 meq/L (ref 135–145)

## 2012-11-03 LAB — CBC
HCT: 25.3 % — ABNORMAL LOW (ref 36.0–46.0)
Hemoglobin: 8.4 g/dL — ABNORMAL LOW (ref 12.0–15.0)
MCH: 25.5 pg — ABNORMAL LOW (ref 26.0–34.0)
MCHC: 33.2 g/dL (ref 30.0–36.0)

## 2012-11-03 LAB — URINALYSIS, ROUTINE W REFLEX MICROSCOPIC
Ketones, ur: NEGATIVE mg/dL
Leukocytes, UA: NEGATIVE
Nitrite: NEGATIVE
Protein, ur: NEGATIVE mg/dL
Urobilinogen, UA: 1 mg/dL (ref 0.0–1.0)
pH: 7 (ref 5.0–8.0)

## 2012-11-03 LAB — GLUCOSE, CAPILLARY
Glucose-Capillary: 35 mg/dL — CL (ref 70–99)
Glucose-Capillary: 68 mg/dL — ABNORMAL LOW (ref 70–99)
Glucose-Capillary: 267 mg/dL — ABNORMAL HIGH (ref 70–99)
Glucose-Capillary: 285 mg/dL — ABNORMAL HIGH (ref 70–99)
Glucose-Capillary: 127 mg/dL — ABNORMAL HIGH (ref 70–99)

## 2012-11-03 MED ORDER — ADULT MULTIVITAMIN W/MINERALS CH
1.0000 | ORAL_TABLET | Freq: Every day | ORAL | Status: DC
  Administered 2012-11-03 – 2012-11-05 (×2): 1 via ORAL
  Filled 2012-11-03 (×3): qty 1

## 2012-11-03 MED ORDER — ONDANSETRON HCL 4 MG/2ML IJ SOLN: 4.0000 mg | Freq: Four times a day (QID) | INTRAMUSCULAR | Status: DC | PRN

## 2012-11-03 MED ORDER — GUAIFENESIN-DM 100-10 MG/5ML PO SYRP: 15.0000 mL | ORAL_SOLUTION | ORAL | Status: DC | PRN

## 2012-11-03 MED ORDER — INSULIN ASPART 100 UNIT/ML ~~LOC~~ SOLN
0.0000 [IU] | Freq: Three times a day (TID) | SUBCUTANEOUS | Status: DC
  Administered 2012-11-03: 8 [IU] via SUBCUTANEOUS
  Administered 2012-11-04: 3 [IU] via SUBCUTANEOUS

## 2012-11-03 MED ORDER — POTASSIUM CHLORIDE CRYS ER 20 MEQ PO TBCR: 20.0000 meq | EXTENDED_RELEASE_TABLET | Freq: Once | ORAL | Status: DC

## 2012-11-03 MED ORDER — CLOPIDOGREL BISULFATE 75 MG PO TABS
75.0000 mg | ORAL_TABLET | Freq: Every day | ORAL | Status: DC
  Administered 2012-11-03 – 2012-11-05 (×2): 75 mg via ORAL
  Filled 2012-11-03 (×3): qty 1

## 2012-11-03 MED ORDER — HYDRALAZINE HCL 20 MG/ML IJ SOLN
10.0000 mg | INTRAMUSCULAR | Status: DC | PRN
  Filled 2012-11-03: qty 0.5

## 2012-11-03 MED ORDER — DEXTROSE 50 % IV SOLN
25.0000 mL | Freq: Once | INTRAVENOUS | Status: AC
  Administered 2012-11-03: 25 mL via INTRAVENOUS
  Filled 2012-11-03: qty 50

## 2012-11-03 MED ORDER — ACETAMINOPHEN 325 MG PO TABS: 325.0000 mg | ORAL_TABLET | ORAL | Status: DC | PRN

## 2012-11-03 MED ORDER — ALENDRONATE SODIUM 70 MG PO TABS: 70.0000 mg | ORAL_TABLET | ORAL | Status: DC

## 2012-11-03 MED ORDER — DEXTROSE 5 % IV SOLN
Freq: Once | INTRAVENOUS | Status: AC
  Administered 2012-11-03: 13:00:00 via INTRAVENOUS

## 2012-11-03 MED ORDER — PHENOL 1.4 % MT LIQD: 1.0000 | OROMUCOSAL | Status: DC | PRN

## 2012-11-03 MED ORDER — DEXTROSE 5 % IV SOLN: 1.5000 g | INTRAVENOUS | Status: DC

## 2012-11-03 MED ORDER — LABETALOL HCL 5 MG/ML IV SOLN
10.0000 mg | INTRAVENOUS | Status: DC | PRN
  Filled 2012-11-03: qty 4

## 2012-11-03 MED ORDER — INSULIN GLARGINE 100 UNIT/ML ~~LOC~~ SOLN
10.0000 [IU] | Freq: Every day | SUBCUTANEOUS | Status: DC
  Administered 2012-11-03 – 2012-11-04 (×2): 10 [IU] via SUBCUTANEOUS

## 2012-11-03 MED ORDER — SENNOSIDES-DOCUSATE SODIUM 8.6-50 MG PO TABS
1.0000 | ORAL_TABLET | Freq: Every evening | ORAL | Status: DC | PRN
  Filled 2012-11-03: qty 1

## 2012-11-03 MED ORDER — SODIUM CHLORIDE 0.9 % IV SOLN: INTRAVENOUS | Status: DC

## 2012-11-03 MED ORDER — BISACODYL 10 MG RE SUPP: 10.0000 mg | Freq: Every day | RECTAL | Status: DC | PRN

## 2012-11-03 MED ORDER — MIDAZOLAM HCL 2 MG/ML PO SYRP: 12.0000 mg | ORAL_SOLUTION | Freq: Once | ORAL | Status: DC

## 2012-11-03 MED ORDER — DOCUSATE SODIUM 100 MG PO CAPS
100.0000 mg | ORAL_CAPSULE | Freq: Two times a day (BID) | ORAL | Status: DC
  Administered 2012-11-04 – 2012-11-05 (×2): 100 mg via ORAL
  Filled 2012-11-03 (×5): qty 1

## 2012-11-03 MED ORDER — SENNA 8.6 MG PO TABS
1.0000 | ORAL_TABLET | Freq: Two times a day (BID) | ORAL | Status: DC
  Administered 2012-11-04 – 2012-11-05 (×2): 8.6 mg via ORAL
  Filled 2012-11-03 (×6): qty 1

## 2012-11-03 MED ORDER — ASPIRIN EC 325 MG PO TBEC
325.0000 mg | DELAYED_RELEASE_TABLET | Freq: Every day | ORAL | Status: DC
  Administered 2012-11-03 – 2012-11-07 (×4): 325 mg via ORAL
  Filled 2012-11-03 (×5): qty 1

## 2012-11-03 MED ORDER — DIAZEPAM 5 MG PO TABS: 5.0000 mg | ORAL_TABLET | Freq: Four times a day (QID) | ORAL | Status: DC | PRN

## 2012-11-03 MED ORDER — LORAZEPAM 0.5 MG PO TABS
0.5000 mg | ORAL_TABLET | Freq: Every day | ORAL | Status: DC
  Administered 2012-11-03 – 2012-11-04 (×2): 0.5 mg via ORAL
  Filled 2012-11-03 (×2): qty 1

## 2012-11-03 MED ORDER — HYDROMORPHONE HCL 2 MG PO TABS
2.0000 mg | ORAL_TABLET | Freq: Three times a day (TID) | ORAL | Status: DC | PRN
  Administered 2012-11-03 – 2012-11-05 (×3): 2 mg via ORAL
  Filled 2012-11-03 (×3): qty 1

## 2012-11-03 MED ORDER — HYDROCHLOROTHIAZIDE 25 MG PO TABS
25.0000 mg | ORAL_TABLET | Freq: Every day | ORAL | Status: DC
  Administered 2012-11-03 – 2012-11-05 (×2): 25 mg via ORAL
  Filled 2012-11-03 (×3): qty 1

## 2012-11-03 MED ORDER — ACETAMINOPHEN 650 MG RE SUPP: 325.0000 mg | RECTAL | Status: DC | PRN

## 2012-11-03 MED ORDER — LABETALOL HCL 200 MG PO TABS
200.0000 mg | ORAL_TABLET | Freq: Two times a day (BID) | ORAL | Status: DC
  Administered 2012-11-03 – 2012-11-05 (×3): 200 mg via ORAL
  Filled 2012-11-03 (×6): qty 1

## 2012-11-03 MED ORDER — DEXTROSE 5 % IV SOLN
1.5000 g | Freq: Once | INTRAVENOUS | Status: AC
  Administered 2012-11-04: 1.5 g via INTRAVENOUS
  Filled 2012-11-03: qty 1.5

## 2012-11-03 MED ORDER — INSULIN ASPART 100 UNIT/ML ~~LOC~~ SOLN: 0.0000 [IU] | Freq: Every day | SUBCUTANEOUS | Status: DC

## 2012-11-03 MED ORDER — MORPHINE SULFATE 2 MG/ML IJ SOLN
2.0000 mg | INTRAMUSCULAR | Status: DC | PRN
  Administered 2012-11-03 (×2): 2 mg via INTRAVENOUS
  Administered 2012-11-04: 4 mg via INTRAVENOUS
  Filled 2012-11-03: qty 2
  Filled 2012-11-03 (×4): qty 1

## 2012-11-03 MED ORDER — SIMVASTATIN 40 MG PO TABS
40.0000 mg | ORAL_TABLET | Freq: Every day | ORAL | Status: DC
  Administered 2012-11-03: 40 mg via ORAL
  Filled 2012-11-03 (×2): qty 1

## 2012-11-03 MED ORDER — PANTOPRAZOLE SODIUM 40 MG PO TBEC
40.0000 mg | DELAYED_RELEASE_TABLET | Freq: Every day | ORAL | Status: DC
  Administered 2012-11-03 – 2012-11-05 (×2): 40 mg via ORAL
  Filled 2012-11-03 (×2): qty 1

## 2012-11-03 MED ORDER — ASPIRIN EC 325 MG PO TBEC: 325.0000 mg | DELAYED_RELEASE_TABLET | Freq: Every day | ORAL | Status: DC

## 2012-11-03 MED ORDER — ALUM & MAG HYDROXIDE-SIMETH 200-200-20 MG/5ML PO SUSP: 15.0000 mL | ORAL | Status: DC | PRN

## 2012-11-03 MED ORDER — ALBUTEROL SULFATE HFA 108 (90 BASE) MCG/ACT IN AERS: 2.0000 | INHALATION_SPRAY | Freq: Four times a day (QID) | RESPIRATORY_TRACT | Status: DC | PRN

## 2012-11-03 MED ORDER — METOPROLOL TARTRATE 1 MG/ML IV SOLN: 2.0000 mg | INTRAVENOUS | Status: DC | PRN

## 2012-11-03 NOTE — ED Notes (Signed)
Pt A/O x 4 after 1/2 Amp dextrose and IV infusion started. PT asking what happened and is sitting up in bed eating meal. Pt reports taking 25 units of what was thought to be Lantus insulin. The family reports pt took Novalog insulin.

## 2012-11-03 NOTE — ED Notes (Signed)
Pt brought straight back from triage via wheelchair by Darrell, EMT; myself and Herbert Seta, EMT assisted pt to the stretcher, undressed pt, placed in gown, on monitor, continuous pulse oximetry and blood pressure cuff; Heather, EMT performed a CBG; family at bedside

## 2012-11-03 NOTE — H&P (Signed)
[facility] Specialist of [facility]   11-03-2012  The patient was dizzy and weak feeling.  She presented with a glucose of 35 and hemoglobin of 7.4.  She was scheduled for surgery tomorrow left Fem-pop by-pass by Dr. Edilia Bo.  We will admit her and transfuse 1 unit of PRC and monitor her glucose levels.  She has seen a DR. Sandi Fields in [place], [place] the GI for colonoscopy in the past 10/26/2011.       CC: nonhealing left transmetatarsal amputation and rest pain of left foot.  HPI:  Sherri Brooks is a 67 y.o. female who had originally presented with an extensive infection of her left foot in 2011. She underwent left femoral to below knee popliteal artery bypass grafting with a 6 mm PTFE graft and left transmetatarsal amputation. I had explored her greater saphenous vein on the left and this was not adequate to use as a bypass conduit. She presented with a nonhealing wound on the lateral aspect of her left transmetatarsal amputation site. Her graft was found to be silently occluded on the left. She underwent an arteriogram on 07/28/2012. She had a very tight stenosis of the left external iliac artery which was successfully ballooned and stented. She had diffuse infrainguinal arterial occlusive disease. Her superficial femoral artery was occluded with reconstitution of the below-knee pop artery and the dominant runoff been peroneal artery on the left. She also has significant infrainguinal arterial occlusive disease on the right  She has had progressive rest pain of her left transmetatarsal amputation site especially along the lateral aspect near the left ankle. He does continue to smoke but is cut back to 3-4 cigarettes a day. She was seen in the office today complaining of progressive pain in the left foot and wishes to consider redo revascularization  Past Medical History   Diagnosis  Date   .  Diabetes mellitus    .  Hypertension    .  Hyperlipidemia    .  PVD (peripheral vascular disease)     .  Cellulitis of buttock, left  2010   .  Arthritis    .  GERD (gastroesophageal reflux disease)    .  CAD (coronary artery disease)    .  MI (myocardial infarction)     Family History   Problem  Relation  Age of Onset   .  Brain cancer  Father    .  Cancer  Father    .  Diabetes  Mother       many family members    .  Alzheimer's disease  Mother     SOCIAL HISTORY:  History   Substance Use Topics   .  Smoking status:  Current Every Day Smoker -- 0.4 packs/day for 20 years     Types:  Cigarettes   .  Smokeless tobacco:  Never Used      Comment: states she only smokes because of the pain   .  Alcohol Use:  No    Allergies   Allergen  Reactions   .  Iohexol      Code: HIVES, Desc: PT STATES SHE EXPERIENCED ITCHING W/ IV DYE IN PAST-ARS 01/21/10, Onset Date: [id number]    Current Outpatient Prescriptions   Medication  Sig  Dispense  Refill   .  albuterol (PROVENTIL HFA;VENTOLIN HFA) 108 (90 BASE) MCG/ACT inhaler  Inhale 2 puffs into the lungs every 6 (six) hours as needed. For bronchitis and coughing     .  alendronate (FOSAMAX) 70 MG tablet  Take 70 mg by mouth every 7 (seven) days. On Mondays     .  aspirin 325 MG EC tablet  Take 325 mg by mouth daily.     .  clopidogrel (PLAVIX) 75 MG tablet  Take 75 mg by mouth daily.     .  hydrochlorothiazide (HYDRODIURIL) 25 MG tablet  Take 25 mg by mouth daily.     Marland Kitchen  HYDROmorphone (DILAUDID) 2 MG tablet  Take 2 mg by mouth every 8 (eight) hours as needed. pain     .  ibuprofen (ADVIL,MOTRIN) 200 MG tablet  Take 200 mg by mouth every 6 (six) hours as needed. For pain     .  insulin glargine (LANTUS) 100 UNIT/ML injection  Inject 10 Units into the skin at bedtime.     Marland Kitchen  labetalol (NORMODYNE) 200 MG tablet  Take 200 mg by mouth 2 (two) times daily.     Marland Kitchen  LORazepam (ATIVAN) 0.5 MG tablet  Take 0.5 mg by mouth at bedtime. anxiety     .  Multiple Vitamin (MULITIVITAMIN WITH MINERALS) TABS  Take 1 tablet by mouth daily.     Marland Kitchen   oxyCODONE-acetaminophen (PERCOCET/ROXICET) 5-325 MG per tablet  Take 1 tablet by mouth every 6 (six) hours as needed for pain.  30 tablet  0   .  simvastatin (ZOCOR) 40 MG tablet  Take 40 mg by mouth at bedtime.     .  diazepam (VALIUM) 5 MG tablet  Take 5 mg by mouth daily as needed. Takes 15 minutes prior to office visit for HyperChamber therapy      REVIEW OF SYSTEMS: Arly.Keller ] denotes positive finding; [ ]  denotes negative finding  CARDIOVASCULAR: [ ]  chest pain [ ]  chest pressure [ ]  palpitations [ ]  orthopnea  [ ]  dyspnea on exertion Arly.Keller ] claudication Arly.Keller ] rest pain [ ]  DVT [ ]  phlebitis  PULMONARY: [ ]  productive cough [ ]  asthma [ ]  wheezing  NEUROLOGIC: [x ] weakness [ ]  paresthesias [ ]  aphasia [ ]  amaurosis [ ]  dizziness  HEMATOLOGIC: [ ]  bleeding problems [ ]  clotting disorders  MUSCULOSKELETAL: [ ]  joint pain [ ]  joint swelling Arly.Keller ] leg swelling  GASTROINTESTINAL: [ ]  blood in stool [ ]  hematemesis  GENITOURINARY: [ ]  dysuria [ ]  hematuria  PSYCHIATRIC: [ ]  history of major depression  INTEGUMENTARY: [ ]  rashes [ ]  ulcers  CONSTITUTIONAL: [ ]  fever [ ]  chills  PHYSICAL EXAM:  Filed Vitals:    10/29/12 1138   BP:  178/60   Pulse:  82   Temp:  98.1 F (36.7 C)   TempSrc:  Oral   Height:  5\' 3"  (1.6 m)   Weight:  132 lb 1.6 oz (59.92 kg)   SpO2:  100%    Body mass index is 23.40 kg/(m^2).  GENERAL: The patient is a well-nourished female, in no acute distress. The vital signs are documented above.  CARDIOVASCULAR: There is a regular rate and rhythm without significant murmur appreciated. She has bilateral carotid bruits. She has palpable femoral pulses. I cannot palpate popliteal or pedal pulses.  PULMONARY: There is good air exchange bilaterally without wheezing or rales.  ABDOMEN: Soft and non-tender with normal pitched bowel sounds. I do not palpate an abdominal aortic aneurysm.  MUSCULOSKELETAL: she has a left transmetatarsal amputation.  NEUROLOGIC: No focal weakness or  paresthesias are detected.  SKIN: there is an open ulcer on the lateral  aspect of her left transmetatarsal amputation.  PSYCHIATRIC: The patient has a normal affect.  DATA:  Have reviewed his arteriogram. The results are described above.  MEDICAL ISSUES:  This patient presents with a nonhealing wound of the left transmetatarsal amputation site with severe rest pain left foot. I think the only remaining option for revascularization would be to attempt a redo left femoral to below knee pop 2 artery bypass graft with a prosthetic graft. I would not want to take vein from the right leg given her severe infrainguinal arterial occlusive disease on the right.I have reviewed the indications for lower extremity bypass. I have also reviewed the potential complications of surgery including but not limited to: wound healing problems, infection, graft thrombosis, limb loss, or other unpredictable medical problems. All the patient's questions were answered and they are agreeable to proceed. She understands that this is not successful she will require likely a below the knee amputation on the left. Her surgery is scheduled for 11/04/2012.  Plan:  Admit for glucose monitoring and treatment of anemia. Transfuse 1 unit PRBC.  COLLINS, Sherri Brooks   I have examined the patient, reviewed and agree with above. The patient had a hypoglycemic episode today during preadmission testing. She was sent to the emergency department. Blood sugar was found to be 30. She was appropriate treated. She was also found to have significant anemia. She will be admitted for transfusion of one unit packed cells prior to her surgery which will proceed as scheduled since it is urgent tomorrow at 7:30. I discussed this with the patient and family present. Sherri Buffin, MD 11/03/2012 2:59 PM

## 2012-11-03 NOTE — Pre-Procedure Instructions (Signed)
Sherri Brooks  11/03/2012   Your procedure is scheduled on:  Tuesday November 04, 2012  Report to [facility] at 5:30 AM.  Call this number if you have problems the morning of surgery: [phone]   Remember:   Do not eat food or drink liquids after midnight.   Take these medicines the morning of surgery with A SIP OF WATER: albuterol, fosamax, valium (if needed), dilaudid (if needed), labetalol,    Do not wear jewelry, make-up or nail polish.  Do not wear lotions, powders, or perfumes.  Do not shave 48 hours prior to surgery.  Do not bring valuables to the hospital.  Contacts, dentures or bridgework may not be worn into surgery.  Leave suitcase in the car. After surgery it may be brought to your room.  For patients admitted to the hospital, checkout time is 11:00 AM the day of  discharge.   Patients discharged the day of surgery will not be allowed to drive  home.  Name and phone number of your driver: family / friend  Special Instructions: Shower using CHG 2 nights before surgery and the night before surgery.  If you shower the day of surgery use CHG.  Use special wash - you have one bottle of CHG for all showers.  You should use approximately 1/3 of the bottle for each shower.   Please read over the following fact sheets that you were given: Pain Booklet, Coughing and Deep Breathing, Blood Transfusion Information, MRSA Information and Surgical Site Infection Prevention

## 2012-11-03 NOTE — ED Notes (Signed)
Recheck of CBG resulted in 68 . Pt continues to be sleepy and A/O x2. Results reported to Dr Fredderick Phenix  And new orders given.

## 2012-11-03 NOTE — Progress Notes (Signed)
Patient arrived into preadmission area at 11:35am.  Patient lethargic.  Accompanied by family member.  Family member states patient was alert and oriented this morning but became confused and not easy to arouse before coming to hospital for preadmission appointment.  Patient responded when name was called, but went back into lethargic state.  Vital signs BP 165/59, BS 35, Patient transported to Emergency department.  Nonnie Done with Dr. Adele Dan office to notify that patient is in ED.

## 2012-11-03 NOTE — Progress Notes (Signed)
PHARMACIST - PHYSICIAN COMMUNICATION  CONCERNING: P&T Medication Policy Regarding Oral Bisphosphonates  RECOMMENDATION: Your order for alendronate (Fosamax), ibandronate (Boniva), or risedronate (Actonel) has been discontinued at this time.  If the patient's post-hospital medical condition warrants safe use of this class of drugs, please resume the pre-hospital regimen upon discharge.  DESCRIPTION:  Alendronate (Fosamax), ibandronate (Boniva), and risedronate (Actonel) can cause severe esophageal erosions in patients who are unable to remain upright at least 30 minutes after taking this medication.   Since brief interruptions in therapy are thought to have minimal impact on bone mineral density, the Pharmacy & Therapeutics Committee has established that bisphosphonate orders should be routinely discontinued during hospitalization.   To override this safety policy and permit administration of Boniva, Fosamax, or Actonel in the hospital, prescribers must write "DO NOT HOLD" in the comments section when placing the order for this class of medications.  Celedonio Miyamoto, PharmD, BCPS Clinical Pharmacist Pager [phone]

## 2012-11-03 NOTE — ED Notes (Signed)
Pt was upstairs for presurgical workup and blood sugar was 35, sent down to the ED. Lives with daughter and daughter thinks she accidentally took the wrong insulin of friend that lives there as well. Pt is alert and oriented.

## 2012-11-03 NOTE — ED Provider Notes (Signed)
History     CSN: [id number]  Arrival date & time 11/03/12  1154   First MD Initiated Contact with Patient 11/03/12 1307      Chief Complaint  Patient presents with  . Hypoglycemia    (Consider location/radiation/quality/duration/timing/severity/associated sxs/prior treatment) HPI Comments: Patient was brought over from preop area for altered mental status. They found her blood sugar to be 35. She states that she normally takes 15 units of NPH insulin at night and sometimes she just is based on her blood sugar however she did not typically taken insulin in the morning. A friend of hers she was at the house.that she takes in the morning and she did not pay attention but he gave her a shot of 15 units of regular insulin this morning. This happened about 6 AM this morning. Her blood sugar was initially 35 and a subsequent check after she drink some orange juice was 69. After this she was given half an amp of D50 and she states she's feeling much better after this. She denies any recent illnesses. She denies any chest pain or shortness of breath. She denies any abdominal pain. She states that she had one black stool a few days ago but hasn't had any other black stools or blood in her stool. She's scheduled to have surgery tomorrow by Dr. Edilia Bo for arterial disease in her left foot.   Past Medical History  Diagnosis Date  . Diabetes mellitus   . Hypertension   . Hyperlipidemia   . PVD (peripheral vascular disease)   . Cellulitis of buttock, left 2010  . Arthritis   . GERD (gastroesophageal reflux disease)   . CAD (coronary artery disease)   . MI (myocardial infarction)     Past Surgical History  Procedure Date  . Femoral-popliteal bypass graft 07/2010  . Right common iliac stent 10/2009    Dr Durwin Nora ([place])  . Tonsillectomy   . S/p hysterecotmy     partial  . Foot amputation 2010    left  . Colonoscopy 10/26/2011    Procedure: COLONOSCOPY;  Surgeon: Arlyce Harman, MD;  Location: [facility]  ENDO SUITE;  Service: Endoscopy;  Laterality: N/A;  1:30    Family History  Problem Relation Age of Onset  . Brain cancer Father   . Cancer Father   . Diabetes Mother     many family members  . Alzheimer's disease Mother     History  Substance Use Topics  . Smoking status: Current Every Day Smoker -- 0.4 packs/day for 20 years    Types: Cigarettes  . Smokeless tobacco: Never Used     Comment: states she only smokes because of the pain  . Alcohol Use: No    OB History    Grav Para Term Preterm Abortions TAB SAB Ect Mult Living                  Review of Systems  Constitutional: Negative for fever, chills, diaphoresis and fatigue.  HENT: Negative for congestion, rhinorrhea and sneezing.   Eyes: Negative.   Respiratory: Negative for cough, chest tightness and shortness of breath.   Cardiovascular: Negative for chest pain and leg swelling.  Gastrointestinal: Negative for nausea, vomiting, abdominal pain, diarrhea and blood in stool.  Genitourinary: Negative for frequency, hematuria, flank pain and difficulty urinating.  Musculoskeletal: Negative for back pain and arthralgias.  Skin: Negative for rash.  Neurological: Negative for dizziness, speech difficulty, weakness, numbness and headaches.    Allergies  Iohexol  Home Medications   Current Outpatient Rx  Name  Route  Sig  Dispense  Refill  . AMLODIPINE BESYLATE 5 MG PO TABS   Oral   Take 5 mg by mouth daily.         . ALBUTEROL SULFATE HFA 108 (90 BASE) MCG/ACT IN AERS   Inhalation   Inhale 2 puffs into the lungs every 6 (six) hours as needed. For bronchitis and coughing         . ALENDRONATE SODIUM 70 MG PO TABS   Oral   Take 70 mg by mouth every 7 (seven) days. On Mondays         . ASPIRIN 325 MG PO TBEC   Oral   Take 325 mg by mouth daily.           Marland Kitchen CLOPIDOGREL BISULFATE 75 MG PO TABS   Oral   Take 75 mg by mouth daily.           Marland Kitchen DIAZEPAM 5 MG PO TABS   Oral   Take 5 mg by mouth daily  as needed. Takes 15 minutes prior to office visit for HyperChamber therapy         . HYDROCHLOROTHIAZIDE 25 MG PO TABS   Oral   Take 25 mg by mouth daily.         Marland Kitchen HYDROMORPHONE HCL 2 MG PO TABS   Oral   Take 2 mg by mouth every 8 (eight) hours as needed. pain         . INSULIN GLARGINE 100 UNIT/ML [place] SOLN   Subcutaneous   Inject 10 Units into the skin at bedtime.         Marland Kitchen LABETALOL HCL 200 MG PO TABS   Oral   Take 200 mg by mouth 2 (two) times daily.           Marland Kitchen LORAZEPAM 0.5 MG PO TABS   Oral   Take 0.5 mg by mouth at bedtime.          . ADULT MULTIVITAMIN W/MINERALS CH   Oral   Take 1 tablet by mouth daily.         Marland Kitchen SIMVASTATIN 40 MG PO TABS   Oral   Take 40 mg by mouth at bedtime.            BP 152/69  Pulse 83  Resp 18  SpO2 100%  Physical Exam  Constitutional: She is oriented to person, place, and time. She appears well-developed and well-nourished.  HENT:  Head: Normocephalic and atraumatic.  Eyes: Pupils are equal, round, and reactive to light.  Neck: Normal range of motion. Neck supple.  Cardiovascular: Normal rate, regular rhythm and normal heart sounds.   Pulmonary/Chest: Effort normal and breath sounds normal. No respiratory distress. She has no wheezes. She has no rales. She exhibits no tenderness.  Abdominal: Soft. Bowel sounds are normal. There is no tenderness. There is no rebound and no guarding.  Musculoskeletal: Normal range of motion. She exhibits no edema.       Pt with dressing to left foot.  Lymphadenopathy:    She has no cervical adenopathy.  Neurological: She is alert and oriented to person, place, and time.  Skin: Skin is warm and dry. No rash noted.  Psychiatric: She has a normal mood and affect.    ED Course  Procedures (including critical care time)  Results for orders placed during the hospital encounter of 11/03/12  GLUCOSE, CAPILLARY  Component Value Range   Glucose-Capillary 67 (*) 70 - 99 mg/dL    Comment 1 Documented in Chart     Comment 2 Notify RN    BASIC METABOLIC PANEL      Component Value Range   Sodium 139  135 - 145 mEq/L   Potassium 4.2  3.5 - 5.1 mEq/L   Chloride 104  96 - 112 mEq/L   CO2 23  19 - 32 mEq/L   Glucose, Bld 64 (*) 70 - 99 mg/dL   BUN 27 (*) 6 - 23 mg/dL   Creatinine, Ser 6.96  0.50 - 1.10 mg/dL   Calcium 9.3  8.4 - 29.5 mg/dL   GFR calc non Af Amer 56 (*) >90 mL/min   GFR calc Af Amer 64 (*) >90 mL/min  CBC WITH DIFFERENTIAL      Component Value Range   WBC 10.2  4.0 - 10.5 K/uL   RBC 3.01 (*) 3.87 - 5.11 MIL/uL   Hemoglobin 7.4 (*) 12.0 - 15.0 g/dL   HCT 28.4 (*) 13.2 - 44.0 %   MCV 76.1 (*) 78.0 - 100.0 fL   MCH 24.6 (*) 26.0 - 34.0 pg   MCHC 32.3  30.0 - 36.0 g/dL   RDW 10.2 (*) 72.5 - 36.6 %   Platelets 380  150 - 400 K/uL   Neutrophils Relative 73  43 - 77 %   Neutro Abs 7.5  1.7 - 7.7 K/uL   Lymphocytes Relative 17  12 - 46 %   Lymphs Abs 1.8  0.7 - 4.0 K/uL   Monocytes Relative 8  3 - 12 %   Monocytes Absolute 0.8  0.1 - 1.0 K/uL   Eosinophils Relative 1  0 - 5 %   Eosinophils Absolute 0.1  0.0 - 0.7 K/uL   Basophils Relative 0  0 - 1 %   Basophils Absolute 0.0  0.0 - 0.1 K/uL  GLUCOSE, CAPILLARY      Component Value Range   Glucose-Capillary 68 (*) 70 - 99 mg/dL  GLUCOSE, CAPILLARY      Component Value Range   Glucose-Capillary 253 (*) 70 - 99 mg/dL   Comment 1 Notify RN     No results found.  No results found.   1. Hypoglycemia   2. Anemia       MDM  I discussed the patient with a vascular surgeon on call who will admit the pt, monitor blood sugars, give blood in anticipation of surgery tomorrow.        Rolan Bucco, MD 11/03/12 [phone]

## 2012-11-03 NOTE — Progress Notes (Signed)
Utilization Review Completed.Sherri Brooks T1/20/2014   

## 2012-11-03 NOTE — ED Notes (Signed)
Unable to obtain blood from IV start

## 2012-11-04 ENCOUNTER — Encounter (HOSPITAL_COMMUNITY): Payer: Self-pay | Admitting: Anesthesiology

## 2012-11-04 ENCOUNTER — Inpatient Hospital Stay (HOSPITAL_COMMUNITY): Payer: PRIVATE HEALTH INSURANCE | Admitting: Anesthesiology

## 2012-11-04 ENCOUNTER — Encounter (HOSPITAL_COMMUNITY)
Admission: EM | Disposition: A | Discharge status: Home Health | Payer: Self-pay | Source: Home / Self Care | Attending: Vascular Surgery

## 2012-11-04 ENCOUNTER — Inpatient Hospital Stay (HOSPITAL_COMMUNITY)
Admission: RE | Admit: 2012-11-04 | Payer: PRIVATE HEALTH INSURANCE | Source: Ambulatory Visit | Admitting: Vascular Surgery

## 2012-11-04 DIAGNOSIS — E118 Type 2 diabetes mellitus with unspecified complications: Secondary | ICD-10-CM | POA: Diagnosis present

## 2012-11-04 DIAGNOSIS — E119 Type 2 diabetes mellitus without complications: Secondary | ICD-10-CM | POA: Diagnosis present

## 2012-11-04 DIAGNOSIS — L98499 Non-pressure chronic ulcer of skin of other sites with unspecified severity: Secondary | ICD-10-CM

## 2012-11-04 DIAGNOSIS — F17200 Nicotine dependence, unspecified, uncomplicated: Secondary | ICD-10-CM

## 2012-11-04 DIAGNOSIS — S91302A Unspecified open wound, left foot, initial encounter: Secondary | ICD-10-CM

## 2012-11-04 DIAGNOSIS — I70209 Unspecified atherosclerosis of native arteries of extremities, unspecified extremity: Secondary | ICD-10-CM

## 2012-11-04 HISTORY — PX: FEMORAL-POPLITEAL BYPASS GRAFT: SHX937

## 2012-11-04 LAB — CBC
HCT: 26 % — ABNORMAL LOW (ref 36.0–46.0)
Hemoglobin: 8.6 g/dL — ABNORMAL LOW (ref 12.0–15.0)
MCH: 25.6 pg — ABNORMAL LOW (ref 26.0–34.0)
MCHC: 33.1 g/dL (ref 30.0–36.0)
MCHC: 32.9 g/dL (ref 30.0–36.0)
MCV: 77.4 fL — ABNORMAL LOW (ref 78.0–100.0)
Platelets: 338 K/uL (ref 150–400)
RBC: 3.36 MIL/uL — ABNORMAL LOW (ref 3.87–5.11)
RDW: 17 % — ABNORMAL HIGH (ref 11.5–15.5)
RDW: 17.2 % — ABNORMAL HIGH (ref 11.5–15.5)
WBC: 10 K/uL (ref 4.0–10.5)

## 2012-11-04 LAB — CREATININE, SERUM
Creatinine, Ser: 1.07 mg/dL (ref 0.50–1.10)
GFR calc non Af Amer: 52 mL/min — ABNORMAL LOW (ref >90)

## 2012-11-04 LAB — GLUCOSE, CAPILLARY: Glucose-Capillary: 175 mg/dL — ABNORMAL HIGH (ref 70–99)

## 2012-11-04 SURGERY — BYPASS GRAFT FEMORAL-POPLITEAL ARTERY
Anesthesia: General | Site: Leg Upper | Laterality: Left | Wound class: Clean

## 2012-11-04 MED ORDER — PROTAMINE SULFATE 10 MG/ML IV SOLN
INTRAVENOUS | Status: DC | PRN
  Administered 2012-11-04: 30 mg via INTRAVENOUS

## 2012-11-04 MED ORDER — HYDROMORPHONE HCL PF 1 MG/ML IJ SOLN: 0.2500 mg | INTRAMUSCULAR | Status: DC | PRN

## 2012-11-04 MED ORDER — SODIUM CHLORIDE 0.9 % IR SOLN
Status: DC | PRN
  Administered 2012-11-04: 09:00:00

## 2012-11-04 MED ORDER — ONDANSETRON HCL 4 MG/2ML IJ SOLN: 4.0000 mg | Freq: Once | INTRAMUSCULAR | Status: DC | PRN

## 2012-11-04 MED ORDER — LIDOCAINE HCL (CARDIAC) 20 MG/ML IV SOLN
INTRAVENOUS | Status: DC | PRN
  Administered 2012-11-04: 100 mg via INTRAVENOUS

## 2012-11-04 MED ORDER — DEXTROSE 5 % IV SOLN
1.5000 g | Freq: Two times a day (BID) | INTRAVENOUS | Status: AC
  Administered 2012-11-04 – 2012-11-05 (×2): 1.5 g via INTRAVENOUS
  Filled 2012-11-04 (×3): qty 1.5

## 2012-11-04 MED ORDER — HEPARIN SODIUM (PORCINE) 1000 UNIT/ML IJ SOLN
INTRAMUSCULAR | Status: DC | PRN
  Administered 2012-11-04: 5000 [IU] via INTRAVENOUS

## 2012-11-04 MED ORDER — LACTATED RINGERS IV SOLN
INTRAVENOUS | Status: DC | PRN
  Administered 2012-11-04 (×2): via INTRAVENOUS

## 2012-11-04 MED ORDER — OXYCODONE-ACETAMINOPHEN 5-325 MG PO TABS
1.0000 | ORAL_TABLET | ORAL | Status: DC | PRN
  Administered 2012-11-05: 1 via ORAL
  Administered 2012-11-06 (×2): 2 via ORAL
  Administered 2012-11-06: 1 via ORAL
  Administered 2012-11-07: 2 via ORAL
  Filled 2012-11-04: qty 1
  Filled 2012-11-04 (×3): qty 2
  Filled 2012-11-04: qty 1

## 2012-11-04 MED ORDER — ATORVASTATIN CALCIUM 20 MG PO TABS
20.0000 mg | ORAL_TABLET | Freq: Every day | ORAL | Status: DC
  Administered 2012-11-04 – 2012-11-06 (×3): 20 mg via ORAL
  Filled 2012-11-04 (×4): qty 1

## 2012-11-04 MED ORDER — EPHEDRINE SULFATE 50 MG/ML IJ SOLN
INTRAMUSCULAR | Status: DC | PRN
  Administered 2012-11-04 (×3): 5 mg via INTRAVENOUS

## 2012-11-04 MED ORDER — MAGNESIUM SULFATE 40 MG/ML IJ SOLN
2.0000 g | Freq: Once | INTRAMUSCULAR | Status: AC | PRN
  Filled 2012-11-04: qty 50

## 2012-11-04 MED ORDER — POTASSIUM CHLORIDE CRYS ER 20 MEQ PO TBCR: 20.0000 meq | EXTENDED_RELEASE_TABLET | Freq: Once | ORAL | Status: AC | PRN

## 2012-11-04 MED ORDER — ONDANSETRON HCL 4 MG/2ML IJ SOLN: 4.0000 mg | Freq: Four times a day (QID) | INTRAMUSCULAR | Status: DC | PRN

## 2012-11-04 MED ORDER — ENOXAPARIN SODIUM 30 MG/0.3ML ~~LOC~~ SOLN
40.0000 mg | SUBCUTANEOUS | Status: DC
  Administered 2012-11-05: 40 mg via SUBCUTANEOUS
  Filled 2012-11-04 (×3): qty 0.4

## 2012-11-04 MED ORDER — DOCUSATE SODIUM 100 MG PO CAPS
100.0000 mg | ORAL_CAPSULE | Freq: Every day | ORAL | Status: DC
  Administered 2012-11-06 – 2012-11-07 (×2): 100 mg via ORAL
  Filled 2012-11-04 (×2): qty 1

## 2012-11-04 MED ORDER — ALUM & MAG HYDROXIDE-SIMETH 200-200-20 MG/5ML PO SUSP: 15.0000 mL | ORAL | Status: DC | PRN

## 2012-11-04 MED ORDER — THROMBIN 20000 UNITS EX SOLR
CUTANEOUS | Status: AC
  Filled 2012-11-04: qty 20000

## 2012-11-04 MED ORDER — PANTOPRAZOLE SODIUM 40 MG PO TBEC
40.0000 mg | DELAYED_RELEASE_TABLET | Freq: Every day | ORAL | Status: DC
  Administered 2012-11-05 – 2012-11-07 (×3): 40 mg via ORAL
  Filled 2012-11-04 (×4): qty 1

## 2012-11-04 MED ORDER — DOPAMINE-DEXTROSE 3.2-5 MG/ML-% IV SOLN: 3.0000 ug/kg/min | INTRAVENOUS | Status: DC

## 2012-11-04 MED ORDER — ACETAMINOPHEN 325 MG PO TABS: 325.0000 mg | ORAL_TABLET | ORAL | Status: DC | PRN

## 2012-11-04 MED ORDER — ACETAMINOPHEN 650 MG RE SUPP: 325.0000 mg | RECTAL | Status: DC | PRN

## 2012-11-04 MED ORDER — AMLODIPINE BESYLATE 5 MG PO TABS
5.0000 mg | ORAL_TABLET | Freq: Every day | ORAL | Status: DC
  Administered 2012-11-05 – 2012-11-07 (×3): 5 mg via ORAL
  Filled 2012-11-04 (×4): qty 1

## 2012-11-04 MED ORDER — POTASSIUM CHLORIDE IN NACL 20-0.9 MEQ/L-% IV SOLN
INTRAVENOUS | Status: DC
  Administered 2012-11-04 – 2012-11-05 (×2): via INTRAVENOUS
  Filled 2012-11-04 (×3): qty 1000

## 2012-11-04 MED ORDER — PROPOFOL 10 MG/ML IV BOLUS
INTRAVENOUS | Status: DC | PRN
  Administered 2012-11-04: 150 mg via INTRAVENOUS

## 2012-11-04 MED ORDER — PHENOL 1.4 % MT LIQD: 1.0000 | OROMUCOSAL | Status: DC | PRN

## 2012-11-04 MED ORDER — 0.9 % SODIUM CHLORIDE (POUR BTL) OPTIME
TOPICAL | Status: DC | PRN
  Administered 2012-11-04: 1000 mL

## 2012-11-04 MED ORDER — FENTANYL CITRATE 0.05 MG/ML IJ SOLN
INTRAMUSCULAR | Status: DC | PRN
  Administered 2012-11-04: 100 ug via INTRAVENOUS

## 2012-11-04 MED ORDER — METOPROLOL TARTRATE 1 MG/ML IV SOLN: 2.0000 mg | INTRAVENOUS | Status: DC | PRN

## 2012-11-04 MED ORDER — GUAIFENESIN-DM 100-10 MG/5ML PO SYRP: 15.0000 mL | ORAL_SOLUTION | ORAL | Status: DC | PRN

## 2012-11-04 MED ORDER — HYDRALAZINE HCL 20 MG/ML IJ SOLN
10.0000 mg | INTRAMUSCULAR | Status: DC | PRN
  Filled 2012-11-04: qty 0.5

## 2012-11-04 MED ORDER — LABETALOL HCL 5 MG/ML IV SOLN
10.0000 mg | INTRAVENOUS | Status: DC | PRN
  Filled 2012-11-04: qty 4

## 2012-11-04 MED ORDER — MORPHINE SULFATE 2 MG/ML IJ SOLN
2.0000 mg | INTRAMUSCULAR | Status: DC | PRN
  Administered 2012-11-04 – 2012-11-05 (×3): 2 mg via INTRAVENOUS
  Filled 2012-11-04: qty 2
  Filled 2012-11-04: qty 1

## 2012-11-04 MED ORDER — SODIUM CHLORIDE 0.9 % IV SOLN: 500.0000 mL | Freq: Once | INTRAVENOUS | Status: AC | PRN

## 2012-11-04 MED ORDER — ROCURONIUM BROMIDE 100 MG/10ML IV SOLN
INTRAVENOUS | Status: DC | PRN
  Administered 2012-11-04: 25 mg via INTRAVENOUS

## 2012-11-04 MED ORDER — INSULIN ASPART 100 UNIT/ML ~~LOC~~ SOLN
0.0000 [IU] | Freq: Three times a day (TID) | SUBCUTANEOUS | Status: DC
  Administered 2012-11-05: 2 [IU] via SUBCUTANEOUS
  Administered 2012-11-05 – 2012-11-06 (×2): 5 [IU] via SUBCUTANEOUS
  Administered 2012-11-06: 2 [IU] via SUBCUTANEOUS
  Administered 2012-11-06: 3 [IU] via SUBCUTANEOUS
  Administered 2012-11-07: 8 [IU] via SUBCUTANEOUS

## 2012-11-04 SURGICAL SUPPLY — 66 items
BANDAGE ELASTIC 4 VELCRO ST LF (GAUZE/BANDAGES/DRESSINGS) IMPLANT
BANDAGE ESMARK 6X9 LF (GAUZE/BANDAGES/DRESSINGS) ×1 IMPLANT
BNDG ESMARK 6X9 LF (GAUZE/BANDAGES/DRESSINGS) ×2
CANISTER SUCTION 2500CC (MISCELLANEOUS) ×2 IMPLANT
CANNULA VESSEL W/WING WO/VALVE (CANNULA) ×2 IMPLANT
CLIP TI MEDIUM 24 (CLIP) ×2 IMPLANT
CLIP TI WIDE RED SMALL 24 (CLIP) ×2 IMPLANT
CLOTH BEACON ORANGE TIMEOUT ST (SAFETY) ×2 IMPLANT
COVER SURGICAL LIGHT HANDLE (MISCELLANEOUS) ×2 IMPLANT
CUFF TOURNIQUET SINGLE 24IN (TOURNIQUET CUFF) ×2 IMPLANT
CUFF TOURNIQUET SINGLE 34IN LL (TOURNIQUET CUFF) IMPLANT
CUFF TOURNIQUET SINGLE 44IN (TOURNIQUET CUFF) IMPLANT
DERMABOND ADVANCED (GAUZE/BANDAGES/DRESSINGS) ×2
DERMABOND ADVANCED .7 DNX12 (GAUZE/BANDAGES/DRESSINGS) ×2 IMPLANT
DRAIN CHANNEL 15F RND FF W/TCR (WOUND CARE) IMPLANT
DRAIN PENROSE 3/4X12 (DRAIN) IMPLANT
DRAPE WARM FLUID 44X44 (DRAPE) ×2 IMPLANT
DRAPE X-RAY CASS 24X20 (DRAPES) IMPLANT
DRSG COVADERM 4X10 (GAUZE/BANDAGES/DRESSINGS) IMPLANT
DRSG COVADERM 4X8 (GAUZE/BANDAGES/DRESSINGS) IMPLANT
ELECT REM PT RETURN 9FT ADLT (ELECTROSURGICAL) ×2
ELECTRODE REM PT RTRN 9FT ADLT (ELECTROSURGICAL) ×1 IMPLANT
EVACUATOR SILICONE 100CC (DRAIN) IMPLANT
GAUZE SPONGE 4X4 16PLY XRAY LF (GAUZE/BANDAGES/DRESSINGS) ×2 IMPLANT
GLOVE BIO SURGEON STRL SZ7.5 (GLOVE) ×2 IMPLANT
GLOVE BIOGEL PI IND STRL 6.5 (GLOVE) ×4 IMPLANT
GLOVE BIOGEL PI IND STRL 7.0 (GLOVE) ×1 IMPLANT
GLOVE BIOGEL PI IND STRL 7.5 (GLOVE) ×2 IMPLANT
GLOVE BIOGEL PI IND STRL 8 (GLOVE) ×1 IMPLANT
GLOVE BIOGEL PI INDICATOR 6.5 (GLOVE) ×4
GLOVE BIOGEL PI INDICATOR 7.0 (GLOVE) ×1
GLOVE BIOGEL PI INDICATOR 7.5 (GLOVE) ×2
GLOVE BIOGEL PI INDICATOR 8 (GLOVE) ×1
GLOVE SURG SS PI 6.5 STRL IVOR (GLOVE) ×4 IMPLANT
GOWN STRL NON-REIN LRG LVL3 (GOWN DISPOSABLE) ×4 IMPLANT
GRAFT PROPATEN THIN WALL 6X80 (Vascular Products) ×2 IMPLANT
INSERT FOGARTY SM (MISCELLANEOUS) ×2 IMPLANT
KIT BASIN OR (CUSTOM PROCEDURE TRAY) ×2 IMPLANT
KIT ROOM TURNOVER OR (KITS) ×2 IMPLANT
MARKER GRAFT CORONARY BYPASS (MISCELLANEOUS) IMPLANT
NS IRRIG 1000ML POUR BTL (IV SOLUTION) ×4 IMPLANT
PACK PERIPHERAL VASCULAR (CUSTOM PROCEDURE TRAY) ×2 IMPLANT
PAD ARMBOARD 7.5X6 YLW CONV (MISCELLANEOUS) ×4 IMPLANT
PADDING CAST COTTON 6X4 STRL (CAST SUPPLIES) IMPLANT
SET COLLECT BLD 21X3/4 12 (NEEDLE) IMPLANT
SPONGE SURGIFOAM ABS GEL 100 (HEMOSTASIS) IMPLANT
STAPLER VISISTAT 35W (STAPLE) IMPLANT
STOPCOCK 4 WAY LG BORE MALE ST (IV SETS) IMPLANT
SUT ETHILON 3 0 PS 1 (SUTURE) IMPLANT
SUT GORETEX 6.0 TT9 (SUTURE) ×4 IMPLANT
SUT PROLENE 5 0 C 1 24 (SUTURE) ×2 IMPLANT
SUT PROLENE 6 0 BV (SUTURE) ×2 IMPLANT
SUT PROLENE 7 0 BV 1 (SUTURE) IMPLANT
SUT SILK 2 0 FS (SUTURE) ×2 IMPLANT
SUT SILK 3 0 (SUTURE) ×1
SUT SILK 3-0 18XBRD TIE 12 (SUTURE) ×1 IMPLANT
SUT VIC AB 2-0 CTB1 (SUTURE) ×4 IMPLANT
SUT VIC AB 3-0 SH 27 (SUTURE) ×2
SUT VIC AB 3-0 SH 27X BRD (SUTURE) ×2 IMPLANT
SUT VICRYL 4-0 PS2 18IN ABS (SUTURE) ×4 IMPLANT
TOWEL OR 17X24 6PK STRL BLUE (TOWEL DISPOSABLE) ×4 IMPLANT
TOWEL OR 17X26 10 PK STRL BLUE (TOWEL DISPOSABLE) ×6 IMPLANT
TRAY FOLEY CATH 14FRSI W/METER (CATHETERS) ×2 IMPLANT
TUBING EXTENTION W/L.L. (IV SETS) IMPLANT
UNDERPAD 30X30 INCONTINENT (UNDERPADS AND DIAPERS) ×2 IMPLANT
WATER STERILE IRR 1000ML POUR (IV SOLUTION) ×2 IMPLANT

## 2012-11-04 NOTE — Op Note (Signed)
NAME: Sherri Brooks   MRN: [id number] DOB: March 31, 1946    DATE OF OPERATION: 11/04/2012  PREOP DIAGNOSIS: nonhealing left transmetatarsal amputation with severe infrainguinal arterial occlusive disease  POSTOP DIAGNOSIS: same  PROCEDURE: redo left femoral to below knee popliteal artery bypass graft with 6 mm PTFE graft  SURGEON: Di Kindle. Edilia Bo, MD, FACS  ASSIST: Della Goo PA  ANESTHESIA: Gen.   EBL: minimal  INDICATIONS: Sherri Brooks is a 67 y.o. female who had undergone a previous left femoral to below knee popliteal artery bypass graft with a PTFE graft in 2011 and also transmetatarsal amputation for a gangrenous right foot wound. Her saphenous vein was explored at that time he was not adequate to be used for a bypass conduit. She presented with a nonhealing wound of the left foot. The femoropopliteal bypass graft is chronically occluded. It was felt that the only remaining option for attempted limb salvage was redo femoropopliteal bypass grafting.  FINDINGS:  Inflow was adequate. The below-knee popliteal artery was patent. The anastomosis was performed below the previous anastomosis. There is single vessel runoff via the peroneal artery. Intraoperative arteriogram was not done because of her dye allergy.  TECHNIQUE: The patient was taken to the operating room and received a general anesthetic. Left lower extremity was prepped and draped in usual sterile fashion. The previous incision in the left groin was opened and through scar tissue the common femoral artery, superficial femoral artery, deep femoral artery, and proximal bypass graft were dissected free. The artery had a reasonable pulse. She had undergone previous external iliac artery angioplasty and stenting. Separate incision was made in the medial aspect of the left leg below the knee and through this incision the below knee popliteal artery was exposed below the previous bypass graft. The dissection was carried down onto  the tibial peroneal trunk. A tunnel was created between the 2 incisions and a 6 mm PTFE graft was tunneled between the 2 incisions. The patient was then heparinized. Attention was first turned to the proximal anastomosis. The common femoral, superficial femoral, and deep femoral arteries were controlled. The hood of the old graft was taken off of the common femoral artery. The proximal graft was spatulated and sewn end-to-side to the common femoral artery using continuous CV 6 PTFE suture. Prior to completing this anastomosis the artery was backbled and flushed and anastomosis completed. The graft was pulled to the appropriate length for anastomosis to the below knee popliteal artery. Tourniquet had been placed on the thigh. The leg was exsanguinated with an Esmarch bandage and tourniquet inflated to 300 mm mercury. Under tourniquet control, a longitudinal arteriotomy was made in the popliteal artery below the previous anastomosis and extended slightly onto the tibial peroneal trunk. The graft was cut to the appropriate length, spatulated, and sewn end to side to the below knee popliteal artery using a continuous CV 6 PTFE suture. Prior to completing this anastomosis the arteries were backbled and flushed properly and the anastomosis completed. Flow was reestablished to the left leg. At this point there was a good peroneal and posterior tibial signal with the Doppler. I elected not to do a intraoperative arteriogram because of her contrast allergy and she had not been premedicated. Hemostasis was obtained in the wound. The groin incision was closed with deep layer of 2-0 Vicryl. Subcutaneous layer 2-0 Vicryl and the skin closed with a 4-0 subcuticular stitch. The below the knee incision was closed with a deep layer 2-0 Vicryl. Subcuticular 3-0 Vicryl and  the skin closed with 4-0 subcuticular stitch. Dermabond was applied. The patient tolerated the procedure well and was transferred to the recovery room in stable  condition. All needle and sponge counts were correct.  Waverly Ferrari, MD, FACS [facility]  DATE OF DICTATION:   11/04/2012

## 2012-11-04 NOTE — Progress Notes (Signed)
VASCULAR PROGRESS NOTE  SUBJECTIVE: Pain well controlled.  PHYSICAL EXAM: Filed Vitals:   11/04/12 1135 11/04/12 1145 11/04/12 1150 11/04/12 1225  BP: 142/52  156/54 169/67  Pulse: 73 74 74 84  Temp:  97.7 F (36.5 C) 97.4 F (36.3 C)   TempSrc:   Rectal   Resp: 14 14 14 12   Weight:      SpO2: 100% 100% 100% 100%   Left foot warm.  Incisions look fine.   LABS: Lab Results  Component Value Date   WBC 10.0 11/04/2012   HGB 8.6* 11/04/2012   HCT 26.0* 11/04/2012   MCV 77.4* 11/04/2012   PLT 338 11/04/2012   Lab Results  Component Value Date   CREATININE 1.02 11/03/2012   Lab Results  Component Value Date   INR 1.00 11/03/2012   CBG (last 3)   Basename 11/04/12 1111 11/04/12 0612 11/03/12 2231  GLUCAP 175* 177* 127*   ASSESSMENT/PLAN: Doing well post op. Dressing change orders written.  Cari Caraway Beeper: [phone] 11/04/2012

## 2012-11-04 NOTE — Progress Notes (Signed)
Report given to kay rn as caregiver 

## 2012-11-04 NOTE — Anesthesia Preprocedure Evaluation (Addendum)
Anesthesia Evaluation  Patient identified by MRN, date of birth, ID band Patient awake    Reviewed: Allergy & Precautions, H&P , NPO status , Patient's Chart, lab work & pertinent test results  Airway Mallampati: I TM Distance: >3 FB Neck ROM: full    Dental  (+) Edentulous Upper, Upper Dentures and Dental Advisory Given Upper dentures placed in denture cup with labels on lid and cup.:   Pulmonary          Cardiovascular hypertension, + CAD, + Past MI and + Peripheral Vascular Disease Rhythm:regular Rate:Normal     Neuro/Psych    GI/Hepatic GERD-  ,  Endo/Other  diabetes, Type 2, Insulin Dependent  Renal/GU      Musculoskeletal   Abdominal   Peds  Hematology   Anesthesia Other Findings   Reproductive/Obstetrics                          Anesthesia Physical Anesthesia Plan  ASA: III  Anesthesia Plan: General   Post-op Pain Management:    Induction: Intravenous  Airway Management Planned: Oral ETT  Additional Equipment:   Intra-op Plan:   Post-operative Plan: Extubation in OR  Informed Consent: I have reviewed the patients History and Physical, chart, labs and discussed the procedure including the risks, benefits and alternatives for the proposed anesthesia with the patient or authorized representative who has indicated his/her understanding and acceptance.     Plan Discussed with: CRNA, Anesthesiologist and Surgeon  Anesthesia Plan Comments:         Anesthesia Quick Evaluation

## 2012-11-04 NOTE — H&P (View-Only) (Signed)
[facility] [place]  Patient name: Sherri Brooks MRN: [id number] DOB: 11/29/1945 Sex: female  CC: nonhealing left transmetatarsal amputation and rest pain of left foot.  HPI: Sherri Brooks is a 66 y.o. female who had originally presented with an extensive infection of her left foot in 2011. She underwent left femoral to below knee popliteal artery bypass grafting with a 6 mm PTFE graft and left transmetatarsal amputation. I had explored her greater saphenous vein on the left and this was not adequate to use as a bypass conduit. She presented with a nonhealing wound on the lateral aspect of her left transmetatarsal amputation site. Her graft was found to be silently occluded on the left. She underwent an arteriogram on 07/28/2012. She had a very tight stenosis of the left external iliac artery which was successfully ballooned and stented. She had diffuse infrainguinal arterial occlusive disease. Her superficial femoral artery was occluded with reconstitution of the below-knee pop artery and the dominant runoff been peroneal artery on the left. She also has significant infrainguinal arterial occlusive disease on the right  She has had progressive rest pain of her left transmetatarsal amputation site especially along the lateral aspect near the left ankle. He does continue to smoke but is cut back to 3-4 cigarettes a day. She was seen in the office today complaining of progressive pain in the left foot and wishes to consider redo revascularization  Past Medical History  Diagnosis Date  . Diabetes mellitus   . Hypertension   . Hyperlipidemia   . PVD (peripheral vascular disease)   . Cellulitis of buttock, left 2010  . Arthritis   . GERD (gastroesophageal reflux disease)   . CAD (coronary artery disease)   . MI (myocardial infarction)     Family History  Problem Relation Age of Onset  . Brain cancer Father   . Cancer Father   . Diabetes Mother     many family members    . Alzheimer's disease Mother     SOCIAL HISTORY: History  Substance Use Topics  . Smoking status: Current Every Day Smoker -- 0.4 packs/day for 20 years    Types: Cigarettes  . Smokeless tobacco: Never Used     Comment: states she only smokes because of the pain  . Alcohol Use: No    Allergies  Allergen Reactions  . Iohexol      Code: HIVES, Desc: PT STATES SHE EXPERIENCED ITCHING W/ IV DYE IN PAST-ARS 01/21/10, Onset Date: [id number]     Current Outpatient Prescriptions  Medication Sig Dispense Refill  . albuterol (PROVENTIL HFA;VENTOLIN HFA) 108 (90 BASE) MCG/ACT inhaler Inhale 2 puffs into the lungs every 6 (six) hours as needed. For bronchitis and coughing      . alendronate (FOSAMAX) 70 MG tablet Take 70 mg by mouth every 7 (seven) days. On Mondays      . aspirin 325 MG EC tablet Take 325 mg by mouth daily.        . clopidogrel (PLAVIX) 75 MG tablet Take 75 mg by mouth daily.        . hydrochlorothiazide (HYDRODIURIL) 25 MG tablet Take 25 mg by mouth daily.      . HYDROmorphone (DILAUDID) 2 MG tablet Take 2 mg by mouth every 8 (eight) hours as needed. pain      . ibuprofen (ADVIL,MOTRIN) 200 MG tablet Take 200 mg by mouth every 6 (six) hours as needed. For pain      . insulin glargine (  LANTUS) 100 UNIT/ML injection Inject 10 Units into the skin at bedtime.      . labetalol (NORMODYNE) 200 MG tablet Take 200 mg by mouth 2 (two) times daily.        . LORazepam (ATIVAN) 0.5 MG tablet Take 0.5 mg by mouth at bedtime. anxiety      . Multiple Vitamin (MULITIVITAMIN WITH MINERALS) TABS Take 1 tablet by mouth daily.      . oxyCODONE-acetaminophen (PERCOCET/ROXICET) 5-325 MG per tablet Take 1 tablet by mouth every 6 (six) hours as needed for pain.  30 tablet  0  . simvastatin (ZOCOR) 40 MG tablet Take 40 mg by mouth at bedtime.       . diazepam (VALIUM) 5 MG tablet Take 5 mg by mouth daily as needed. Takes 15 minutes prior to office visit for HyperChamber therapy        REVIEW OF  SYSTEMS: [X ] denotes positive finding; [  ] denotes negative finding CARDIOVASCULAR:  [ ] chest pain   [ ] chest pressure   [ ] palpitations   [ ] orthopnea   [ ] dyspnea on exertion   [X ] claudication   [X ] rest pain   [ ] DVT   [ ] phlebitis PULMONARY:   [ ] productive cough   [ ] asthma   [ ] wheezing NEUROLOGIC:   [ ] weakness  [ ] paresthesias  [ ] aphasia  [ ] amaurosis  [ ] dizziness HEMATOLOGIC:   [ ] bleeding problems   [ ] clotting disorders MUSCULOSKELETAL:  [ ] joint pain   [ ] joint swelling [X ] leg swelling GASTROINTESTINAL: [ ]  blood in stool  [ ]  hematemesis GENITOURINARY:  [ ]  dysuria  [ ]  hematuria PSYCHIATRIC:  [ ] history of major depression INTEGUMENTARY:  [ ] rashes  [ ] ulcers CONSTITUTIONAL:  [ ] fever   [ ] chills  PHYSICAL EXAM: Filed Vitals:   10/29/12 1138  BP: 178/60  Pulse: 82  Temp: 98.1 F (36.7 C)  TempSrc: Oral  Height: 5' 3" (1.6 m)  Weight: 132 lb 1.6 oz (59.92 kg)  SpO2: 100%   Body mass index is 23.40 kg/(m^2). GENERAL: The patient is a well-nourished female, in no acute distress. The vital signs are documented above. CARDIOVASCULAR: There is a regular rate and rhythm without significant murmur appreciated. She has bilateral carotid bruits. She has palpable femoral pulses. I cannot palpate popliteal or pedal pulses.  PULMONARY: There is good air exchange bilaterally without wheezing or rales. ABDOMEN: Soft and non-tender with normal pitched bowel sounds. I do not palpate an abdominal aortic aneurysm. MUSCULOSKELETAL: she has a left transmetatarsal amputation. NEUROLOGIC: No focal weakness or paresthesias are detected. SKIN: there is an open ulcer on the lateral aspect of her left transmetatarsal amputation. PSYCHIATRIC: The patient has a normal affect.  DATA:  Have reviewed his arteriogram. The results are described above.  MEDICAL ISSUES: This patient presents with a nonhealing wound of the left transmetatarsal amputation site with  severe rest pain left foot. I think the only remaining option for revascularization would be to attempt a redo left femoral to below knee pop 2 artery bypass graft with a prosthetic graft. I would not want to take vein from the right leg given her severe infrainguinal arterial occlusive disease on the right.I have reviewed the indications for lower extremity bypass. I have also reviewed the potential complications of surgery including but not   limited to: wound healing problems, infection, graft thrombosis, limb loss, or other unpredictable medical problems. All the patient's questions were answered and they are agreeable to proceed. She understands that this is not successful she will require likely a below the knee amputation on the left. Her surgery is scheduled for 11/04/2012.  DICKSON,CHRISTOPHER S [facility] Office: [phone]   

## 2012-11-04 NOTE — Progress Notes (Signed)
Pt admitted from PACU, oriented to unit and routines, pain management and safety discussed with pt and family, all verbalize understanding

## 2012-11-04 NOTE — Anesthesia Postprocedure Evaluation (Signed)
  Anesthesia Post-op Note  Patient: Sherri Brooks  Procedure(s) Performed: Procedure(s) (LRB) with comments: BYPASS GRAFT FEMORAL-POPLITEAL ARTERY (Left) - Redo Left Femoral Popliteal Bypass with PTFE  Patient Location: PACU  Anesthesia Type:General  Level of Consciousness: awake, oriented, sedated and patient cooperative  Airway and Oxygen Therapy: Patient Spontanous Breathing  Post-op Pain: mild  Post-op Assessment: Post-op Vital signs reviewed, Patient's Cardiovascular Status Stable, Respiratory Function Stable, Patent Airway, No signs of Nausea or vomiting and Pain level controlled  Post-op Vital Signs: stable  Complications: No apparent anesthesia complications

## 2012-11-04 NOTE — Transfer of Care (Signed)
Immediate Anesthesia Transfer of Care Note  Patient: Sherri Brooks  Procedure(s) Performed: Procedure(s) (LRB) with comments: BYPASS GRAFT FEMORAL-POPLITEAL ARTERY (Left) - Redo Left Femoral Popliteal Bypass with PTFE  Patient Location: PACU  Anesthesia Type:General  Level of Consciousness: awake, alert  and oriented  Airway & Oxygen Therapy: Patient Spontanous Breathing and Patient connected to nasal cannula oxygen  Post-op Assessment: Report given to PACU RN, Post -op Vital signs reviewed and stable and Patient moving all extremities  Post vital signs: Reviewed and stable  Complications: No apparent anesthesia complications

## 2012-11-04 NOTE — Interval H&P Note (Signed)
History and Physical Interval Note:  11/04/2012 7:08 AM  Sherri Brooks  has presented today for surgery, with the diagnosis of Peripheral Vascular Disease  The various methods of treatment have been discussed with the patient and family. After consideration of risks, benefits and other options for treatment, the patient has consented to  Procedure(s) (LRB) with comments: BYPASS GRAFT FEMORAL-POPLITEAL ARTERY (Left) - Redo Left Femoral Popliteal Bypass with PTFE as a surgical intervention .  The patient's history has been reviewed, patient examined, no change in status, stable for surgery.  I have reviewed the patient's chart and labs.  Questions were answered to the patient's satisfaction.     DICKSON,CHRISTOPHER S

## 2012-11-05 ENCOUNTER — Ambulatory Visit: Payer: PRIVATE HEALTH INSURANCE | Admitting: Vascular Surgery

## 2012-11-05 DIAGNOSIS — Z48812 Encounter for surgical aftercare following surgery on the circulatory system: Secondary | ICD-10-CM

## 2012-11-05 LAB — BASIC METABOLIC PANEL
BUN: 17 mg/dL (ref 6–23)
Chloride: 104 mEq/L (ref 96–112)
GFR calc Af Amer: 67 mL/min — ABNORMAL LOW (ref >90)
Potassium: 4 mEq/L (ref 3.5–5.1)
Sodium: 136 mEq/L (ref 135–145)

## 2012-11-05 LAB — GLUCOSE, CAPILLARY
Glucose-Capillary: 176 mg/dL — ABNORMAL HIGH (ref 70–99)
Glucose-Capillary: 121 mg/dL — ABNORMAL HIGH (ref 70–99)
Glucose-Capillary: 152 mg/dL — ABNORMAL HIGH (ref 70–99)

## 2012-11-05 LAB — CBC
HCT: 22.9 % — ABNORMAL LOW (ref 36.0–46.0)
Hemoglobin: 7.5 g/dL — ABNORMAL LOW (ref 12.0–15.0)
RDW: 18 % — ABNORMAL HIGH (ref 11.5–15.5)
WBC: 13.8 10*3/uL — ABNORMAL HIGH (ref 4.0–10.5)

## 2012-11-05 MED ORDER — CEPHALEXIN 500 MG PO CAPS
500.0000 mg | ORAL_CAPSULE | Freq: Three times a day (TID) | ORAL | Status: DC
  Administered 2012-11-05 – 2012-11-07 (×7): 500 mg via ORAL
  Filled 2012-11-05 (×9): qty 1

## 2012-11-05 NOTE — Progress Notes (Addendum)
VASCULAR PROGRESS NOTE  1 Day Post-Op s/p: Redo Left fem BK pop bypass with PTFE.  SUBJECTIVE: No complaints.  PHYSICAL EXAM: Filed Vitals:   11/05/12 0000 11/05/12 0400 11/05/12 0659 11/05/12 0713  BP: 159/60 125/45 150/55   Pulse: 97 87 88   Temp: 99.6 F (37.6 C) 99.8 F (37.7 C)  98.4 F (36.9 C)  TempSrc: Oral Oral  Oral  Resp: 23 18 19    Height:      Weight:      SpO2: 97% 98% 98%    Incisions look fine. Brisk doppler flow in DP, PT, and Per positions.  LABS: Lab Results  Component Value Date   WBC 13.8* 11/05/2012   HGB 7.5* 11/05/2012   HCT 22.9* 11/05/2012   MCV 77.4* 11/05/2012   PLT 331 11/05/2012   Lab Results  Component Value Date   CREATININE 0.99 11/05/2012   Lab Results  Component Value Date   INR 1.00 11/03/2012   CBG (last 3)   Basename 11/04/12 1550 11/04/12 1111 11/04/12 0612  GLUCAP 160* 175* 177*    Principal Problem:  *Atherosclerotic PVD with ulceration Active Problems:  Type II or unspecified type diabetes mellitus with peripheral circulatory disorders, uncontrolled(250.72)  Tobacco use disorder  Open wound of left foot  A/P 1. Now 1 Day Post-Op s/p: Redo Left fem BK pop bypass with PTFE. Bypass graft patent. Dressing changes daily with hydrogel. Her  Daughter can do the dressing changes when she is discharged. 2. DM management: CBG's well controlled on her home meds 3. Tobacco use: Tobacco cessation consulted. She plans on quitting now with family's help. 4. Transfer to 2000. 5. Anticipate d/c tomorrow on keflex with daughter doing the dressing changes 6. Keflex added because of infection in open wound on left foot. WBC = 13.8. Given the prosthetic graft, I feel that the patient should be on antibiotics until the wound is improving. Culture pending.   Cari Caraway Beeper [phone] 11/05/2012

## 2012-11-05 NOTE — Progress Notes (Signed)
VASCULAR LAB PRELIMINARY  ARTERIAL  ABI completed:    RIGHT    LEFT    PRESSURE WAVEFORM  PRESSURE WAVEFORM  BRACHIAL 154 Triphasic BRACHIAL 147 Triphasic  DP 106 Monophasic DP 120 Monophasic  AT   AT    PT 110 Triphasic PT 113 Monophasic  PER   PER    GREAT TOE  NA GREAT TOE  NA    RIGHT LEFT  ABI 0.71 0.78   Right ABI is suggestive moderate arterial insufficiency, left ABI is suggestive of mild arterial insufficiency. Patient complains of pain in left leg. Left leg is swollen, red, and warm to touch. Observations discussed with Roanna Raider, RN.  11/05/2012 1:39 PM Gertie Fey, RDMS, RDCS

## 2012-11-05 NOTE — Progress Notes (Signed)
Report called to [facility] RN on 2000, pt txing in w/c, daughter aware of new room #

## 2012-11-05 NOTE — Evaluation (Signed)
Physical Therapy Evaluation Patient Details Name: Sherri Brooks MRN: [id number] DOB: 03-08-1946 Today's Date: 11/05/2012 Time: 7829-5621 PT Time Calculation (min): 31 min  PT Assessment / Plan / Recommendation Clinical Impression  Patient is a 67 y.o. female s/p left BK pop bypass revision who presents with decreased safety with bed to chair mobility (which may be all she does at home.)  Also potentially medication related cognitive changes affect safety and independence.  She will benefit from skilled PT in the acute settting to maximize independence as patient reports home alone while daughter at work.    PT Assessment  Patient needs continued PT services    Follow Up Recommendations  Home health PT;Supervision/Assistance - 24 hour       Barriers to Discharge  Decreased caregiver support      Equipment Recommendations  None recommended by PT       Frequency Min 3X/week    Precautions / Restrictions Precautions Precautions: Fall Precaution Comments: left transmet amp   Pertinent Vitals/Pain 5/10 left leg      Mobility  Bed Mobility Bed Mobility: Supine to Sit;Sit to Supine;Scooting to HOB Supine to Sit: 5: Supervision Sit to Supine: 4: Min assist Scooting to [facility]: 4: Min assist Details for Bed Mobility Assistance: cues for scooting up and assist for getting linens straightened out Transfers Transfers: Sit to [employer] Transfers Squat Pivot Transfers: 4: Min assist;With armrests Details for Transfer Assistance: cues and assist needed for safety due to unsafe technique with reaching forward with both hands to armrests on BSC (bed>chair>BSC>chair>bed); minimal weight on left foot Ambulation/Gait Ambulation/Gait Assistance: Not tested (comment)         Exercises General Exercises - Lower Extremity Ankle Circles/Pumps: AROM Left;5 reps;Seated (encouraged to continue for circulation/edema mgmt)   PT Diagnosis: Generalized weakness;Acute pain  PT Problem List:  Decreased activity tolerance;Pain;Decreased mobility;Decreased safety awareness;Decreased knowledge of use of DME;Decreased strength PT Treatment Interventions: DME instruction;Functional mobility training;Therapeutic activities;Wheelchair mobility training;Therapeutic exercise;Patient/family education   PT Goals Acute Rehab PT Goals PT Goal Formulation: With patient Time For Goal Achievement: 11/12/12 Potential to Achieve Goals: Good Pt will go Supine/Side to Sit: with modified independence PT Goal: Supine/Side to Sit - Progress: Goal set today Pt will go Sit to Supine/Side: with modified independence PT Goal: Sit to Supine/Side - Progress: Goal set today Pt will Transfer Bed to Chair/Chair to Bed: with modified independence (with safe technique) PT Transfer Goal: Bed to Chair/Chair to Bed - Progress: Goal set today Pt will Propel Wheelchair: 51 - 150 feet;with modified independence PT Goal: Propel Wheelchair - Progress: Goal set today  Visit Information  Last PT Received On: 11/05/12 Assistance Needed: +1    Subjective Data  Subjective: I don't like pain Patient Stated Goal: To go home with daughter   Prior Functioning  Home Living Lives With: Daughter Available Help at Discharge: Family Type of Home: House Home Access: Ramped entrance Home Layout: One level Bathroom Toilet: Standard Home Adaptive Equipment: Bedside commode/3-in-1;Walker - rolling;Wheelchair - manual Prior Function Level of Independence: Needs assistance Needs Assistance: Light Housekeeping;Meal Prep;Bathing Bath: Supervision/set-up (sponge bathes) Meal Prep: Total Light Housekeeping: Total Communication Communication: Expressive difficulties (mumbles and is low talker)    Cognition  Overall Cognitive Status: Impaired Area of Impairment: Memory;Problem solving;Safety/judgement Arousal/Alertness: Awake/alert Behavior During Session: [facility] for tasks performed Memory: Decreased recall of  precautions Memory Deficits: had phone off hook, forgot she didn't hang it up; difficulty recalling if had BSC at home initially (called it a "walker"  she says) Safety/Judgement: Decreased awareness of need for assistance;Decreased awareness of safety precautions Safety/Judgement - Other Comments: attempting back to bed from chair with foot rest still up, poor technique with transfers to chair and 3:1, etc; assist necessary for safety Problem Solving: Doesn't want to sit up OOB; more comfortable in bed    Extremity/Trunk Assessment Right Lower Extremity Assessment RLE ROM/Strength/Tone: [facility] for tasks assessed Left Lower Extremity Assessment LLE ROM/Strength/Tone: Deficits LLE ROM/Strength/Tone Deficits: transmet amputation, groin wound, difficulty lifting antigravity; intermittently able to reposition without help; noted edema and warmth throughout LE   Balance Balance Balance Assessed: Yes Static Sitting Balance Static Sitting - Balance Support: Feet unsupported;Right upper extremity supported Static Sitting - Level of Assistance: 6: Modified independent (Device/Increase time)  End of Session PT - End of Session Equipment Utilized During Treatment: Gait belt Activity Tolerance: Patient limited by pain (would not tolerate staying up in chair) Patient left: in bed;with call bell/phone within reach;with bed alarm set  GP     [facility] 11/05/2012, 10:42 AM  Sheran Lawless, PT [phone] 11/05/2012

## 2012-11-05 NOTE — Evaluation (Signed)
Occupational Therapy Evaluation Patient Details Name: Sherri Brooks MRN: [id number] DOB: 12-Sep-1946 Today's Date: 11/05/2012 Time: 1027-2536 OT Time Calculation (min): 17 min  OT Assessment / Plan / Recommendation Clinical Impression  Pt s/p left BK pop bypass revision.  Pt demonstrating decreased activity tolerance during session today.  Pt will benefit from continued OT services to maximize independence and safety with ADLs.  Recommending HHOT at this time, but may need to update discharge plan pending pt progress since pt does not seem to have 24/7 sup/assist at home.     OT Assessment  Patient needs continued OT Services    Follow Up Recommendations  Home health OT    Barriers to Discharge Decreased caregiver support Pt reports her daughter works during the day and granddaughter goes to school.    Equipment Recommendations  None recommended by OT    Recommendations for Other Services    Frequency  Min 2X/week    Precautions / Restrictions Precautions Precautions: Fall Precaution Comments: left transmet amp   Pertinent Vitals/Pain See vitals    ADL  Grooming: Performed;Wash/dry hands;Wash/dry face;Set up Where Assessed - Grooming: Unsupported sitting Upper Body Bathing: Simulated;Set up Where Assessed - Upper Body Bathing: Unsupported sitting Lower Body Bathing: Simulated;Moderate assistance Where Assessed - Lower Body Bathing: Unsupported sitting Upper Body Dressing: Simulated;Set up Where Assessed - Upper Body Dressing: Unsupported sitting Lower Body Dressing: Simulated;Moderate assistance Where Assessed - Lower Body Dressing: Unsupported sitting Equipment Used:  (none) ADL Comments: Pt limited by pain and unable to tolerate sitting EOB >2 min. Noted in pt chart that Hgb is trending down and is currently 7.5. ? Possibly contributing to decreased activity tolerance (RN aware).   OT Diagnosis: Generalized weakness;Acute pain  OT Problem List: Decreased activity  tolerance;Decreased strength;Decreased knowledge of use of DME or AE;Decreased cognition;Pain OT Treatment Interventions: Self-care/ADL training;DME and/or AE instruction;Therapeutic activities;Patient/family education   OT Goals Acute Rehab OT Goals OT Goal Formulation: With patient Time For Goal Achievement: 11/19/12 Potential to Achieve Goals: Good ADL Goals Pt Will Perform Lower Body Bathing: with modified independence;Sit to stand from chair;Sit to stand from bed ADL Goal: Lower Body Bathing - Progress: Goal set today Pt Will Perform Lower Body Dressing: with modified independence;Sit to stand from chair;Sit to stand from bed ADL Goal: Lower Body Dressing - Progress: Goal set today Pt Will Transfer to Toilet: with modified independence;Stand pivot transfer;with DME;3-in-1 ADL Goal: Toilet Transfer - Progress: Goal set today Pt Will Perform Toileting - Clothing Manipulation: with modified independence;Sitting on 3-in-1 or toilet;Standing ADL Goal: Toileting - Clothing Manipulation - Progress: Goal set today Pt Will Perform Toileting - Hygiene: with modified independence;Sit to stand from 3-in-1/toilet;Standing at 3-in-1/toilet ADL Goal: Toileting - Hygiene - Progress: Goal set today Miscellaneous OT Goals Miscellaneous OT Goal #1: Pt will perform all functional mobility at mod I level. OT Goal: Miscellaneous Goal #1 - Progress: Goal set today  Visit Information  Last OT Received On: 11/05/12 Assistance Needed: +1    Subjective Data      Prior Functioning     Home Living Lives With: Daughter (granddaughter) Available Help at Discharge: Family;Available PRN/intermittently Type of Home: House Home Access: Ramped entrance Home Layout: One level Bathroom Shower/Tub:  (pt sponge bathes) Bathroom Toilet: Standard Home Adaptive Equipment: Bedside commode/3-in-1;Walker - rolling;Wheelchair - manual Prior Function Level of Independence: Needs assistance Needs Assistance: Light  Housekeeping;Meal Prep;Bathing Bath: Supervision/set-up Meal Prep: Total Light Housekeeping: Total Communication Communication: No difficulties  Vision/Perception     Cognition  Overall Cognitive Status: Impaired Area of Impairment: Memory Arousal/Alertness: Awake/alert Orientation Level: Disoriented to;Time;Situation Behavior During Session: WFL for tasks performed Memory: Decreased recall of precautions Memory Deficits: varying responses to questions regarding PLOF     Extremity/Trunk Assessment Right Upper Extremity Assessment RUE ROM/Strength/Tone: WFL for tasks assessed Left Upper Extremity Assessment LUE ROM/Strength/Tone: WFL for tasks assessed Right Lower Extremity Assessment RLE ROM/Strength/Tone: [facility] for tasks assessed Left Lower Extremity Assessment LLE ROM/Strength/Tone: Deficits LLE ROM/Strength/Tone Deficits: transmet amputation, groin wound, difficulty lifting antigravity; intermittently able to reposition without help; noted edema and warmth throughout LE     Mobility Bed Mobility Bed Mobility: Supine to Sit;Sitting - Scoot to [place] of Bed;Sit to Supine Supine to Sit: 5: Supervision;HOB elevated (HOB 30 degrees) Sitting - Scoot to Edge of Bed: 5: Supervision Sit to Supine: 5: Supervision;HOB elevated (HOB 30 degrees) Scooting to HOB: 4: Min assist Details for Bed Mobility Assistance: cues for scooting up and assist for getting linens straightened out Transfers Details for Transfer Assistance: cues and assist needed for safety due to unsafe technique with reaching forward with both hands to armrests on BSC (bed>chair>BSC>chair>bed); minimal weight on right foot     Shoulder Instructions     Exercise    Balance Balance Balance Assessed: Yes Static Sitting Balance Static Sitting - Balance Support: Feet unsupported;No upper extremity supported Static Sitting - Level of Assistance: 6: Modified independent (Device/Increase time) Static Sitting -  Comment/# of Minutes: 2 minutes   End of Session OT - End of Session Equipment Utilized During Treatment:  (none) Activity Tolerance: Patient limited by fatigue;Patient limited by pain Patient left: with bed alarm set;with call bell/phone within reach;in bed Nurse Communication: Mobility status  GO    11/05/2012 Cipriano Mile OTR/L Pager [phone] Office [phone]  Cipriano Mile 11/05/2012, 1:45 PM

## 2012-11-05 NOTE — Plan of Care (Signed)
Problem: Phase I Progression Outcomes Goal: OOB as tolerated unless otherwise ordered Outcome: Progressing Though able to get OOB, doesn't tolerate for long.

## 2012-11-06 ENCOUNTER — Inpatient Hospital Stay (HOSPITAL_COMMUNITY): Payer: PRIVATE HEALTH INSURANCE

## 2012-11-06 DIAGNOSIS — D649 Anemia, unspecified: Secondary | ICD-10-CM

## 2012-11-06 LAB — URINALYSIS, ROUTINE W REFLEX MICROSCOPIC
Glucose, UA: 100 mg/dL — AB
Ketones, ur: NEGATIVE mg/dL
Leukocytes, UA: NEGATIVE
pH: 5 (ref 5.0–8.0)

## 2012-11-06 LAB — URINE MICROSCOPIC-ADD ON

## 2012-11-06 LAB — GLUCOSE, CAPILLARY
Glucose-Capillary: 250 mg/dL — ABNORMAL HIGH (ref 70–99)
Glucose-Capillary: 166 mg/dL — ABNORMAL HIGH (ref 70–99)

## 2012-11-06 LAB — CBC
Hemoglobin: 7.2 g/dL — ABNORMAL LOW (ref 12.0–15.0)
MCH: 25.5 pg — ABNORMAL LOW (ref 26.0–34.0)
MCHC: 33.3 g/dL (ref 30.0–36.0)
RDW: 18.7 % — ABNORMAL HIGH (ref 11.5–15.5)

## 2012-11-06 NOTE — Progress Notes (Signed)
Physical Therapy Treatment Patient Details Name: Sherri Brooks MRN: [id number] DOB: 12/20/1945 Today's Date: 11/06/2012 Time: 1002-1027 PT Time Calculation (min): 25 min  PT Assessment / Plan / Recommendation Comments on Treatment Session  Patient with improved safety awareness today asking can she put weight on her left foot now.  Have noteed no cautions for weight bearing on left; will attempt to clarify with MD.  Feel safety issues may be pre-hospitalization, but agree with HHPT if daughter available to stay with patient/    Follow Up Recommendations  Home health PT;Supervision/Assistance - 24 hour           Equipment Recommendations  None recommended by PT       Frequency Min 3X/week   Plan Discharge plan remains appropriate    Precautions / Restrictions Precautions Precautions: Fall Precaution Comments: left transmet amp Restrictions Weight Bearing Restrictions: No   Pertinent Vitals/Pain 5/10 left LE with movement, RN aware    Mobility  Bed Mobility Bed Mobility: Supine to Sit;Sitting - Scoot to Edge of Bed Supine to Sit: 6: Modified independent (Device/Increase time) Sitting - Scoot to Edge of Bed: 6: Modified independent (Device/Increase time) Details for Bed Mobility Assistance: up in recliner this session Transfers Transfers: Stand Pivot Transfers Sit to Stand: 4: Min assist;With armrests;From chair/3-in-1 Stand to Sit: 4: Min assist;To chair/3-in-1;With upper extremity assist Squat Pivot Transfers: 4: Min assist;With upper extremity assistance (with walker chair to 3:1 an back to chair min weight left LE) Details for Transfer Assistance: sits last few inches uncontrolled on recliner (better to 3:1 with use of armrests)    Exercises General Exercises - Lower Extremity Ankle Circles/Pumps: AROM;Seated;20 reps;Left Long Arc Quad: AROM;Both;10 reps;Seated Heel Slides: AROM;AAROM;Right;Left;10 reps;15 reps;Seated    PT Goals Acute Rehab PT Goals Pt will  Transfer Bed to Chair/Chair to Bed: with modified independence PT Transfer Goal: Bed to Chair/Chair to Bed - Progress: Progressing toward goal Pt will Stand: with modified independence;with unilateral upper extremity support;3 - 5 min (during functional activity) PT Goal: Stand - Progress: Goal set today  Visit Information  Last PT Received On: 11/06/12 Assistance Needed: +1    Subjective Data  Subjective: 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  Overall Cognitive Status: Impaired Area of Impairment: Engineer, drilling;Awareness of deficits Arousal/Alertness: Awake/alert Orientation Level: Disoriented to;Time (able to recall month and day but not year ) Behavior During Session: [facility] for tasks performed Safety/Judgement: Decreased safety judgement for tasks assessed;Decreased awareness of need for assistance Safety/Judgement - Other Comments: uncontrolled descent into recliner, chair alarm placed for safety Awareness of Deficits: Pt reporting that her 30 yr old granddaughter can assist with transfers if daughter needs to run to the store.  Educated pt that this is not a safe option and after some explanation pt verbalizing that having her young granddaughter assist is not an appropriate plan.  Now reports her neighbor can sit with her if daughter needs to run to the store. Cognition - Other Comments: Question if pt is at baseline level.  No family available to provide information    Balance  Dynamic Standing Balance Dynamic Standing - Balance Support: During functional activity;Left upper extremity supported Dynamic Standing - Level of Assistance: 4: Min assist Dynamic Standing - Comments: standing to perform hygiene after toileting approx 1.5 minutes with minguard for safety  End of Session PT - End of Session Equipment Utilized During Treatment: Gait belt Activity Tolerance: Patient tolerated treatment well Patient left: in chair;with call bell/phone within reach;with chair alarm  set Nurse  Communication: Patient requests pain meds   GP     [facility] 11/06/2012, 11:46 AM Sheran Lawless, PT [phone] 11/06/2012

## 2012-11-06 NOTE — Care Management Note (Addendum)
    Page 1 of 2   11/07/2012     2:23:04 PM   CARE MANAGEMENT NOTE 11/07/2012  Patient:  Sherri Brooks, Sherri Brooks   Account Number:  [id number]  Date Initiated:  11/06/2012  Documentation initiated by:  Cynethia Schindler  Subjective/Objective Assessment:   PT S/P REDO LT FEM POP BYPASS GRAFT.  PTA, PT INDEPENDENT, LIVES WITH DAUGHTER.     Action/Plan:   MET WITH PT TO DISCUSS DC PLANS.  PT AGREEABLE TO HHPT. DAUGHTER TO PROVIDE CARE AT DC.  SHE REQUESTS [facility] FOR HOME.   Anticipated DC Date:  11/07/2012   Anticipated DC Plan:  HOME W HOME HEALTH SERVICES      DC Planning Services  CM consult      [facility] Choice  HOME HEALTH   Choice offered to / List presented to:  C-1 Patient   DME arranged  Levan Hurst      DME agency  [facility]     HH arranged  HH-2 PT      [facility] agency  [facility]   Status of service:  Completed, signed off Medicare Important Message given?   (If response is "NO", the following Medicare IM given date fields will be blank) Date Medicare IM given:   Date Additional Medicare IM given:    Discharge Disposition:  HOME W HOME HEALTH SERVICES  Per UR Regulation:  Reviewed for med. necessity/level of care/duration of stay  If discussed at Long Length of Stay Meetings, dates discussed:    Comments:  11/07/12 Sherri Fanning Rayyan Burley,RN,BSN [phone] PT REQUESTS RW FOR HOME.  ORDER OBTAINED AND REFERRAL TO [facility] FOR DME NEEDS.

## 2012-11-06 NOTE — Progress Notes (Addendum)
VVS progress note  Pt Hgb down. Pt with HX GI polyp. ? bleed Chronic anemia Will DC Lovenox and await guiac stool.  Pt to have blood transfusion today. Pt had had transfusion pre and intra-op Recheck labs in am  Della Goo, [place]  Agree with above.  Cari Caraway Beeper [phone] 11/06/2012

## 2012-11-06 NOTE — Progress Notes (Addendum)
VASCULAR PROGRESS NOTE  2 Days Post-Op s/p: redo left femoral to below knee popliteal artery bypass graft with PTFE  SUBJECTIVE: no specific complaints.  PHYSICAL EXAM: Filed Vitals:   11/05/12 1221 11/05/12 1611 11/05/12 2023 11/06/12 0444  BP: 178/77 135/57 153/53 129/58  Pulse: 106 92 103 100  Temp: 98.8 F (37.1 C)  99 F (37.2 C) 99.1 F (37.3 C)  TempSrc: Oral  Oral Oral  Resp: 18  18 18   Height:      Weight:      SpO2: 96%  96% 96%   Brisk Doppler flow left foot in the dorsalis pedis and posterior tibial positions. Wound inspected. Some drainage. No exposed bone.  LABS: Lab Results  Component Value Date   WBC 20.5* 11/06/2012   HGB 7.2* 11/06/2012   HCT 21.6* 11/06/2012   MCV 76.6* 11/06/2012   PLT 304 11/06/2012   Lab Results  Component Value Date   CREATININE 0.99 11/05/2012   Lab Results  Component Value Date   INR 1.00 11/03/2012   CBG (last 3)   Basename 11/06/12 0639 11/05/12 2056 11/05/12 1625  GLUCAP 142* 152* 121*   Principal Problem:  *Atherosclerotic PVD with ulceration Active Problems:  Type II or unspecified type diabetes mellitus with peripheral circulatory disorders, uncontrolled(250.72)  Tobacco use disorder  Open wound of left foot  Anemia of unknown etiology   ASSESSMENT AND PLAN: 1. 2 Days Post-Op s/p: Redo left femoral to below knee popliteal artery bypass graft with PTFE. Graft is patent 2. Anemia of unknown etiology: Patient's hemoglobin continues to drift down slightly. Hemoglobin today is 7.2. There was no significant blood loss at the time of surgery. She denies any melena or hematochezia. Will transfuse 2 units packed red blood cells. Will guaiac his stools. Will likely need follow up with her primary care physician once discharged. 3. Diabetes management: CBG's are under good control on her home meds. 4. Infection left foot: white blood cell count has increased to 20,000. Wound has some drainage but no exposed bone. Will obtain  x-ray of foot to rule out osteomyelitis. Continue Keflex. Her highest temp is 99.1. We will also obtain a urine culture. 5. Tobacco use. She plans on quitting this admission.  Cari Caraway Beeper: [phone] 11/06/2012

## 2012-11-06 NOTE — Progress Notes (Signed)
Occupational Therapy Treatment Patient Details Name: Sherri Brooks MRN: [id number] DOB: [birth year] Today's Date: 11/06/2012 Time: 5638-7564 OT Time Calculation (min): 17 min  OT Assessment / Plan / Recommendation Comments on Treatment Session Pt not limited by pain today but continues to demosntrate cognitive impairment (question if this is baseline).  Pt reporting she will have 24/7 assist from daughter at home and reports daughter does not work.  Feel that pt could safely return home if able to progress to mod I level with stand pivot transfers since she does have a w/c at home.  Continue recommending 24/7 supervision/assist at home.    Follow Up Recommendations  Home health OT;Supervision/Assistance - 24 hour    Barriers to Discharge       Equipment Recommendations  None recommended by OT    Recommendations for Other Services    Frequency Min 2X/week   Plan Discharge plan remains appropriate    Precautions / Restrictions Precautions Precautions: Fall Precaution Comments: left transmet amp Restrictions Weight Bearing Restrictions: No   Pertinent Vitals/Pain See vitals    ADL  Toilet Transfer: Simulated;Minimal assistance Toilet Transfer Method: Stand pivot Toilet Transfer Equipment: Other (comment) (bed to chair) Equipment Used: Rolling walker;Gait belt Transfers/Ambulation Related to ADLs: min assist with RW during SPT from bed to chair.  Pt performed transfer NWB on L LE (although WB status not given by MD).  VC for safety and technique. ADL Comments: Pt requires moderate encouragement to perform transfer OOB (reports "I just love laying in bed so much")  but very pleasant and willing to work with OT.  Discussed in great length pt need for someone to be home 24/7 for supervision/assist especially with transfers.  Pt reporting that her daughter can be home 24/7 and neighbor can come sit with her if daugther needs to run errands.  Discussed with pt option of SNF if daughter  reports she is unable to provide necessary supervision/assist and pt verbalized undestanding and agreeable.      OT Diagnosis:    OT Problem List:   OT Treatment Interventions:     OT Goals ADL Goals Pt Will Transfer to Toilet: with modified independence;Stand pivot transfer;with DME;3-in-1 ADL Goal: Toilet Transfer - Progress: Progressing toward goals Miscellaneous OT Goals Miscellaneous OT Goal #1: Pt will perform all functional mobility at mod I level. OT Goal: Miscellaneous Goal #1 - Progress: Progressing toward goals  Visit Information  Last OT Received On: 11/06/12 Assistance Needed: +1    Subjective Data      Prior Functioning       Cognition  Overall Cognitive Status: Impaired Area of Impairment: Safety/judgement;Awareness of deficits Arousal/Alertness: Awake/alert Orientation Level: Disoriented to;Time (able to recall month and day but not year ) Behavior During Session: [facility] for tasks performed Safety/Judgement: Decreased safety judgement for tasks assessed;Decreased awareness of need for assistance Safety/Judgement - Other Comments: Pt attempting to sit before being safely positioned in front of chair. Awareness of Deficits: Pt reporting that her 68 yr old granddaughter can assist with transfers if daughter needs to run to the store.  Educated pt that this is not a safe option and after some explanation pt verbalizing that having her young granddaughter assist is not an appropriate plan.  Now reports her neighbor can sit with her if daughter needs to run to the store. Cognition - Other Comments: Question if pt is at baseline level.  No family available to provide information    Mobility  Shoulder Instructions Bed Mobility Bed Mobility:  Supine to Sit;Sitting - Scoot to Edge of Bed Supine to Sit: 6: Modified independent (Device/Increase time) Sitting - Scoot to Edge of Bed: 6: Modified independent (Device/Increase time) Transfers Transfers: Sit to Stand;Stand to  Sit Sit to Stand: 4: Min assist;From bed;With upper extremity assist Stand to Sit: 4: Min assist;To chair/3-in-1;With armrests;With upper extremity assist Details for Transfer Assistance: Assist for steadying an to control descent to chair.  Pt attempting to sit before safely positioned in front of chair.  VC for hand placement and technique.       Exercises      Balance     End of Session OT - End of Session Equipment Utilized During Treatment: Gait belt Activity Tolerance: Patient tolerated treatment well Patient left: in chair;with call bell/phone within reach Nurse Communication: Mobility status  GO   11/06/2012 Cipriano Mile OTR/L Pager [phone] Office [phone]   Cipriano Mile 11/06/2012, 9:53 AM

## 2012-11-06 NOTE — Plan of Care (Signed)
Problem: Phase II Progression Outcomes Goal: Progress activity as tolerated unless otherwise ordered Outcome: Progressing Tolerated out of bed longer today with nursing encouragement.

## 2012-11-07 ENCOUNTER — Telehealth: Payer: Self-pay | Admitting: Vascular Surgery

## 2012-11-07 ENCOUNTER — Encounter (HOSPITAL_COMMUNITY): Payer: Self-pay | Admitting: Vascular Surgery

## 2012-11-07 LAB — CBC
Hemoglobin: 9.7 g/dL — ABNORMAL LOW (ref 12.0–15.0)
MCH: 26.2 pg (ref 26.0–34.0)
RBC: 3.7 MIL/uL — ABNORMAL LOW (ref 3.87–5.11)
WBC: 16.4 10*3/uL — ABNORMAL HIGH (ref 4.0–10.5)

## 2012-11-07 LAB — URINE CULTURE: Colony Count: NO GROWTH

## 2012-11-07 LAB — GLUCOSE, CAPILLARY
Glucose-Capillary: 289 mg/dL — ABNORMAL HIGH (ref 70–99)
Glucose-Capillary: 215 mg/dL — ABNORMAL HIGH (ref 70–99)

## 2012-11-07 LAB — TYPE AND SCREEN: Unit division: 0

## 2012-11-07 MED ORDER — CEPHALEXIN 500 MG PO CAPS: 500.0000 mg | ORAL_CAPSULE | Freq: Three times a day (TID) | ORAL | Status: AC

## 2012-11-07 MED ORDER — OXYCODONE-ACETAMINOPHEN 5-325 MG PO TABS: 1.0000 | ORAL_TABLET | ORAL | Status: DC | PRN

## 2012-11-07 NOTE — Discharge Summary (Signed)
Patient ID: Sherri Brooks MRN: [id number] DOB/AGE: [birth year] 67 y.o.  Admit date: 11/03/2012 Discharge date: 11/07/2012  Admission Diagnosis: Anemia [285.9] Hypoglycemia [251.2] PAD peripheral artery disease   Discharge Diagnoses:  Anemia [285.9] Hypoglycemia [251.2] PAD peripheral artery disease   Secondary Diagnoses: Past Medical History  Diagnosis Date  . Diabetes mellitus   . Hypertension   . Hyperlipidemia   . PVD (peripheral vascular disease)   . Cellulitis of buttock, left 2010  . Arthritis   . GERD (gastroesophageal reflux disease)   . CAD (coronary artery disease)   . MI (myocardial infarction)    Procedures: 11/03/2012 - 11/04/2012 Surgeon(s): Chuck Hint, MD Procedure(s): BYPASS GRAFT FEMORAL-POPLITEAL ARTERY Left  Discharged Condition: good  HPI: This is a 67 year old woman who initially presented with an extensive infection of her left foot in 2011. She underwent a left femoral to below knee popliteal artery bypass with PTFE graft and a left transmetatarsal amputation. She did not have adequate vein for a vein bypass. She presented with a wound on the lateral aspect of her transmetatarsal amputation site and had a chronically occluded left femoropopliteal bypass graft. She underwent an arteriogram and had a tight focal left external iliac artery stenosis which was successfully ballooned and stented. She presents for redo left femoral to below knee popliteal artery bypass. The remainder of her history and physical is as dictated without addition or deletion.  Hospital Course: The patient was originally scheduled to come in the morning of surgery. However, she was feeling dizzy and weak and was found to have a hemoglobin of 7.4 and a glucose of 35. She was admitted preoperatively for transfusion and also to better manage her blood sugar. She was taken to the operating room on 11/04/2012 and underwent a redo left femoral to above-knee popliteal artery bypass  with PTFE. Her blood sugars were well controlled during her admission. Tobacco cessation was consulted and she plans on attempting to quit tobacco. By postoperative day #1 she was ready for transfer to 2000. Her wound gradually improved. We did obtain an x-ray which showed no evidence of osteomyelitis. She was placed on Keflex given that she has a prosthetic graft and the risk of infection in the foot. He did receive an additional 2 units of packed red blood cells for low hemoglobin. There was no significant blood loss time of surgery. By 11/07/2012 was felt that she was ready for discharge. We instructed her to follow up with her primary care physician to have her hemoglobin check. If she continues to have problems with dropping hemoglobin she will need evaluation by her gastroenterologist. Reportedly she has a history of colonic polyp. At the time of discharge her graft was patent with excellent Doppler flow in the left foot and an ABI of 78%. ABI on the right with 71%.  Consults: none    Significant Diagnostic Studies:  Dg Foot 2 Views Left  11/06/2012  *RADIOLOGY REPORT*  Clinical Data: Diabetic foot.  Question osteomyelitis.  LEFT FOOT - 2 VIEW  Comparison: 07/07/2010  Findings: Changes of prior transmetatarsal amputation of all five digits.  Soft tissue defect noted over the remaining left fifth metatarsal distally.  I see no radiographic changes of osteomyelitis.  IMPRESSION: Prior transmetatarsal amputations.  No radiographic changes to suggest osteomyelitis.   Original Report Authenticated By: Charlett Nose, M.D.     CBC    Component Value Date/Time   WBC 16.4* 11/07/2012 0400   RBC 3.70* 11/07/2012 0400   HGB 9.7*  11/07/2012 0400   HCT 28.8* 11/07/2012 0400   PLT 284 11/07/2012 0400   MCV 77.8* 11/07/2012 0400   MCH 26.2 11/07/2012 0400   MCHC 33.7 11/07/2012 0400   RDW 17.9* 11/07/2012 0400   LYMPHSABS 1.8 11/03/2012 1215   MONOABS 0.8 11/03/2012 1215   EOSABS 0.1 11/03/2012 1215   BASOSABS  0.0 11/03/2012 1215    BMET    Component Value Date/Time   NA 136 11/05/2012 0555   NA 140 08/22/2011 0924   K 4.0 11/05/2012 0555   K 3.8 08/22/2011 0924   CL 104 11/05/2012 0555   CL 107 08/22/2011 0924   CO2 22 11/05/2012 0555   CO2 25 08/22/2011 0924   GLUCOSE 212* 11/05/2012 0555   BUN 17 11/05/2012 0555   CREATININE 0.99 11/05/2012 0555   CALCIUM 8.4 11/05/2012 0555   GFRNONAA 58* 11/05/2012 0555   GFRAA 67* 11/05/2012 0555    COAG Lab Results  Component Value Date   INR 1.00 11/03/2012   INR 1.11 07/27/2010   INR 1.26 01/16/2010   No results found for this basename: PTT    Disposition: 01-Home or Self Care  Discharge Orders    Future Appointments: Provider: Department: Dept Phone: Center:   12/03/2012 12:30 PM Chuck Hint, MD [facility] [phone] [facility]     Future Orders Please Complete By Expires   Resume previous diet      Call MD for:  temperature >100.5      Call MD for:  redness, tenderness, or signs of infection (pain, swelling, bleeding, redness, odor or green/yellow discharge around incision site)      Call MD for:  severe or increased pain, loss or decreased feeling  in affected limb(s)      Discharge instructions      Comments:   The office will call to arrange a follow up appointment in 2 weeks. Call sooner if problems [phone]. Please make an appointment to follow up with your primary care physician to have your hemoglobin checked. Please report any bloody stools or black stools to your primary care physician.       Medication List     As of 11/07/2012  7:35 AM    TAKE these medications         albuterol 108 (90 BASE) MCG/ACT inhaler   Commonly known as: PROVENTIL HFA;VENTOLIN HFA   Inhale 2 puffs into the lungs every 6 (six) hours as needed. For bronchitis and coughing      alendronate 70 MG tablet   Commonly known as: FOSAMAX   Take 70 mg by mouth every 7 (seven) days. On Mondays      amLODipine 5 MG tablet    Commonly known as: NORVASC   Take 5 mg by mouth daily.      aspirin 325 MG EC tablet   Take 325 mg by mouth daily.      cephALEXin 500 MG capsule   Commonly known as: KEFLEX   Take 1 capsule (500 mg total) by mouth 3 (three) times daily.      clopidogrel 75 MG tablet   Commonly known as: PLAVIX   Take 75 mg by mouth daily.      diazepam 5 MG tablet   Commonly known as: VALIUM   Take 5 mg by mouth daily as needed. Takes 15 minutes prior to office visit for HyperChamber therapy      hydrochlorothiazide 25 MG tablet   Commonly known as: HYDRODIURIL  Take 25 mg by mouth daily.      HYDROmorphone 2 MG tablet   Commonly known as: DILAUDID   Take 2 mg by mouth every 8 (eight) hours as needed. pain      insulin glargine 100 UNIT/ML injection   Commonly known as: LANTUS   Inject 10 Units into the skin at bedtime.      labetalol 200 MG tablet   Commonly known as: NORMODYNE   Take 200 mg by mouth 2 (two) times daily.      LORazepam 0.5 MG tablet   Commonly known as: ATIVAN   Take 0.5 mg by mouth at bedtime.      multivitamin with minerals Tabs   Take 1 tablet by mouth daily.      oxyCODONE-acetaminophen 5-325 MG per tablet   Commonly known as: PERCOCET/ROXICET   Take 1-2 tablets by mouth every 4 (four) hours as needed for pain.      simvastatin 40 MG tablet   Commonly known as: ZOCOR   Take 40 mg by mouth at bedtime.         Signed: Pritika Alvarez S 11/07/2012, 7:35 AM

## 2012-11-07 NOTE — Progress Notes (Signed)
VASCULAR PROGRESS NOTE  3 Days Post-Op s/p: Left femoral to below knee popliteal artery bypass with PTFE  SUBJECTIVE: no complaints this morning.  PHYSICAL EXAM: Filed Vitals:   11/06/12 2036 11/06/12 2147 11/07/12 0609 11/07/12 0639  BP: 134/81 136/79 164/65 148/66  Pulse: 86 84 102   Temp: 98.2 F (36.8 C) 98.4 F (36.9 C) 97.7 F (36.5 C)   TempSrc: Oral Oral Oral   Resp:  17 18   Height:      Weight:   129 lb 13.6 oz (58.9 kg)   SpO2: 99% 98% 96%    Brisk Doppler flow in the left foot and posterior tibial peroneal and dorsalis pedis positions. Wound inspected. The wound looks cleaner with the beginning of some good granulation tissue. Lungs clear.  LABS: Lab Results  Component Value Date   WBC 16.4* 11/07/2012   HGB 9.7* 11/07/2012   HCT 28.8* 11/07/2012   MCV 77.8* 11/07/2012   PLT 284 11/07/2012   Lab Results  Component Value Date   CREATININE 0.99 11/05/2012   CBG (last 3)   Basename 11/07/12 0606 11/06/12 2035 11/06/12 1627  GLUCAP 289* 166* 184*   Principal Problem:  *Atherosclerotic PVD with ulceration Active Problems:  Type II or unspecified type diabetes mellitus with peripheral circulatory disorders, uncontrolled(250.72)  Tobacco use disorder  Open wound of left foot  Anemia of unknown etiology  ASSESSMENT AND PLAN: 1. 3 Days Post-Op s/p: Left femoral to below knee popliteal artery bypass with PTFE. Graft is patent with good blood flow to the left foot. 2. Anemia: Hemoglobin has improved appropriately after 2 units of packed red blood cells. She will need to follow up with her primary care physician for follow up CBC. If her hemoglobin continues to drift down, she will need follow up with her gastroenterologist. Reportedly she has a history of a colonic polyp. 3. Diabetes management: CBG's have been under good control although this morning's was elevated. He remains afebrile and her white blood cell count is improving. No evidence of osteomyelitis on  x-ray. 4. Wound left foot is improving. Continue Keflex on discharge given that she has a prosthetic graft. 5. Patient seems committed to attempting to quit tobacco. 6. Plan discharge today.   Cari Caraway Beeper: [phone] 11/07/2012

## 2012-11-07 NOTE — Progress Notes (Signed)
D/c instructions given to pt and daughter Marylene Land; pt to d/c home with daughter; IV and tele monitor d/c at this time; will cont. To monitor.

## 2012-11-07 NOTE — Telephone Encounter (Signed)
Message copied by Fredrich Birks on Fri Nov 07, 2012  3:25 PM ------      Message from: Melene Plan      Created: Fri Nov 07, 2012  2:34 PM      Regarding: FW: f/u                   ----- Message -----         From: Chuck Hint, MD         Sent: 11/07/2012   1:20 PM           To: Reuel Derby, Melene Plan, RN      Subject: f/u                                                      She was discharged today. She will need a follow up visit in 2-3 weeks.            Thank you. CSD

## 2012-11-07 NOTE — Telephone Encounter (Signed)
lvm for pt notification, dpm

## 2012-11-09 LAB — WOUND CULTURE

## 2012-11-12 ENCOUNTER — Other Ambulatory Visit: Payer: Self-pay

## 2012-11-12 DIAGNOSIS — G8918 Other acute postprocedural pain: Secondary | ICD-10-CM

## 2012-11-12 MED ORDER — HYDROCODONE-ACETAMINOPHEN 5-325 MG PO TABS: ORAL_TABLET | ORAL | Status: DC

## 2012-11-12 NOTE — Telephone Encounter (Signed)
Pt. Called for pain medication.  Rates pain at "5/10", and states pain is intermittent.  Denies fever/ chills.  States her incisions look good; denies redness or drainage from incisional areas.  Advised will phone in refill of Vicodin per standing order.  Agrees w/ plan.

## 2012-11-25 ENCOUNTER — Encounter: Payer: Self-pay | Admitting: Vascular Surgery

## 2012-11-26 ENCOUNTER — Ambulatory Visit (INDEPENDENT_AMBULATORY_CARE_PROVIDER_SITE_OTHER): Payer: Medicare Other | Admitting: Vascular Surgery

## 2012-11-26 ENCOUNTER — Encounter: Payer: Self-pay | Admitting: Vascular Surgery

## 2012-11-26 VITALS — BP 189/66 | HR 83 | Ht 63.0 in | Wt 135.0 lb

## 2012-11-26 DIAGNOSIS — I739 Peripheral vascular disease, unspecified: Secondary | ICD-10-CM

## 2012-11-26 DIAGNOSIS — L98499 Non-pressure chronic ulcer of skin of other sites with unspecified severity: Secondary | ICD-10-CM

## 2012-11-26 NOTE — Progress Notes (Signed)
[facility]  Patient name: Sherri Brooks MRN: [id number] DOB: [birth year] Sex: female  REASON FOR VISIT: follow up of left transmetatarsal amputation and left femoropopliteal bypass graft  HPI: Sherri Brooks is a 67 y.o. female who had undergone a previous left femoral to below knee popliteal artery bypass graft with a prosthetic graft in 2011 and a left transmetatarsal amputation. She presented with a nonhealing wound on her left transmetatarsal amputation site. She underwent a redo left femoral to below knee popliteal artery bypass graft with a 6 mm PTFE graft on 11/04/2012. Comes in for a routine wound check on her transmetatarsal amputation site. She denies fever or chills. His been no significant drainage from the wound.   REVIEW OF SYSTEMS: Arly.Keller ] denotes positive finding; [  ] denotes negative finding  CARDIOVASCULAR:  [ ]  chest pain   [ ]  dyspnea on exertion    CONSTITUTIONAL:  [ ]  fever   [ ]  chills  PHYSICAL EXAM: Filed Vitals:   11/26/12 1236  BP: 189/66  Pulse: 83  Height: 5\' 3"  (1.6 m)  Weight: 135 lb (61.236 kg)  SpO2: 100%   Body mass index is 23.92 kg/(m^2). GENERAL: The patient is a well-nourished female, in no acute distress. The vital signs are documented above. CARDIOVASCULAR: There is a regular rate and rhythm  PULMONARY: There is good air exchange bilaterally without wheezing or rales. The left foot is warm and well-perfused. Incisions are healed nicely to The wound on the transmetatarsal amputation site appears to be granulating nicely. There is no drainage. The wound measures approximately 4 cm in diameter.  MEDICAL ISSUES: I have instructed the daughter to do the dressing changes with hydrogel and a moist 4 x 4. This will facilitate healing of the wound. I'll see her back in 4 weeks. Another call sooner she has problems. In addition, she apparently has quit smoking and I think this will significantly improve her chances of healing  this wound.  Bayan Hedstrom S [facility] [place] Beeper: [phone]

## 2012-12-03 ENCOUNTER — Ambulatory Visit: Payer: PRIVATE HEALTH INSURANCE | Admitting: Vascular Surgery

## 2012-12-23 ENCOUNTER — Encounter: Payer: Self-pay | Admitting: Vascular Surgery

## 2012-12-24 ENCOUNTER — Ambulatory Visit (INDEPENDENT_AMBULATORY_CARE_PROVIDER_SITE_OTHER): Payer: PRIVATE HEALTH INSURANCE | Admitting: Vascular Surgery

## 2012-12-24 ENCOUNTER — Encounter: Payer: Self-pay | Admitting: Vascular Surgery

## 2012-12-24 VITALS — BP 164/67 | HR 75 | Resp 16 | Ht 63.0 in | Wt 137.0 lb

## 2012-12-24 DIAGNOSIS — I739 Peripheral vascular disease, unspecified: Secondary | ICD-10-CM

## 2012-12-24 NOTE — Progress Notes (Signed)
[facility]  Patient name: Sherri Brooks MRN: [id number] DOB: Mar 31, 1946 Sex: female  REASON FOR VISIT: follow up after redo left femoral to below knee popliteal artery bypass graft with 6 mm PTFE graft  HPI: Sherri Brooks is a 67 y.o. female who had undergone her previous left transmetatarsal amputation. She developed a nonhealing wound on this left foot and her only further options for revascularization with a prosthetic left femoropopliteal bypass graft. She had a redo left femoral to below knee popliteal artery bypass graft with a 6 mm PTFE graft on 11/04/2012. I have been following the transmetatarsal amputation wound. I last saw her this measured 4 cm in maximum diameter. She came in for routine follow up visit. She states that proximally 3 days ago she developed a blister on her right great toe [place]. He comes in to have this evaluated. Do not get any history of claudication or rest pain on the right side. Her daughter has been doing dressing changes to the left TMA wound.  REVIEW OF SYSTEMS: Arly.Keller ] denotes positive finding; [  ] denotes negative finding  CARDIOVASCULAR:  [ ]  chest pain   [ ]  dyspnea on exertion    CONSTITUTIONAL:  [ ]  fever   [ ]  chills  PHYSICAL EXAM: Filed Vitals:   12/24/12 1413  BP: 164/67  Pulse: 75  Resp: 16  Height: 5\' 3"  (1.6 m)  Weight: 137 lb (62.143 kg)  SpO2: 100%   Body mass index is 24.27 kg/(m^2). GENERAL: The patient is a well-nourished female, in no acute distress. The vital signs are documented above. CARDIOVASCULAR: There is a regular rate and rhythm  PULMONARY: There is good air exchange bilaterally without wheezing or rales. She has a brisk dorsalis pedis and posterior tibial signal on the left with the Doppler and I think her bypass graft is patent. Wound on the left TMA is smaller and now measures 3 cm x 2 cm in diameter. He had a large blister with underlying hematoma on her right great toe  which I debrided in the office today. She has a biphasic right posterior tibial signal with the Doppler.  MEDICAL ISSUES: Her left transmetatarsal amputation site appears to be healing adequately. The daughter we'll continue dressing changes for this. We have instructed the patient and her daughter on wound care for the right great toe where the blister was debrided. We'll do saline dressing changes. I'll see her back in 2 weeks. He knows to call sooner if she has problems.  DICKSON,CHRISTOPHER S [facility] [place] Beeper: [phone]

## 2012-12-28 ENCOUNTER — Emergency Department (HOSPITAL_COMMUNITY)
Admission: EM | Admit: 2012-12-28 | Discharge: 2012-12-28 | Disposition: A | Discharge status: Home / Self Care | Payer: PRIVATE HEALTH INSURANCE | Attending: Emergency Medicine | Admitting: Emergency Medicine

## 2012-12-28 ENCOUNTER — Emergency Department (HOSPITAL_COMMUNITY): Payer: PRIVATE HEALTH INSURANCE

## 2012-12-28 ENCOUNTER — Encounter (HOSPITAL_COMMUNITY): Payer: Self-pay | Admitting: *Deleted

## 2012-12-28 DIAGNOSIS — K219 Gastro-esophageal reflux disease without esophagitis: Secondary | ICD-10-CM | POA: Insufficient documentation

## 2012-12-28 DIAGNOSIS — Z8739 Personal history of other diseases of the musculoskeletal system and connective tissue: Secondary | ICD-10-CM | POA: Insufficient documentation

## 2012-12-28 DIAGNOSIS — E119 Type 2 diabetes mellitus without complications: Secondary | ICD-10-CM | POA: Insufficient documentation

## 2012-12-28 DIAGNOSIS — Z7982 Long term (current) use of aspirin: Secondary | ICD-10-CM | POA: Insufficient documentation

## 2012-12-28 DIAGNOSIS — Y939 Activity, unspecified: Secondary | ICD-10-CM | POA: Insufficient documentation

## 2012-12-28 DIAGNOSIS — I252 Old myocardial infarction: Secondary | ICD-10-CM | POA: Insufficient documentation

## 2012-12-28 DIAGNOSIS — Z872 Personal history of diseases of the skin and subcutaneous tissue: Secondary | ICD-10-CM | POA: Insufficient documentation

## 2012-12-28 DIAGNOSIS — I2510 Atherosclerotic heart disease of native coronary artery without angina pectoris: Secondary | ICD-10-CM | POA: Insufficient documentation

## 2012-12-28 DIAGNOSIS — M869 Osteomyelitis, unspecified: Secondary | ICD-10-CM | POA: Insufficient documentation

## 2012-12-28 DIAGNOSIS — S82302A Unspecified fracture of lower end of left tibia, initial encounter for closed fracture: Secondary | ICD-10-CM

## 2012-12-28 DIAGNOSIS — W06XXXA Fall from bed, initial encounter: Secondary | ICD-10-CM | POA: Insufficient documentation

## 2012-12-28 DIAGNOSIS — Z87891 Personal history of nicotine dependence: Secondary | ICD-10-CM | POA: Insufficient documentation

## 2012-12-28 DIAGNOSIS — Z794 Long term (current) use of insulin: Secondary | ICD-10-CM | POA: Insufficient documentation

## 2012-12-28 DIAGNOSIS — S82899A Other fracture of unspecified lower leg, initial encounter for closed fracture: Secondary | ICD-10-CM | POA: Insufficient documentation

## 2012-12-28 DIAGNOSIS — Z7902 Long term (current) use of antithrombotics/antiplatelets: Secondary | ICD-10-CM | POA: Insufficient documentation

## 2012-12-28 DIAGNOSIS — Z79899 Other long term (current) drug therapy: Secondary | ICD-10-CM | POA: Insufficient documentation

## 2012-12-28 DIAGNOSIS — I10 Essential (primary) hypertension: Secondary | ICD-10-CM | POA: Insufficient documentation

## 2012-12-28 DIAGNOSIS — Y9289 Other specified places as the place of occurrence of the external cause: Secondary | ICD-10-CM | POA: Insufficient documentation

## 2012-12-28 DIAGNOSIS — E785 Hyperlipidemia, unspecified: Secondary | ICD-10-CM | POA: Insufficient documentation

## 2012-12-28 DIAGNOSIS — I739 Peripheral vascular disease, unspecified: Secondary | ICD-10-CM | POA: Insufficient documentation

## 2012-12-28 LAB — GLUCOSE, CAPILLARY: Glucose-Capillary: 155 mg/dL — ABNORMAL HIGH (ref 70–99)

## 2012-12-28 MED ORDER — NAPROXEN 500 MG PO TABS: 500.0000 mg | ORAL_TABLET | Freq: Two times a day (BID) | ORAL | Status: DC

## 2012-12-28 MED ORDER — HYDROCODONE-ACETAMINOPHEN 5-325 MG PO TABS
2.0000 | ORAL_TABLET | Freq: Once | ORAL | Status: AC
  Administered 2012-12-28: 2 via ORAL
  Filled 2012-12-28: qty 2

## 2012-12-28 MED ORDER — HYDROCODONE-ACETAMINOPHEN 5-325 MG PO TABS: 2.0000 | ORAL_TABLET | ORAL | Status: DC | PRN

## 2012-12-28 NOTE — ED Provider Notes (Signed)
History  This chart was scribed for Sherri Roller, MD by Bennett Scrape, ED Scribe. This patient was seen in room [facility]06/[facility] and the patient's care was started at 9:50 PM.  CSN: [id number]  Arrival date & time 12/28/12  1911   First MD Initiated Contact with Patient 12/28/12 2150      Chief Complaint  Patient presents with  . Fall  . Ankle Pain    The history is provided by the patient. No language interpreter was used.    [name] is a 67 y.o. female with a h/o DM who presents to the Emergency Department complaining of 2 days of sudden onset, non-changing, constant left ankle pain after a fall out of bed. Pt states that she overdosed on her insulin and EMS was called by daughter who lives with the pt. She reports that EMS corrected her insulin on scene and she denies being transported to the ED for evaluation at the time. She states that since the incident she has been unable to ambulate secondary to pain. She has a prior left midfoot amputation due to diabetes and usually walks with a walker at home. She states that she was seen by PCP 4 days ago for healing wounds on both feet. She denies CP, SOB, visual disturbance and dizziness as associated symptoms. She also has a h/o HTN, HLD and CAD. She is a former smoker but denies alcohol use.  PCP is Dr. Edilia Bo.  Past Medical History  Diagnosis Date  . Diabetes mellitus   . Hypertension   . Hyperlipidemia   . PVD (peripheral vascular disease)   . Cellulitis of buttock, left 2010  . Arthritis   . GERD (gastroesophageal reflux disease)   . CAD (coronary artery disease)   . MI (myocardial infarction)     Past Surgical History  Procedure Laterality Date  . Femoral-popliteal bypass graft  07/2010  . Right common iliac stent  10/2009    Dr Durwin Nora ([place])  . Tonsillectomy    . S/p hysterecotmy      partial  . Foot amputation  2010    left  . Colonoscopy  10/26/2011    Procedure: COLONOSCOPY;  Surgeon: Arlyce Harman, MD;   Location: [facility];  Service: Endoscopy;  Laterality: N/A;  1:30  . Femoral-popliteal bypass graft  11/04/2012    Procedure: BYPASS GRAFT FEMORAL-POPLITEAL ARTERY;  Surgeon: Chuck Hint, MD;  Location: [facility] OR;  Service: Vascular;  Laterality: Left;  Redo Left Femoral Popliteal Bypass with PTFE    Family History  Problem Relation Age of Onset  . Brain cancer Father   . Cancer Father   . Diabetes Mother     many family members  . Alzheimer's disease Mother     History  Substance Use Topics  . Smoking status: Former Smoker -- 0.40 packs/day for 20 years    Types: Cigarettes    Start date: 11/04/2012  . Smokeless tobacco: Never Used  . Alcohol Use: No    No OB history provided.  Review of Systems  Eyes: Negative for visual disturbance.  Respiratory: Negative for cough and shortness of breath.   Cardiovascular: Negative for chest pain.  Musculoskeletal: Positive for arthralgias (left ankle pain).  Neurological: Negative for dizziness.    Allergies  Iohexol  Home Medications   Current Outpatient Rx  Name  Route  Sig  Dispense  Refill  . aspirin 325 MG EC tablet   Oral   Take 325 mg by  mouth daily.           . clopidogrel (PLAVIX) 75 MG tablet   Oral   Take 75 mg by mouth daily.           . hydrochlorothiazide (HYDRODIURIL) 25 MG tablet   Oral   Take 25 mg by mouth daily.         . insulin glargine (LANTUS) 100 UNIT/ML injection   Subcutaneous   Inject 10 Units into the skin at bedtime.         Marland Kitchen labetalol (NORMODYNE) 200 MG tablet   Oral   Take 200 mg by mouth 2 (two) times daily.           Marland Kitchen LORazepam (ATIVAN) 0.5 MG tablet   Oral   Take 0.5 mg by mouth at bedtime.          . Multiple Vitamin (MULITIVITAMIN WITH MINERALS) TABS   Oral   Take 1 tablet by mouth daily.         . simvastatin (ZOCOR) 40 MG tablet   Oral   Take 40 mg by mouth at bedtime.          Marland Kitchen albuterol (PROVENTIL HFA;VENTOLIN HFA) 108 (90 BASE) MCG/ACT  inhaler   Inhalation   Inhale 2 puffs into the lungs every 6 (six) hours as needed. For bronchitis and coughing         . alendronate (FOSAMAX) 70 MG tablet   Oral   Take 70 mg by mouth every 7 (seven) days. On Mondays         . HYDROcodone-acetaminophen (NORCO/VICODIN) 5-325 MG per tablet   Oral   Take 2 tablets by mouth every 4 (four) hours as needed for pain.   10 tablet   0   . naproxen (NAPROSYN) 500 MG tablet   Oral   Take 1 tablet (500 mg total) by mouth 2 (two) times daily with a meal.   30 tablet   0     Triage Vitals: BP 136/86  Pulse 89  Temp(Src) 98.1 F (36.7 C) (Oral)  Resp 16  Ht 5\' 3"  (1.6 m)  Wt 137 lb (62.143 kg)  BMI 24.27 kg/m2  SpO2 100%  Physical Exam  Nursing note and vitals reviewed. Constitutional: She is oriented to person, place, and time. She appears well-developed and well-nourished. No distress.  HENT:  Head: Normocephalic and atraumatic.  Mouth/Throat: Oropharynx is clear and moist.  Eyes: Conjunctivae and EOM are normal. Pupils are equal, round, and reactive to light.  Neck: Neck supple. No tracheal deviation present.  Cardiovascular: Normal rate and regular rhythm.   Murmur (slight systolic ) heard. Pulmonary/Chest: Effort normal and breath sounds normal. No respiratory distress.  Musculoskeletal:  No pain over the proximal fibula on the left, left midfoot amputations, asymmetric bilateral ankle swelling with left being greater than right   Neurological: She is alert and oriented to person, place, and time.  Skin: Skin is warm and dry.  Left foot with a healing wound to the lateral surface of the stump, pink granulation tissue, no foul smell or discharge, right foot has healthy pink tissue, superficial open wound over the right great toe, no foul smell or discharge, no edema  Psychiatric: She has a normal mood and affect. Her behavior is normal.    ED Course  Procedures (including critical care time)  DIAGNOSTIC  STUDIES: Oxygen Saturation is 100% on room air, normal by my interpretation.    COORDINATION OF CARE: 10:08 PM-Informed  pt of radiology and lab work results. Discussed discharge plan which includes crutches and pain medication with pt and pt agreed to plan. Also advised pt to follow up with orthopedist and pt agreed.  10:22 PM-Consult complete with Dr. Romeo Apple, ortho. Patient case explained and discussed. Dr. Romeo Apple agrees to see patient in office tomorrow for further treatment. Call ended at 10:23 PM.  Dg Ankle Complete Left  12/28/2012  *RADIOLOGY REPORT*  Clinical Data: Diabetic with prior transmetatarsal amputation, presenting with ulceration of the stump.  LEFT ANKLE COMPLETE - 3+ VIEW  Comparison: Left foot x-rays earlier same date and 11/06/2012.  Findings: Age indeterminate fractures involving the distal tibia and fibula, just above the malleoli.  No other fractures.  Ankle mortise intact with mild joint space narrowing.  Generalized osseous demineralization.  No evidence of osteomyelitis.  IMPRESSION: Age indeterminate fractures involving the distal tibia and fibula, just above the malleoli.  No evidence of osteomyelitis.   Original Report Authenticated By: Hulan Saas, M.D.    Dg Foot Complete Left  12/28/2012  *RADIOLOGY REPORT*  Clinical Data: Diabetic with prior transmetatarsal amputation, presenting with ulceration of the stomach.  LEFT FOOT - COMPLETE 3+ VIEW  Comparison: Left foot x-rays 11/06/2012.  Findings: Disuse osteopenia.  Osteolysis involving the distal aspect of the remaining third metatarsal, with sclerotic margins. No evidence of osteomyelitis elsewhere. No evidence of acute or subacute fracture or dislocation.  Extensive soft tissue swelling.  IMPRESSION: Likely chronic osteomyelitis involving the distal aspect of the remaining third metatarsal.  Disuse osteopenia.   Original Report Authenticated By: Hulan Saas, M.D.      1. Closed fracture of distal end of  fibula with tibia, left, initial encounter   2. Osteomyelitis       MDM  Findings were discussed with the patient as well as with Dr. Romeo Apple. He agrees to see the patient tomorrow, vital signs are stable, there is a hint of early osteomyelitis in the end of the third metatarsal. She had imaging in January, she saw Dr. Durwin Nora in the last week and is currently being followed very closely. Her wounds were clean and healing very well, she has no tenderness over the distal end of her stump, no fevers and can followup tomorrow with orthopedics. She has been given instructions to let them know about all of her results including the possible bone infection.  Splint placed, patient remained nonambulatory, stable for discharge  Medications  HYDROcodone-acetaminophen (NORCO/VICODIN) 5-325 MG per tablet 2 tablet (not administered)   New Prescriptions   HYDROCODONE-ACETAMINOPHEN (NORCO/VICODIN) 5-325 MG PER TABLET    Take 2 tablets by mouth every 4 (four) hours as needed for pain.   NAPROXEN (NAPROSYN) 500 MG TABLET    Take 1 tablet (500 mg total) by mouth 2 (two) times daily with a meal.     I personally performed the services described in this documentation, which was scribed in my presence. The recorded information has been reviewed and is accurate.          Sherri Roller, MD 12/28/12 [phone]

## 2012-12-28 NOTE — ED Notes (Signed)
Pt fell out of bed Friday morning due to low blood sugar. She has a knot to right side head. She is now c/o left ankle pain, states ankle is collapsing. Pt can't put any pressure on it nor is she able to walk.

## 2012-12-28 NOTE — ED Notes (Signed)
Pt has left ankle pain and swelling post fall that happened 2 days ago. Partial amputation to L foot noted as well done a year ago per pt. Healing wound noted to right foot.

## 2012-12-28 NOTE — ED Notes (Signed)
CNS intact post splint. Denies numbness or areas of pressure. Also applies sterile gauze soaked in sterile salene to would on right foot then wrapped in sterile gauze wrap. Per pts caregiver this is what they have been doing everyday per their MD order.

## 2012-12-29 ENCOUNTER — Ambulatory Visit (INDEPENDENT_AMBULATORY_CARE_PROVIDER_SITE_OTHER): Payer: PRIVATE HEALTH INSURANCE | Admitting: Orthopedic Surgery

## 2012-12-29 ENCOUNTER — Telehealth: Payer: Self-pay

## 2012-12-29 ENCOUNTER — Encounter: Payer: Self-pay | Admitting: Orthopedic Surgery

## 2012-12-29 VITALS — BP 126/56 | Ht 63.0 in | Wt 137.0 lb

## 2012-12-29 DIAGNOSIS — S82843A Displaced bimalleolar fracture of unspecified lower leg, initial encounter for closed fracture: Secondary | ICD-10-CM | POA: Insufficient documentation

## 2012-12-29 DIAGNOSIS — S82842A Displaced bimalleolar fracture of left lower leg, initial encounter for closed fracture: Secondary | ICD-10-CM

## 2012-12-29 MED ORDER — HYDROCODONE-ACETAMINOPHEN 7.5-325 MG PO TABS: 1.0000 | ORAL_TABLET | ORAL | Status: DC | PRN

## 2012-12-29 NOTE — Progress Notes (Signed)
Patient ID: Sherri Brooks, female   DOB: 12-25-1945, 67 y.o.   MRN: [id number] Chief Complaint  Patient presents with  . Ankle Pain    left ankle fracture d/t injury 12/28/12    Patient history pain left ankle. The patient was injured on March 14 she fell out of bed secondary to hypoglycemia. She injured her medial lateral malleolus is a closed bimalleolar fracture minimal displacement. This is however complicated by recent transmetatarsal amputation with an open wound on the lateral side  Her pain level is 8/10 it's constant she did reported nothing makes it worse or better she denied numbness tingling locking catching swelling or bruising she does have trouble weightbearing on the left side  She reported weight loss as her only review of systems all other systems were reviewed and normal   Past Medical History  Diagnosis Date  . Diabetes mellitus   . Hypertension   . Hyperlipidemia   . PVD (peripheral vascular disease)   . Cellulitis of buttock, left 2010  . Arthritis   . GERD (gastroesophageal reflux disease)   . CAD (coronary artery disease)   . MI (myocardial infarction)     General appearance is normal, the patient is alert and oriented x3 with normal mood and affect.  Ambulation is primarily wheelchair since the injury she can ambulate with a walker  Ankle is swollen and tender medial and lateral range of motion approximately 20 ankle joint stable motor exam normal she has a transmetatarsal amputation with a lateral wound 20 cm wide  It's noted that she has decreased dorsalis pedis pulses gross sensation is normal.  X-rays were done at the hospital she does have a medial and lateral malleolar fracture nondisplaced with an intact ankle mortise and osteopenia is noted there appears to be either cystic lesion or profile related x-ray finding nonpathologic cyst in the distal fibula  Ankle fracture, bimalleolar, closed, left, initial encounter - Plan: HYDROcodone-acetaminophen  (NORCO) 7.5-325 MG per tablet   Our plan is to redress the wound following shots as per Dr. Edilia Bo send him a letter  Ankle air cast weight-bear through her heel as needed for bathroom otherwise stay off the leg  Followup 3 weeks to check x-ray for any displacement.

## 2012-12-29 NOTE — Patient Instructions (Addendum)
Change dressing per Dr Quillian Quince instructions  Wear ankle brace x 6 weeks   Weight bear thru the heel with the walker to go to the bath room

## 2012-12-29 NOTE — Telephone Encounter (Signed)
Daughter called to make Dr. Edilia Bo aware of pt. falling out of bed, recently, as a result of a low blood sugar.  States pt. has a fractured left ankle; she was seen in the ER on 3/16.  States the incision of left leg surgical site is intact; no open area/drainage; no fevers/chills.  Pt. has appt. at the orthopaedic specialist, today, for evaluation of fractured ankle.  The ER MD advised pt. to call and report injury being on same side of bypass graft.  Daughter will call back if the Orthopaedic MD thinks pt. needs to see Dr. Edilia Bo sooner than the 2 week f/u on 01/07/13.

## 2013-01-06 ENCOUNTER — Encounter: Payer: Self-pay | Admitting: Vascular Surgery

## 2013-01-07 ENCOUNTER — Ambulatory Visit: Payer: PRIVATE HEALTH INSURANCE | Admitting: Vascular Surgery

## 2013-01-20 ENCOUNTER — Ambulatory Visit (INDEPENDENT_AMBULATORY_CARE_PROVIDER_SITE_OTHER): Payer: PRIVATE HEALTH INSURANCE | Admitting: Orthopedic Surgery

## 2013-01-20 ENCOUNTER — Encounter: Payer: Self-pay | Admitting: Orthopedic Surgery

## 2013-01-20 ENCOUNTER — Ambulatory Visit (INDEPENDENT_AMBULATORY_CARE_PROVIDER_SITE_OTHER): Payer: PRIVATE HEALTH INSURANCE

## 2013-01-20 VITALS — BP 150/64 | Ht 63.0 in | Wt 137.0 lb

## 2013-01-20 DIAGNOSIS — S82892D Other fracture of left lower leg, subsequent encounter for closed fracture with routine healing: Secondary | ICD-10-CM

## 2013-01-20 DIAGNOSIS — S8290XD Unspecified fracture of unspecified lower leg, subsequent encounter for closed fracture with routine healing: Secondary | ICD-10-CM

## 2013-01-20 NOTE — Patient Instructions (Signed)
Please refer patient to a foot and ankle specialist for  Displacing fracture with underlying femoropopliteal bypass graft and foot ulcer and history of diabetes status post transmetatarsal amputation

## 2013-01-20 NOTE — Progress Notes (Signed)
Patient ID: Sherri Brooks, female   DOB: Sep 10, 1946, 67 y.o.   MRN: [id number] Chief Complaint  Patient presents with  . Follow-up    3 week recheck left ankle fracture DOI 12/28/12    BP 150/64  Ht 5\' 3"  (1.6 m)  Wt 137 lb (62.143 kg)  BMI 24.27 kg/m2   History status post femoropopliteal bypass status post transmetatarsal amputation status post lateral plantar ulceration  Fracture of the left ankle with bimalleolar fracture was treated with an air cast nonweightbearing  Fracture has displaced on x-ray  She is getting over pull of the tibialis anterior and the foot is being pulled into supination and varus  I placed her in a splint with a sugar tong into the new x-ray, improved position but not anatomic restoration   I recommend that she see a foot and ankle specialist for possible internal fixation and tibialis anterior split transfer with vascular consultation  Referral made to Dr Victorino Dike

## 2013-01-23 ENCOUNTER — Encounter (HOSPITAL_BASED_OUTPATIENT_CLINIC_OR_DEPARTMENT_OTHER): Payer: Self-pay | Admitting: *Deleted

## 2013-01-23 NOTE — Progress Notes (Signed)
Pt has been unstable diabetic in past-had her third Fem-pop BPG 1/14-diabetes more controlled now-staying with daughter-had an open wound front part of lt foot where toes were amputated-goes to wound clinic-daughter doing drg chg- Will need istat- Did discuss pt with dr Gypsy Balsam if appropriate for DSC-he said he would talk to dr Hewit Pt does sound more stable over phone than last notes Can walk with walker-mostly in W/C

## 2013-01-26 ENCOUNTER — Telehealth: Payer: Self-pay | Admitting: *Deleted

## 2013-01-26 NOTE — Progress Notes (Signed)
Talked woth pt-blood sugars 100-130 in am- To be NPO P 3am-12 hr preop-can have apple juice up to 7am-8 hr preop-arrive 1245pm or earlier if nec Dr Earlie Counts has not had her stop plavix or asa-told to call office this am before taking-they were to get clearance from Dr Edilia Bo to stop due to her severe PVD. Bring all med-only take HTN meds and maybe ativan in am. Daughter to come and taker her home post op and stay with her.

## 2013-01-26 NOTE — Telephone Encounter (Signed)
Refill request for Norco 7.5/325. [employer]

## 2013-01-26 NOTE — Telephone Encounter (Signed)
refill 

## 2013-01-27 ENCOUNTER — Encounter (HOSPITAL_BASED_OUTPATIENT_CLINIC_OR_DEPARTMENT_OTHER): Payer: Self-pay | Admitting: *Deleted

## 2013-01-27 ENCOUNTER — Encounter (HOSPITAL_BASED_OUTPATIENT_CLINIC_OR_DEPARTMENT_OTHER)
Admission: RE | Disposition: A | Discharge status: Home / Self Care | Payer: Self-pay | Source: Ambulatory Visit | Attending: Orthopedic Surgery

## 2013-01-27 ENCOUNTER — Ambulatory Visit (HOSPITAL_BASED_OUTPATIENT_CLINIC_OR_DEPARTMENT_OTHER)
Admission: RE | Admit: 2013-01-27 | Discharge: 2013-01-27 | Disposition: A | Discharge status: Home / Self Care | Payer: PRIVATE HEALTH INSURANCE | Source: Ambulatory Visit | Attending: Orthopedic Surgery | Admitting: Orthopedic Surgery

## 2013-01-27 ENCOUNTER — Encounter (HOSPITAL_BASED_OUTPATIENT_CLINIC_OR_DEPARTMENT_OTHER): Payer: Self-pay | Admitting: Anesthesiology

## 2013-01-27 ENCOUNTER — Ambulatory Visit (HOSPITAL_BASED_OUTPATIENT_CLINIC_OR_DEPARTMENT_OTHER): Payer: PRIVATE HEALTH INSURANCE | Admitting: Anesthesiology

## 2013-01-27 DIAGNOSIS — I739 Peripheral vascular disease, unspecified: Secondary | ICD-10-CM | POA: Insufficient documentation

## 2013-01-27 DIAGNOSIS — I2510 Atherosclerotic heart disease of native coronary artery without angina pectoris: Secondary | ICD-10-CM | POA: Insufficient documentation

## 2013-01-27 DIAGNOSIS — S82842P Displaced bimalleolar fracture of left lower leg, subsequent encounter for closed fracture with malunion: Secondary | ICD-10-CM

## 2013-01-27 DIAGNOSIS — S82843A Displaced bimalleolar fracture of unspecified lower leg, initial encounter for closed fracture: Secondary | ICD-10-CM | POA: Insufficient documentation

## 2013-01-27 DIAGNOSIS — Z7982 Long term (current) use of aspirin: Secondary | ICD-10-CM | POA: Insufficient documentation

## 2013-01-27 DIAGNOSIS — Z91041 Radiographic dye allergy status: Secondary | ICD-10-CM | POA: Insufficient documentation

## 2013-01-27 DIAGNOSIS — Z794 Long term (current) use of insulin: Secondary | ICD-10-CM | POA: Insufficient documentation

## 2013-01-27 DIAGNOSIS — Z87891 Personal history of nicotine dependence: Secondary | ICD-10-CM | POA: Insufficient documentation

## 2013-01-27 DIAGNOSIS — K219 Gastro-esophageal reflux disease without esophagitis: Secondary | ICD-10-CM | POA: Insufficient documentation

## 2013-01-27 DIAGNOSIS — Z7902 Long term (current) use of antithrombotics/antiplatelets: Secondary | ICD-10-CM | POA: Insufficient documentation

## 2013-01-27 DIAGNOSIS — M129 Arthropathy, unspecified: Secondary | ICD-10-CM | POA: Insufficient documentation

## 2013-01-27 DIAGNOSIS — S98919A Complete traumatic amputation of unspecified foot, level unspecified, initial encounter: Secondary | ICD-10-CM | POA: Insufficient documentation

## 2013-01-27 DIAGNOSIS — I10 Essential (primary) hypertension: Secondary | ICD-10-CM | POA: Insufficient documentation

## 2013-01-27 DIAGNOSIS — L97509 Non-pressure chronic ulcer of other part of unspecified foot with unspecified severity: Secondary | ICD-10-CM | POA: Insufficient documentation

## 2013-01-27 DIAGNOSIS — W06XXXA Fall from bed, initial encounter: Secondary | ICD-10-CM | POA: Insufficient documentation

## 2013-01-27 DIAGNOSIS — I252 Old myocardial infarction: Secondary | ICD-10-CM | POA: Insufficient documentation

## 2013-01-27 DIAGNOSIS — E119 Type 2 diabetes mellitus without complications: Secondary | ICD-10-CM | POA: Insufficient documentation

## 2013-01-27 DIAGNOSIS — Z79899 Other long term (current) drug therapy: Secondary | ICD-10-CM | POA: Insufficient documentation

## 2013-01-27 DIAGNOSIS — E785 Hyperlipidemia, unspecified: Secondary | ICD-10-CM | POA: Insufficient documentation

## 2013-01-27 HISTORY — PX: ORIF ANKLE FRACTURE: SHX5408

## 2013-01-27 HISTORY — DX: Presence of spectacles and contact lenses: Z97.3

## 2013-01-27 LAB — POCT I-STAT, CHEM 8
BUN: 19 mg/dL (ref 6–23)
Calcium, Ion: 1.07 mmol/L — ABNORMAL LOW (ref 1.13–1.30)
Chloride: 107 mEq/L (ref 96–112)
Glucose, Bld: 135 mg/dL — ABNORMAL HIGH (ref 70–99)
HCT: 37 % (ref 36.0–46.0)
TCO2: 26 mmol/L (ref 0–100)

## 2013-01-27 LAB — GLUCOSE, CAPILLARY: Glucose-Capillary: 100 mg/dL — ABNORMAL HIGH (ref 70–99)

## 2013-01-27 SURGERY — OPEN REDUCTION INTERNAL FIXATION (ORIF) ANKLE FRACTURE
Anesthesia: General | Site: Ankle | Laterality: Left | Wound class: Clean

## 2013-01-27 MED ORDER — FENTANYL CITRATE 0.05 MG/ML IJ SOLN
INTRAMUSCULAR | Status: DC | PRN
  Administered 2013-01-27 (×3): 50 ug via INTRAVENOUS

## 2013-01-27 MED ORDER — MIDAZOLAM HCL 5 MG/5ML IJ SOLN
INTRAMUSCULAR | Status: DC | PRN
  Administered 2013-01-27: 0.5 mg via INTRAVENOUS

## 2013-01-27 MED ORDER — EPHEDRINE SULFATE 50 MG/ML IJ SOLN
INTRAMUSCULAR | Status: DC | PRN
  Administered 2013-01-27: 5 mg via INTRAVENOUS

## 2013-01-27 MED ORDER — LACTATED RINGERS IV SOLN
INTRAVENOUS | Status: DC
  Administered 2013-01-27 (×2): via INTRAVENOUS

## 2013-01-27 MED ORDER — OXYCODONE HCL 5 MG/5ML PO SOLN: 5.0000 mg | Freq: Once | ORAL | Status: AC | PRN

## 2013-01-27 MED ORDER — HYDROCODONE-ACETAMINOPHEN 5-325 MG PO TABS: 1.0000 | ORAL_TABLET | Freq: Four times a day (QID) | ORAL | Status: DC | PRN

## 2013-01-27 MED ORDER — PROMETHAZINE HCL 25 MG/ML IJ SOLN: 6.2500 mg | INTRAMUSCULAR | Status: DC | PRN

## 2013-01-27 MED ORDER — OXYCODONE HCL 5 MG PO TABS
5.0000 mg | ORAL_TABLET | Freq: Once | ORAL | Status: AC | PRN
  Administered 2013-01-27: 5 mg via ORAL

## 2013-01-27 MED ORDER — CEFAZOLIN SODIUM-DEXTROSE 2-3 GM-% IV SOLR
2.0000 g | Freq: Once | INTRAVENOUS | Status: AC
  Administered 2013-01-27: 2 g via INTRAVENOUS

## 2013-01-27 MED ORDER — ACETAMINOPHEN 10 MG/ML IV SOLN
1000.0000 mg | Freq: Once | INTRAVENOUS | Status: AC
  Administered 2013-01-27: 1000 mg via INTRAVENOUS

## 2013-01-27 MED ORDER — ONDANSETRON HCL 4 MG/2ML IJ SOLN
INTRAMUSCULAR | Status: DC | PRN
  Administered 2013-01-27: 4 mg via INTRAVENOUS

## 2013-01-27 MED ORDER — MIDAZOLAM HCL 2 MG/2ML IJ SOLN: 0.5000 mg | Freq: Once | INTRAMUSCULAR | Status: DC | PRN

## 2013-01-27 MED ORDER — HYDROMORPHONE HCL PF 1 MG/ML IJ SOLN
0.2500 mg | INTRAMUSCULAR | Status: DC | PRN
  Administered 2013-01-27 (×2): 0.25 mg via INTRAVENOUS

## 2013-01-27 MED ORDER — MEPERIDINE HCL 25 MG/ML IJ SOLN: 6.2500 mg | INTRAMUSCULAR | Status: DC | PRN

## 2013-01-27 MED ORDER — PROPOFOL 10 MG/ML IV BOLUS
INTRAVENOUS | Status: DC | PRN
  Administered 2013-01-27: 100 mg via INTRAVENOUS

## 2013-01-27 SURGICAL SUPPLY — 70 items
BANDAGE ESMARK 6X9 LF (GAUZE/BANDAGES/DRESSINGS) ×1 IMPLANT
BIT DRILL 2.9 CANN QC NONSTRL (BIT) ×2 IMPLANT
BLADE SURG 15 STRL LF DISP TIS (BLADE) ×2 IMPLANT
BLADE SURG 15 STRL SS (BLADE) ×2
BNDG COHESIVE 4X5 TAN STRL (GAUZE/BANDAGES/DRESSINGS) ×2 IMPLANT
BNDG COHESIVE 6X5 TAN STRL LF (GAUZE/BANDAGES/DRESSINGS) ×2 IMPLANT
BNDG ESMARK 4X9 LF (GAUZE/BANDAGES/DRESSINGS) IMPLANT
BNDG ESMARK 6X9 LF (GAUZE/BANDAGES/DRESSINGS) ×2
CANISTER SUCTION 1200CC (MISCELLANEOUS) ×2 IMPLANT
CHLORAPREP W/TINT 26ML (MISCELLANEOUS) ×2 IMPLANT
CLOTH BEACON ORANGE TIMEOUT ST (SAFETY) ×2 IMPLANT
COVER TABLE BACK 60X90 (DRAPES) ×2 IMPLANT
CUFF TOURNIQUET SINGLE 34IN LL (TOURNIQUET CUFF) IMPLANT
DECANTER SPIKE VIAL GLASS SM (MISCELLANEOUS) IMPLANT
DRAPE C-ARM 42X72 X-RAY (DRAPES) IMPLANT
DRAPE C-ARMOR (DRAPES) IMPLANT
DRAPE EXTREMITY T 121X128X90 (DRAPE) ×2 IMPLANT
DRAPE INCISE IOBAN 66X45 STRL (DRAPES) IMPLANT
DRAPE U-SHAPE 47X51 STRL (DRAPES) ×2 IMPLANT
DRAPE U-SHAPE 76X120 STRL (DRAPES) IMPLANT
DRSG ADAPTIC 3X8 NADH LF (GAUZE/BANDAGES/DRESSINGS) IMPLANT
DRSG EMULSION OIL 3X3 NADH (GAUZE/BANDAGES/DRESSINGS) ×2 IMPLANT
DRSG PAD ABDOMINAL 8X10 ST (GAUZE/BANDAGES/DRESSINGS) ×4 IMPLANT
ELECT REM PT RETURN 9FT ADLT (ELECTROSURGICAL) ×2
ELECTRODE REM PT RTRN 9FT ADLT (ELECTROSURGICAL) ×1 IMPLANT
GLOVE BIO SURGEON STRL SZ8 (GLOVE) ×2 IMPLANT
GLOVE BIOGEL PI IND STRL 7.0 (GLOVE) ×1 IMPLANT
GLOVE BIOGEL PI IND STRL 8 (GLOVE) ×1 IMPLANT
GLOVE BIOGEL PI INDICATOR 7.0 (GLOVE) ×1
GLOVE BIOGEL PI INDICATOR 8 (GLOVE) ×1
GLOVE ECLIPSE 6.5 STRL STRAW (GLOVE) ×2 IMPLANT
GLOVE EXAM NITRILE MD LF STRL (GLOVE) ×2 IMPLANT
GOWN PREVENTION PLUS XLARGE (GOWN DISPOSABLE) ×2 IMPLANT
GOWN PREVENTION PLUS XXLARGE (GOWN DISPOSABLE) ×2 IMPLANT
K-WIRE ACE 1.6X6 (WIRE) ×4
KWIRE ACE 1.6X6 (WIRE) ×2 IMPLANT
NAIL FLEXIBLE WIN 2.5MM (Nail) ×2 IMPLANT
NEEDLE HYPO 22GX1.5 SAFETY (NEEDLE) IMPLANT
NS IRRIG 1000ML POUR BTL (IV SOLUTION) ×2 IMPLANT
PACK BASIN DAY SURGERY FS (CUSTOM PROCEDURE TRAY) ×2 IMPLANT
PAD CAST 4YDX4 CTTN HI CHSV (CAST SUPPLIES) ×2 IMPLANT
PADDING CAST ABS 4INX4YD NS (CAST SUPPLIES)
PADDING CAST ABS COTTON 4X4 ST (CAST SUPPLIES) IMPLANT
PADDING CAST COTTON 4X4 STRL (CAST SUPPLIES) ×2
PADDING CAST COTTON 6X4 STRL (CAST SUPPLIES) ×2 IMPLANT
PENCIL BUTTON HOLSTER BLD 10FT (ELECTRODE) ×2 IMPLANT
SANITIZER HAND PURELL 535ML FO (MISCELLANEOUS) ×2 IMPLANT
SCREW ACE CAN 4.0 40M (Screw) ×2 IMPLANT
SCREW CORTICAL 3.5MM 70MM (Screw) ×2 IMPLANT
SHEET MEDIUM DRAPE 40X70 STRL (DRAPES) ×2 IMPLANT
SLEEVE SCD COMPRESS KNEE MED (MISCELLANEOUS) ×2 IMPLANT
SPLINT FAST PLASTER 5X30 (CAST SUPPLIES) ×18
SPLINT PLASTER CAST FAST 5X30 (CAST SUPPLIES) ×18 IMPLANT
SPONGE GAUZE 4X4 12PLY (GAUZE/BANDAGES/DRESSINGS) ×2 IMPLANT
SPONGE LAP 18X18 X RAY DECT (DISPOSABLE) ×2 IMPLANT
STOCKINETTE 6  STRL (DRAPES) ×1
STOCKINETTE 6 STRL (DRAPES) ×1 IMPLANT
SUCTION FRAZIER TIP 10 FR DISP (SUCTIONS) ×2 IMPLANT
SUT ETHILON 3 0 PS 1 (SUTURE) ×2 IMPLANT
SUT FIBERWIRE #2 38 T-5 BLUE (SUTURE)
SUT MNCRL AB 3-0 PS2 18 (SUTURE) IMPLANT
SUT VIC AB 0 SH 27 (SUTURE) IMPLANT
SUT VIC AB 2-0 SH 27 (SUTURE)
SUT VIC AB 2-0 SH 27XBRD (SUTURE) IMPLANT
SUT VICRYL 4-0 PS2 18IN ABS (SUTURE) IMPLANT
SUTURE FIBERWR #2 38 T-5 BLUE (SUTURE) IMPLANT
SYR BULB 3OZ (MISCELLANEOUS) ×2 IMPLANT
SYR CONTROL 10ML LL (SYRINGE) IMPLANT
TUBE CONNECTING 20X1/4 (TUBING) ×2 IMPLANT
UNDERPAD 30X30 INCONTINENT (UNDERPADS AND DIAPERS) ×2 IMPLANT

## 2013-01-27 NOTE — Anesthesia Preprocedure Evaluation (Addendum)
Anesthesia Evaluation  Patient identified by MRN, date of birth, ID band Patient awake    Reviewed: Allergy & Precautions, H&P , NPO status , Patient's Chart, lab work & pertinent test results, reviewed documented beta blocker date and time   Airway Mallampati: II TM Distance: >3 FB Neck ROM: Full    Dental  (+) Edentulous Upper, Partial Lower and Dental Advisory Given   Pulmonary COPD COPD inhaler, former smoker,  breath sounds clear to auscultation  Pulmonary exam normal       Cardiovascular hypertension, Pt. on medications and Pt. on home beta blockers + CAD, + Past MI and + Peripheral Vascular Disease (s/ B fem-pop, foot/toe amp,) Rhythm:Regular Rate:Normal  '11 ECHO: normal LVF, EF 70%, valves OK   Neuro/Psych negative neurological ROS     GI/Hepatic Neg liver ROS, GERD-  Medicated and Controlled,  Endo/Other  diabetes (glu 135), Well Controlled, Type 2  Renal/GU      Musculoskeletal   Abdominal   Peds  Hematology   Anesthesia Other Findings   Reproductive/Obstetrics                          Anesthesia Physical Anesthesia Plan  ASA: III  Anesthesia Plan: General   Post-op Pain Management:    Induction: Intravenous  Airway Management Planned: LMA  Additional Equipment:   Intra-op Plan:   Post-operative Plan:   Informed Consent: I have reviewed the patients History and Physical, chart, labs and discussed the procedure including the risks, benefits and alternatives for the proposed anesthesia with the patient or authorized representative who has indicated his/her understanding and acceptance.   Dental advisory given  Plan Discussed with: CRNA and Surgeon  Anesthesia Plan Comments: (Plan routine monitors, GA- LMA OK No popliteal block: s/p fem-pop, on plavix and ASA)        Anesthesia Quick Evaluation

## 2013-01-27 NOTE — Brief Op Note (Signed)
01/27/2013  4:23 PM  PATIENT:  Armando Gang  67 y.o. female  PRE-OPERATIVE DIAGNOSIS:  LEFT ANKLE BIMALLEOLAR FRACTURE  POST-OPERATIVE DIAGNOSIS:  Left Ankle Bimalleolar Fracture  Procedure(s): 1.  ORIF left ankle bimalleolar fracture 2.  2 view radiographs of left ankle  SURGEON:  Toni Arthurs, MD  ASSISTANT: n/a  ANESTHESIA:   General  EBL:  minimal   TOURNIQUET:  none  COMPLICATIONS:  None apparent  DISPOSITION:  Extubated, awake and stable to recovery.  DICTATION ID:  [id number]

## 2013-01-27 NOTE — Anesthesia Procedure Notes (Signed)
Procedure Name: LMA Insertion Date/Time: 01/27/2013 3:26 PM Performed by: Verlan Friends Pre-anesthesia Checklist: Patient identified, Emergency Drugs available, Suction available, Patient being monitored and Timeout performed Patient Re-evaluated:Patient Re-evaluated prior to inductionOxygen Delivery Method: Circle System Utilized Preoxygenation: Pre-oxygenation with 100% oxygen Intubation Type: IV induction Ventilation: Mask ventilation without difficulty LMA: LMA inserted LMA Size: 4.0 Number of attempts: 1 Airway Equipment and Method: bite block Placement Confirmation: positive ETCO2 Tube secured with: Tape Dental Injury: Teeth and Oropharynx as per pre-operative assessment  Comments: Maxilla edentulous and ant. Mandibular teeth noted to be in poor condition.  Untouched during insertion of LMA

## 2013-01-27 NOTE — Transfer of Care (Signed)
Immediate Anesthesia Transfer of Care Note  Patient: Sherri Brooks  Procedure(s) Performed: Procedure(s): OPEN REDUCTION INTERNAL FIXATION (ORIF) ANKLE FRACTURE (Left)  Patient Location: PACU  Anesthesia Type:General  Level of Consciousness: awake, alert , oriented and patient cooperative  Airway & Oxygen Therapy: Patient Spontanous Breathing and Patient connected to face mask oxygen  Post-op Assessment: Report given to PACU RN and Post -op Vital signs reviewed and stable  Post vital signs: Reviewed and stable  Complications: No apparent anesthesia complications

## 2013-01-27 NOTE — Anesthesia Postprocedure Evaluation (Signed)
Anesthesia Post Note  Patient: Sherri Brooks  Procedure(s) Performed: Procedure(s) (LRB): OPEN REDUCTION INTERNAL FIXATION (ORIF) ANKLE FRACTURE (Left)  Anesthesia type: General  Patient location: PACU  Post pain: Pain level controlled and Adequate analgesia  Post assessment: Post-op Vital signs reviewed, Patient's Cardiovascular Status Stable, Respiratory Function Stable, Patent Airway and Pain level controlled  Last Vitals:  Filed Vitals:   01/27/13 1700  BP: 175/63  Pulse: 64  Temp:   Resp: 14    Post vital signs: Reviewed and stable  Level of consciousness: awake, alert  and oriented  Complications: No apparent anesthesia complications

## 2013-01-27 NOTE — H&P (Signed)
Sherri Brooks is an 67 y.o. female.   Chief Complaint: left ankle fracture HPI: 67 y/o female with PMH of diabetes and PVD s/p L transmet amputation fractured her left ankle over a month ago.  She has collapsed into varus with closed treatment putting her lateral skin at considerable risk for ulceration and ultimately BKA.  Per Dr. Edilia Bo, she has a extremely poor blood flow to the LLE with a forefoot ulcer and is not a candidate for incisions about the ankle.  She presents now for reduction of her displaced bimalleolar ankle fracture and limited internal fixation.  Past Medical History  Diagnosis Date  . Diabetes mellitus   . Hypertension   . Hyperlipidemia   . PVD (peripheral vascular disease)   . Cellulitis of buttock, left 2010  . Arthritis   . GERD (gastroesophageal reflux disease)   . CAD (coronary artery disease)   . MI (myocardial infarction)   . Wears glasses     Past Surgical History  Procedure Laterality Date  . Femoral-popliteal bypass graft  07/2010    pt has had 3 fem-pop bpg  . Right common iliac stent  10/2009    Dr Durwin Nora ([place])  . Tonsillectomy    . S/p hysterecotmy      partial  . Foot amputation  2010    left  . Colonoscopy  10/26/2011    Procedure: COLONOSCOPY;  Surgeon: Arlyce Harman, MD;  [facility];  Service: Endoscopy;  Laterality: N/A;  1:30  . Femoral-popliteal bypass graft  11/04/2012    Procedure: BYPASS GRAFT FEMORAL-POPLITEAL ARTERY;  Surgeon: Chuck Hint, MD;  Location: [facility] OR;  Service: Vascular;  Laterality: Left;  Redo Left Femoral Popliteal Bypass with PTFE    Family History  Problem Relation Age of Onset  . Brain cancer Father   . Cancer Father   . Diabetes Mother     many family members  . Alzheimer's disease Mother   . Heart disease     Social History:  reports that she has quit smoking. Her smoking use included Cigarettes. She started smoking about 2 months ago. She has a 8 pack-year smoking history. She has  never used smokeless tobacco. She reports that she does not drink alcohol or use illicit drugs.  Allergies:  Allergies  Allergen Reactions  . Iohexol      Code: HIVES, Desc: PT STATES SHE EXPERIENCED ITCHING W/ IV DYE IN PAST-ARS 01/21/10, Onset Date: 16109604     Medications Prior to Admission  Medication Sig Dispense Refill  . albuterol (PROVENTIL HFA;VENTOLIN HFA) 108 (90 BASE) MCG/ACT inhaler Inhale 2 puffs into the lungs every 6 (six) hours as needed. For bronchitis and coughing      . alendronate (FOSAMAX) 70 MG tablet Take 70 mg by mouth every 7 (seven) days. On Mondays      . aspirin 325 MG EC tablet Take 325 mg by mouth daily.        . clopidogrel (PLAVIX) 75 MG tablet Take 75 mg by mouth daily.        . hydrochlorothiazide (HYDRODIURIL) 25 MG tablet Take 25 mg by mouth daily.      . insulin glargine (LANTUS) 100 UNIT/ML injection Inject 10 Units into the skin at bedtime.      Marland Kitchen labetalol (NORMODYNE) 200 MG tablet Take 200 mg by mouth 2 (two) times daily.        Marland Kitchen LORazepam (ATIVAN) 0.5 MG tablet Take 0.5 mg by mouth at  bedtime.       . Multiple Vitamin (MULITIVITAMIN WITH MINERALS) TABS Take 1 tablet by mouth daily.      . naproxen (NAPROSYN) 500 MG tablet Take 1 tablet (500 mg total) by mouth 2 (two) times daily with a meal.  30 tablet  0  . simvastatin (ZOCOR) 40 MG tablet Take 40 mg by mouth at bedtime.         Results for orders placed during the hospital encounter of 02/12/13 (from the past 48 hour(s))  POCT I-STAT, CHEM 8     Status: Abnormal   Collection Time    2013/02/12  1:44 PM      Result Value Range   Sodium 140  135 - 145 mEq/L   Potassium 3.8  3.5 - 5.1 mEq/L   Chloride 107  96 - 112 mEq/L   BUN 19  6 - 23 mg/dL   Creatinine, Ser 4.09  0.50 - 1.10 mg/dL   Glucose, Bld 811 (*) 70 - 99 mg/dL   Calcium, Ion 9.14 (*) 1.13 - 1.30 mmol/L   TCO2 26  0 - 100 mmol/L   Hemoglobin 12.6  12.0 - 15.0 g/dL   HCT 78.2  95.6 - 21.3 %   No results found.  ROS  No  recent f/c/n/v/w tloss.  Ulcer as above.  Blood pressure 176/69, pulse 73, temperature 97.8 F (36.6 C), temperature source Oral, resp. rate 16, height 5\' 3"  (1.6 m), weight 61.236 kg (135 lb), SpO2 100.00%. Physical Exam  Elderly thin woman in nad appearing older than her stated age.  A and O.  EOMI.  Resp unlabored.  Mood and affect normal.  L ankle with intact skin and varus malalignment.  Skin intact about the ankle.  Sens to LT diminshed at the foot and ankle.  5/5 strength in PF and DF of the ankle.  No palpable pulses at the foot.  No lymphadenopathy.  Foot is s/p trans met amp.  Skin is intact except a  2 cm ulcer distally and laterally.  Assessment/Plan L bimal displaced ankle fracture in a patient with diabetes and PVD s/p amputation of teh forefoot.  To OR for reduction and limited internal fixation.  I believe leaving her ankle in varus with undoubtedly lead to lateral ulceration and eventual amputation.  The alternative is reduction and internal fixation with also has risk of skin breakdown and eventual amputation.  The risks and benefits of the alternative treatment options have been discussed in detail.  The patient wishes to proceed with surgery and specifically understands risks of bleeding, infection, nerve damage, blood clots, need for additional surgery, amputation and death.   Toni Arthurs 12-Feb-2013, 3:02 PM

## 2013-01-27 NOTE — Telephone Encounter (Signed)
Patient having surgery today by Dr. Victorino Dike. Did not refill Hydrocodone.

## 2013-01-28 NOTE — Op Note (Signed)
Sherri Brooks, Sherri Brooks NO.:  [id number]  MEDICAL RECORD NO.:  [id number]  LOCATION:                                 FACILITY:  PHYSICIAN:  Toni Arthurs, MD        DATE OF BIRTH:  May 30, 1946  DATE OF PROCEDURE:  01/27/2013 DATE OF DISCHARGE:                              OPERATIVE REPORT   PREOPERATIVE DIAGNOSIS:  Left ankle displaced bimalleolar fracture.  POSTOPERATIVE DIAGNOSIS:  Left ankle displaced bimalleolar fracture.  PROCEDURE: 1. Open reduction and internal fixation of left ankle bimalleolar     fracture. 2. Two-view radiographs of the left ankle.  SURGEON:  Toni Arthurs, MD  ANESTHESIA:  General.  ESTIMATED BLOOD LOSS:  Minimal.  TOURNIQUET TIME:  Zero.  COMPLICATIONS:  None apparent.  DISPOSITION:  Extubated awake and stable to recovery.  INDICATIONS FOR PROCEDURE:  The patient is a 67 year old woman with past medical history significant for diabetes and peripheral vascular disease.  She is status post left foot transmetatarsal amputation and has a healing ulcer at the distal lateral aspect of the remaining forefoot.  She fell out of bed over a month ago, fracturing her left ankle.  An attempt was made to treat her in closed fashion by Dr. Romeo Brooks in [place].  This attempt failed when she collapsed into significant varus angulation at the ankle joint.  The fractures are now displaced and her skin at the lateral malleolus is at risk given her neuropathy and poor blood supply.  She presents now for open reduction and internal fixation of her ankle.  She understands the risks and benefits, the alternative treatment options and elects surgical treatment.  She specifically understands risks of bleeding, infection, nerve damage, blood clots, delayed healing, nonunion, amputation, and death.  PROCEDURE IN DETAIL:  After preoperative consent was obtained and the correct operative site was identified, the patient was brought to the operating  room and placed supine on the operating table.  General anesthesia was induced.  Preoperative antibiotics were administered. Surgical time-out was taken.  Left lower extremity was prepped and draped in standard sterile fashion.  The tourniquet was not used for this case.  The ankle was noted to be in significant varus angulation. The angulation was corrected with a closed reduction maneuver.  AP and lateral fluoroscopic images showed that the lateral malleolus was adequately reduced.  Stab incision was made distal to the lateral malleolus.  A 2.5 mm flexible titanium nail was inserted through the tip of the malleolus and across the fracture site into the fibular shaft. AP and lateral fluoroscopic images confirmed appropriate position of this pin.  The pin was trimmed and impacted into the distal end of the fibula.  At this point, a closed reduction attempt was made with the medial malleolus by pressing on the displaced fragment.  This was unsuccessful.  A stab incision was made just to the apex of the fracture site proximally.  A quarter-inch Key elevator was placed into the fracture site and was used to lever it down into an acceptable position. It was not possible to get it completely reduced, particularly at the posterior medial corner.  A K-wire was inserted  through the tip of the medial malleolus anteriorly and advanced across the fracture site. Manual pressure was applied posteromedially and a 2nd K-wire was inserted.  AP and lateral fluoroscopic images confirmed appropriate position of both of these wires.  A stab incision was made at the anterior K-wire.  A 4 mm x 40 mm partially-threaded cannulated screw was inserted.  It was noted to compress the fracture site appropriately.  In an attempt to buttress this fracture and keep it from collapsing back into varus, a fully-threaded 70 mm 3.5 solid screw was then inserted after pre-drilling the posterior wire.  This was advanced until  it touched the cortex but not compressed.  AP and lateral fluoroscopic images showed adequate alignment and appropriate position and length of all hardware.  The 4 stab incisions were irrigated and closed with horizontal mattress sutures of 3-0 nylon.  Sterile dressings were applied followed by a well-padded short-leg splint.  The patient was awakened by anesthesia and transported to recovery room in stable condition.  FOLLOWUP PLAN:  The patient will be nonweightbearing on the left lower extremity.  She will follow up with me in 2 weeks for suture removal and conversion to a cast.  She will continue with dressing changes per Dr. Darletta Brooks instructions.  RADIOGRAPHS:  AP and lateral, simulated weightbearing x-rays of the left ankle were obtained intraoperatively.  These showed interval reduction of the lateral malleolus with a intramedullary pin across the fracture site.  The medial malleolus fracture has been reduced to an acceptable position with 2 screws across the fracture site.  Overall neutral alignment has been restored to the ankle joint.     Toni Arthurs, MD     JH/MEDQ  D:  01/27/2013  T:  01/28/2013  Job:  [id number]

## 2013-01-29 ENCOUNTER — Encounter (HOSPITAL_BASED_OUTPATIENT_CLINIC_OR_DEPARTMENT_OTHER): Payer: Self-pay | Admitting: Orthopedic Surgery

## 2013-12-17 ENCOUNTER — Other Ambulatory Visit (HOSPITAL_COMMUNITY): Payer: Self-pay | Admitting: Physician Assistant

## 2013-12-17 DIAGNOSIS — M810 Age-related osteoporosis without current pathological fracture: Secondary | ICD-10-CM

## 2013-12-22 ENCOUNTER — Ambulatory Visit (HOSPITAL_COMMUNITY)
Admission: RE | Admit: 2013-12-22 | Discharge: 2013-12-22 | Disposition: A | Discharge status: Home / Self Care | Payer: PRIVATE HEALTH INSURANCE | Source: Ambulatory Visit | Attending: Physician Assistant | Admitting: Physician Assistant

## 2013-12-22 DIAGNOSIS — M810 Age-related osteoporosis without current pathological fracture: Secondary | ICD-10-CM

## 2013-12-22 DIAGNOSIS — Z780 Asymptomatic menopausal state: Secondary | ICD-10-CM | POA: Insufficient documentation

## 2013-12-22 DIAGNOSIS — M949 Disorder of cartilage, unspecified: Principal | ICD-10-CM

## 2013-12-22 DIAGNOSIS — M899 Disorder of bone, unspecified: Secondary | ICD-10-CM | POA: Insufficient documentation

## 2014-09-14 ENCOUNTER — Encounter: Payer: Self-pay | Admitting: Vascular Surgery

## 2014-09-15 ENCOUNTER — Encounter: Payer: Self-pay | Admitting: Vascular Surgery

## 2014-09-15 ENCOUNTER — Ambulatory Visit (INDEPENDENT_AMBULATORY_CARE_PROVIDER_SITE_OTHER): Payer: PRIVATE HEALTH INSURANCE | Admitting: Vascular Surgery

## 2014-09-15 VITALS — BP 107/56 | HR 87 | Resp 14 | Ht 62.0 in | Wt 143.0 lb

## 2014-09-15 DIAGNOSIS — Z48812 Encounter for surgical aftercare following surgery on the circulatory system: Secondary | ICD-10-CM

## 2014-09-15 NOTE — Addendum Note (Signed)
Addended by: Mena Goes on: 09/15/2014 02:56 PM   Modules accepted: Orders

## 2014-09-15 NOTE — Progress Notes (Signed)
   Patient name: Sherri Brooks MRN: [id number] DOB: 20-Mar-1946 Sex: female  REASON FOR VISIT: follow up of nonhealing wound left foot  HPI: Sherri Brooks is a 68 y.o. female who underwent a redo left femoral to below-knee popliteal artery bypass with a 6 mm PTFE graft in January 2014 for a nonhealing left transmetatarsal amputation wound. I last saw the patient in March 2014 at which time her left transmetatarsal amputation site had healed. She was then lost to follow up. The reason for this is that she fractured her left ankle and required surgery. She now returns to try to get back on a routine follow up schedule for her peripheral vascular disease.  She is ambulatory. She denies any significant claudication in either lower extremity. She denies rest pain. Her transmetatarsal indications sign of the left has not given her any problems.   REVIEW OF SYSTEMS: Valu.Nieves ] denotes positive finding; [  ] denotes negative finding  CARDIOVASCULAR:  [ ]  chest pain   [ ]  dyspnea on exertion    CONSTITUTIONAL:  [ ]  fever   [ ]  chills  PHYSICAL EXAM: Filed Vitals:   09/15/14 1312  BP: 107/56  Pulse: 87  Resp: 14  Height: 5\' 2"  (1.575 m)  Weight: 143 lb (64.864 kg)   Body mass index is 26.15 kg/(m^2). GENERAL: The patient is a well-nourished female, in no acute distress. The vital signs are documented above. CARDIOVASCULAR: There is a regular rate and rhythm. I do not detect carotid bruits. She has palpable femoral pulses. I cannot palpate pedal pulses. She has a monophasic posterior tibial signal on the right. I cannot get a dorsalis pedis signal on the right. On the left side she has a very brisk posterior tibial signal with the Doppler and a monophasic dorsalis pedis signal. PULMONARY: There is good air exchange bilaterally without wheezing or rales. She has palpable femoral pulses.  MEDICAL ISSUES: PERIPHERAL VASCULAR DISEASE: Her left femoropopliteal bypass graft appears to be functioning well and  her transmetatarsal amputation site has healed. We will get her back on a routine schedule and I have ordered a duplex of her graft in 6 months and also ABIs. I have discussed with her the importance of tobacco cessation. I have encouraged her to stay as active as possible.  [facility] [place] Beeper: [phone]

## 2014-09-23 ENCOUNTER — Encounter (HOSPITAL_COMMUNITY): Payer: Self-pay | Admitting: Vascular Surgery

## 2015-01-05 ENCOUNTER — Encounter: Payer: Self-pay | Admitting: Cardiology

## 2015-03-16 ENCOUNTER — Other Ambulatory Visit (HOSPITAL_COMMUNITY): Payer: Self-pay

## 2015-03-16 ENCOUNTER — Ambulatory Visit: Payer: PRIVATE HEALTH INSURANCE | Admitting: Vascular Surgery

## 2015-03-16 ENCOUNTER — Encounter (HOSPITAL_COMMUNITY): Payer: Self-pay

## 2015-03-28 ENCOUNTER — Encounter: Payer: Self-pay | Admitting: Vascular Surgery

## 2015-03-29 ENCOUNTER — Other Ambulatory Visit: Payer: Self-pay | Admitting: Vascular Surgery

## 2015-03-29 DIAGNOSIS — I739 Peripheral vascular disease, unspecified: Secondary | ICD-10-CM

## 2015-03-29 DIAGNOSIS — Z48812 Encounter for surgical aftercare following surgery on the circulatory system: Secondary | ICD-10-CM

## 2015-03-31 ENCOUNTER — Encounter (HOSPITAL_COMMUNITY): Payer: Self-pay

## 2015-03-31 ENCOUNTER — Other Ambulatory Visit (HOSPITAL_COMMUNITY): Payer: Self-pay

## 2015-03-31 ENCOUNTER — Ambulatory Visit: Payer: Self-pay | Admitting: Vascular Surgery

## 2015-04-19 ENCOUNTER — Other Ambulatory Visit (HOSPITAL_COMMUNITY): Payer: Self-pay | Admitting: Family Medicine

## 2015-04-19 DIAGNOSIS — Z1231 Encounter for screening mammogram for malignant neoplasm of breast: Secondary | ICD-10-CM

## 2015-04-28 ENCOUNTER — Ambulatory Visit (HOSPITAL_COMMUNITY): Payer: Self-pay

## 2015-05-02 ENCOUNTER — Ambulatory Visit (HOSPITAL_COMMUNITY): Payer: Self-pay

## 2015-05-06 ENCOUNTER — Encounter: Payer: Self-pay | Admitting: Cardiology

## 2015-08-01 ENCOUNTER — Other Ambulatory Visit (HOSPITAL_COMMUNITY): Payer: Self-pay | Admitting: Nephrology

## 2015-08-01 DIAGNOSIS — N289 Disorder of kidney and ureter, unspecified: Secondary | ICD-10-CM

## 2015-08-24 ENCOUNTER — Ambulatory Visit (HOSPITAL_COMMUNITY)
Admission: RE | Admit: 2015-08-24 | Discharge: 2015-08-24 | Disposition: A | Discharge status: Home / Self Care | Payer: Medicare Other | Source: Ambulatory Visit | Attending: Nephrology | Admitting: Nephrology

## 2015-08-24 DIAGNOSIS — N289 Disorder of kidney and ureter, unspecified: Secondary | ICD-10-CM | POA: Diagnosis not present

## 2015-09-30 ENCOUNTER — Encounter: Payer: Self-pay | Admitting: Vascular Surgery

## 2015-10-04 ENCOUNTER — Other Ambulatory Visit: Payer: Self-pay | Admitting: Vascular Surgery

## 2015-10-04 DIAGNOSIS — I739 Peripheral vascular disease, unspecified: Secondary | ICD-10-CM

## 2015-10-04 DIAGNOSIS — Z48812 Encounter for surgical aftercare following surgery on the circulatory system: Secondary | ICD-10-CM

## 2015-10-05 ENCOUNTER — Ambulatory Visit (INDEPENDENT_AMBULATORY_CARE_PROVIDER_SITE_OTHER): Payer: Medicare Other | Admitting: Vascular Surgery

## 2015-10-05 ENCOUNTER — Ambulatory Visit (INDEPENDENT_AMBULATORY_CARE_PROVIDER_SITE_OTHER)
Admission: RE | Admit: 2015-10-05 | Discharge: 2015-10-05 | Disposition: A | Discharge status: Home / Self Care | Payer: Medicare Other | Source: Ambulatory Visit | Attending: Vascular Surgery | Admitting: Vascular Surgery

## 2015-10-05 ENCOUNTER — Encounter: Payer: Self-pay | Admitting: Vascular Surgery

## 2015-10-05 ENCOUNTER — Ambulatory Visit (HOSPITAL_COMMUNITY)
Admission: RE | Admit: 2015-10-05 | Discharge: 2015-10-05 | Disposition: A | Discharge status: Home / Self Care | Payer: Medicare Other | Source: Ambulatory Visit | Attending: Vascular Surgery | Admitting: Vascular Surgery

## 2015-10-05 VITALS — BP 180/70 | HR 77 | Temp 98.2°F | Resp 16 | Ht 63.0 in | Wt 138.0 lb

## 2015-10-05 DIAGNOSIS — Z48812 Encounter for surgical aftercare following surgery on the circulatory system: Secondary | ICD-10-CM | POA: Insufficient documentation

## 2015-10-05 DIAGNOSIS — I739 Peripheral vascular disease, unspecified: Secondary | ICD-10-CM | POA: Insufficient documentation

## 2015-10-05 NOTE — Progress Notes (Signed)
History of Present Illness:  Patient is a 69 y.o. year old female who presents for evaluationfemale after having  undergone her previous left transmetatarsal amputation. She developed a nonhealing wound on this left foot and her only further options for revascularization with a prosthetic left femoropopliteal bypass graft. She had a redo left femoral to below knee popliteal artery bypass graft with a 6 mm PTFE graft on 11/04/2012. We have been following the transmetatarsal amputation wound.  She reports no new health problems and no new ulcers on bilateral LE.  Other medical problems include has Contracture of elbow joint; Weight loss, unintentional; Anorexia; Early satiety; Atherosclerosis of native arteries of the extremities with ulceration(440.23); Atherosclerotic PVD with ulceration ([place]); Type II or unspecified type diabetes mellitus with peripheral circulatory disorders, uncontrolled(250.72); Tobacco use disorder; Open wound of left foot; Anemia of unknown etiology; and Ankle fracture, bimalleolar, closed on her problem list.  Past Medical History  Diagnosis Date  . Diabetes mellitus   . Hypertension   . Hyperlipidemia   . PVD (peripheral vascular disease) ([place])   . Cellulitis of buttock, left 2010  . Arthritis   . GERD (gastroesophageal reflux disease)   . CAD (coronary artery disease)   . MI (myocardial infarction) ([place])   . Wears glasses     Past Surgical History  Procedure Laterality Date  . Femoral-popliteal bypass graft  07/2010    pt has had 3 fem-pop bpg  . Right common iliac stent  10/2009    Dr Doren Custard ([place])  . Tonsillectomy    . S/p hysterecotmy      partial  . Foot amputation  2010    left  . Colonoscopy  10/26/2011    Procedure: COLONOSCOPY;  Surgeon: Dorothyann Peng, MD;  [facility];  Service: Endoscopy;  Laterality: N/A;  1:30  . Femoral-popliteal bypass graft  11/04/2012    Procedure: BYPASS GRAFT FEMORAL-POPLITEAL ARTERY;  Surgeon: Angelia Mould, MD;  Location: [facility] OR;  Service: Vascular;  Laterality: Left;  Redo Left Femoral Popliteal Bypass with PTFE  . Orif ankle fracture Left 01/27/2013    Procedure: OPEN REDUCTION INTERNAL FIXATION (ORIF) ANKLE FRACTURE;  Surgeon: Wylene Simmer, MD;  Location: [place];  Service: Orthopedics;  Laterality: Left;  . Abdominal aortagram N/A 07/28/2012    Procedure: ABDOMINAL Maxcine Ham;  Surgeon: Angelia Mould, MD;  Location: [facility] CATH LAB;  Service: Cardiovascular;  Laterality: N/A;  . Lower extremity angiogram Bilateral 07/28/2012    Procedure: LOWER EXTREMITY ANGIOGRAM;  Surgeon: Angelia Mould, MD;  [facility] C[facility] LAB;  Service: Cardiovascular;  Laterality: Bilateral;  . Percutaneous stent intervention Left 07/28/2012    Procedure: PERCUTANEOUS STENT INTERVENTION;  Surgeon: Angelia Mould, MD;  Location: [facility] CATH LAB;  Service: Cardiovascular;  Laterality: Left;  lt ext tiliac stentx1    Social History Social History  Substance Use Topics  . Smoking status: Light Tobacco Smoker -- 0.40 packs/day for 20 years    Types: Cigarettes    Start date: 11/04/2012  . Smokeless tobacco: Never Used  . Alcohol Use: No    Family History Family History  Problem Relation Age of Onset  . Brain cancer Father   . Cancer Father   . Diabetes Mother     many family members  . Alzheimer's disease Mother   . Heart disease      Allergies  Allergies  Allergen Reactions  . Iohexol      Code: HIVES,  Desc: PT STATES SHE EXPERIENCED ITCHING W/ IV DYE IN PAST-ARS 01/21/10, Onset Date: 14481856      Current Outpatient Prescriptions  Medication Sig Dispense Refill  . albuterol (PROVENTIL HFA;VENTOLIN HFA) 108 (90 BASE) MCG/ACT inhaler Inhale 2 puffs into the lungs every 6 (six) hours as needed. For bronchitis and coughing    . alendronate (FOSAMAX) 70 MG tablet Take 70 mg by mouth every 7 (seven) days. On Mondays    . aspirin 325 MG EC tablet Take 325 mg by mouth  daily.      . clopidogrel (PLAVIX) 75 MG tablet Take 75 mg by mouth daily.      . hydrochlorothiazide (HYDRODIURIL) 25 MG tablet Take 25 mg by mouth daily.    . insulin glargine (LANTUS) 100 UNIT/ML injection Inject 10 Units into the skin at bedtime.    Marland Kitchen labetalol (NORMODYNE) 200 MG tablet Take 200 mg by mouth 2 (two) times daily.      Marland Kitchen LORazepam (ATIVAN) 0.5 MG tablet Take 0.5 mg by mouth at bedtime.     . Multiple Vitamin (MULITIVITAMIN WITH MINERALS) TABS Take 1 tablet by mouth daily.    . simvastatin (ZOCOR) 40 MG tablet Take 40 mg by mouth at bedtime.     Marland Kitchen HYDROcodone-acetaminophen (NORCO) 5-325 MG per tablet Take 1 tablet by mouth every 6 (six) hours as needed for pain. (Patient not taking: Reported on 10/05/2015) 30 tablet 0   No current facility-administered medications for this visit.    ROS:   General:  No weight loss, Fever, chills  HEENT: No recent headaches, no nasal bleeding, no visual changes, no sore throat  Neurologic: No dizziness, blackouts, seizures. No recent symptoms of stroke or mini- stroke. No recent episodes of slurred speech, or temporary blindness.  Cardiac: No recent episodes of chest pain/pressure, no shortness of breath at rest.  No shortness of breath with exertion.  Denies history of atrial fibrillation or irregular heartbeat  Vascular: No history of rest pain in feet.  No history of claudication.  No history of non-healing ulcer, No history of DVT   Pulmonary: No home oxygen, no productive cough, no hemoptysis,  No asthma or wheezing  Musculoskeletal:  '[ ]'$  Arthritis, '[ ]'$  Low back pain,  '[ ]'$  Joint pain  Hematologic:No history of hypercoagulable state.  No history of easy bleeding.  No history of anemia  Gastrointestinal: No hematochezia or melena,  No gastroesophageal reflux, no trouble swallowing  Urinary: '[ ]'$  chronic Kidney disease, '[ ]'$  on HD - '[ ]'$  MWF or '[ ]'$  TTHS, '[ ]'$  Burning with urination, '[ ]'$  Frequent urination, '[ ]'$  Difficulty urinating;    Skin: No rashes  Psychological: No history of anxiety,  No history of depression   Physical Examination  Filed Vitals:   10/05/15 1504 10/05/15 1513  BP: 194/71 180/70  Pulse: 78 77  Temp: 98.2 F (36.8 C)   TempSrc: Oral   Resp: 16   Height: '5\' 3"'$  (1.6 m)   Weight: 138 lb (62.596 kg)   SpO2: 100%     Body mass index is 24.45 kg/(m^2).  General:  Alert and oriented, no acute distress HEENT: Normal Neck: No bruit or JVD Pulmonary: Clear to auscultation bilaterally Cardiac: Regular Rate and Rhythm without murmur Abdomen: Soft, non-tender, non-distended, no mass, no scars Skin: No rash.  Left lateral foot at the incision site is a 1 cm skin opening.  No drainage, no skin discoloration.  Dry skin was debrided from the wound edge.  Antibiotic ointment and dry dressing. Extremity Pulses:  2+ radial, brachial, palpable right femoral pulse, non palpable left femoral, bilateral dorsalis pedis, posterior tibial pulses . Musculoskeletal: No deformity or edema  Neurologic: Upper and lower extremity motor 5/5 and symmetric  DATA:  Left fem pop occluded by pass graft, left transmetatarsal amputation   ABI's  Left 0.30 Right 0.89  ASSESSMENT:   PAD Know Left occluded Fem-pop bypass with marginal arterial blood flow. New wound lateral transmetatarsal amputation site.  PLAN:   Daily warm water with dial soap soaks, dry skin well then  antibiotic ointment and dry dressing.  We want her to f/u in 3 weeks for a wound check.  Dr. Scot Dock explained that it is very important to stop smoking as well.    Theda Sers, Ifeoma Vallin MAUREEN PA-C [facility]  The patient was seen in conjunction with Dr. Scot Dock today  HYPERTENSION: The patient's initial blood pressure today was elevated. We repeated this and this was still elevated. We have encouraged the patient to follow up with their primary care physician for management of their blood pressure.

## 2015-10-05 NOTE — Progress Notes (Signed)
Filed Vitals:   10/05/15 1504 10/05/15 1513  BP: 194/71 180/70  Pulse: 78 77  Temp: 98.2 F (36.8 C)   TempSrc: Oral   Resp: 16   Height: '5\' 3"'$  (1.6 m)   Weight: 138 lb (62.596 kg)   SpO2: 100%

## 2015-10-21 ENCOUNTER — Encounter: Payer: Self-pay | Admitting: Vascular Surgery

## 2015-10-26 ENCOUNTER — Ambulatory Visit: Payer: Medicare Other | Admitting: Vascular Surgery

## 2015-11-10 ENCOUNTER — Ambulatory Visit (HOSPITAL_COMMUNITY): Payer: Medicare Other | Attending: Family Medicine | Admitting: Physical Therapy

## 2015-11-10 DIAGNOSIS — T148 Other injury of unspecified body region: Secondary | ICD-10-CM | POA: Insufficient documentation

## 2015-11-10 DIAGNOSIS — I739 Peripheral vascular disease, unspecified: Secondary | ICD-10-CM | POA: Diagnosis not present

## 2015-11-10 DIAGNOSIS — Z6824 Body mass index (BMI) 24.0-24.9, adult: Secondary | ICD-10-CM | POA: Diagnosis not present

## 2015-11-10 DIAGNOSIS — X58XXXA Exposure to other specified factors, initial encounter: Secondary | ICD-10-CM | POA: Diagnosis not present

## 2015-11-10 DIAGNOSIS — R262 Difficulty in walking, not elsewhere classified: Secondary | ICD-10-CM

## 2015-11-10 DIAGNOSIS — E039 Hypothyroidism, unspecified: Secondary | ICD-10-CM | POA: Diagnosis not present

## 2015-11-10 DIAGNOSIS — Z1389 Encounter for screening for other disorder: Secondary | ICD-10-CM | POA: Diagnosis not present

## 2015-11-10 DIAGNOSIS — L97421 Non-pressure chronic ulcer of left heel and midfoot limited to breakdown of skin: Secondary | ICD-10-CM

## 2015-11-10 DIAGNOSIS — E782 Mixed hyperlipidemia: Secondary | ICD-10-CM | POA: Diagnosis not present

## 2015-11-10 DIAGNOSIS — T148XXA Other injury of unspecified body region, initial encounter: Secondary | ICD-10-CM

## 2015-11-10 DIAGNOSIS — L97502 Non-pressure chronic ulcer of other part of unspecified foot with fat layer exposed: Secondary | ICD-10-CM | POA: Diagnosis not present

## 2015-11-10 DIAGNOSIS — I10 Essential (primary) hypertension: Secondary | ICD-10-CM | POA: Diagnosis not present

## 2015-11-10 DIAGNOSIS — E1165 Type 2 diabetes mellitus with hyperglycemia: Secondary | ICD-10-CM | POA: Diagnosis not present

## 2015-11-10 DIAGNOSIS — E116 Type 2 diabetes mellitus with other specified complications: Secondary | ICD-10-CM | POA: Insufficient documentation

## 2015-11-10 DIAGNOSIS — E785 Hyperlipidemia, unspecified: Secondary | ICD-10-CM | POA: Diagnosis not present

## 2015-11-10 DIAGNOSIS — E1129 Type 2 diabetes mellitus with other diabetic kidney complication: Secondary | ICD-10-CM | POA: Diagnosis not present

## 2015-11-10 NOTE — Therapy (Signed)
[place], [place], [address] Phone: [phone]   Fax:  [phone]  Wound Care Evaluation  Patient Details  Name: Sherri Brooks MRN: [id number] Date of Birth: 01/19/1946 No Data Recorded  Encounter Date: 11/10/2015      PT End of Session - 11/10/15 1632    Visit Number 1   Number of Visits 8   Date for PT Re-Evaluation 12/10/15   Authorization Type UHC medicare   Authorization - Visit Number 1   Authorization - Number of Visits 8   PT Start Time 1430   PT Stop Time 1505   PT Time Calculation (min) 35 min   Activity Tolerance Patient tolerated treatment well   Behavior During Therapy [facility] for tasks assessed/performed      Past Medical History  Diagnosis Date  . Diabetes mellitus   . Hypertension   . Hyperlipidemia   . PVD (peripheral vascular disease) ([place])   . Cellulitis of buttock, left 2010  . Arthritis   . GERD (gastroesophageal reflux disease)   . CAD (coronary artery disease)   . MI (myocardial infarction) ([place])   . Wears glasses     Past Surgical History  Procedure Laterality Date  . Femoral-popliteal bypass graft  07/2010    pt has had 3 fem-pop bpg  . Right common iliac stent  10/2009    Dr Doren Custard ([place])  . Tonsillectomy    . S/p hysterecotmy      partial  . Foot amputation  2010    left  . Colonoscopy  10/26/2011    Procedure: COLONOSCOPY;  Surgeon: Dorothyann Peng, MD;  [facility];  Service: Endoscopy;  Laterality: N/A;  1:30  . Femoral-popliteal bypass graft  11/04/2012    Procedure: BYPASS GRAFT FEMORAL-POPLITEAL ARTERY;  Surgeon: Angelia Mould, MD;  Location: [facility] OR;  Service: Vascular;  Laterality: Left;  Redo Left Femoral Popliteal Bypass with PTFE  . Orif ankle fracture Left 01/27/2013    Procedure: OPEN REDUCTION INTERNAL FIXATION (ORIF) ANKLE FRACTURE;  Surgeon: Wylene Simmer, MD;  Location: [place];  Service: Orthopedics;  Laterality: Left;   . Abdominal aortagram N/A 07/28/2012    Procedure: ABDOMINAL Maxcine Ham;  Surgeon: Angelia Mould, MD;  Location: [facility] CATH LAB;  Service: Cardiovascular;  Laterality: N/A;  . Lower extremity angiogram Bilateral 07/28/2012    Procedure: LOWER EXTREMITY ANGIOGRAM;  Surgeon: Angelia Mould, MD;  Location: [facility] CATH LAB;  Service: Cardiovascular;  Laterality: Bilateral;  . Percutaneous stent intervention Left 07/28/2012    Procedure: PERCUTANEOUS STENT INTERVENTION;  Surgeon: Angelia Mould, MD;  Location: [facility] CATH LAB;  Service: Cardiovascular;  Laterality: Left;  lt ext tiliac stentx1    There were no vitals filed for this visit.  Visit Diagnosis:  Nonhealing nonsurgical wound         Wound Therapy - 11/10/15 1437    Subjective PT states that she was unaware she had a wound on her foot until she went to her vascular MD on the 21st.  The MD told her to keep an eye on the wound.  The wound has increased in size  and   is now drainging therefore she went to her medical doctor who referred her to [place] therapy    Patient and Family Stated Goals wound to heal    Date of Onset 11/05/15   Prior Treatments self care   Pain Assessment 0-10   Pain Score 7  Pain Type Acute pain   Pain Location Foot   Pain Orientation Left   Pain Descriptors / Indicators Aching   Pain Onset On-going   Patients Stated Pain Goal 0   Pain Intervention(s) Repositioned   Evaluation and Treatment Procedures Explained to Patient/Family Yes   Evaluation and Treatment Procedures agreed to   Wound Properties Date First Assessed: 11/10/15 Time First Assessed: 1455 Wound Type: Other (Comment) Location: Foot Location Orientation: Left Wound Description (Comments): lateral aspect of transmetatarsal ampuation    Dressing Type Gauze (Comment)   Dressing Changed Changed   Dressing Status Old drainage   Dressing Change Frequency Every 3 days   Site / Wound Assessment Dry;Pale;Yellow   % Wound base Red  or Granulating 30%   % Wound base Yellow 70%   Peri-wound Assessment Intact   Wound Length (cm) 2.2 cm   Wound Width (cm) 0.7 cm   Wound Depth (cm) 0.3 cm   Undermining (cm) along anterior border    Margins Attached edges (approximated)   Drainage Amount Minimal   Treatment Cleansed;Debridement (Selective)   Selective Debridement - Location --  wound bed and edges of wound   Selective Debridement - Tools Used Forceps;Scissors   Selective Debridement - Tissue Removed slough and callous    Wound Therapy - Clinical Statement Sherri Brooks is a 70 yo female who has had a transmetarsal amputation.  She has had stents placed in her legs in 2014 and now has a progressive wound on the lateral aspect of her Lt foot.  She went to her vascular surgeon on 11/05/2015 who stated that although her blood flow is decreased it is not at a point for surgery.  The wound has been increasing in size therefore Sherri Brooks has been referred to physcial therapy to create a healthy environment for wound healing.  Sherri Brooks also has a small slit (1.5 cm) on her heel.  We will moisturize this and keep an eye on this area.    Wound Therapy - Functional Problem List increased pain with increased walking .   Factors Delaying/Impairing Wound Healing Vascular compromise   Hydrotherapy Plan Debridement;Dressing change;Patient/family education   Wound Therapy - Frequency --  2 x a week for 4 weeks    Wound Therapy - Current Recommendations PT   Dressing  medihoney,2x2 and kling. Netting to hold in place.    Decrease Necrotic Tissue to STG:  2 weeks 25% : LTG 4 weeks 0%   Decrease Necrotic Tissue - Progress Goal set today   Increase Granulation Tissue to STG: 2 weeks 75%; LTG 4 weeks 25%   Increase Granulation Tissue - Progress Goal set today   Decrease Length/Width/Depth by (cm) LTG: 4 weeks 1x.25x.2   Decrease Length/Width/Depth - Progress Goal set today   Improve Drainage Characteristics --  STG: 2 weeks scant    Improve Drainage Characteristics - Progress Goal set today   Patient/Family will be able to  STG:  verbalize the importance of keeping leg in a dependent postion    Patient/Family Instruction Goal - Progress Goal set today   Additional Wound Therapy Goal STG: Pain to decrease to 4/10:  LTG 4 weeks pain to decrease to 2/10    Additional Wound Therapy Goal - Progress Goal set today   Goals/treatment plan/discharge plan were made with and agreed upon by patient/family Yes   Time For Goal Achievement --  4 weeks   Wound Therapy - Potential for Goals Fair  PT Education - 2015/11/22 1631    Education provided Yes   Education Details to keep LE in a depnedent position.   Person(s) Educated Patient   Methods Explanation   Comprehension Verbalized understanding                     G-Codes - 2015/11/22 1633    Functional Assessment Tool Used wound size; hx of amputation; hx of arterial deficiency    Functional Limitation Other PT primary   Other PT Primary Current Status (V8721) At least 20 percent but less than 40 percent impaired, limited or restricted   Other PT Primary Goal Status (L8727) At least 1 percent but less than 20 percent impaired, limited or restricted      Problem List Patient Active Problem List   Diagnosis Date Noted  . Ankle fracture, bimalleolar, closed 12/29/2012  . Anemia of unknown etiology 11/06/2012  . Atherosclerotic PVD with ulceration ([place]) 11/04/2012  . Type II or unspecified type diabetes mellitus with peripheral circulatory disorders, uncontrolled(250.72) 11/04/2012  . Tobacco use disorder 11/04/2012  . Open wound of left foot 11/04/2012  . Atherosclerosis of native arteries of the extremities with ulceration(440.23) 07/09/2012  . Weight loss, unintentional 09/18/2011  . Anorexia 09/18/2011  . Early satiety 09/18/2011  . 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/07/2011   [name], PT  CLT [phone] November 22, 2015, 4:34 PM  [place] [address] [place], [place], [address] Phone: [phone]   Fax:  [phone]  Name: Sherri Brooks MRN: [id number] Date of Birth: November 27, 1945

## 2015-11-14 ENCOUNTER — Encounter: Payer: Self-pay | Admitting: Vascular Surgery

## 2015-11-16 ENCOUNTER — Ambulatory Visit: Payer: Medicare Other | Admitting: Vascular Surgery

## 2015-11-16 ENCOUNTER — Ambulatory Visit (HOSPITAL_COMMUNITY): Payer: Medicare PPO | Attending: Family Medicine

## 2015-11-16 DIAGNOSIS — I252 Old myocardial infarction: Secondary | ICD-10-CM | POA: Diagnosis not present

## 2015-11-16 DIAGNOSIS — E1151 Type 2 diabetes mellitus with diabetic peripheral angiopathy without gangrene: Secondary | ICD-10-CM | POA: Insufficient documentation

## 2015-11-16 DIAGNOSIS — Y838 Other surgical procedures as the cause of abnormal reaction of the patient, or of later complication, without mention of misadventure at the time of the procedure: Secondary | ICD-10-CM | POA: Diagnosis not present

## 2015-11-16 DIAGNOSIS — F17200 Nicotine dependence, unspecified, uncomplicated: Secondary | ICD-10-CM | POA: Diagnosis not present

## 2015-11-16 DIAGNOSIS — K219 Gastro-esophageal reflux disease without esophagitis: Secondary | ICD-10-CM | POA: Diagnosis not present

## 2015-11-16 DIAGNOSIS — T148XXA Other injury of unspecified body region, initial encounter: Secondary | ICD-10-CM

## 2015-11-16 DIAGNOSIS — M79672 Pain in left foot: Secondary | ICD-10-CM | POA: Insufficient documentation

## 2015-11-16 DIAGNOSIS — T8131XA Disruption of external operation (surgical) wound, not elsewhere classified, initial encounter: Secondary | ICD-10-CM | POA: Insufficient documentation

## 2015-11-16 DIAGNOSIS — Z9889 Other specified postprocedural states: Secondary | ICD-10-CM | POA: Insufficient documentation

## 2015-11-16 DIAGNOSIS — I10 Essential (primary) hypertension: Secondary | ICD-10-CM | POA: Diagnosis not present

## 2015-11-16 DIAGNOSIS — E785 Hyperlipidemia, unspecified: Secondary | ICD-10-CM | POA: Insufficient documentation

## 2015-11-16 NOTE — Therapy (Signed)
[place], [place], [address] Phone: [phone]   Fax:  [phone]  Wound Care Therapy  Patient Details  Name: SHAHED YEOMAN MRN: [id number] Date of Birth: 26-Dec-1945 No Data Recorded  Encounter Date: 11/16/2015      PT End of Session - 11/16/15 1832    Visit Number 2   Number of Visits 8   Date for PT Re-Evaluation 12/10/15   Authorization Type UHC medicare   Authorization - Visit Number 2   Authorization - Number of Visits 8   PT Start Time 8832   PT Stop Time 1815   PT Time Calculation (min) 39 min   Activity Tolerance Patient tolerated treatment well   Behavior During Therapy [facility] for tasks assessed/performed      Past Medical History  Diagnosis Date  . Diabetes mellitus   . Hypertension   . Hyperlipidemia   . PVD (peripheral vascular disease) ([place])   . Cellulitis of buttock, left 2010  . Arthritis   . GERD (gastroesophageal reflux disease)   . CAD (coronary artery disease)   . MI (myocardial infarction) ([place])   . Wears glasses     Past Surgical History  Procedure Laterality Date  . Femoral-popliteal bypass graft  07/2010    pt has had 3 fem-pop bpg  . Right common iliac stent  10/2009    Dr Doren Custard ([place])  . Tonsillectomy    . S/p hysterecotmy      partial  . Foot amputation  2010    left  . Colonoscopy  10/26/2011    Procedure: COLONOSCOPY;  Surgeon: Dorothyann Peng, MD;  [facility];  Service: Endoscopy;  Laterality: N/A;  1:30  . Femoral-popliteal bypass graft  11/04/2012    Procedure: BYPASS GRAFT FEMORAL-POPLITEAL ARTERY;  Surgeon: Angelia Mould, MD;  Location: [facility] OR;  Service: Vascular;  Laterality: Left;  Redo Left Femoral Popliteal Bypass with PTFE  . Orif ankle fracture Left 01/27/2013    Procedure: OPEN REDUCTION INTERNAL FIXATION (ORIF) ANKLE FRACTURE;  Surgeon: Wylene Simmer, MD;  Location: [place];  Service: Orthopedics;  Laterality: Left;  .  Abdominal aortagram N/A 07/28/2012    Procedure: ABDOMINAL Maxcine Ham;  Surgeon: Angelia Mould, MD;  Location: [facility] CATH LAB;  Service: Cardiovascular;  Laterality: N/A;  . Lower extremity angiogram Bilateral 07/28/2012    Procedure: LOWER EXTREMITY ANGIOGRAM;  Surgeon: Angelia Mould, MD;  Location: [facility] CATH LAB;  Service: Cardiovascular;  Laterality: Bilateral;  . Percutaneous stent intervention Left 07/28/2012    Procedure: PERCUTANEOUS STENT INTERVENTION;  Surgeon: Angelia Mould, MD;  Location: [facility] CATH LAB;  Service: Cardiovascular;  Laterality: Left;  lt ext tiliac stentx1    There were no vitals filed for this visit.  Visit Diagnosis:  Nonhealing nonsurgical wound      Subjective Assessment - 11/16/15 1824    Subjective Dressing intact, no reports of pain on wound, heel skin split hurts with weight bearing, has been sleeping with leg off mat   Currently in Pain? Yes   Pain Score 5    Pain Location Heel  split skin   Pain Orientation Left   Pain Descriptors / Indicators Aching           Wound Therapy - 11/16/15 1824    Subjective Dressing intact, no reports of pain on wound, heel skin split hurts with weight bearing, has been sleeping with leg off mat   Patient and  Family Stated Goals wound to heal    Date of Onset 11/05/15   Prior Treatments self care   Pain Assessment 0-10   Pain Type Acute pain   Evaluation and Treatment Procedures Explained to Patient/Family Yes   Evaluation and Treatment Procedures agreed to   Wound Properties Date First Assessed: 11/10/15 Time First Assessed: 1455 Wound Type: Other (Comment) Location: Foot Location Orientation: Left Wound Description (Comments): lateral aspect of transmetatarsal ampuation    Dressing Type Gauze (Comment)   Dressing Changed Changed   Dressing Status Old drainage   Dressing Change Frequency Every 3 days   Site / Wound Assessment Dry;Pale;Yellow   % Wound base Red or Granulating 40%   % Wound  base Yellow 60%   Peri-wound Assessment Intact   Undermining (cm) along anterior border   Margins Epibole (rolled edges)   Drainage Amount Minimal   Drainage Description Serous   Treatment Cleansed;Debridement (Selective)   Selective Debridement - Location wound bed and edges of wound   Selective Debridement - Tools Used Forceps;Scalpel   Selective Debridement - Tissue Removed slough and callous    Wound Therapy - Clinical Statement Wound cleansed and debridement focus on removal of dry skin perimeter of wound and slough on wound bed and along border of wound to reduce epibole edges to promote healing.  No reports of pain through session.  Continued with vaseline perimeter of wound and whole foot for dry skin, medihoney, 2x2 and gauze with netting.  Pt was given copy of evaluation.   Wound Therapy - Functional Problem List increased pain with increased walking .   Factors Delaying/Impairing Wound Healing Vascular compromise   Hydrotherapy Plan Debridement;Dressing change;Patient/family education   Wound Therapy - Frequency --  2x/week   Wound Therapy - Current Recommendations PT   Dressing  medihoney,2x2 and kling. Netting to hold in place.           Problem List Patient Active Problem List   Diagnosis Date Noted  . Ankle fracture, bimalleolar, closed 12/29/2012  . Anemia of unknown etiology 11/06/2012  . Atherosclerotic PVD with ulceration ([place]) 11/04/2012  . Type II or unspecified type diabetes mellitus with peripheral circulatory disorders, uncontrolled(250.72) 11/04/2012  . Tobacco use disorder 11/04/2012  . Open wound of left foot 11/04/2012  . Atherosclerosis of native arteries of the extremities with ulceration(440.23) 07/09/2012  . Weight loss, unintentional 09/18/2011  . Anorexia 09/18/2011  . Early satiety 09/18/2011  . 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/07/2011  Ihor Austin, LPTA; CBIS [phone]   Aldona Lento 11/16/2015, 6:33 PM  [place] [address] [place], [place], [address] Phone: [phone]   Fax:  [phone]  Name: DOMINQUE LEVANDOWSKI MRN: [id number] Date of Birth: [birth year]

## 2015-11-22 ENCOUNTER — Telehealth (HOSPITAL_COMMUNITY): Payer: Self-pay

## 2015-11-22 ENCOUNTER — Ambulatory Visit (HOSPITAL_COMMUNITY): Payer: Medicare PPO

## 2015-11-22 NOTE — Telephone Encounter (Signed)
No show, tried to call, mailbox full  Sherri Brooks, [place]; CBIS [phone]

## 2015-11-24 ENCOUNTER — Telehealth (HOSPITAL_COMMUNITY): Payer: Self-pay | Admitting: Physical Therapy

## 2015-11-24 ENCOUNTER — Ambulatory Visit (HOSPITAL_COMMUNITY): Payer: Medicare PPO | Admitting: Physical Therapy

## 2015-11-24 NOTE — Telephone Encounter (Signed)
Spoke to Ms. Leavey's daughter who stated that they were told that someone would call them back with the schedule and nobody ever did.  Pt's daughter was informed that her mother's next visit is on 11/29/15 at 13:45.  Daughter states that she will have her mother there.  [name], [place] CLT [phone]

## 2015-11-29 ENCOUNTER — Ambulatory Visit (HOSPITAL_COMMUNITY): Payer: Medicare PPO

## 2015-11-29 DIAGNOSIS — T148XXA Other injury of unspecified body region, initial encounter: Secondary | ICD-10-CM

## 2015-11-29 DIAGNOSIS — T8131XA Disruption of external operation (surgical) wound, not elsewhere classified, initial encounter: Secondary | ICD-10-CM | POA: Diagnosis not present

## 2015-11-29 NOTE — Therapy (Signed)
[place], [place], [address] Phone: [phone]   Fax:  [phone]  Wound Care Therapy  Patient Details  Name: Sherri Brooks MRN: [id number] Date of Birth: Jun 18, 1946 No Data Recorded  Encounter Date: 11/29/2015      PT End of Session - 11/29/15 1758    Visit Number 3   Number of Visits 8   Date for PT Re-Evaluation 12/10/15   Authorization Type UHC medicare   Authorization - Visit Number 3   Authorization - Number of Visits 8   PT Start Time 1400   PT Stop Time 1428   PT Time Calculation (min) 28 min   Activity Tolerance Patient tolerated treatment well   Behavior During Therapy [facility] for tasks assessed/performed      Past Medical History  Diagnosis Date  . Diabetes mellitus   . Hypertension   . Hyperlipidemia   . PVD (peripheral vascular disease) ([place])   . Cellulitis of buttock, left 2010  . Arthritis   . GERD (gastroesophageal reflux disease)   . CAD (coronary artery disease)   . MI (myocardial infarction) ([place])   . Wears glasses     Past Surgical History  Procedure Laterality Date  . Femoral-popliteal bypass graft  07/2010    pt has had 3 fem-pop bpg  . Right common iliac stent  10/2009    Dr Doren Custard ([place])  . Tonsillectomy    . S/p hysterecotmy      partial  . Foot amputation  2010    left  . Colonoscopy  10/26/2011    Procedure: COLONOSCOPY;  Surgeon: Dorothyann Peng, MD;  [facility];  Service: Endoscopy;  Laterality: N/A;  1:30  . Femoral-popliteal bypass graft  11/04/2012    Procedure: BYPASS GRAFT FEMORAL-POPLITEAL ARTERY;  Surgeon: Angelia Mould, MD;  Location: [facility] OR;  Service: Vascular;  Laterality: Left;  Redo Left Femoral Popliteal Bypass with PTFE  . Orif ankle fracture Left 01/27/2013    Procedure: OPEN REDUCTION INTERNAL FIXATION (ORIF) ANKLE FRACTURE;  Surgeon: Wylene Simmer, MD;  Location: [place];  Service: Orthopedics;  Laterality: Left;  .  Abdominal aortagram N/A 07/28/2012    Procedure: ABDOMINAL Maxcine Ham;  Surgeon: Angelia Mould, MD;  Location: [facility] CATH LAB;  Service: Cardiovascular;  Laterality: N/A;  . Lower extremity angiogram Bilateral 07/28/2012    Procedure: LOWER EXTREMITY ANGIOGRAM;  Surgeon: Angelia Mould, MD;  Location: [facility] CATH LAB;  Service: Cardiovascular;  Laterality: Bilateral;  . Percutaneous stent intervention Left 07/28/2012    Procedure: PERCUTANEOUS STENT INTERVENTION;  Surgeon: Angelia Mould, MD;  Location: [facility] CATH LAB;  Service: Cardiovascular;  Laterality: Left;  lt ext tiliac stentx1    There were no vitals filed for this visit.  Visit Diagnosis:  Nonhealing nonsurgical wound      Subjective Assessment - 11/29/15 1441    Subjective Dressing intact, pt reports she didn't realize she had the past apts, was waiting for a call.  No reports of pain today.     Currently in Pain? No/denies                   Wound Therapy - 11/29/15 1751    Subjective Dressing intact, pt reports she didn't realize she had the past apts, was waiting for a call.  No reports of pain today.     Patient and Family Stated Goals wound to heal    Date of Onset  11/05/15   Prior Treatments self care   Pain Assessment No/denies pain   Wound Properties Date First Assessed: 11/10/15 Time First Assessed: 1455 Wound Type: Other (Comment) Location: Foot Location Orientation: Left Wound Description (Comments): lateral aspect of transmetatarsal ampuation    Dressing Type Gauze (Comment)  Medihoney, vaseline perimeter, gauze and netting   Dressing Changed Changed   Dressing Status Old drainage   Dressing Change Frequency Every 3 days   Site / Wound Assessment Dry;Pale;Yellow   % Wound base Red or Granulating 80%   % Wound base Yellow 20%   Peri-wound Assessment Intact   Undermining (cm) along anterior border   Margins Epibole (rolled edges)   Drainage Amount Minimal   Drainage Description Serous    Treatment Cleansed;Debridement (Selective)   Selective Debridement - Location wound bed and edges of wound   Selective Debridement - Tools Used Forceps;Scalpel   Selective Debridement - Tissue Removed slough and callous    Wound Therapy - Clinical Statement Wound making vast improvement with 20% slough remaining following debridement.  No macerated border noted this session.  Continued selective debridement for removal of slough and dry skin perimeter of wound.  Continued with medihoney, vaseline, gauze and netting.  No reports of pain through session.     Wound Therapy - Functional Problem List increased pain with increased walking .   Factors Delaying/Impairing Wound Healing Vascular compromise   Hydrotherapy Plan Debridement;Dressing change;Patient/family education   Wound Therapy - Frequency --  2x a week for 4 weeks   Wound Therapy - Current Recommendations PT   Dressing  Medihoney, vaseline perimeter, gauze and netting        Problem List Patient Active Problem List   Diagnosis Date Noted  . Ankle fracture, bimalleolar, closed 12/29/2012  . Anemia of unknown etiology 11/06/2012  . Atherosclerotic PVD with ulceration ([place]) 11/04/2012  . Type II or unspecified type diabetes mellitus with peripheral circulatory disorders, uncontrolled(250.72) 11/04/2012  . Tobacco use disorder 11/04/2012  . Open wound of left foot 11/04/2012  . Atherosclerosis of native arteries of the extremities with ulceration(440.23) 07/09/2012  . Weight loss, unintentional 09/18/2011  . Anorexia 09/18/2011  . Early satiety 09/18/2011  . 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/07/2011  Ihor Austin, LPTA; CBIS [phone]   Aldona Lento 11/29/2015, 5:58 PM  [place] [address] [place], [place], [address] Phone: [phone]   Fax:  [phone]  Name: Sherri Brooks MRN: [id number] Date of Birth: 03-21-1946

## 2015-12-01 ENCOUNTER — Ambulatory Visit (HOSPITAL_COMMUNITY): Payer: Medicare PPO | Admitting: Physical Therapy

## 2015-12-01 DIAGNOSIS — T8131XA Disruption of external operation (surgical) wound, not elsewhere classified, initial encounter: Secondary | ICD-10-CM | POA: Diagnosis not present

## 2015-12-01 DIAGNOSIS — T148XXA Other injury of unspecified body region, initial encounter: Secondary | ICD-10-CM

## 2015-12-01 NOTE — Therapy (Signed)
[place], [place], [address] Phone: [phone]   Fax:  [phone]  Wound Care Therapy  Patient Details  Name: Sherri Brooks MRN: [id number] Date of Birth: [birth year] No Data Recorded  Encounter Date: 12/01/2015      PT End of Session - 12/01/15 1435    Visit Number 4   Number of Visits 8   Date for PT Re-Evaluation 12/10/15   Authorization Type UHC medicare   Authorization - Visit Number 4   Authorization - Number of Visits 8   PT Start Time 1350   PT Stop Time 1425   PT Time Calculation (min) 35 min      Past Medical History  Diagnosis Date  . Diabetes mellitus   . Hypertension   . Hyperlipidemia   . PVD (peripheral vascular disease) ([place])   . Cellulitis of buttock, left 2010  . Arthritis   . GERD (gastroesophageal reflux disease)   . CAD (coronary artery disease)   . MI (myocardial infarction) ([place])   . Wears glasses     Past Surgical History  Procedure Laterality Date  . Femoral-popliteal bypass graft  07/2010    pt has had 3 fem-pop bpg  . Right common iliac stent  10/2009    Dr Doren Custard ([place])  . Tonsillectomy    . S/p hysterecotmy      partial  . Foot amputation  2010    left  . Colonoscopy  10/26/2011    Procedure: COLONOSCOPY;  Surgeon: Dorothyann Peng, MD;  [facility];  Service: Endoscopy;  Laterality: N/A;  1:30  . Femoral-popliteal bypass graft  11/04/2012    Procedure: BYPASS GRAFT FEMORAL-POPLITEAL ARTERY;  Surgeon: Angelia Mould, MD;  Location: [facility] OR;  Service: Vascular;  Laterality: Left;  Redo Left Femoral Popliteal Bypass with PTFE  . Orif ankle fracture Left 01/27/2013    Procedure: OPEN REDUCTION INTERNAL FIXATION (ORIF) ANKLE FRACTURE;  Surgeon: Wylene Simmer, MD;  Location: [place];  Service: Orthopedics;  Laterality: Left;  . Abdominal aortagram N/A 07/28/2012    Procedure: ABDOMINAL Maxcine Ham;  Surgeon: Angelia Mould, MD;   Location: [facility] CATH LAB;  Service: Cardiovascular;  Laterality: N/A;  . Lower extremity angiogram Bilateral 07/28/2012    Procedure: LOWER EXTREMITY ANGIOGRAM;  Surgeon: Angelia Mould, MD;  Location: [facility] CATH LAB;  Service: Cardiovascular;  Laterality: Bilateral;  . Percutaneous stent intervention Left 07/28/2012    Procedure: PERCUTANEOUS STENT INTERVENTION;  Surgeon: Angelia Mould, MD;  Location: [facility] CATH LAB;  Service: Cardiovascular;  Laterality: Left;  lt ext tiliac stentx1    There were no vitals filed for this visit.  Visit Diagnosis:  Nonhealing nonsurgical wound                 Wound Therapy - 12/01/15 1428    Subjective Pt states she was unaware that she really doesn't have any pain it is more soreness.    Patient and Family Stated Goals wound to heal    Date of Onset 11/05/15   Prior Treatments self care   Pain Assessment No/denies pain   Evaluation and Treatment Procedures Explained to Patient/Family Yes   Evaluation and Treatment Procedures agreed to   Wound Properties Date First Assessed: 11/10/15 Time First Assessed: 1455 Wound Type: Other (Comment) Location: Foot Location Orientation: Left Wound Description (Comments): lateral aspect of transmetatarsal ampuation    Dressing Type Gauze (Comment)  Medihoney, vaseline perimeter, gauze  and netting   Dressing Changed Changed   Dressing Status Old drainage   Dressing Change Frequency Every 3 days   Site / Wound Assessment Dry;Pale;Yellow   % Wound base Red or Granulating 30%   % Wound base Yellow 70%   Peri-wound Assessment Intact   Wound Length (cm) 2.1 cm   Wound Width (cm) 0.9 cm   Wound Depth (cm) 0.3 cm   Undermining (cm) along anterior boarder    Margins Epibole (rolled edges)   Drainage Amount Minimal   Drainage Description Serous   Treatment Cleansed;Debridement (Selective)   Selective Debridement - Location wound bed and edges of wound   Selective Debridement - Tools Used  Forceps;Scalpel;Scissors   Selective Debridement - Tissue Removed slough and callous    Wound Therapy - Clinical Statement Today pt wound has increased slough and less granulation tissue.  Bladed base of wound bed prior to placing medihoney into wound.  Pt wound edges debrided with forsceps and scissors.  Pt LE very dry therefore significant lotion applied to foot and LE prior to dressing change.    Wound Therapy - Functional Problem List increased pain with increased walking .   Factors Delaying/Impairing Wound Healing Vascular compromise   Hydrotherapy Plan Debridement;Dressing change;Patient/family education   Wound Therapy - Frequency --  2x a week for 4 weeks   Wound Therapy - Current Recommendations PT   Dressing  Medihoney, vaseline perimeter, gauze and netting   Decrease Necrotic Tissue to STG:  2 weeks 25% : LTG 4 weeks 0%   Decrease Necrotic Tissue - Progress Not met   Increase Granulation Tissue to STG: 2 weeks 75%; LTG 4 weeks 25%   Increase Granulation Tissue - Progress Not met   Decrease Length/Width/Depth by (cm) LTG: 4 weeks 1x.25x.2   Decrease Length/Width/Depth - Progress Not met   Improve Drainage Characteristics --  STG: 2 weeks scant   Improve Drainage Characteristics - Progress Not met   Patient/Family will be able to  STG:  verbalize the importance of keeping leg in a dependent postion    Patient/Family Instruction Goal - Progress Met   Additional Wound Therapy Goal STG: Pain to decrease to 4/10:  LTG 4 weeks pain to decrease to 2/10    Additional Wound Therapy Goal - Progress Met   Time For Goal Achievement --  4 weeks   Wound Therapy - Potential for Goals Fair           12/01/15 1428  Wound Therapy Goals - Improve the function of patient's integumentary system by progressing the wound(s) through the phases of wound healing by:  Decrease Necrotic Tissue to STG:  2 weeks 25% : LTG 4 weeks 0%  Decrease Necrotic Tissue - Progress Not met  Increase Granulation  Tissue to STG: 2 weeks 75%; LTG 4 weeks 25%  Increase Granulation Tissue - Progress Not met  Decrease Length/Width/Depth by (cm) LTG: 4 weeks 1x.25x.2  Decrease Length/Width/Depth - Progress Not met  Improve Drainage Characteristics (STG: 2 weeks scant)  Improve Drainage Characteristics - Progress Not met  Patient/Family will be able to  STG:  verbalize the importance of keeping leg in a dependent postion   Patient/Family Instruction Goal - Progress Met  Additional Wound Therapy Goal STG: Pain to decrease to 4/10:  LTG 4 weeks pain to decrease to 2/10   Additional Wound Therapy Goal - Progress Met  Time For Goal Achievement (4 weeks)  Wound Therapy - Potential for Goals Fair  Problem List Patient Active Problem List   Diagnosis Date Noted  . Ankle fracture, bimalleolar, closed 12/29/2012  . Anemia of unknown etiology 11/06/2012  . Atherosclerotic PVD with ulceration ([place]) 11/04/2012  . Type II or unspecified type diabetes mellitus with peripheral circulatory disorders, uncontrolled(250.72) 11/04/2012  . Tobacco use disorder 11/04/2012  . Open wound of left foot 11/04/2012  . Atherosclerosis of native arteries of the extremities with ulceration(440.23) 07/09/2012  . Weight loss, unintentional 09/18/2011  . Anorexia 09/18/2011  . Early satiety 09/18/2011  . 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/07/2011    [name], PT CLT [phone] 12/01/2015, 2:35 PM  [place] [address] [place], [place], [address] Phone: [phone]   Fax:  [phone]  Name: Sherri Brooks MRN: [id number] Date of Birth: [birth year]

## 2015-12-06 ENCOUNTER — Ambulatory Visit (HOSPITAL_COMMUNITY): Payer: Medicare PPO

## 2015-12-06 DIAGNOSIS — T8131XA Disruption of external operation (surgical) wound, not elsewhere classified, initial encounter: Secondary | ICD-10-CM | POA: Diagnosis not present

## 2015-12-06 DIAGNOSIS — T148XXA Other injury of unspecified body region, initial encounter: Secondary | ICD-10-CM

## 2015-12-06 NOTE — Therapy (Signed)
[place], [place], [address] Phone: [phone]   Fax:  [phone]  Physical Therapy Treatment  Patient Details  Name: Sherri Brooks MRN: [id number] Date of Birth: [birth year] No Data Recorded  Encounter Date: 12/06/2015      PT End of Session - 12/06/15 1616    Visit Number 5   Number of Visits 8   Date for PT Re-Evaluation 12/10/15   Authorization Type UHC medicare   Authorization - Visit Number 5   Authorization - Number of Visits 8   PT Start Time 6659   PT Stop Time 1428   PT Time Calculation (min) 36 min   Activity Tolerance Patient tolerated treatment well   Behavior During Therapy [facility] for tasks assessed/performed      Past Medical History  Diagnosis Date  . Diabetes mellitus   . Hypertension   . Hyperlipidemia   . PVD (peripheral vascular disease) ([place])   . Cellulitis of buttock, left 2010  . Arthritis   . GERD (gastroesophageal reflux disease)   . CAD (coronary artery disease)   . MI (myocardial infarction) ([place])   . Wears glasses     Past Surgical History  Procedure Laterality Date  . Femoral-popliteal bypass graft  07/2010    pt has had 3 fem-pop bpg  . Right common iliac stent  10/2009    Dr Doren Custard ([place])  . Tonsillectomy    . S/p hysterecotmy      partial  . Foot amputation  2010    left  . Colonoscopy  10/26/2011    Procedure: COLONOSCOPY;  Surgeon: Dorothyann Peng, MD;  [facility];  Service: Endoscopy;  Laterality: N/A;  1:30  . Femoral-popliteal bypass graft  11/04/2012    Procedure: BYPASS GRAFT FEMORAL-POPLITEAL ARTERY;  Surgeon: Angelia Mould, MD;  Location: [facility] OR;  Service: Vascular;  Laterality: Left;  Redo Left Femoral Popliteal Bypass with PTFE  . Orif ankle fracture Left 01/27/2013    Procedure: OPEN REDUCTION INTERNAL FIXATION (ORIF) ANKLE FRACTURE;  Surgeon: Wylene Simmer, MD;  Location: [place];  Service: Orthopedics;  Laterality:  Left;  . Abdominal aortagram N/A 07/28/2012    Procedure: ABDOMINAL Maxcine Ham;  Surgeon: Angelia Mould, MD;  Location: [facility] CATH LAB;  Service: Cardiovascular;  Laterality: N/A;  . Lower extremity angiogram Bilateral 07/28/2012    Procedure: LOWER EXTREMITY ANGIOGRAM;  Surgeon: Angelia Mould, MD;  Location: [facility] CATH LAB;  Service: Cardiovascular;  Laterality: Bilateral;  . Percutaneous stent intervention Left 07/28/2012    Procedure: PERCUTANEOUS STENT INTERVENTION;  Surgeon: Angelia Mould, MD;  Location: [facility] CATH LAB;  Service: Cardiovascular;  Laterality: Left;  lt ext tiliac stentx1    There were no vitals filed for this visit.  Visit Diagnosis:  Nonhealing nonsurgical wound      Subjective Assessment - 12/06/15 1425    Subjective No reports of pain on wound, continues to have minimal pain on bottom of foot and heel.  Dressing are intact   Currently in Pain? No/denies             Wound Therapy - 12/06/15 1426    Subjective No reports of pain on wound, continues to have minimal pain on bottom of foot and heel.  Dressing are intact   Patient and Family Stated Goals wound to heal    Date of Onset 11/05/15   Prior Treatments self care   Pain Assessment 0-10  Pain Score 1    Pain Type Acute pain   Pain Location Heel  split skin   Pain Orientation Left   Evaluation and Treatment Procedures Explained to Patient/Family Yes   Evaluation and Treatment Procedures agreed to   Wound Properties Date First Assessed: 11/10/15 Time First Assessed: 1455 Wound Type: Other (Comment) Location: Foot Location Orientation: Left Wound Description (Comments): lateral aspect of transmetatarsal ampuation    Dressing Type Gauze (Comment)   Dressing Changed Changed   Dressing Status Old drainage   Dressing Change Frequency Every 3 days   Site / Wound Assessment Dry;Pale;Yellow   % Wound base Red or Granulating 20%   % Wound base Yellow 80%   Undermining (cm) along anterior  border   Margins Epibole (rolled edges)   Drainage Amount Minimal   Drainage Description Serous   Selective Debridement - Location wound bed and edges of wound   Selective Debridement - Tools Used Forceps;Scalpel   Selective Debridement - Tissue Removed slough and callous    Wound Therapy - Clinical Statement Wound with increased adherent slough and less granulation tissue noted.  Selective debridement for removal of slough and callouses perimeter of wound.  Foot has very dry skin, large amount of lotion placed on foot prior dressings.  Bladed wound bed prior placement with medihoney, vaseline perimeter of wound with lotion on whole foot, 2x2, gauze and netting.  No reports of pain through session   Wound Therapy - Functional Problem List increased pain with increased walking .   Factors Delaying/Impairing Wound Healing Vascular compromise   Hydrotherapy Plan Debridement;Dressing change;Patient/family education   Wound Therapy - Frequency --  2x week/ 4 weeks   Wound Therapy - Current Recommendations PT   Dressing  Medihoney, vaseline perimeter, lotion on foot, gauze and netting         Problem List Patient Active Problem List   Diagnosis Date Noted  . Ankle fracture, bimalleolar, closed 12/29/2012  . Anemia of unknown etiology 11/06/2012  . Atherosclerotic PVD with ulceration ([place]) 11/04/2012  . Type II or unspecified type diabetes mellitus with peripheral circulatory disorders, uncontrolled(250.72) 11/04/2012  . Tobacco use disorder 11/04/2012  . Open wound of left foot 11/04/2012  . Atherosclerosis of native arteries of the extremities with ulceration(440.23) 07/09/2012  . Weight loss, unintentional 09/18/2011  . Anorexia 09/18/2011  . Early satiety 09/18/2011  . 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/07/2011   Ihor Austin, LPTA; CBIS [phone]  Aldona Lento 12/06/2015, 4:18 PM  [place], [place],  [address] Phone: [phone]   Fax:  [phone]  Name: Sherri Brooks MRN: [id number] Date of Birth: 10-Jul-1946

## 2015-12-08 ENCOUNTER — Ambulatory Visit (HOSPITAL_COMMUNITY): Payer: Medicare PPO | Admitting: Physical Therapy

## 2015-12-08 DIAGNOSIS — T8131XA Disruption of external operation (surgical) wound, not elsewhere classified, initial encounter: Secondary | ICD-10-CM | POA: Diagnosis not present

## 2015-12-08 DIAGNOSIS — T148XXA Other injury of unspecified body region, initial encounter: Secondary | ICD-10-CM

## 2015-12-08 NOTE — Therapy (Signed)
[place], [place], [address] Phone: [phone]   Fax:  [phone]  Physical Therapy Treatment  Patient Details  Name: Sherri Brooks MRN: [id number] Date of Birth: December 23, 1945 No Data Recorded  Encounter Date: 12/08/2015      PT End of Session - 12/08/15 1546    Visit Number 6   Number of Visits 16   Date for PT Re-Evaluation 01/04/16   Authorization Type [place] - Visit Number 6  reassess/ recert and gcode done on 12/08/2015   Authorization - Number of Visits 8   PT Start Time 1435   PT Stop Time 1510   PT Time Calculation (min) 35 min   Activity Tolerance Patient tolerated treatment well      Past Medical History  Diagnosis Date  . Diabetes mellitus   . Hypertension   . Hyperlipidemia   . PVD (peripheral vascular disease) ([place])   . Cellulitis of buttock, left 2010  . Arthritis   . GERD (gastroesophageal reflux disease)   . CAD (coronary artery disease)   . MI (myocardial infarction) ([place])   . Wears glasses     Past Surgical History  Procedure Laterality Date  . Femoral-popliteal bypass graft  07/2010    pt has had 3 fem-pop bpg  . Right common iliac stent  10/2009    Dr Doren Custard ([place])  . Tonsillectomy    . S/p hysterecotmy      partial  . Foot amputation  2010    left  . Colonoscopy  10/26/2011    Procedure: COLONOSCOPY;  Surgeon: Dorothyann Peng, MD;  [facility];  Service: Endoscopy;  Laterality: N/A;  1:30  . Femoral-popliteal bypass graft  11/04/2012    Procedure: BYPASS GRAFT FEMORAL-POPLITEAL ARTERY;  Surgeon: Angelia Mould, MD;  Location: [facility] OR;  Service: Vascular;  Laterality: Left;  Redo Left Femoral Popliteal Bypass with PTFE  . Orif ankle fracture Left 01/27/2013    Procedure: OPEN REDUCTION INTERNAL FIXATION (ORIF) ANKLE FRACTURE;  Surgeon: Wylene Simmer, MD;  Location: [place];  Service: Orthopedics;  Laterality: Left;  .  Abdominal aortagram N/A 07/28/2012    Procedure: ABDOMINAL Maxcine Ham;  Surgeon: Angelia Mould, MD;  Location: [facility] CATH LAB;  Service: Cardiovascular;  Laterality: N/A;  . Lower extremity angiogram Bilateral 07/28/2012    Procedure: LOWER EXTREMITY ANGIOGRAM;  Surgeon: Angelia Mould, MD;  Location: [facility] CATH LAB;  Service: Cardiovascular;  Laterality: Bilateral;  . Percutaneous stent intervention Left 07/28/2012    Procedure: PERCUTANEOUS STENT INTERVENTION;  Surgeon: Angelia Mould, MD;  Location: [facility] CATH LAB;  Service: Cardiovascular;  Laterality: Left;  lt ext tiliac stentx1    There were no vitals filed for this visit.  Visit Diagnosis:  Nonhealing nonsurgical wound                     Wound Therapy - 12/08/15 1517    Subjective Pt states the bottom of her heel has been bothering her.     Patient and Family Stated Goals wound to heal    Date of Onset 11/05/15   Prior Treatments self care   Pain Assessment 0-10   Pain Score 4    Pain Type Acute pain   Pain Location Heel   Pain Orientation Left   Pain Descriptors / Indicators Aching   Pain Onset On-going   Patients Stated Pain Goal 0   Pain  Intervention(s) --  Explained to pt to keep her shoe off as much as possible.   Evaluation and Treatment Procedures Explained to Patient/Family Yes   Evaluation and Treatment Procedures agreed to   Wound Properties Date First Assessed: 11/10/15 Time First Assessed: 1455 Wound Type: Other (Comment) Location: Foot Location Orientation: Left Wound Description (Comments): lateral aspect of transmetatarsal ampuation    Dressing Type Gauze (Comment)   Dressing Changed Changed   Dressing Status Old drainage   Dressing Change Frequency Every 3 days   Site / Wound Assessment Dry;Granulation tissue;Pale;Yellow   % Wound base Red or Granulating 30%   % Wound base Yellow 70%   Peri-wound Assessment Intact   Wound Length (cm) 2 cm  was 2.2 on 11/10/2015   Wound Width  (cm) 0.7 cm  was .7    Wound Depth (cm) 0.3 cm  was .3    Undermining (cm) along anterior border    Margins Epibole (rolled edges)   Drainage Amount Minimal   Drainage Description Serous   Selective Debridement - Location wound bed and edges of wound   Selective Debridement - Tools Used Forceps;Scalpel   Selective Debridement - Tissue Removed slough and callous    Wound Therapy - Clinical Statement Pt complained of her heel having increased pain which is increased when she puts her shoe on. Pt heel is discolored now with a mushy feel but continues to only have the slit.  Pt encouraged to keep her shoe off as well as to return to the vascular surgeon due to non healing wound.    Wound Therapy - Functional Problem List increased pain with increased walking .   Factors Delaying/Impairing Wound Healing Vascular compromise   Hydrotherapy Plan Debridement;Dressing change;Patient/family education   Wound Therapy - Frequency --  2x week/ 4 weeks   Wound Therapy - Current Recommendations PT   Dressing  Medihoney, vaseline perimeter, and to heel wtih 2x2 and kling followed by honey.etting   Decrease Necrotic Tissue to STG:  2 weeks 25% : LTG 4 weeks 0%   Decrease Necrotic Tissue - Progress Progressing toward goal   Increase Granulation Tissue to STG: 2 weeks 75%; LTG 4 weeks 25%   Increase Granulation Tissue - Progress Progressing toward goal   Decrease Length/Width/Depth by (cm) LTG: 4 weeks 1x.25x.2   Decrease Length/Width/Depth - Progress Progressing toward goal   Improve Drainage Characteristics --  scant    Improve Drainage Characteristics - Progress Met   Patient/Family will be able to  STG:  verbalize the importance of keeping leg in a dependent postion    Patient/Family Instruction Goal - Progress Met   Additional Wound Therapy Goal STG: Pain to decrease to 4/10:  LTG 4 weeks pain to decrease to 2/10    Additional Wound Therapy Goal - Progress Progressing toward goal   Goals/treatment  plan/discharge plan were made with and agreed upon by patient/family Yes   Time For Goal Achievement --  continue skilled PT for 63moe weeks for a total of 8    Wound Therapy - Potential for Goals Fair         12/08/15 1517  Wound Therapy Goals - Improve the function of patient's integumentary system by progressing the wound(s) through the phases of wound healing by:  Decrease Necrotic Tissue to STG:  2 weeks 25% : LTG 4 weeks 0%  Decrease Necrotic Tissue - Progress Progressing toward goal  Increase Granulation Tissue to STG: 2 weeks 75%; LTG 4 weeks 25%  Increase  Granulation Tissue - Progress Progressing toward goal  Decrease Length/Width/Depth by (cm) LTG: 4 weeks 1x.25x.2  Decrease Length/Width/Depth - Progress Progressing toward goal  Improve Drainage Characteristics (scant )  Improve Drainage Characteristics - Progress Met  Patient/Family will be able to  STG:  verbalize the importance of keeping leg in a dependent postion   Patient/Family Instruction Goal - Progress Met  Additional Wound Therapy Goal STG: Pain to decrease to 4/10:  LTG 4 weeks pain to decrease to 2/10   Additional Wound Therapy Goal - Progress Progressing toward goal  Goals/treatment plan/discharge plan were made with and agreed upon by patient/family Yes  Time For Goal Achievement (continue skilled PT for 68moe weeks for a total of 8 )  Wound Therapy - Potential for Goals Fair                             G-Codes - 02017/02/251548    Functional Assessment Tool Used wound size; hx of amputation; hx of arterial deficiency    Functional Limitation Other PT primary   Other PT Primary Current Status ((E3154 At least 20 percent but less than 40 percent impaired, limited or restricted   Other PT Primary Goal Status ((M0867 At least 1 percent but less than 20 percent impaired, limited or restricted      Problem List Patient Active Problem List   Diagnosis Date Noted  . Ankle fracture,  bimalleolar, closed 12/29/2012  . Anemia of unknown etiology 11/06/2012  . Atherosclerotic PVD with ulceration ([place] 11/04/2012  . Type II or unspecified type diabetes mellitus with peripheral circulatory disorders, uncontrolled(250.72) 11/04/2012  . Tobacco use disorder 11/04/2012  . Open wound of left foot 11/04/2012  . Atherosclerosis of native arteries of the extremities with ulceration(440.23) 07/09/2012  . Weight loss, unintentional 09/18/2011  . Anorexia 09/18/2011  . Early satiety 09/18/2011  . 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/07/2011    CRayetta Humphrey PT CLT [phone]-Feb-2017 3:49 PM  [id number] [place] [address]Phone: [phone]  Fax:  [phone] Name: MLANEISHA MINOMRN: [id number] of Birth: 307/25/1947

## 2015-12-12 ENCOUNTER — Telehealth: Payer: Self-pay | Admitting: Vascular Surgery

## 2015-12-12 NOTE — Telephone Encounter (Signed)
Armella lvm on appt desk stating that she needed to see CSD again. I called back at [phone], but her VM is full.

## 2015-12-13 ENCOUNTER — Ambulatory Visit (HOSPITAL_COMMUNITY): Payer: Medicare Other

## 2015-12-14 ENCOUNTER — Ambulatory Visit: Payer: Medicare Other | Admitting: Vascular Surgery

## 2015-12-16 ENCOUNTER — Ambulatory Visit (HOSPITAL_COMMUNITY): Payer: Medicare HMO | Attending: Family Medicine

## 2015-12-16 ENCOUNTER — Telehealth (HOSPITAL_COMMUNITY): Payer: Self-pay

## 2015-12-16 DIAGNOSIS — R262 Difficulty in walking, not elsewhere classified: Secondary | ICD-10-CM | POA: Insufficient documentation

## 2015-12-16 DIAGNOSIS — L97421 Non-pressure chronic ulcer of left heel and midfoot limited to breakdown of skin: Secondary | ICD-10-CM | POA: Insufficient documentation

## 2015-12-16 DIAGNOSIS — T148 Other injury of unspecified body region: Secondary | ICD-10-CM | POA: Insufficient documentation

## 2015-12-16 DIAGNOSIS — L97423 Non-pressure chronic ulcer of left heel and midfoot with necrosis of muscle: Secondary | ICD-10-CM | POA: Insufficient documentation

## 2015-12-16 NOTE — Telephone Encounter (Signed)
Mailbox full and can not leave any messages when I called to check on the pt for misse apptment. NF

## 2015-12-19 ENCOUNTER — Encounter: Payer: Self-pay | Admitting: Vascular Surgery

## 2015-12-20 ENCOUNTER — Ambulatory Visit (HOSPITAL_COMMUNITY): Payer: Medicare HMO | Admitting: Physical Therapy

## 2015-12-20 DIAGNOSIS — M79672 Pain in left foot: Secondary | ICD-10-CM

## 2015-12-20 DIAGNOSIS — I70334 Atherosclerosis of unspecified type of bypass graft(s) of the right leg with ulceration of heel and midfoot: Secondary | ICD-10-CM

## 2015-12-20 DIAGNOSIS — L97421 Non-pressure chronic ulcer of left heel and midfoot limited to breakdown of skin: Secondary | ICD-10-CM | POA: Diagnosis not present

## 2015-12-20 DIAGNOSIS — T148 Other injury of unspecified body region: Secondary | ICD-10-CM | POA: Diagnosis not present

## 2015-12-20 DIAGNOSIS — R262 Difficulty in walking, not elsewhere classified: Secondary | ICD-10-CM | POA: Diagnosis not present

## 2015-12-20 DIAGNOSIS — L97423 Non-pressure chronic ulcer of left heel and midfoot with necrosis of muscle: Secondary | ICD-10-CM | POA: Diagnosis not present

## 2015-12-20 DIAGNOSIS — R2681 Unsteadiness on feet: Secondary | ICD-10-CM

## 2015-12-20 NOTE — Therapy (Addendum)
[place], [place], [address] Phone: [phone]   Fax:  [phone]  Wound Care Therapy  Patient Details  Name: Sherri Brooks MRN: [id number] Date of Birth: [birth year] No Data Recorded  Encounter Date: 12/20/2015      PT End of Session - 12/20/15 1521    Visit Number 7   Number of Visits 16   Date for PT Re-Evaluation 01/04/16   Authorization Type [employer] medicare reassess and gcode done at visit 6   Authorization - Visit Number 7   Authorization - Number of Visits 16   PT Start Time 1440   PT Stop Time 1505   PT Time Calculation (min) 25 min   Activity Tolerance Patient limited by pain   Behavior During Therapy [facility] for tasks assessed/performed      Past Medical History  Diagnosis Date  . Diabetes mellitus   . Hypertension   . Hyperlipidemia   . PVD (peripheral vascular disease) ([place])   . Cellulitis of buttock, left 2010  . Arthritis   . GERD (gastroesophageal reflux disease)   . CAD (coronary artery disease)   . MI (myocardial infarction) ([place])   . Wears glasses     Past Surgical History  Procedure Laterality Date  . Femoral-popliteal bypass graft  07/2010    pt has had 3 fem-pop bpg  . Right common iliac stent  10/2009    Dr Doren Custard ([place])  . Tonsillectomy    . S/p hysterecotmy      partial  . Foot amputation  2010    left  . Colonoscopy  10/26/2011    Procedure: COLONOSCOPY;  Surgeon: Dorothyann Peng, MD;  Location: [facility];  Service: Endoscopy;  Laterality: N/A;  1:30  . Femoral-popliteal bypass graft  11/04/2012    Procedure: BYPASS GRAFT FEMORAL-POPLITEAL ARTERY;  Surgeon: Angelia Mould, MD;  Location: [facility] OR;  Service: Vascular;  Laterality: Left;  Redo Left Femoral Popliteal Bypass with PTFE  . Orif ankle fracture Left 01/27/2013    Procedure: OPEN REDUCTION INTERNAL FIXATION (ORIF) ANKLE FRACTURE;  Surgeon: Wylene Simmer, MD;  Location: [place];  Service:  Orthopedics;  Laterality: Left;  . Abdominal aortagram N/A 07/28/2012    Procedure: ABDOMINAL Maxcine Ham;  Surgeon: Angelia Mould, MD;  Location: [facility] CATH LAB;  Service: Cardiovascular;  Laterality: N/A;  . Lower extremity angiogram Bilateral 07/28/2012    Procedure: LOWER EXTREMITY ANGIOGRAM;  Surgeon: Angelia Mould, MD;  Location: [facility] CATH LAB;  Service: Cardiovascular;  Laterality: Bilateral;  . Percutaneous stent intervention Left 07/28/2012    Procedure: PERCUTANEOUS STENT INTERVENTION;  Surgeon: Angelia Mould, MD;  Location: [facility] CATH LAB;  Service: Cardiovascular;  Laterality: Left;  lt ext tiliac stentx1    There were no vitals filed for this visit.  Visit Diagnosis:  Nonhealing nonsurgical wound                 Wound Therapy - 12/20/15 1509    Subjective Pt had no pain upon entering department.  Debridement is very painful to pt therefore at end of teratment pain was high.  Pt is going to vascular surgeon on the 10th.  She has not been in to treatment since 12/08/15 due to not having any transportation    Patient and Family Stated Goals wound to heal    Date of Onset 11/05/15   Prior Treatments self care   Pain Assessment 0-10   Pain Score  6    Pain Type Chronic pain;Neuropathic pain   Pain Location Foot   Pain Orientation Left   Pain Descriptors / Indicators Aching;Burning   Pain Onset Sudden  with debridement    Patients Stated Pain Goal 0   Pain Intervention(s) Emotional support;Other (Comment)  limited debridement    Evaluation and Treatment Procedures Explained to Patient/Family Yes   Evaluation and Treatment Procedures agreed to   Wound Properties Date First Assessed: 11/10/15 Time First Assessed: 1455 Wound Type: Other (Comment) Location: Foot Location Orientation: Left Wound Description (Comments): lateral aspect of transmetatarsal ampuation    Dressing Type Gauze (Comment)  with medihoney   Dressing Changed Changed   Dressing Status  Old drainage   Dressing Change Frequency --  Pt has not changed dressing in two weeks    Site / Wound Assessment Dry;Friable;Pale   % Wound base Red or Granulating 20%   % Wound base Yellow 80%   Peri-wound Assessment Intact   Wound Length (cm) 1.9 cm   Wound Width (cm) 1 cm   Wound Depth (cm) 0.3 cm   Undermining (cm) along anterior border at least .3 cm    Margins Epibole (rolled edges)   Drainage Amount Scant   Drainage Description Serous   Treatment Cleansed;Debridement (Selective)   Selective Debridement - Location edges of wound bed    Selective Debridement - Tools Used Forceps;Scissors   Selective Debridement - Tissue Removed slough, callous    Wound Therapy - Clinical Statement Pt is now wearing a post op shoe. Heel area continues to be discolored and mushy but is not opened.  PT wound shows no sign of infection but n sign of healing. Therapist suspects arterial insufficiency.  Pt will continue to come to treatment until she sees her vascualr surgeon.    Wound Therapy - Functional Problem List increased pain with increased walking .   Factors Delaying/Impairing Wound Healing Altered sensation;Diabetes Mellitus;Multiple medical problems;Polypharmacy;Vascular compromise   Hydrotherapy Plan Debridement;Dressing change   Wound Therapy - Frequency --  Pt is suppose to come 2x/week but has not been in 2 weeks.    Wound Therapy - Current Recommendations PT;Surgery consult  vascular surgeon    Dressing  medihoney to wound; vaseling to periphery and heel followed by 4x4, kling and netting    Decrease Necrotic Tissue to STG:  2 weeks 25% : LTG 4 weeks 0%   Decrease Necrotic Tissue - Progress Not progressing   Increase Granulation Tissue to STG: 2 weeks 75%; LTG 4 weeks 25%   Increase Granulation Tissue - Progress Not met   Decrease Length/Width/Depth by (cm) LTG: 4 weeks 1x.25x.2   Decrease Length/Width/Depth - Progress Not progressing   Additional Wound Therapy Goal STG: Pain to  decrease to 4/10:  LTG 4 weeks pain to decrease to 2/10    Additional Wound Therapy Goal - Progress Progressing toward goal   Goals/treatment plan/discharge plan were made with and agreed upon by patient/family Yes   Wound Therapy - Potential for Goals [place]                Problem List Patient Active Problem List   Diagnosis Date Noted  . Ankle fracture, bimalleolar, closed 12/29/2012  . Anemia of unknown etiology 11/06/2012  . Atherosclerotic PVD with ulceration ([place]) 11/04/2012  . Type II or unspecified type diabetes mellitus with peripheral circulatory disorders, uncontrolled(250.72) 11/04/2012  . Tobacco use disorder 11/04/2012  . Open wound of left foot 11/04/2012  . Atherosclerosis of native arteries  of the extremities with ulceration(440.23) 07/09/2012  . Weight loss, unintentional 09/18/2011  . Anorexia 09/18/2011  . Early satiety 09/18/2011  . 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/07/2011    [name], PT CLT [phone] 12/20/2015, 3:22 PM  [facility] [address] [place], [place], [address] Phone: [phone]   Fax:  [phone]  Name: KALEEA PENNER MRN: [id number] Date of Birth: [birth year]

## 2015-12-22 ENCOUNTER — Telehealth (HOSPITAL_COMMUNITY): Payer: Self-pay

## 2015-12-22 ENCOUNTER — Ambulatory Visit (HOSPITAL_COMMUNITY): Payer: Medicare HMO

## 2015-12-22 NOTE — Telephone Encounter (Signed)
No show, called and spoke to pt who stated she thought she had already cancelled apt for today.  She has apt scheduled tomorrow with vascular surgeon to request pain control due wound and to discuss decreased blood flow.  Reminded next apt date and time.  [address] [place]; CBIS [phone]

## 2015-12-23 ENCOUNTER — Telehealth (HOSPITAL_COMMUNITY): Payer: Self-pay

## 2015-12-23 ENCOUNTER — Other Ambulatory Visit: Payer: Self-pay

## 2015-12-23 ENCOUNTER — Ambulatory Visit (INDEPENDENT_AMBULATORY_CARE_PROVIDER_SITE_OTHER): Payer: Medicare HMO | Admitting: Vascular Surgery

## 2015-12-23 VITALS — BP 191/65 | HR 80 | Ht 63.0 in | Wt 146.8 lb

## 2015-12-23 DIAGNOSIS — Z48812 Encounter for surgical aftercare following surgery on the circulatory system: Secondary | ICD-10-CM

## 2015-12-23 MED ORDER — OXYCODONE-ACETAMINOPHEN 5-325 MG PO TABS: 1.0000 | ORAL_TABLET | ORAL | Status: DC | PRN

## 2015-12-23 NOTE — Progress Notes (Signed)
[facility]  Patient name: Sherri Brooks MRN: [id number] DOB: 18-Jul-1946 Sex: female  REASON FOR VISIT: Nonhealing wound of the left foot.  HPI: Sherri Brooks is a 70 y.o. female was had multiple attempts at revascularization of the left lower extremity. She has diffuse multilevel disease. She has undergone previous stenting of the left external iliac artery back in 2013. She originally underwent a left femoral to below-knee popliteal artery bypass with a PTFE graft and a left transmetatarsal amputation. The great saphenous vein was not adequate for a bypass conduit. She said only presented with an occluded graft and underwent a redo bypass. This was on 11/04/2012. This also occluded. However when she was last seen she had no open wounds on the transmetatarsal amputation site and her symptoms were tolerable.  She has now developed a wound on the lateral aspect of her transmetatarsal amputation site and is sent for evaluation for revascularization. She has rest pain in the left foot. She has claudication at short distance and both lower extremities. She does continue to smoke 2 cigarettes a day.  Past Medical History  Diagnosis Date  . Diabetes mellitus   . Hypertension   . Hyperlipidemia   . PVD (peripheral vascular disease) ([place])   . Cellulitis of buttock, left 2010  . Arthritis   . GERD (gastroesophageal reflux disease)   . CAD (coronary artery disease)   . MI (myocardial infarction) ([place])   . Wears glasses     Family History  Problem Relation Age of Onset  . Brain cancer Father   . Cancer Father   . Diabetes Mother     many family members  . Alzheimer's disease Mother   . Heart disease      SOCIAL HISTORY: Social History  Substance Use Topics  . Smoking status: Light Tobacco Smoker -- 0.40 packs/day for 20 years    Types: Cigarettes    Start date: 11/04/2012  . Smokeless tobacco: Never Used  . Alcohol Use: No    Allergies  Allergen  Reactions  . Iohexol      Code: HIVES, Desc: PT STATES SHE EXPERIENCED ITCHING W/ IV DYE IN PAST-ARS 01/21/10, Onset Date: [id number]     Current Outpatient Prescriptions  Medication Sig Dispense Refill  . albuterol (PROVENTIL HFA;VENTOLIN HFA) 108 (90 BASE) MCG/ACT inhaler Inhale 2 puffs into the lungs every 6 (six) hours as needed. For bronchitis and coughing    . alendronate (FOSAMAX) 70 MG tablet Take 70 mg by mouth every 7 (seven) days. On Mondays    . aspirin 325 MG EC tablet Take 325 mg by mouth daily.      . clopidogrel (PLAVIX) 75 MG tablet Take 75 mg by mouth daily.      . hydrochlorothiazide (HYDRODIURIL) 25 MG tablet Take 25 mg by mouth daily.    . insulin glargine (LANTUS) 100 UNIT/ML injection Inject 10 Units into the skin at bedtime.    Marland Kitchen labetalol (NORMODYNE) 200 MG tablet Take 200 mg by mouth 2 (two) times daily.      Marland Kitchen LORazepam (ATIVAN) 0.5 MG tablet Take 0.5 mg by mouth at bedtime.     . Multiple Vitamin (MULITIVITAMIN WITH MINERALS) TABS Take 1 tablet by mouth daily.    . simvastatin (ZOCOR) 40 MG tablet Take 40 mg by mouth at bedtime.     Marland Kitchen HYDROcodone-acetaminophen (NORCO) 5-325 MG per tablet Take 1 tablet by mouth every 6 (six) hours as needed for pain. (Patient  not taking: Reported on 10/05/2015) 30 tablet 0  . oxyCODONE-acetaminophen (PERCOCET/ROXICET) 5-325 MG tablet Take 1 tablet by mouth every 4 (four) hours as needed for severe pain. 30 tablet 0   No current facility-administered medications for this visit.    REVIEW OF SYSTEMS:  '[X]'$  denotes positive finding, '[ ]'$  denotes negative finding Cardiac  Comments:  Chest pain or chest pressure:    Shortness of breath upon exertion:    Short of breath when lying flat:    Irregular heart rhythm:        Vascular    Pain in calf, thigh, or hip brought on by ambulation:    Pain in feet at night that wakes you up from your sleep:     Blood clot in your veins:    Leg swelling:         Pulmonary    Oxygen at home:      Productive cough:     Wheezing:         Neurologic    Sudden weakness in arms or legs:     Sudden numbness in arms or legs:     Sudden onset of difficulty speaking or slurred speech:    Temporary loss of vision in one eye:     Problems with dizziness:         Gastrointestinal    Blood in stool:     Vomited blood:         Genitourinary    Burning when urinating:     Blood in urine:        Psychiatric    Major depression:         Hematologic    Bleeding problems:    Problems with blood clotting too easily:        Skin    Rashes or ulcers:        Constitutional    Fever or chills:      PHYSICAL EXAM: Filed Vitals:   12/23/15 1054 12/23/15 1058  BP: 185/55 191/65  Pulse: 80   Height: '5\' 3"'$  (1.6 m)   Weight: 146 lb 12.8 oz (66.588 kg)   SpO2: 100%     GENERAL: The patient is a well-nourished female, in no acute distress. The vital signs are documented above. CARDIAC: There is a regular rate and rhythm.  VASCULAR: I do not detect carotid bruits. She has a palpable right femoral pulse. I cannot palpate a left femoral pulse. I cannot palpate popliteal or pedal pulses on either side. He has no significant lower extremity swelling. PULMONARY: There is good air exchange bilaterally without wheezing or rales. ABDOMEN: Soft and non-tender with normal pitched bowel sounds.  MUSCULOSKELETAL: She has a transmetatarsal amputation on the left. NEUROLOGIC: No focal weakness or paresthesias are detected. SKIN: There is a superficial ulceration on the lateral aspect of her left transmetatarsal amputation that measures 1 cm x 1.5 cm. PSYCHIATRIC: The patient has a normal affect.  DATA:  She has a monophasic left peroneal signal with the Doppler only. She has a monophasic right peroneal, anterior tibial, and posterior tibial signal.  MEDICAL ISSUES:  NONHEALING WOUND LEFT TRANSMETATARSAL AMPUTATION: This patient has a nonhealing wound of the left lower extremity with evidence  of multilevel arterial occlusive disease. She has diffuse disease and has had multiple attempts at revascularization as noted above. Her options are very limited at this point. However, I think the only potential chance for limb salvage would be to proceed with arteriography and  evaluate her options for revascularization. She does not have a left femoral pulse and does not have great inflow on the right so may need to be considered for an axillobifemoral bypass graft. I will study the arch in addition to her infrarenal aorta and lower extremity runoff.  I have reviewed with the patient the indications for arteriography. In addition, I have reviewed the potential complications of arteriography including but not limited to: Bleeding, arterial injury, arterial thrombosis, dye action, renal insufficiency, or other unpredictable medical problems. I have explained to the patient that if we find disease amenable to angioplasty we could potentially address this at the same time. I have discussed the potential complications of angioplasty and stenting, including but not limited to: Bleeding, arterial thrombosis, arterial injury, dissection, or the need for surgical intervention.   We have also had a long discussion about the importance of tobacco cessation. Given that she may not have any further options for revascularization this is especially important at this point. Currently the wound on her left transmetatarsal amputation site is fairly superficial and perhaps she could squeak by. However she continues to smoke clearly this is not possible. The other consideration might be hyperbaric oxygen if she does not have any options for revascularization. I will make further recommendations pending the results of her arteriogram which is scheduled for 01/02/2016. No Follow-up on file.   Deitra Mayo [facility] [place]: [phone]

## 2015-12-23 NOTE — Telephone Encounter (Signed)
She will have a dye test due to some blockages and her daughter will call us back about the schedule on the 20th. NF

## 2015-12-27 ENCOUNTER — Ambulatory Visit (HOSPITAL_COMMUNITY): Payer: Medicare Other

## 2015-12-29 ENCOUNTER — Ambulatory Visit (HOSPITAL_COMMUNITY): Payer: Medicare Other | Admitting: Physical Therapy

## 2016-01-02 ENCOUNTER — Ambulatory Visit (HOSPITAL_COMMUNITY)
Admission: RE | Admit: 2016-01-02 | Discharge: 2016-01-02 | Disposition: A | Discharge status: Home / Self Care | Payer: Medicare HMO | Source: Ambulatory Visit | Attending: Vascular Surgery | Admitting: Vascular Surgery

## 2016-01-02 ENCOUNTER — Encounter (HOSPITAL_COMMUNITY)
Admission: RE | Disposition: A | Discharge status: Home / Self Care | Payer: Self-pay | Source: Ambulatory Visit | Attending: Vascular Surgery

## 2016-01-02 DIAGNOSIS — E1151 Type 2 diabetes mellitus with diabetic peripheral angiopathy without gangrene: Secondary | ICD-10-CM | POA: Diagnosis not present

## 2016-01-02 DIAGNOSIS — I2510 Atherosclerotic heart disease of native coronary artery without angina pectoris: Secondary | ICD-10-CM | POA: Diagnosis not present

## 2016-01-02 DIAGNOSIS — E785 Hyperlipidemia, unspecified: Secondary | ICD-10-CM | POA: Diagnosis not present

## 2016-01-02 DIAGNOSIS — Z794 Long term (current) use of insulin: Secondary | ICD-10-CM | POA: Insufficient documentation

## 2016-01-02 DIAGNOSIS — S91302A Unspecified open wound, left foot, initial encounter: Secondary | ICD-10-CM | POA: Diagnosis present

## 2016-01-02 DIAGNOSIS — K219 Gastro-esophageal reflux disease without esophagitis: Secondary | ICD-10-CM | POA: Diagnosis not present

## 2016-01-02 DIAGNOSIS — F17210 Nicotine dependence, cigarettes, uncomplicated: Secondary | ICD-10-CM | POA: Diagnosis not present

## 2016-01-02 DIAGNOSIS — R69 Illness, unspecified: Secondary | ICD-10-CM | POA: Diagnosis not present

## 2016-01-02 DIAGNOSIS — Y812 Prosthetic and other implants, materials and accessory general- and plastic-surgery devices associated with adverse incidents: Secondary | ICD-10-CM | POA: Insufficient documentation

## 2016-01-02 DIAGNOSIS — M1990 Unspecified osteoarthritis, unspecified site: Secondary | ICD-10-CM | POA: Diagnosis not present

## 2016-01-02 DIAGNOSIS — Z7902 Long term (current) use of antithrombotics/antiplatelets: Secondary | ICD-10-CM | POA: Insufficient documentation

## 2016-01-02 DIAGNOSIS — I739 Peripheral vascular disease, unspecified: Secondary | ICD-10-CM | POA: Diagnosis present

## 2016-01-02 DIAGNOSIS — I252 Old myocardial infarction: Secondary | ICD-10-CM | POA: Diagnosis not present

## 2016-01-02 DIAGNOSIS — L97529 Non-pressure chronic ulcer of other part of left foot with unspecified severity: Secondary | ICD-10-CM | POA: Insufficient documentation

## 2016-01-02 DIAGNOSIS — I10 Essential (primary) hypertension: Secondary | ICD-10-CM | POA: Insufficient documentation

## 2016-01-02 DIAGNOSIS — Z89432 Acquired absence of left foot: Secondary | ICD-10-CM | POA: Insufficient documentation

## 2016-01-02 DIAGNOSIS — Z7982 Long term (current) use of aspirin: Secondary | ICD-10-CM | POA: Insufficient documentation

## 2016-01-02 DIAGNOSIS — T82856A Stenosis of peripheral vascular stent, initial encounter: Secondary | ICD-10-CM | POA: Insufficient documentation

## 2016-01-02 DIAGNOSIS — T8789 Other complications of amputation stump: Secondary | ICD-10-CM | POA: Diagnosis not present

## 2016-01-02 DIAGNOSIS — I70245 Atherosclerosis of native arteries of left leg with ulceration of other part of foot: Secondary | ICD-10-CM | POA: Insufficient documentation

## 2016-01-02 HISTORY — PX: LOWER EXTREMITY ANGIOGRAM: SHX5508

## 2016-01-02 HISTORY — PX: PERIPHERAL VASCULAR CATHETERIZATION: SHX172C

## 2016-01-02 LAB — POCT I-STAT, CHEM 8
BUN: 23 mg/dL — ABNORMAL HIGH (ref 6–20)
CHLORIDE: 109 mmol/L (ref 101–111)
Calcium, Ion: 1.12 mmol/L — ABNORMAL LOW (ref 1.13–1.30)
Creatinine, Ser: 1.6 mg/dL — ABNORMAL HIGH (ref 0.44–1.00)
GLUCOSE: 131 mg/dL — AB (ref 65–99)
HCT: 35 % — ABNORMAL LOW (ref 36.0–46.0)
HEMOGLOBIN: 11.9 g/dL — AB (ref 12.0–15.0)
POTASSIUM: 4.5 mmol/L (ref 3.5–5.1)
SODIUM: 141 mmol/L (ref 135–145)
TCO2: 23 mmol/L (ref 0–100)

## 2016-01-02 LAB — POCT ACTIVATED CLOTTING TIME
ACTIVATED CLOTTING TIME: 214 s
ACTIVATED CLOTTING TIME: 188 s
Activated Clotting Time: 214 seconds
Activated Clotting Time: 167 seconds

## 2016-01-02 LAB — GLUCOSE, CAPILLARY
GLUCOSE-CAPILLARY: 167 mg/dL — AB (ref 65–99)
GLUCOSE-CAPILLARY: 200 mg/dL — AB (ref 65–99)

## 2016-01-02 SURGERY — ABDOMINAL AORTOGRAM

## 2016-01-02 MED ORDER — ALPRAZOLAM 0.5 MG PO TABS
0.5000 mg | ORAL_TABLET | Freq: Once | ORAL | Status: AC
  Administered 2016-01-02: 0.5 mg via ORAL

## 2016-01-02 MED ORDER — MIDAZOLAM HCL 2 MG/2ML IJ SOLN
INTRAMUSCULAR | Status: AC
  Filled 2016-01-02: qty 2

## 2016-01-02 MED ORDER — HEPARIN SODIUM (PORCINE) 1000 UNIT/ML IJ SOLN
INTRAMUSCULAR | Status: DC | PRN
  Administered 2016-01-02: 4000 [IU] via INTRAVENOUS

## 2016-01-02 MED ORDER — METHYLPREDNISOLONE SODIUM SUCC 125 MG IJ SOLR
INTRAMUSCULAR | Status: AC
  Administered 2016-01-02: 125 mg via INTRAVENOUS
  Filled 2016-01-02: qty 2

## 2016-01-02 MED ORDER — FAMOTIDINE IN NACL 20-0.9 MG/50ML-% IV SOLN: 20.0000 mg | INTRAVENOUS | Status: AC

## 2016-01-02 MED ORDER — HEPARIN (PORCINE) IN NACL 2-0.9 UNIT/ML-% IJ SOLN
INTRAMUSCULAR | Status: AC
  Filled 2016-01-02: qty 1000

## 2016-01-02 MED ORDER — HYDRALAZINE HCL 20 MG/ML IJ SOLN: 10.0000 mg | INTRAMUSCULAR | Status: DC | PRN

## 2016-01-02 MED ORDER — FENTANYL CITRATE (PF) 100 MCG/2ML IJ SOLN
INTRAMUSCULAR | Status: DC | PRN
  Administered 2016-01-02: 50 ug via INTRAVENOUS

## 2016-01-02 MED ORDER — IODIXANOL 320 MG/ML IV SOLN
INTRAVENOUS | Status: DC | PRN
  Administered 2016-01-02: 70 mL via INTRAVENOUS

## 2016-01-02 MED ORDER — METHYLPREDNISOLONE SODIUM SUCC 125 MG IJ SOLR: 125.0000 mg | INTRAMUSCULAR | Status: AC

## 2016-01-02 MED ORDER — LIDOCAINE HCL (PF) 1 % IJ SOLN
INTRAMUSCULAR | Status: AC
  Filled 2016-01-02: qty 30

## 2016-01-02 MED ORDER — HYDRALAZINE HCL 20 MG/ML IJ SOLN
INTRAMUSCULAR | Status: DC | PRN
  Administered 2016-01-02 (×2): 10 mg via INTRAVENOUS

## 2016-01-02 MED ORDER — ALPRAZOLAM 0.25 MG PO TABS
ORAL_TABLET | ORAL | Status: AC
  Filled 2016-01-02: qty 2

## 2016-01-02 MED ORDER — DIPHENHYDRAMINE HCL 50 MG/ML IJ SOLN: 25.0000 mg | INTRAMUSCULAR | Status: AC

## 2016-01-02 MED ORDER — SODIUM CHLORIDE 0.9 % IV SOLN
INTRAVENOUS | Status: DC
Start: 2016-01-02 — End: 2016-01-02
  Administered 2016-01-02: 09:00:00 via INTRAVENOUS

## 2016-01-02 MED ORDER — HYDRALAZINE HCL 20 MG/ML IJ SOLN
INTRAMUSCULAR | Status: AC
  Filled 2016-01-02: qty 1

## 2016-01-02 MED ORDER — SODIUM CHLORIDE 0.9 % IV SOLN: 1.0000 mL/kg/h | INTRAVENOUS | Status: DC

## 2016-01-02 MED ORDER — MIDAZOLAM HCL 2 MG/2ML IJ SOLN
INTRAMUSCULAR | Status: DC | PRN
  Administered 2016-01-02: 1 mg via INTRAVENOUS

## 2016-01-02 MED ORDER — HEPARIN (PORCINE) IN NACL 2-0.9 UNIT/ML-% IJ SOLN
INTRAMUSCULAR | Status: DC | PRN
  Administered 2016-01-02: 1000 mL

## 2016-01-02 MED ORDER — LABETALOL HCL 5 MG/ML IV SOLN
INTRAVENOUS | Status: DC | PRN
  Administered 2016-01-02 (×2): 10 mg via INTRAVENOUS

## 2016-01-02 MED ORDER — DIPHENHYDRAMINE HCL 50 MG/ML IJ SOLN
INTRAMUSCULAR | Status: AC
  Administered 2016-01-02: 25 mg via INTRAVENOUS
  Filled 2016-01-02: qty 1

## 2016-01-02 MED ORDER — FAMOTIDINE IN NACL 20-0.9 MG/50ML-% IV SOLN
INTRAVENOUS | Status: AC
  Administered 2016-01-02: 20 mg via INTRAVENOUS
  Filled 2016-01-02: qty 50

## 2016-01-02 MED ORDER — LABETALOL HCL 5 MG/ML IV SOLN
INTRAVENOUS | Status: AC
  Filled 2016-01-02: qty 4

## 2016-01-02 MED ORDER — FENTANYL CITRATE (PF) 100 MCG/2ML IJ SOLN
INTRAMUSCULAR | Status: AC
  Filled 2016-01-02: qty 2

## 2016-01-02 SURGICAL SUPPLY — 19 items
BALLN ARMADA 5X40X80 (BALLOONS) ×3
BALLOON ARMADA 5X40X80 (BALLOONS) ×2 IMPLANT
CATH ANGIO 5F PIGTAIL 100CM (CATHETERS) ×3 IMPLANT
CATH CROSS OVER TEMPO 5F (CATHETERS) ×3 IMPLANT
CATH STRAIGHT 5FR 65CM (CATHETERS) ×3 IMPLANT
COVER PRB 48X5XTLSCP FOLD TPE (BAG) ×2 IMPLANT
COVER PROBE 5X48 (BAG) ×1
DEVICE CONTINUOUS FLUSH (MISCELLANEOUS) ×3 IMPLANT
KIT ENCORE 26 ADVANTAGE (KITS) ×3 IMPLANT
KIT MICROINTRODUCER STIFF 5F (SHEATH) ×3 IMPLANT
KIT PV (KITS) ×3 IMPLANT
SHEATH PINNACLE 5F 10CM (SHEATH) ×3 IMPLANT
SHEATH PINNACLE ST 6F 45CM (SHEATH) ×3 IMPLANT
STENT ABSOLUTE PRO 6X60X135 (Permanent Stent) ×3 IMPLANT
SYR MEDRAD MARK V 150ML (SYRINGE) ×3 IMPLANT
TRANSDUCER W/STOPCOCK (MISCELLANEOUS) ×3 IMPLANT
TRAY PV CATH (CUSTOM PROCEDURE TRAY) ×3 IMPLANT
WIRE BENTSON .035X145CM (WIRE) ×3 IMPLANT
WIRE HI TORQ VERSACORE J 260CM (WIRE) ×3 IMPLANT

## 2016-01-02 NOTE — Discharge Instructions (Signed)
Angiogram, Care After °Refer to this sheet in the next few weeks. These instructions provide you with information about caring for yourself after your procedure. Your health care provider may also give you more specific instructions. Your treatment has been planned according to current medical practices, but problems sometimes occur. Call your health care provider if you have any problems or questions after your procedure. °WHAT TO EXPECT AFTER THE PROCEDURE °After your procedure, it is typical to have the following: °· Bruising at the catheter insertion site that usually fades within 1-2 weeks. °· Blood collecting in the tissue (hematoma) that may be painful to the touch. It should usually decrease in size and tenderness within 1-2 weeks. °HOME CARE INSTRUCTIONS °· Take medicines only as directed by your health care provider. °· You may shower 24-48 hours after the procedure or as directed by your health care provider. Remove the bandage (dressing) and gently wash the site with plain soap and water. Pat the area dry with a clean towel. Do not rub the site, because this may cause bleeding. °· Do not take baths, swim, or use a hot tub until your health care provider approves. °· Check your insertion site every day for redness, swelling, or drainage. °· Do not apply powder or lotion to the site. °· Do not lift over 10 lb (4.5 kg) for 5 days after your procedure or as directed by your health care provider. °· Ask your health care provider when it is okay to: °¨ Return to work or school. °¨ Resume usual physical activities or sports. °¨ Resume sexual activity. °· Do not drive home if you are discharged the same day as the procedure. Have someone else drive you. °· You may drive 24 hours after the procedure unless otherwise instructed by your health care provider. °· Do not operate machinery or power tools for 24 hours after the procedure or as directed by your health care provider. °· If your procedure was done as an  outpatient procedure, which means that you went home the same day as your procedure, a responsible adult should be with you for the first 24 hours after you arrive home. °· Keep all follow-up visits as directed by your health care provider. This is important. °SEEK MEDICAL CARE IF: °· You have a fever. °· You have chills. °· You have increased bleeding from the catheter insertion site. Hold pressure on the site.  CALL 911 °SEEK IMMEDIATE MEDICAL CARE IF: °· You have unusual pain at the catheter insertion site. °· You have redness, warmth, or swelling at the catheter insertion site. °· You have drainage (other than a small amount of blood on the dressing) from the catheter insertion site. °· The catheter insertion site is bleeding, and the bleeding does not stop after 30 minutes of holding steady pressure on the site. °· The area near or just beyond the catheter insertion site becomes pale, cool, tingly, or numb. °  °This information is not intended to replace advice given to you by your health care provider. Make sure you discuss any questions you have with your health care provider. °  °Document Released: 04/19/2005 Document Revised: 10/22/2014 Document Reviewed: 03/04/2013 °Elsevier Interactive Patient Education ©2016 [employer] ° °

## 2016-01-02 NOTE — H&P (View-Only) (Signed)
[facility]  Patient name: Sherri Brooks MRN: [id number] DOB: [birth year] Sex: female  REASON FOR VISIT: Nonhealing wound of the left foot.  HPI: Sherri Brooks is a 70 y.o. female was had multiple attempts at revascularization of the left lower extremity. She has diffuse multilevel disease. She has undergone previous stenting of the left external iliac artery back in 2013. She originally underwent a left femoral to below-knee popliteal artery bypass with a PTFE graft and a left transmetatarsal amputation. The great saphenous vein was not adequate for a bypass conduit. She said only presented with an occluded graft and underwent a redo bypass. This was on 11/04/2012. This also occluded. However when she was last seen she had no open wounds on the transmetatarsal amputation site and her symptoms were tolerable.  She has now developed a wound on the lateral aspect of her transmetatarsal amputation site and is sent for evaluation for revascularization. She has rest pain in the left foot. She has claudication at short distance and both lower extremities. She does continue to smoke 2 cigarettes a day.  Past Medical History  Diagnosis Date  . Diabetes mellitus   . Hypertension   . Hyperlipidemia   . PVD (peripheral vascular disease) ([place])   . Cellulitis of buttock, left 2010  . Arthritis   . GERD (gastroesophageal reflux disease)   . CAD (coronary artery disease)   . MI (myocardial infarction) ([place])   . Wears glasses     Family History  Problem Relation Age of Onset  . Brain cancer Father   . Cancer Father   . Diabetes Mother     many family members  . Alzheimer's disease Mother   . Heart disease      SOCIAL HISTORY: Social History  Substance Use Topics  . Smoking status: Light Tobacco Smoker -- 0.40 packs/day for 20 years    Types: Cigarettes    Start date: 11/04/2012  . Smokeless tobacco: Never Used  . Alcohol Use: No    Allergies  Allergen  Reactions  . Iohexol      Code: HIVES, Desc: PT STATES SHE EXPERIENCED ITCHING W/ IV DYE IN PAST-ARS 01/21/10, Onset Date: [id number]     Current Outpatient Prescriptions  Medication Sig Dispense Refill  . albuterol (PROVENTIL HFA;VENTOLIN HFA) 108 (90 BASE) MCG/ACT inhaler Inhale 2 puffs into the lungs every 6 (six) hours as needed. For bronchitis and coughing    . alendronate (FOSAMAX) 70 MG tablet Take 70 mg by mouth every 7 (seven) days. On Mondays    . aspirin 325 MG EC tablet Take 325 mg by mouth daily.      . clopidogrel (PLAVIX) 75 MG tablet Take 75 mg by mouth daily.      . hydrochlorothiazide (HYDRODIURIL) 25 MG tablet Take 25 mg by mouth daily.    . insulin glargine (LANTUS) 100 UNIT/ML injection Inject 10 Units into the skin at bedtime.    Marland Kitchen labetalol (NORMODYNE) 200 MG tablet Take 200 mg by mouth 2 (two) times daily.      Marland Kitchen LORazepam (ATIVAN) 0.5 MG tablet Take 0.5 mg by mouth at bedtime.     . Multiple Vitamin (MULITIVITAMIN WITH MINERALS) TABS Take 1 tablet by mouth daily.    . simvastatin (ZOCOR) 40 MG tablet Take 40 mg by mouth at bedtime.     Marland Kitchen HYDROcodone-acetaminophen (NORCO) 5-325 MG per tablet Take 1 tablet by mouth every 6 (six) hours as needed for pain. (Patient  not taking: Reported on 10/05/2015) 30 tablet 0  . oxyCODONE-acetaminophen (PERCOCET/ROXICET) 5-325 MG tablet Take 1 tablet by mouth every 4 (four) hours as needed for severe pain. 30 tablet 0   No current facility-administered medications for this visit.    REVIEW OF SYSTEMS:  '[X]'$  denotes positive finding, '[ ]'$  denotes negative finding Cardiac  Comments:  Chest pain or chest pressure:    Shortness of breath upon exertion:    Short of breath when lying flat:    Irregular heart rhythm:        Vascular    Pain in calf, thigh, or hip brought on by ambulation:    Pain in feet at night that wakes you up from your sleep:     Blood clot in your veins:    Leg swelling:         Pulmonary    Oxygen at home:      Productive cough:     Wheezing:         Neurologic    Sudden weakness in arms or legs:     Sudden numbness in arms or legs:     Sudden onset of difficulty speaking or slurred speech:    Temporary loss of vision in one eye:     Problems with dizziness:         Gastrointestinal    Blood in stool:     Vomited blood:         Genitourinary    Burning when urinating:     Blood in urine:        Psychiatric    Major depression:         Hematologic    Bleeding problems:    Problems with blood clotting too easily:        Skin    Rashes or ulcers:        Constitutional    Fever or chills:      PHYSICAL EXAM: Filed Vitals:   12/23/15 1054 12/23/15 1058  BP: 185/55 191/65  Pulse: 80   Height: '5\' 3"'$  (1.6 m)   Weight: 146 lb 12.8 oz (66.588 kg)   SpO2: 100%     GENERAL: The patient is a well-nourished female, in no acute distress. The vital signs are documented above. CARDIAC: There is a regular rate and rhythm.  VASCULAR: I do not detect carotid bruits. She has a palpable right femoral pulse. I cannot palpate a left femoral pulse. I cannot palpate popliteal or pedal pulses on either side. He has no significant lower extremity swelling. PULMONARY: There is good air exchange bilaterally without wheezing or rales. ABDOMEN: Soft and non-tender with normal pitched bowel sounds.  MUSCULOSKELETAL: She has a transmetatarsal amputation on the left. NEUROLOGIC: No focal weakness or paresthesias are detected. SKIN: There is a superficial ulceration on the lateral aspect of her left transmetatarsal amputation that measures 1 cm x 1.5 cm. PSYCHIATRIC: The patient has a normal affect.  DATA:  She has a monophasic left peroneal signal with the Doppler only. She has a monophasic right peroneal, anterior tibial, and posterior tibial signal.  MEDICAL ISSUES:  NONHEALING WOUND LEFT TRANSMETATARSAL AMPUTATION: This patient has a nonhealing wound of the left lower extremity with evidence  of multilevel arterial occlusive disease. She has diffuse disease and has had multiple attempts at revascularization as noted above. Her options are very limited at this point. However, I think the only potential chance for limb salvage would be to proceed with arteriography and  evaluate her options for revascularization. She does not have a left femoral pulse and does not have great inflow on the right so may need to be considered for an axillobifemoral bypass graft. I will study the arch in addition to her infrarenal aorta and lower extremity runoff.  I have reviewed with the patient the indications for arteriography. In addition, I have reviewed the potential complications of arteriography including but not limited to: Bleeding, arterial injury, arterial thrombosis, dye action, renal insufficiency, or other unpredictable medical problems. I have explained to the patient that if we find disease amenable to angioplasty we could potentially address this at the same time. I have discussed the potential complications of angioplasty and stenting, including but not limited to: Bleeding, arterial thrombosis, arterial injury, dissection, or the need for surgical intervention.   We have also had a long discussion about the importance of tobacco cessation. Given that she may not have any further options for revascularization this is especially important at this point. Currently the wound on her left transmetatarsal amputation site is fairly superficial and perhaps she could squeak by. However she continues to smoke clearly this is not possible. The other consideration might be hyperbaric oxygen if she does not have any options for revascularization. I will make further recommendations pending the results of her arteriogram which is scheduled for 01/02/2016. No Follow-up on file.   Deitra Mayo [facility] [place]: [phone]

## 2016-01-02 NOTE — Interval H&P Note (Signed)
History and Physical Interval Note:  01/02/2016 9:52 AM  Sherri Brooks  has presented today for surgery, with the diagnosis of pvd with left foot ulcer  The various methods of treatment have been discussed with the patient and family. After consideration of risks, benefits and other options for treatment, the patient has consented to  Procedure(s): Abdominal Aortogram w/Lower Extremity (N/A) Aortic Arch Angiography (N/A) as a surgical intervention .  The patient's history has been reviewed, patient examined, no change in status, stable for surgery.  I have reviewed the patient's chart and labs.  Questions were answered to the patient's satisfaction.     Deitra Mayo

## 2016-01-02 NOTE — Op Note (Signed)
PATIENT: Sherri Brooks   MRN: [id number] DOB: 07/03/1946    DATE OF PROCEDURE: 01/02/2016  INDICATIONS: Sherri Brooks is a 70 y.o. female presents with a nonhealing wound on her left transmetatarsal amputation. This had multiple previous vascular procedures including bilateral iliac stents and 2 bypasses on her left leg. She has few options remaining for revascularization however I was concerned about her inflow on the left and she presents for arteriography and possible angioplasty and stenting.  PROCEDURE:  1. Ultrasound-guided access to the right common femoral artery 2. Aortogram with bilateral iliac arteriogram 3. Selective catheterization of the left external iliac artery 4. Angioplasty and stenting of the left external iliac artery  SURGEON: Judeth Cornfield. Scot Dock, MD, FACS  ANESTHESIA: local with sedation   EBL: minimal  TECHNIQUE: The patient was taken to the peripheral vascular lab. The period of conscious sedation began at (10:29 AM) and ended at (11:21 AM).  During that time period, I was present face-to-face 100% of the time.  The patient was administered (1 mg of Versed and 50 g of fentanyl.). The patient's heart rate, blood pressure, and oxygen saturation were monitored by the nurse continuously during the procedure.  Both groins were prepped and draped in usual sterile fashion. Under ultrasound guidance, after the skin was anesthetized, the right common femoral artery was cannulated with a micropuncture needle and a micropuncture sheath introduced over a wire. This was exchanged for a 5 [place] sheath over a [employer] wire. The pigtail catheter was positioned at the L1 vertebral body and flush aortogram obtained. The catheter was in position above the aortic bifurcation and oblique iliac projection was obtained. There was an in-stent stenosis in the left external iliac artery and also a stenosis below the stent in the left external iliac artery. I elected to address this with  balloon angioplasty. The 5 French sheath was exchanged for a long 6 [place] sheath which was passed over the bifurcation. The patient was then heparinized. Once the ACT was therapeutic and a 6 mm x 60 mm self-expanding stent was selected. This was advanced through the sheath over the wire encompassing both stenoses both the in-stent stenosis and the stenosis distal to the stent. This was positioned just above the inguinal ligament. This was deployed without difficulty. Went back with a 5 mm x 4 cm balloon. This was advanced to the distal stent where it was inflated to 12 atm for 1 minute. 2 additional inflations were made throughout the stented area. Completion films showed no residual stenosis. Left lower extreme runoff film was obtained. The common femoral and deep femoral artery are patent. The superficial femoral artery is occluded with severe diffuse disease. There is reconstitution of the below-knee popliteal artery with single-vessel runoff via the peroneal artery.  The sheath was retracted into the right external iliac artery. The patient was transferred to the holding area for removal of the sheath.   FINDINGS:  1. There are single renal arteries bilaterally with no significant renal artery stenosis. The infrarenal aorta is widely patent. 2. On the right side the right common iliac artery stent is patent. There is mild diffuse disease throughout the external iliac artery on the right which is small. The remainder of the right lower extreme was not studied because of her elevated creatinine. 3. On the left side, the common iliac artery is patent. Air is an in-stent stenosis of a proximal 70% in the left external iliac artery. There is an 80% stenosis just beyond the  stent. These were both successfully ballooned and stented as described above. Below that common femoral and deep femoral artery are patent. The superficial femoral artery has severe diffuse disease with single-vessel runoff on the left  via the peroneal artery.  PRE:  Tandem 70% and 80% external iliac artery stenoses on the left POST: no residual stenosis. STENT: 6 mm x 60 mm self-expanding stent  Sherri Mayo, MD, FACS [facility] [place]  DATE OF DICTATION:   01/02/2016

## 2016-01-02 NOTE — Progress Notes (Addendum)
Site area: RFA Site Prior to Removal:  Level 0 Pressure Applied For:20 min Manual:yes    Patient Status During Pull:  stable Post Pull Site:  Level 0 Post Pull Instructions Given:yes   Post Pull Pulses Present: doppler Dressing Applied:  tegaderm Bedrest begins @ 4888 till 1745 Comments:

## 2016-01-03 ENCOUNTER — Ambulatory Visit (HOSPITAL_COMMUNITY): Payer: Medicare Other | Admitting: Physical Therapy

## 2016-01-03 ENCOUNTER — Encounter (HOSPITAL_COMMUNITY): Payer: Self-pay | Admitting: Vascular Surgery

## 2016-01-03 MED FILL — Lidocaine HCl Local Preservative Free (PF) Inj 1%: INTRAMUSCULAR | Qty: 30 | Status: AC

## 2016-01-04 ENCOUNTER — Telehealth: Payer: Self-pay | Admitting: Vascular Surgery

## 2016-01-04 NOTE — Telephone Encounter (Signed)
-----   Message from Mena Goes, RN sent at 01/02/2016  1:00 PM EDT ----- Regarding: schedule   ----- Message -----    From: Angelia Mould, MD    Sent: 01/02/2016  11:43 AM      To: Vvs Charge Pool Subject: charge                                         PROCEDURE:  1. Ultrasound-guided access to the right common femoral artery 2. Aortogram with bilateral iliac arteriogram 3. Selective catheterization of the left external iliac artery 4. Angioplasty and stenting of the left external iliac artery  SURGEON: Judeth Cornfield. Scot Dock, MD, FACS  ANESTHESIA: local with sedation   She will need a follow up visit in 3-4 weeks to check on her wound on her left transmetatarsal amputation. Thank you. CD

## 2016-01-04 NOTE — Telephone Encounter (Signed)
Spoke with pt, dpm °

## 2016-01-05 ENCOUNTER — Ambulatory Visit (HOSPITAL_COMMUNITY): Payer: Medicare HMO | Admitting: Physical Therapy

## 2016-01-05 DIAGNOSIS — L97421 Non-pressure chronic ulcer of left heel and midfoot limited to breakdown of skin: Secondary | ICD-10-CM | POA: Diagnosis not present

## 2016-01-05 DIAGNOSIS — M79672 Pain in left foot: Secondary | ICD-10-CM

## 2016-01-05 DIAGNOSIS — R2681 Unsteadiness on feet: Secondary | ICD-10-CM

## 2016-01-05 DIAGNOSIS — L97423 Non-pressure chronic ulcer of left heel and midfoot with necrosis of muscle: Secondary | ICD-10-CM | POA: Diagnosis not present

## 2016-01-05 DIAGNOSIS — T148XXA Other injury of unspecified body region, initial encounter: Secondary | ICD-10-CM

## 2016-01-05 DIAGNOSIS — I70334 Atherosclerosis of unspecified type of bypass graft(s) of the right leg with ulceration of heel and midfoot: Secondary | ICD-10-CM

## 2016-01-05 DIAGNOSIS — R262 Difficulty in walking, not elsewhere classified: Secondary | ICD-10-CM | POA: Diagnosis not present

## 2016-01-05 DIAGNOSIS — T148 Other injury of unspecified body region: Secondary | ICD-10-CM | POA: Diagnosis not present

## 2016-01-05 NOTE — Therapy (Addendum)
[facility] [place], [place], [address] Phone: [phone]   Fax:  [phone]  Wound Care Therapy  Patient Details  Name: Sherri Brooks MRN: [id number] Date of Birth: Jul 04, 1946 No Data Recorded  Encounter Date: 01/05/2016      PT End of Session - 01/05/16 1633    Visit Number 8   Number of Visits 24   Date for PT Re-Evaluation 02/04/16   Authorization Type [employer] medicare   Authorization Time Period g code and reasses done at visit 6    Authorization - Visit Number 8   Authorization - Number of Visits 16   PT Start Time 7510   PT Stop Time 1515   PT Time Calculation (min) 43 min      Past Medical History  Diagnosis Date  . Diabetes mellitus   . Hypertension   . Hyperlipidemia   . PVD (peripheral vascular disease) ([place])   . Cellulitis of buttock, left 2010  . Arthritis   . GERD (gastroesophageal reflux disease)   . CAD (coronary artery disease)   . MI (myocardial infarction) ([place])   . Wears glasses     Past Surgical History  Procedure Laterality Date  . Femoral-popliteal bypass graft  07/2010    pt has had 3 fem-pop bpg  . Right common iliac stent  10/2009    Dr Doren Custard ([place])  . Tonsillectomy    . S/p hysterecotmy      partial  . Foot amputation  2010    left  . Colonoscopy  10/26/2011    Procedure: COLONOSCOPY;  Surgeon: Dorothyann Peng, MD;  [facility];  Service: Endoscopy;  Laterality: N/A;  1:30  . Femoral-popliteal bypass graft  11/04/2012    Procedure: BYPASS GRAFT FEMORAL-POPLITEAL ARTERY;  Surgeon: Angelia Mould, MD;  Location: [facility] OR;  Service: Vascular;  Laterality: Left;  Redo Left Femoral Popliteal Bypass with PTFE  . Orif ankle fracture Left 01/27/2013    Procedure: OPEN REDUCTION INTERNAL FIXATION (ORIF) ANKLE FRACTURE;  Surgeon: Wylene Simmer, MD;  Location: [facility];  Service: Orthopedics;  Laterality: Left;  . Abdominal aortagram N/A 07/28/2012    Procedure:  ABDOMINAL Maxcine Ham;  Surgeon: Angelia Mould, MD;  Location: [facility] CATH LAB;  Service: Cardiovascular;  Laterality: N/A;  . Lower extremity angiogram Bilateral 07/28/2012    Procedure: LOWER EXTREMITY ANGIOGRAM;  Surgeon: Angelia Mould, MD;  Location: [facility] CATH LAB;  Service: Cardiovascular;  Laterality: Bilateral;  . Percutaneous stent intervention Left 07/28/2012    Procedure: PERCUTANEOUS STENT INTERVENTION;  Surgeon: Angelia Mould, MD;  Location: [facility] CATH LAB;  Service: Cardiovascular;  Laterality: Left;  lt ext tiliac stentx1  . Peripheral vascular catheterization N/A 01/02/2016    Procedure: Abdominal Aortogram;  Surgeon: Angelia Mould, MD;  Location: [facility] CV LAB;  Service: Cardiovascular;  Laterality: N/A;  . Lower extremity angiogram Left 01/02/2016    Procedure: Lower Extremity Angiogram;  Surgeon: Angelia Mould, MD;  Location: [place] CV LAB;  Service: Cardiovascular;  Laterality: Left;  . Peripheral vascular catheterization Left 01/02/2016    Procedure: Peripheral Vascular Intervention;  Surgeon: Angelia Mould, MD;  Location: [place] CV LAB;  Service: Cardiovascular;  Laterality: Left;  lt ext iliac    There were no vitals filed for this visit.  Visit Diagnosis:  Nonhealing nonsurgical wound       01/05/16 1616  Subjective Assessment  Subjective Pt states that she  had another stent placed in her leg and she now has 80% blood flow.   Patient and Family Stated Goals wound to heal   Date of Onset 11/05/15  Prior Treatments self care  Pain Assessment  Pain Assessment 0-10  Pain Score 3  Pain Type Chronic pain  Pain Location Foot  Pain Orientation Left;Anterior  Patients Stated Pain Goal 0  Multiple Pain Sites Yes  2nd Pain Site  Pain Score 6  Pain Type Chronic pain  Pain Location Heel  Pain Orientation Left  Pain Frequency Intermittent  Pain Onset On-going  Patient's Stated Pain Goal 2  Pain Intervention(s)  Emotional support  Evaluation and Treatment  Evaluation and Treatment Procedures Explained to Patient/Family Yes  Evaluation and Treatment Procedures agreed to  Wound / Incision (Open or Dehisced) 11/10/15 Other (Comment) Foot Left lateral aspect of transmetatarsal ampuation   Date First Assessed/Time First Assessed: 11/10/15 1455   Wound Type: Other (Comment)  Location: Foot  Location Orientation: Left  Wound Description (Comments): lateral aspect of transmetatarsal ampuation   Dressing Type Gauze (Comment) (with medihoney)  Dressing Status Old drainage  Dressing Change Frequency (Pt has not changed dressing in two weeks )  Site / Wound Assessment Dry;Red;Yellow  % Wound base Red or Granulating 25%  % Wound base Yellow 75%  Peri-wound Assessment Intact  Wound Length (cm) 2.5 cm (was 1.9)  Wound Width (cm) 1.5 cm (was .9)  Wound Depth (cm) 0.3 cm (was .3)  Margins Epibole (rolled edges)  Drainage Amount Scant  Drainage Description Serous  Treatment Cleansed;Debridement (Selective)  Wound / Incision (Open or Dehisced) 01/05/16 Other (Comment) Heel Left  Date First Assessed/Time First Assessed: 01/05/16 1435   Wound Type: Other (Comment)  Location: Heel  Location Orientation: Left  Present on Admission: Yes  Dressing Type Gauze (Comment)  Dressing Changed Changed  Dressing Status Clean;Old drainage  Dressing Change Frequency Every 3 days  Site / Wound Assessment Brown;Friable  % Wound base Red or Granulating 0%  % Wound base Other (Comment) 100% (grey necotic tissue )  Peri-wound Assessment Other (Comment) (mushy)  Wound Length (cm) 1.2 cm  Wound Width (cm) 0.9 cm  Wound Depth (cm) (unknown but at least 1.5 )  Undermining (cm) (at least.5 )  Margins Unattached edges (unapproximated)  Closure None  Drainage Amount Minimal  Treatment Cleansed;Debridement (Selective)  Selective Debridement  Selective Debridement - Location edges of wound bed   Selective Debridement - Tools  Used Forceps;Scissors  Selective Debridement - Tissue Removed necrotic tissue off heel, slough, callous   Wound Therapy - Assess/Plan/Recommendations  Wound Therapy - Clinical Statement Pt wounds have continually increased in size due to the fact that her artery was blocked.  She just had a stent placed in her leg on 01/03/2016 which will hopefully assist in her wound healing.  She now has a second wound on the heel.  It is necrotic and the tissue surrounding it feel mushy, this may be very deep.  Pt will need to continue wound care two times a week for the next 8 weeks for both wounds.  Wound Therapy - Functional Problem List increased pain with increased walking .  Factors Delaying/Impairing Wound Healing Altered sensation;Diabetes Mellitus;Multiple medical problems;Polypharmacy;Vascular compromise  Hydrotherapy Plan Debridement;Dressing change  Wound Therapy - Frequency (PT to be seen two time a week for the next 8 weeks)  Wound Therapy - Current Recommendations PT (vascular surgeon )  Wound Therapy  Dressing  medihoney to wound; vaseling to periphery;  heel with silver to wound and vaseline to periphary followed by 4.4 kling and netting.  Wound Therapy Goals - Improve the function of patient's integumentary system by progressing the wound(s) through the phases of wound healing by:  Decrease Necrotic Tissue to STG:  2 weeks 25% : LTG 4 weeks 0%  Decrease Necrotic Tissue - Progress Not met  Increase Granulation Tissue to STG: 2 weeks 75%; LTG 4 weeks 25%  Increase Granulation Tissue - Progress Not met  Decrease Length/Width/Depth by (cm) LTG: 4 weeks 1x.25x.2  Decrease Length/Width/Depth - Progress Not met  Additional Wound Therapy Goal STG: Pain to decrease to 4/10:  LTG 4 weeks pain to decrease to 2/10   Additional Wound Therapy Goal - Progress Progressing toward goal  Goals/treatment plan/discharge plan were made with and agreed upon by patient/family Yes  Time For Goal Achievement (2 months  )  Wound Therapy - Potential for Goals Fair                Wound Therapy - 01/05/16 1623    Wound Properties Date First Assessed: 11/10/15 Time First Assessed: 1455 Wound Type: Other (Comment) Location: Foot Location Orientation: Left Wound Description (Comments): lateral aspect of transmetatarsal ampuation    Wound Properties Date First Assessed: 01/05/16 Time First Assessed: 1435 Wound Type: Other (Comment) Location: Heel Location Orientation: Left Present on Admission: Yes   Decrease Necrotic Tissue - Progress -new wound on heel    Increase Granulation Tissue - Progress --slowly improving on anterior wound but new wound on heel    Decrease Length/Width/Depth - Progress --not progressing should begin to progress now that stent has been placed               Problem List Patient Active Problem List   Diagnosis Date Noted  . Ankle fracture, bimalleolar, closed 12/29/2012  . Anemia of unknown etiology 11/06/2012  . Atherosclerotic PVD with ulceration ([place]) 11/04/2012  . Type II or unspecified type diabetes mellitus with peripheral circulatory disorders, uncontrolled(250.72) 11/04/2012  . Tobacco use disorder 11/04/2012  . Open wound of left foot 11/04/2012  . Atherosclerosis of native arteries of the extremities with ulceration(440.23) 07/09/2012  . Weight loss, unintentional 09/18/2011  . Anorexia 09/18/2011  . Early satiety 09/18/2011  . 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/07/2011    [name], PT CLT [phone] 01/05/2016, 4:36 PM  [facility] [address] [place], [place], [address] Phone: [phone]   Fax:  [phone]  Name: ESSENSE BOUSQUET MRN: [id number] Date of Birth: 04-May-1946

## 2016-01-10 ENCOUNTER — Ambulatory Visit (HOSPITAL_COMMUNITY): Payer: Medicare HMO

## 2016-01-10 DIAGNOSIS — I70334 Atherosclerosis of unspecified type of bypass graft(s) of the right leg with ulceration of heel and midfoot: Secondary | ICD-10-CM

## 2016-01-10 DIAGNOSIS — L97423 Non-pressure chronic ulcer of left heel and midfoot with necrosis of muscle: Secondary | ICD-10-CM | POA: Diagnosis not present

## 2016-01-10 DIAGNOSIS — M79672 Pain in left foot: Secondary | ICD-10-CM

## 2016-01-10 DIAGNOSIS — T148 Other injury of unspecified body region: Secondary | ICD-10-CM | POA: Diagnosis not present

## 2016-01-10 DIAGNOSIS — R262 Difficulty in walking, not elsewhere classified: Secondary | ICD-10-CM | POA: Diagnosis not present

## 2016-01-10 DIAGNOSIS — L97421 Non-pressure chronic ulcer of left heel and midfoot limited to breakdown of skin: Secondary | ICD-10-CM | POA: Diagnosis not present

## 2016-01-10 DIAGNOSIS — R2681 Unsteadiness on feet: Secondary | ICD-10-CM

## 2016-01-10 NOTE — Therapy (Signed)
[place], [place], [address] Phone: [phone]   Fax:  [phone]  Wound Care Therapy  Patient Details  Name: Sherri Brooks MRN: [id number] Date of Birth: [birth year] No Data Recorded  Encounter Date: 01/10/2016      PT End of Session - 01/10/16 1604    Visit Number 9   Number of Visits 24   Date for PT Re-Evaluation 02/04/16   Authorization Type UHC medicare   Authorization Time Period g code and reasses done at visit 6    Authorization - Visit Number 9   Authorization - Number of Visits 16   PT Start Time 1528   PT Stop Time 1600   PT Time Calculation (min) 32 min      Past Medical History  Diagnosis Date  . Diabetes mellitus   . Hypertension   . Hyperlipidemia   . PVD (peripheral vascular disease) ([place])   . Cellulitis of buttock, left 2010  . Arthritis   . GERD (gastroesophageal reflux disease)   . CAD (coronary artery disease)   . MI (myocardial infarction) ([place])   . Wears glasses     Past Surgical History  Procedure Laterality Date  . Femoral-popliteal bypass graft  07/2010    pt has had 3 fem-pop bpg  . Right common iliac stent  10/2009    Dr Doren Custard ([place])  . Tonsillectomy    . S/p hysterecotmy      partial  . Foot amputation  2010    left  . Colonoscopy  10/26/2011    Procedure: COLONOSCOPY;  Surgeon: Dorothyann Peng, MD;  [facility];  Service: Endoscopy;  Laterality: N/A;  1:30  . Femoral-popliteal bypass graft  11/04/2012    Procedure: BYPASS GRAFT FEMORAL-POPLITEAL ARTERY;  Surgeon: Angelia Mould, MD;  Location: [facility] OR;  Service: Vascular;  Laterality: Left;  Redo Left Femoral Popliteal Bypass with PTFE  . Orif ankle fracture Left 01/27/2013    Procedure: OPEN REDUCTION INTERNAL FIXATION (ORIF) ANKLE FRACTURE;  Surgeon: Wylene Simmer, MD;  Location: [place];  Service: Orthopedics;  Laterality: Left;  . Abdominal aortagram N/A 07/28/2012    Procedure:  ABDOMINAL Maxcine Ham;  Surgeon: Angelia Mould, MD;  Location: [facility] CATH LAB;  Service: Cardiovascular;  Laterality: N/A;  . Lower extremity angiogram Bilateral 07/28/2012    Procedure: LOWER EXTREMITY ANGIOGRAM;  Surgeon: Angelia Mould, MD;  Location: [facility] CATH LAB;  Service: Cardiovascular;  Laterality: Bilateral;  . Percutaneous stent intervention Left 07/28/2012    Procedure: PERCUTANEOUS STENT INTERVENTION;  Surgeon: Angelia Mould, MD;  Location: [facility] CATH LAB;  Service: Cardiovascular;  Laterality: Left;  lt ext tiliac stentx1  . Peripheral vascular catheterization N/A 01/02/2016    Procedure: Abdominal Aortogram;  Surgeon: Angelia Mould, MD;  Location: [place] CV LAB;  Service: Cardiovascular;  Laterality: N/A;  . Lower extremity angiogram Left 01/02/2016    Procedure: Lower Extremity Angiogram;  Surgeon: Angelia Mould, MD;  Location: [place] CV LAB;  Service: Cardiovascular;  Laterality: Left;  . Peripheral vascular catheterization Left 01/02/2016    Procedure: Peripheral Vascular Intervention;  Surgeon: Angelia Mould, MD;  Location: [place] CV LAB;  Service: Cardiovascular;  Laterality: Left;  lt ext iliac    There were no vitals filed for this visit.  Visit Diagnosis:  Nonhealing nonsurgical wound      Subjective Assessment - 01/10/16 1602    Subjective Pt stated she  had stent put in thigh last week, reports increased drainage today.  Current pain scale 5/10   Currently in Pain? Yes   Pain Score 5    Pain Location Foot   Pain Orientation Left;Anterior;Posterior   Pain Descriptors / Indicators Aching   Pain Type Chronic pain            Wound Therapy - 01/10/16 1602    Subjective Pt stated she had stent put in thigh last week, reports increased drainage today.  Current pain scale 5/10   Patient and Family Stated Goals wound to heal    Date of Onset 11/05/15   Prior Treatments self care   Pain Assessment 0-10   Patients  Stated Pain Goal 0   Multiple Pain Sites Yes   Evaluation and Treatment Procedures Explained to Patient/Family Yes   Evaluation and Treatment Procedures agreed to   Wound Properties Date First Assessed: 01/05/16 Time First Assessed: 1435 Wound Type: Other (Comment) Location: Heel Location Orientation: Left Present on Admission: Yes   Dressing Type Silver dressings;Gauze (Comment)  Silver, vaseline perimeter, ABD pad and gauze   Dressing Changed Changed   Dressing Status Clean;Old drainage   Dressing Change Frequency Every 3 days   Site / Wound Assessment Brown;Friable   % Wound base Red or Granulating 0%   % Wound base Other (Comment) 100%  grey necrotic tissue   Peri-wound Assessment --  mushy   Margins Unattached edges (unapproximated)   Closure None   Drainage Amount Minimal   Treatment Cleansed;Debridement (Selective)   Wound Properties Date First Assessed: 11/10/15 Time First Assessed: 1455 Wound Type: Other (Comment) Location: Foot Location Orientation: Left Wound Description (Comments): lateral aspect of transmetatarsal ampuation    Dressing Type Gauze (Comment)  medihoney, vaseline perimeter and gauze   Dressing Changed Changed   Dressing Status Old drainage   Dressing Change Frequency Every 3 days   Site / Wound Assessment Dry;Red;Yellow   % Wound base Red or Granulating 25%   % Wound base Yellow 75%   Margins Epibole (rolled edges)   Drainage Amount Scant   Drainage Description Serous   Treatment Cleansed;Debridement (Selective)   Selective Debridement - Location edges of wound bed    Selective Debridement - Tools Used Forceps;Scalpel   Selective Debridement - Tissue Removed necrotic tissue off heel, slough, callous    Wound Therapy - Clinical Statement Increased drainage noted and reports of increased feeling of foot following additional stent last week. Continued selectived debridement for removal of necrotic tissue and slough to progress healing.  Added ABD pad to  heel for drainage and pt reports increase comfort upon standing.     Wound Therapy - Functional Problem List increased pain with increased walking .   Factors Delaying/Impairing Wound Healing Altered sensation;Diabetes Mellitus;Multiple medical problems;Polypharmacy;Vascular compromise   Hydrotherapy Plan Debridement;Dressing change   Wound Therapy - Frequency --  2x/ 8 weeks   Wound Therapy - Current Recommendations PT   Dressing  medihoney to MTP; silver to heel with ABD pad and gauze           Problem List Patient Active Problem List   Diagnosis Date Noted  . Ankle fracture, bimalleolar, closed 12/29/2012  . Anemia of unknown etiology 11/06/2012  . Atherosclerotic PVD with ulceration ([place]) 11/04/2012  . Type II or unspecified type diabetes mellitus with peripheral circulatory disorders, uncontrolled(250.72) 11/04/2012  . Tobacco use disorder 11/04/2012  . Open wound of left foot 11/04/2012  . Atherosclerosis of native arteries of  the extremities with ulceration(440.23) 07/09/2012  . Weight loss, unintentional 09/18/2011  . Anorexia 09/18/2011  . Early satiety 09/18/2011  . 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/07/2011   Ihor Austin, LPTA; CBIS [phone]  Aldona Lento 01/10/2016, 7:01 PM  [place], [place], [address] Phone: [phone]   Fax:  [phone]  Name: NATARSHA HURWITZ MRN: [id number] Date of Birth: 05/02/1946

## 2016-01-12 ENCOUNTER — Telehealth (HOSPITAL_COMMUNITY): Payer: Self-pay | Admitting: Physical Therapy

## 2016-01-12 ENCOUNTER — Ambulatory Visit (HOSPITAL_COMMUNITY): Payer: Medicare HMO | Admitting: Physical Therapy

## 2016-01-12 NOTE — Telephone Encounter (Signed)
Attempted to call pt re: missed appointment.  Pt did not answer and her mailbox was full; therefore no message was left.  [name], [place] CLT [phone]

## 2016-01-17 ENCOUNTER — Encounter: Payer: Self-pay | Admitting: Vascular Surgery

## 2016-01-18 ENCOUNTER — Ambulatory Visit (HOSPITAL_COMMUNITY): Payer: Medicare HMO | Attending: Family Medicine | Admitting: Physical Therapy

## 2016-01-18 DIAGNOSIS — L97423 Non-pressure chronic ulcer of left heel and midfoot with necrosis of muscle: Secondary | ICD-10-CM | POA: Diagnosis not present

## 2016-01-18 DIAGNOSIS — R2681 Unsteadiness on feet: Secondary | ICD-10-CM | POA: Diagnosis not present

## 2016-01-18 DIAGNOSIS — T148 Other injury of unspecified body region: Secondary | ICD-10-CM | POA: Diagnosis not present

## 2016-01-18 DIAGNOSIS — L97421 Non-pressure chronic ulcer of left heel and midfoot limited to breakdown of skin: Secondary | ICD-10-CM | POA: Insufficient documentation

## 2016-01-18 DIAGNOSIS — R262 Difficulty in walking, not elsewhere classified: Secondary | ICD-10-CM | POA: Diagnosis not present

## 2016-01-18 DIAGNOSIS — I70244 Atherosclerosis of native arteries of left leg with ulceration of heel and midfoot: Secondary | ICD-10-CM | POA: Diagnosis not present

## 2016-01-18 DIAGNOSIS — M79672 Pain in left foot: Secondary | ICD-10-CM | POA: Diagnosis not present

## 2016-01-18 DIAGNOSIS — I70334 Atherosclerosis of unspecified type of bypass graft(s) of the right leg with ulceration of heel and midfoot: Secondary | ICD-10-CM | POA: Diagnosis not present

## 2016-01-18 NOTE — Therapy (Signed)
[place], [place], [address] Phone: [phone]   Fax:  [phone]  Wound Care Therapy  Patient Details  Name: Sherri Brooks MRN: [id number] Date of Birth: 01-30-1946 No Data Recorded  Encounter Date: 01/18/2016      PT End of Session - 01/18/16 1236    Visit Number 10   Number of Visits 24   Date for PT Re-Evaluation 02/04/16   Authorization Time Period g code and reasses done at visit 6    Authorization - Visit Number 10   Authorization - Number of Visits 16   PT Start Time 0930   PT Stop Time 5929   PT Time Calculation (min) 44 min   Activity Tolerance Patient tolerated treatment well      Past Medical History  Diagnosis Date  . Diabetes mellitus   . Hypertension   . Hyperlipidemia   . PVD (peripheral vascular disease) ([place])   . Cellulitis of buttock, left 2010  . Arthritis   . GERD (gastroesophageal reflux disease)   . CAD (coronary artery disease)   . MI (myocardial infarction) ([place])   . Wears glasses     Past Surgical History  Procedure Laterality Date  . Femoral-popliteal bypass graft  07/2010    pt has had 3 fem-pop bpg  . Right common iliac stent  10/2009    Dr Doren Custard ([place])  . Tonsillectomy    . S/p hysterecotmy      partial  . Foot amputation  2010    left  . Colonoscopy  10/26/2011    Procedure: COLONOSCOPY;  Surgeon: Dorothyann Peng, MD;  [facility];  Service: Endoscopy;  Laterality: N/A;  1:30  . Femoral-popliteal bypass graft  11/04/2012    Procedure: BYPASS GRAFT FEMORAL-POPLITEAL ARTERY;  Surgeon: Angelia Mould, MD;  Location: [facility] OR;  Service: Vascular;  Laterality: Left;  Redo Left Femoral Popliteal Bypass with PTFE  . Orif ankle fracture Left 01/27/2013    Procedure: OPEN REDUCTION INTERNAL FIXATION (ORIF) ANKLE FRACTURE;  Surgeon: Wylene Simmer, MD;  Location: [place];  Service: Orthopedics;  Laterality: Left;  . Abdominal aortagram N/A  07/28/2012    Procedure: ABDOMINAL Maxcine Ham;  Surgeon: Angelia Mould, MD;  Location: [facility] CATH LAB;  Service: Cardiovascular;  Laterality: N/A;  . Lower extremity angiogram Bilateral 07/28/2012    Procedure: LOWER EXTREMITY ANGIOGRAM;  Surgeon: Angelia Mould, MD;  Location: [facility] CATH LAB;  Service: Cardiovascular;  Laterality: Bilateral;  . Percutaneous stent intervention Left 07/28/2012    Procedure: PERCUTANEOUS STENT INTERVENTION;  Surgeon: Angelia Mould, MD;  Location: [facility] C[facility] LAB;  Service: Cardiovascular;  Laterality: Left;  lt ext tiliac stentx1  . Peripheral vascular catheterization N/A 01/02/2016    Procedure: Abdominal Aortogram;  Surgeon: Angelia Mould, MD;  Location: [place] CV LAB;  Service: Cardiovascular;  Laterality: N/A;  . Lower extremity angiogram Left 01/02/2016    Procedure: Lower Extremity Angiogram;  Surgeon: Angelia Mould, MD;  Location: [place] CV LAB;  Service: Cardiovascular;  Laterality: Left;  . Peripheral vascular catheterization Left 01/02/2016    Procedure: Peripheral Vascular Intervention;  Surgeon: Angelia Mould, MD;  Location: [place] CV LAB;  Service: Cardiovascular;  Laterality: Left;  lt ext iliac    There were no vitals filed for this visit.  Visit Diagnosis:  Nonhealing nonsurgical wound  Difficulty walking  Midfoot ulceration, left, limited to breakdown of skin ([place])  Heel  ulceration, left, with necrosis of muscle ([facility])                 Wound Therapy - 01/18/16 1227    Subjective Pt states that her foot is feeling better.    Patient and Family Stated Goals wound to heal    Date of Onset 11/05/15   Prior Treatments self care   Pain Assessment 0-10   Pain Score 3    Pain Type Chronic pain   Pain Location Foot   Pain Orientation Left;Anterior  as well as heel    Patients Stated Pain Goal 0   Multiple Pain Sites Yes   Evaluation and Treatment Procedures Explained to  Patient/Family Yes   Evaluation and Treatment Procedures agreed to   Wound Properties Date First Assessed: 01/05/16 Time First Assessed: 1435 Wound Type: Other (Comment) Location: Heel Location Orientation: Left Present on Admission: Yes   Dressing Type Silver dressings;Gauze (Comment)  Silver, vaseline perimeter, ABD pad and gauze   Dressing Changed Changed   Dressing Status Clean;Old drainage   Dressing Change Frequency Every 3 days   Site / Wound Assessment Friable;Yellow   % Wound base Red or Granulating 0%   % Wound base Other (Comment) 100%  grey necrotic tissue   Peri-wound Assessment --  mushy   Margins Unattached edges (unapproximated)   Closure None   Drainage Amount Minimal   Treatment Cleansed;Debridement (Selective);Hydrotherapy (Pulse lavage)   Wound Properties Date First Assessed: 11/10/15 Time First Assessed: 1455 Wound Type: Other (Comment) Location: Foot Location Orientation: Left Wound Description (Comments): lateral aspect of transmetatarsal ampuation    Dressing Type Gauze (Comment)  medihoney, vaseline perimeter and gauze   Dressing Changed Changed   Dressing Status Old drainage   Dressing Change Frequency Every 3 days   Site / Wound Assessment Dry;Red;Yellow   % Wound base Red or Granulating 75%  after debridement    % Wound base Yellow 25%   Wound Length (cm) 2.5 cm   Wound Width (cm) 1.5 cm   Wound Depth (cm) 0.4 cm   Margins Epibole (rolled edges)   Drainage Amount Scant   Drainage Description Serous   Treatment Cleansed;Debridement (Selective)   Pulsed lavage therapy - wound location heel    Pulsed Lavage with Suction (psi) 4 psi   Pulsed Lavage with Suction - Normal Saline Used 1000 mL   Pulsed Lavage Tip Tip with splash shield   Selective Debridement - Location edges of wound bed    Selective Debridement - Tools Used Forceps;Scalpel   Selective Debridement - Tissue Removed epiboled edges of fore foot wound; necrotic tissue off heel, slough,  callous    Wound Therapy - Clinical Statement Pt continues to have increased drainage from wounds.  But noted increased granulation on forefoot wound after debridement.  Began pulse lavage to heel wound  which slightly assisted in loosening    Wound Therapy - Functional Problem List increased pain with increased walking .   Factors Delaying/Impairing Wound Healing Altered sensation;Diabetes Mellitus;Multiple medical problems;Polypharmacy;Vascular compromise   Hydrotherapy Plan Debridement;Dressing change   Wound Therapy - Frequency --  2x/ 8 weeks   Wound Therapy - Current Recommendations PT   Dressing  medihoney to MTP; silver to heel with 4x4  and gauze   Decrease Necrotic Tissue to STG:  2 weeks 25% : LTG 4 weeks 0%   Decrease Necrotic Tissue - Progress Partly met   Increase Granulation Tissue to STG: 2 weeks 75%; LTG 4 weeks 25%  Increase Granulation Tissue - Progress Partly met   Decrease Length/Width/Depth by (cm) LTG: 4 weeks 1x.25x.2   Decrease Length/Width/Depth - Progress Not met   Additional Wound Therapy Goal STG: Pain to decrease to 4/10:  LTG 4 weeks pain to decrease to 2/10    Additional Wound Therapy Goal - Progress Partly met   Goals/treatment plan/discharge plan were made with and agreed upon by patient/family Yes                              Problem List Patient Active Problem List   Diagnosis Date Noted  . Ankle fracture, bimalleolar, closed 12/29/2012  . Anemia of unknown etiology 11/06/2012  . Atherosclerotic PVD with ulceration ([place]) 11/04/2012  . Type II or unspecified type diabetes mellitus with peripheral circulatory disorders, uncontrolled(250.72) 11/04/2012  . Tobacco use disorder 11/04/2012  . Open wound of left foot 11/04/2012  . Atherosclerosis of native arteries of the extremities with ulceration(440.23) 07/09/2012  . Weight loss, unintentional 09/18/2011  . Anorexia 09/18/2011  . Early satiety 09/18/2011  . Contracture of  elbow joint 03/07/2011    [name], PT CLT [phone] 01/18/2016, 12:37 PM  [place] [address] [place], [place], [address] Phone: [phone]   Fax:  [phone]  Name: KINDALL SWABY MRN: [id number] Date of Birth: 1946-03-27

## 2016-01-24 ENCOUNTER — Ambulatory Visit (HOSPITAL_COMMUNITY): Payer: Medicare HMO

## 2016-01-24 DIAGNOSIS — R2681 Unsteadiness on feet: Secondary | ICD-10-CM

## 2016-01-24 DIAGNOSIS — I70334 Atherosclerosis of unspecified type of bypass graft(s) of the right leg with ulceration of heel and midfoot: Secondary | ICD-10-CM | POA: Diagnosis not present

## 2016-01-24 DIAGNOSIS — M79672 Pain in left foot: Secondary | ICD-10-CM

## 2016-01-24 DIAGNOSIS — L97421 Non-pressure chronic ulcer of left heel and midfoot limited to breakdown of skin: Secondary | ICD-10-CM | POA: Diagnosis not present

## 2016-01-24 DIAGNOSIS — I70244 Atherosclerosis of native arteries of left leg with ulceration of heel and midfoot: Secondary | ICD-10-CM | POA: Diagnosis not present

## 2016-01-24 DIAGNOSIS — T148 Other injury of unspecified body region: Secondary | ICD-10-CM | POA: Diagnosis not present

## 2016-01-24 DIAGNOSIS — L97423 Non-pressure chronic ulcer of left heel and midfoot with necrosis of muscle: Secondary | ICD-10-CM | POA: Diagnosis not present

## 2016-01-24 DIAGNOSIS — R262 Difficulty in walking, not elsewhere classified: Secondary | ICD-10-CM | POA: Diagnosis not present

## 2016-01-24 NOTE — Therapy (Signed)
[place], [place], [address] Phone: [phone]   Fax:  [phone]  Wound Care Therapy  Patient Details  Name: Sherri Brooks MRN: [id number] Date of Birth: 04/29/1946 No Data Recorded  Encounter Date: 01/24/2016      PT End of Session - 01/24/16 1433    Visit Number 11   Number of Visits 24   Date for PT Re-Evaluation 02/04/16   Authorization Type UHC medicare   Authorization Time Period g code and reasses done at visit 6    Authorization - Visit Number 11   Authorization - Number of Visits 16   PT Start Time 0950   PT Stop Time 1028   PT Time Calculation (min) 38 min   Activity Tolerance Patient tolerated treatment well   Behavior During Therapy [facility] for tasks assessed/performed      Past Medical History  Diagnosis Date  . Diabetes mellitus   . Hypertension   . Hyperlipidemia   . PVD (peripheral vascular disease) ([place])   . Cellulitis of buttock, left 2010  . Arthritis   . GERD (gastroesophageal reflux disease)   . CAD (coronary artery disease)   . MI (myocardial infarction) ([place])   . Wears glasses     Past Surgical History  Procedure Laterality Date  . Femoral-popliteal bypass graft  07/2010    pt has had 3 fem-pop bpg  . Right common iliac stent  10/2009    Dr Doren Custard ([place])  . Tonsillectomy    . S/p hysterecotmy      partial  . Foot amputation  2010    left  . Colonoscopy  10/26/2011    Procedure: COLONOSCOPY;  Surgeon: Dorothyann Peng, MD;  [facility];  Service: Endoscopy;  Laterality: N/A;  1:30  . Femoral-popliteal bypass graft  11/04/2012    Procedure: BYPASS GRAFT FEMORAL-POPLITEAL ARTERY;  Surgeon: Angelia Mould, MD;  Location: [facility] OR;  Service: Vascular;  Laterality: Left;  Redo Left Femoral Popliteal Bypass with PTFE  . Orif ankle fracture Left 01/27/2013    Procedure: OPEN REDUCTION INTERNAL FIXATION (ORIF) ANKLE FRACTURE;  Surgeon: Wylene Simmer, MD;  Location: [place];  Service: Orthopedics;  Laterality: Left;  . Abdominal aortagram N/A 07/28/2012    Procedure: ABDOMINAL Maxcine Ham;  Surgeon: Angelia Mould, MD;  Location: [facility] CATH LAB;  Service: Cardiovascular;  Laterality: N/A;  . Lower extremity angiogram Bilateral 07/28/2012    Procedure: LOWER EXTREMITY ANGIOGRAM;  Surgeon: Angelia Mould, MD;  Location[facility] CATH LAB;  Service: Cardiovascular;  Laterality: Bilateral;  . Percutaneous stent intervention Left 07/28/2012    Procedure: PERCUTANEOUS STENT INTERVENTION;  Surgeon: Angelia Mould, MD;  Location: [facility] CATH LAB;  Service: Cardiovascular;  Laterality: Left;  lt ext tiliac stentx1  . Peripheral vascular catheterization N/A 01/02/2016    Procedure: Abdominal Aortogram;  Surgeon: Angelia Mould, MD;  Location: [place] CV LAB;  Service: Cardiovascular;  Laterality: N/A;  . Lower extremity angiogram Left 01/02/2016    Procedure: Lower Extremity Angiogram;  Surgeon: Angelia Mould, MD;  Location: [place] CV LAB;  Service: Cardiovascular;  Laterality: Left;  . Peripheral vascular catheterization Left 01/02/2016    Procedure: Peripheral Vascular Intervention;  Surgeon: Angelia Mould, MD;  Location: [place] CV LAB;  Service: Cardiovascular;  Laterality: Left;  lt ext iliac    There were no vitals filed for this visit.       Subjective  Assessment - 01/24/16 1421    Subjective Pt stated foot is feeling better, current pain scale 5/10.  Dressings are intact   Currently in Pain? Yes   Pain Score 5    Pain Location Foot   Pain Orientation Left           Wound Therapy - 01/24/16 1421    Subjective Pt stated foot is feeling better, current pain scale 5/10.  Dressings are intact   Patient and Family Stated Goals wound to heal    Date of Onset 11/05/15   Prior Treatments self care   Pain Assessment 0-10   Pain Descriptors / Indicators Aching   Patients Stated Pain Goal 0   Multiple  Pain Sites Yes   Evaluation and Treatment Procedures Explained to Patient/Family Yes   Evaluation and Treatment Procedures agreed to   Wound Properties Date First Assessed: 01/05/16 Time First Assessed: 1435 Wound Type: Other (Comment) Location: Heel Location Orientation: Left Present on Admission: Yes   Dressing Type Silver dressings;Gauze (Comment)  silver, vaseline, ABD pad and gauze   Dressing Changed Changed   Dressing Status Clean;Old drainage   Dressing Change Frequency Every 3 days   Site / Wound Assessment Friable;Yellow   % Wound base Red or Granulating 0%   % Wound base Other (Comment) 100%  grey necrotic tissue   Peri-wound Assessment --  Mushy   Wound Length (cm) 0.9 cm   Wound Width (cm) 0.9 cm   Wound Depth (cm) --  unknown   Margins Unattached edges (unapproximated)   Closure None   Drainage Amount Minimal   Treatment Cleansed;Debridement (Selective);Hydrotherapy (Pulse lavage)   Wound Properties Date First Assessed: 11/10/15 Time First Assessed: 1455 Wound Type: Other (Comment) Location: Foot Location Orientation: Left Wound Description (Comments): lateral aspect of transmetatarsal ampuation    Dressing Type Gauze (Comment)  medihoney, vaseline perimeter, gauze   Dressing Changed Changed   Dressing Status Old drainage   Dressing Change Frequency Every 3 days   Site / Wound Assessment Dry;Red;Yellow   % Wound base Red or Granulating 85%   % Wound base Yellow 15%   Wound Length (cm) 1.5 cm   Wound Width (cm) 1.5 cm   Wound Depth (cm) 0.2 cm   Margins Epibole (rolled edges)   Drainage Amount Scant   Drainage Description Serous   Treatment Cleansed;Debridement (Selective)   Pulsed lavage therapy - wound location heel    Pulsed Lavage with Suction (psi) 4 psi   Pulsed Lavage with Suction - Normal Saline Used 1000 mL   Pulsed Lavage Tip Tip with splash shield   Selective Debridement - Location edges of wound bed    Selective Debridement - Tools Used  Forceps;Scalpel   Selective Debridement - Tissue Removed epiboled edges of fore foot wound; necrotic tissue off heel, slough, callous    Wound Therapy - Clinical Statement Pt continues to habe drainage from Bil wounds. Noted increased granualtion on forefoot following debridement.  Continued with PLS to heel for cleansing to loosen up.  Heel does continue to be mushy with unknown depth of wound.     Wound Therapy - Functional Problem List increased pain with increased walking .   Factors Delaying/Impairing Wound Healing Altered sensation;Diabetes Mellitus;Multiple medical problems;Polypharmacy;Vascular compromise   Hydrotherapy Plan Debridement;Dressing change   Wound Therapy - Frequency --  2x/ 8weeks   Wound Therapy - Current Recommendations PT   Dressing  medihoney to MTP; silver to heel with 4x4  and gauze  Patient will benefit from skilled therapeutic intervention in order to improve the following deficits and impairments:     Visit Diagnosis: Difficulty in walking, not elsewhere classified  Pain in left foot  Atherosclerosis of native arteries of left leg with ulceration of heel and midfoot ([place])     Problem List Patient Active Problem List   Diagnosis Date Noted  . Ankle fracture, bimalleolar, closed 12/29/2012  . Anemia of unknown etiology 11/06/2012  . Atherosclerotic PVD with ulceration ([place]) 11/04/2012  . Type II or unspecified type diabetes mellitus with peripheral circulatory disorders, uncontrolled(250.72) 11/04/2012  . Tobacco use disorder 11/04/2012  . Open wound of left foot 11/04/2012  . Atherosclerosis of native arteries of the extremities with ulceration(440.23) 07/09/2012  . Weight loss, unintentional 09/18/2011  . Anorexia 09/18/2011  . Early satiety 09/18/2011  . 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/07/2011   Ihor Austin, LPTA; CBIS [phone]  Aldona Lento 01/24/2016, 2:40 PM  [place], [place], [address] Phone: [phone]   Fax:  [phone]  Name: SANAH KRASKA MRN: [id number] Date of Birth: Feb 18, 1946

## 2016-01-25 ENCOUNTER — Encounter: Payer: Self-pay | Admitting: Vascular Surgery

## 2016-01-25 ENCOUNTER — Ambulatory Visit (INDEPENDENT_AMBULATORY_CARE_PROVIDER_SITE_OTHER): Payer: Medicare HMO | Admitting: Vascular Surgery

## 2016-01-25 VITALS — BP 157/69 | HR 71 | Temp 97.5°F | Ht 63.0 in | Wt 140.9 lb

## 2016-01-25 DIAGNOSIS — I739 Peripheral vascular disease, unspecified: Secondary | ICD-10-CM

## 2016-01-25 NOTE — Progress Notes (Signed)
HISTORY AND PHYSICAL     CC:  Follow up  Referring Provider:  Cory Munch, PA-C  HPI: This is a 70 y.o. female had multiple attempts at revascularization of the left lower extremity. She has diffuse multilevel disease. She has undergone previous stenting of the left external iliac artery back in 2013. She originally underwent a left femoral to below-knee popliteal artery bypass with a PTFE graft and a left transmetatarsal amputation. The great saphenous vein was not adequate for a bypass conduit. She said only presented with an occluded graft and underwent a redo bypass. This was on 11/04/2012. This also occluded.  When she was seen in March, she had developed a wound on the lateral aspect of the transmetatarsal amputation site.  She has since undergone angioplasty and stenting of the left external iliac artery on 01/02/16.  She states that the wound is getter better, but has since developed a wound on the left heel.  She states that she continues to smoke a couple of cigarettes a day when she gets stressed.  She is trying to quit smoking.  Her daughter states that she is going to also try to quit with her mom using the nicotine gum.    Past Medical History  Diagnosis Date  . Diabetes mellitus   . Hypertension   . Hyperlipidemia   . PVD (peripheral vascular disease) ([place])   . Cellulitis of buttock, left 2010  . Arthritis   . GERD (gastroesophageal reflux disease)   . CAD (coronary artery disease)   . MI (myocardial infarction) ([place])   . Wears glasses     Past Surgical History  Procedure Laterality Date  . Femoral-popliteal bypass graft  07/2010    pt has had 3 fem-pop bpg  . Right common iliac stent  10/2009    Dr Doren Custard ([place])  . Tonsillectomy    . S/p hysterecotmy      partial  . Foot amputation  2010    left  . Colonoscopy  10/26/2011    Procedure: COLONOSCOPY;  Surgeon: Dorothyann Peng, MD;  [facility];  Service: Endoscopy;  Laterality: N/A;  1:30  .  Femoral-popliteal bypass graft  11/04/2012    Procedure: BYPASS GRAFT FEMORAL-POPLITEAL ARTERY;  Surgeon: Angelia Mould, MD;  Location: [facility] OR;  Service: Vascular;  Laterality: Left;  Redo Left Femoral Popliteal Bypass with PTFE  . Orif ankle fracture Left 01/27/2013    Procedure: OPEN REDUCTION INTERNAL FIXATION (ORIF) ANKLE FRACTURE;  Surgeon: Wylene Simmer, MD;  Location: [place];  Service: Orthopedics;  Laterality: Left;  . Abdominal aortagram N/A 07/28/2012    Procedure: ABDOMINAL Maxcine Ham;  Surgeon: Angelia Mould, MD;  Location: [facility] CATH LAB;  Service: Cardiovascular;  Laterality: N/A;  . Lower extremity angiogram Bilateral 07/28/2012    Procedure: LOWER EXTREMITY ANGIOGRAM;  Surgeon: Angelia Mould, MD;  Location: [facility] CATH LAB;  Service: Cardiovascular;  Laterality: Bilateral;  . Percutaneous stent intervention Left 07/28/2012    Procedure: PERCUTANEOUS STENT INTERVENTION;  Surgeon: Angelia Mould, MD;  Location: [facility] CATH LAB;  Service: Cardiovascular;  Laterality: Left;  lt ext tiliac stentx1  . Peripheral vascular catheterization N/A 01/02/2016    Procedure: Abdominal Aortogram;  Surgeon: Angelia Mould, MD;  Location: [place] CV LAB;  Service: Cardiovascular;  Laterality: N/A;  . Lower extremity angiogram Left 01/02/2016    Procedure: Lower Extremity Angiogram;  Surgeon: Angelia Mould, MD;  Location: [place] CV LAB;  Service: Cardiovascular;  Laterality: Left;  . Peripheral vascular catheterization Left 01/02/2016    Procedure: Peripheral Vascular Intervention;  Surgeon: Angelia Mould, MD;  Location: [place] CV LAB;  Service: Cardiovascular;  Laterality: Left;  lt ext iliac    Allergies  Allergen Reactions  . Iohexol      Code: HIVES, Desc: PT STATES SHE EXPERIENCED ITCHING W/ IV DYE IN PAST-ARS 01/21/10, Onset Date: 16109604     Current Outpatient Prescriptions  Medication Sig Dispense Refill  . albuterol  (PROVENTIL HFA;VENTOLIN HFA) 108 (90 BASE) MCG/ACT inhaler Inhale 2 puffs into the lungs every 6 (six) hours as needed. For bronchitis and coughing    . alendronate (FOSAMAX) 70 MG tablet Take 70 mg by mouth every 7 (seven) days. On Mondays    . aspirin 325 MG EC tablet Take 325 mg by mouth daily.      . clopidogrel (PLAVIX) 75 MG tablet Take 75 mg by mouth daily.      . hydrochlorothiazide (HYDRODIURIL) 25 MG tablet Take 25 mg by mouth daily.    . insulin glargine (LANTUS) 100 UNIT/ML injection Inject 10 Units into the skin at bedtime.    Marland Kitchen labetalol (NORMODYNE) 200 MG tablet Take 200 mg by mouth 2 (two) times daily.      Marland Kitchen LORazepam (ATIVAN) 0.5 MG tablet Take 0.5 mg by mouth at bedtime.     . Multiple Vitamin (MULITIVITAMIN WITH MINERALS) TABS Take 1 tablet by mouth daily.    Marland Kitchen oxyCODONE-acetaminophen (PERCOCET/ROXICET) 5-325 MG tablet Take 1 tablet by mouth every 4 (four) hours as needed for severe pain. 30 tablet 0  . simvastatin (ZOCOR) 40 MG tablet Take 40 mg by mouth at bedtime.      No current facility-administered medications for this visit.    Family History  Problem Relation Age of Onset  . Brain cancer Father   . Cancer Father   . Diabetes Mother     many family members  . Alzheimer's disease Mother   . Heart disease      Social History   Social History  . Marital Status: Widowed    Spouse Name: N/A  . Number of Children: 3  . Years of Education: N/A   Occupational History  . retired; Product/process development scientist    Social History Main Topics  . Smoking status: Light Tobacco Smoker -- 0.40 packs/day for 20 years    Types: Cigarettes    Start date: 11/04/2012  . Smokeless tobacco: Never Used  . Alcohol Use: No  . Drug Use: No  . Sexual Activity: Not on file   Other Topics Concern  . Not on file   Social History Narrative     REVIEW OF SYSTEMS:   '[X]'$  denotes positive finding, '[ ]'$  denotes negative finding Cardiac  Comments:  Chest pain or chest pressure:      Shortness of breath upon exertion:    Short of breath when lying flat:    Irregular heart rhythm:        Vascular    Pain in calf, thigh, or hip brought on by ambulation:    Pain in feet at night that wakes you up from your sleep:     Blood clot in your veins:    Leg swelling:         Pulmonary    Oxygen at home:    Productive cough:     Wheezing:         Neurologic    Sudden weakness in  arms or legs:     Sudden numbness in arms or legs:     Sudden onset of difficulty speaking or slurred speech:    Temporary loss of vision in one eye:     Problems with dizziness:         Gastrointestinal    Blood in stool:     Vomited blood:         Genitourinary    Burning when urinating:     Blood in urine:        Psychiatric    Major depression:         Hematologic    Bleeding problems:    Problems with blood clotting too easily:        Skin    Rashes or ulcers: x       Constitutional    Fever or chills:      PHYSICAL EXAMINATION:  Filed Vitals:   01/25/16 1534  BP: 157/69  Pulse: 71  Temp: 97.5 F (36.4 C)   Body mass index is 24.97 kg/(m^2).  General:  WDWN in NAD; vital signs documented above Gait: Not observed HENT: WNL, normocephalic Pulmonary: normal non-labored breathing Cardiac: regular HR Skin: without rashes Vascular Exam/Pulses:  Left  Femoral 2+ (normal)  peroneal Monophasic doppler signal   Extremities: 1.5cm wound with good granulation tissue left lateral transmet amputation site.  ~1cm wound left heel. Musculoskeletal: no muscle wasting or atrophy  Neurologic: A&O X 3;  No focal weakness or paresthesias are detected Psychiatric:  The pt has Normal affect.   Non-Invasive Vascular Imaging:   None today  Pt meds includes: Statin:  Yes.   Beta Blocker:  Yes.   Aspirin:  Yes.   ACEI:  No. ARB:  No. Other Antiplatelet/Anticoagulant:  Yes.   Plavix   ASSESSMENT/PLAN:: 70 y.o. female with non healing wound left transmetatarsal amputation  site and s/p angioplasty and stenting of left external iliac artery.   -pt's wound on the transmetatarsal amputation site is healing and has good granulation tissue measuring ~ 1.5cm.  Continue wound care per wound care center. -discussed floating heels off the bed or sofa when at home to take pressure off of the heel. -smoking cessation strongly encouraged to the pt.  I think it will be beneficial for the pt to have her daughter try to quit smoking with her as they do plan on this with nicotine gum.  I also gave her the idea of adult coloring books when she gets the urge to smoke.  -a diet strong in fruits and vegetables is also encouraged.  -she will f/u in 6 months with aorto iliac duplex as well as ABI's. -she will call sooner if needed.    Leontine Locket, PA-C [facility] [phone]  Clinic MD:  Pt seen and examined in conjunction with Dr. Scot Dock

## 2016-01-31 ENCOUNTER — Ambulatory Visit (HOSPITAL_COMMUNITY): Payer: Medicare HMO

## 2016-01-31 DIAGNOSIS — M79672 Pain in left foot: Secondary | ICD-10-CM

## 2016-01-31 DIAGNOSIS — R2681 Unsteadiness on feet: Secondary | ICD-10-CM | POA: Diagnosis not present

## 2016-01-31 DIAGNOSIS — L97423 Non-pressure chronic ulcer of left heel and midfoot with necrosis of muscle: Secondary | ICD-10-CM | POA: Diagnosis not present

## 2016-01-31 DIAGNOSIS — T148 Other injury of unspecified body region: Secondary | ICD-10-CM | POA: Diagnosis not present

## 2016-01-31 DIAGNOSIS — I70244 Atherosclerosis of native arteries of left leg with ulceration of heel and midfoot: Secondary | ICD-10-CM

## 2016-01-31 DIAGNOSIS — I70334 Atherosclerosis of unspecified type of bypass graft(s) of the right leg with ulceration of heel and midfoot: Secondary | ICD-10-CM | POA: Diagnosis not present

## 2016-01-31 DIAGNOSIS — L97421 Non-pressure chronic ulcer of left heel and midfoot limited to breakdown of skin: Secondary | ICD-10-CM | POA: Diagnosis not present

## 2016-01-31 DIAGNOSIS — R262 Difficulty in walking, not elsewhere classified: Secondary | ICD-10-CM | POA: Diagnosis not present

## 2016-01-31 NOTE — Therapy (Signed)
[place], [place], [address] Phone: [phone]   Fax:  [phone]  Wound Care Therapy  Patient Details  Name: Sherri Brooks MRN: [id number] Date of Birth: 03-30-1946 No Data Recorded  Encounter Date: 01/31/2016      PT End of Session - 01/31/16 1537    Visit Number 12   Number of Visits 24   Date for PT Re-Evaluation 02/04/16   Authorization Type [employer] medicare   Authorization Time Period g code and reasses done at visit 6    Authorization - Visit Number 12   Authorization - Number of Visits 16   PT Start Time 1418   PT Stop Time 1500   PT Time Calculation (min) 42 min   Activity Tolerance Patient tolerated treatment well   Behavior During Therapy [facility] for tasks assessed/performed      Past Medical History  Diagnosis Date  . Diabetes mellitus   . Hypertension   . Hyperlipidemia   . PVD (peripheral vascular disease) ([place])   . Cellulitis of buttock, left 2010  . Arthritis   . GERD (gastroesophageal reflux disease)   . CAD (coronary artery disease)   . MI (myocardial infarction) ([place])   . Wears glasses     Past Surgical History  Procedure Laterality Date  . Femoral-popliteal bypass graft  07/2010    pt has had 3 fem-pop bpg  . Right common iliac stent  10/2009    Dr Doren Custard ([place])  . Tonsillectomy    . S/p hysterecotmy      partial  . Foot amputation  2010    left  . Colonoscopy  10/26/2011    Procedure: COLONOSCOPY;  Surgeon: Dorothyann Peng, MD;  [facility];  Service: Endoscopy;  Laterality: N/A;  1:30  . Femoral-popliteal bypass graft  11/04/2012    Procedure: BYPASS GRAFT FEMORAL-POPLITEAL ARTERY;  Surgeon: Angelia Mould, MD;  Location: [facility] OR;  Service: Vascular;  Laterality: Left;  Redo Left Femoral Popliteal Bypass with PTFE  . Orif ankle fracture Left 01/27/2013    Procedure: OPEN REDUCTION INTERNAL FIXATION (ORIF) ANKLE FRACTURE;  Surgeon: Wylene Simmer, MD;  Location: [place];  Service: Orthopedics;  Laterality: Left;  . Abdominal aortagram N/A 07/28/2012    Procedure: ABDOMINAL Maxcine Ham;  Surgeon: Angelia Mould, MD;  Location: [place] CATH LAB;  Service: Cardiovascular;  Laterality: N/A;  . Lower extremity angiogram Bilateral 07/28/2012    Procedure: LOWER EXTREMITY ANGIOGRAM;  Surgeon: Angelia Mould, MD;  Location: [facility] CATH LAB;  Service: Cardiovascular;  Laterality: Bilateral;  . Percutaneous stent intervention Left 07/28/2012    Procedure: PERCUTANEOUS STENT INTERVENTION;  Surgeon: Angelia Mould, MD;  Location: [facility] CATH LAB;  Service: Cardiovascular;  Laterality: Left;  lt ext tiliac stentx1  . Peripheral vascular catheterization N/A 01/02/2016    Procedure: Abdominal Aortogram;  Surgeon: Angelia Mould, MD;  Location: [place] CV LAB;  Service: Cardiovascular;  Laterality: N/A;  . Lower extremity angiogram Left 01/02/2016    Procedure: Lower Extremity Angiogram;  Surgeon: Angelia Mould, MD;  Location: [place] CV LAB;  Service: Cardiovascular;  Laterality: Left;  . Peripheral vascular catheterization Left 01/02/2016    Procedure: Peripheral Vascular Intervention;  Surgeon: Angelia Mould, MD;  Location: [place] CV LAB;  Service: Cardiovascular;  Laterality: Left;  lt ext iliac    There were no vitals filed for this visit.       Subjective  Assessment - 01/31/16 1506    Subjective Pt stated she went to vascular surgeon last week and reports MD happy with wound progress. Current pain scale 3/10 for Lt foot, dressings are intact upon arrival.     Currently in Pain? Yes   Pain Score 3    Pain Location Foot   Pain Orientation Left   Pain Descriptors / Indicators Discomfort   Pain Type Chronic pain                   Wound Therapy - 01/31/16 1506    Subjective Pt stated she went to vascular surgeon last week and reports MD happy with wound progress. Current pain scale 3/10 for Lt  foot, dressings are intact upon arrival.     Patient and Family Stated Goals wound to heal    Date of Onset 11/05/15   Prior Treatments self care   Pain Assessment 0-10   Patients Stated Pain Goal 0   Multiple Pain Sites Yes   Evaluation and Treatment Procedures Explained to Patient/Family Yes   Evaluation and Treatment Procedures agreed to   Wound Properties Date First Assessed: 01/05/16 Time First Assessed: 1435 Wound Type: Other (Comment) Location: Heel Location Orientation: Left Present on Admission: Yes   Dressing Type --  medipore honey, ABD pad and gauze   Dressing Changed Changed   Dressing Status Clean;Old drainage   Dressing Change Frequency Every 3 days   Site / Wound Assessment Friable;Yellow   % Wound base Red or Granulating 0%   % Wound base Other (Comment) 100%  grey necrotic tissue   Peri-wound Assessment --  Mushy   Margins Unattached edges (unapproximated)   Closure None   Drainage Amount Minimal   Treatment Cleansed;Debridement (Selective);Hydrotherapy (Pulse lavage)   Wound Properties Date First Assessed: 11/10/15 Time First Assessed: 1455 Wound Type: Other (Comment) Location: Foot Location Orientation: Left Wound Description (Comments): lateral aspect of transmetatarsal ampuation    Dressing Type Impregnated gauze (petrolatum);Gauze (Comment)   Dressing Changed Changed   Dressing Status Old drainage   Dressing Change Frequency Every 3 days   Site / Wound Assessment Dry;Red;Pink   % Wound base Red or Granulating 100%   % Wound base Yellow 0%   Margins Epibole (rolled edges)   Drainage Amount Scant   Drainage Description Serous   Treatment Cleansed;Debridement (Selective)   Pulsed lavage therapy - wound location heel    Pulsed Lavage with Suction (psi) 4 psi   Pulsed Lavage with Suction - Normal Saline Used 1000 mL   Pulsed Lavage Tip Tip with splash shield   Selective Debridement - Location edges and wound bed    Selective Debridement - Tools Used  Forceps;Scalpel   Selective Debridement - Tissue Removed epiboled edges of fore foot wound; necrotic tissue off heel, slough, callous    Wound Therapy - Clinical Statement Improved granulation for lateral MTP wound, mainly removal of dry skin perimeter of wound to promote heal with minimal removal of slough on wound bed.  Changed dressing to xeroform for moisture to promote healing.  Heel continues to have significant dry and dead skin with 100% grey necrotic tissue with unknown depth and feels mushy.  Continued with PLS for cleansing and added medihoney to heel to promote healing.  Added additional ABP pads to heel for draininage.  Applied lotion on foot for dry skin.  End of session pt reports pain free foot.  Pt encouraged to continue with pressure relief and to decrease smoking.  Pt stated she has been trying hard to reduce smoking.     Wound Therapy - Functional Problem List increased pain with increased walking .   Factors Delaying/Impairing Wound Healing Altered sensation;Diabetes Mellitus;Multiple medical problems;Polypharmacy;Vascular compromise   Hydrotherapy Plan Debridement;Dressing change   Wound Therapy - Frequency --  2x week for 4 more weeks   Wound Therapy - Current Recommendations PT   Wound Plan Contiue approraite wound care to Bil wounds, PLS to heel with approriate dressings.  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 Ankle fracture, bimalleolar, closed 12/29/2012  . Anemia of unknown etiology 11/06/2012  . Atherosclerotic PVD  with ulceration ([place]) 11/04/2012  . Type II or unspecified type diabetes mellitus with peripheral circulatory disorders, uncontrolled(250.72) 11/04/2012  . Tobacco use disorder 11/04/2012  . Open wound of left foot 11/04/2012  . Atherosclerosis of native arteries of the extremities with ulceration(440.23) 07/09/2012  . Weight loss, unintentional 09/18/2011  . Anorexia 09/18/2011  . Early satiety 09/18/2011  . 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/07/2011   Ihor Austin, LPTA; CBIS [phone]  Aldona Lento 01/31/2016, 3:48 PM  [place], [place], [address] Phone: [phone]   Fax:  [phone]  Name: Sherri Brooks MRN: [id number] Date of Birth: Jul 07, 1946

## 2016-02-02 ENCOUNTER — Ambulatory Visit (HOSPITAL_COMMUNITY): Payer: Medicare HMO

## 2016-02-02 ENCOUNTER — Telehealth (HOSPITAL_COMMUNITY): Payer: Self-pay

## 2016-02-02 NOTE — Telephone Encounter (Signed)
No show, tried to call pt, unable to leave message as mail box is full.    [address], [place]; CBIS [phone]'

## 2016-02-03 NOTE — Addendum Note (Signed)
Addended by: Leeroy Cha on: 02/03/2016 01:20 PM   Modules accepted: Orders

## 2016-02-07 ENCOUNTER — Ambulatory Visit (HOSPITAL_COMMUNITY): Payer: Medicare HMO | Admitting: Physical Therapy

## 2016-02-07 DIAGNOSIS — R262 Difficulty in walking, not elsewhere classified: Secondary | ICD-10-CM | POA: Diagnosis not present

## 2016-02-07 DIAGNOSIS — R2681 Unsteadiness on feet: Secondary | ICD-10-CM

## 2016-02-07 DIAGNOSIS — L97421 Non-pressure chronic ulcer of left heel and midfoot limited to breakdown of skin: Secondary | ICD-10-CM | POA: Diagnosis not present

## 2016-02-07 DIAGNOSIS — M79672 Pain in left foot: Secondary | ICD-10-CM | POA: Diagnosis not present

## 2016-02-07 DIAGNOSIS — I70334 Atherosclerosis of unspecified type of bypass graft(s) of the right leg with ulceration of heel and midfoot: Secondary | ICD-10-CM | POA: Diagnosis not present

## 2016-02-07 DIAGNOSIS — I70244 Atherosclerosis of native arteries of left leg with ulceration of heel and midfoot: Secondary | ICD-10-CM

## 2016-02-07 DIAGNOSIS — L97423 Non-pressure chronic ulcer of left heel and midfoot with necrosis of muscle: Secondary | ICD-10-CM | POA: Diagnosis not present

## 2016-02-07 DIAGNOSIS — T148 Other injury of unspecified body region: Secondary | ICD-10-CM | POA: Diagnosis not present

## 2016-02-07 NOTE — Therapy (Signed)
[place], [place], [address] Phone: [phone]   Fax:  [phone]  Wound Care Therapy  Patient Details  Name: Sherri Brooks MRN: [id number] Date of Birth: [birth year] No Data Recorded  Encounter Date: 02/07/2016      PT End of Session - 02/07/16 1638    Visit Number 13   Number of Visits 24   Date for PT Re-Evaluation 02/04/16   Authorization Type UHC medicare   Authorization Time Period g code and reasses done at visit 6    Authorization - Visit Number 12   Authorization - Number of Visits 16   PT Start Time 1435   PT Stop Time 1515   PT Time Calculation (min) 40 min   Activity Tolerance Patient tolerated treatment well      Past Medical History  Diagnosis Date  . Diabetes mellitus   . Hypertension   . Hyperlipidemia   . PVD (peripheral vascular disease) ([place])   . Cellulitis of buttock, left 2010  . Arthritis   . GERD (gastroesophageal reflux disease)   . CAD (coronary artery disease)   . MI (myocardial infarction) ([place])   . Wears glasses     Past Surgical History  Procedure Laterality Date  . Femoral-popliteal bypass graft  07/2010    pt has had 3 fem-pop bpg  . Right common iliac stent  10/2009    Dr Doren Custard ([place])  . Tonsillectomy    . S/p hysterecotmy      partial  . Foot amputation  2010    left  . Colonoscopy  10/26/2011    Procedure: COLONOSCOPY;  Surgeon: Dorothyann Peng, MD;  [facility];  Service: Endoscopy;  Laterality: N/A;  1:30  . Femoral-popliteal bypass graft  11/04/2012    Procedure: BYPASS GRAFT FEMORAL-POPLITEAL ARTERY;  Surgeon: Angelia Mould, MD;  Location: [facility] OR;  Service: Vascular;  Laterality: Left;  Redo Left Femoral Popliteal Bypass with PTFE  . Orif ankle fracture Left 01/27/2013    Procedure: OPEN REDUCTION INTERNAL FIXATION (ORIF) ANKLE FRACTURE;  Surgeon: Wylene Simmer, MD;  Location: [place];  Service: Orthopedics;  Laterality:  Left;  . Abdominal aortagram N/A 07/28/2012    Procedure: ABDOMINAL Maxcine Ham;  Surgeon: Angelia Mould, MD;  Location: [facility] CATH LAB;  Service: Cardiovascular;  Laterality: N/A;  . Lower extremity angiogram Bilateral 07/28/2012    Procedure: LOWER EXTREMITY ANGIOGRAM;  Surgeon: Angelia Mould, MD;  Location: [facility] CATH LAB;  Service: Cardiovascular;  Laterality: Bilateral;  . Percutaneous stent intervention Left 07/28/2012    Procedure: PERCUTANEOUS STENT INTERVENTION;  Surgeon: Angelia Mould, MD;  Location: [facility] CATH LAB;  Service: Cardiovascular;  Laterality: Left;  lt ext tiliac stentx1  . Peripheral vascular catheterization N/A 01/02/2016    Procedure: Abdominal Aortogram;  Surgeon: Angelia Mould, MD;  Location: [place] CV LAB;  Service: Cardiovascular;  Laterality: N/A;  . Lower extremity angiogram Left 01/02/2016    Procedure: Lower Extremity Angiogram;  Surgeon: Angelia Mould, MD;  Location: [place] CV LAB;  Service: Cardiovascular;  Laterality: Left;  . Peripheral vascular catheterization Left 01/02/2016    Procedure: Peripheral Vascular Intervention;  Surgeon: Angelia Mould, MD;  Location: [place] CV LAB;  Service: Cardiovascular;  Laterality: Left;  lt ext iliac    There were no vitals filed for this visit.  Wound Therapy - 02/07/16 1628    Subjective Pt states her leg is feeling better    Patient and Family Stated Goals wound to heal    Date of Onset 11/05/15   Prior Treatments self care   Pain Assessment 0-10   Pain Score 2    Pain Type Chronic pain   Pain Location Foot   Pain Orientation Left   Pain Descriptors / Indicators Discomfort   Patients Stated Pain Goal 0   Multiple Pain Sites Yes   Evaluation and Treatment Procedures Explained to Patient/Family Yes   Evaluation and Treatment Procedures agreed to   Wound Properties Date First Assessed: 01/05/16 Time First Assessed: 1435 Wound Type:  Other (Comment) Location: Heel Location Orientation: Left Present on Admission: Yes   Dressing Type Silver dressings   Dressing Changed Changed   Dressing Status Clean;Old drainage   Dressing Change Frequency Every 3 days   Site / Wound Assessment Friable;Yellow   % Wound base Red or Granulating 0%   % Wound base Other (Comment) 100%  grey necrotic tissue   Peri-wound Assessment --  Mushy   Margins Unattached edges (unapproximated)   Closure None   Drainage Amount Minimal   Treatment Cleansed;Debridement (Selective)   Wound Properties Date First Assessed: 11/10/15 Time First Assessed: 1455 Wound Type: Other (Comment) Location: Foot Location Orientation: Left Wound Description (Comments): lateral aspect of transmetatarsal ampuation    Dressing Type --  medihoney, 4x4 and gauze    Dressing Changed Changed   Dressing Status Old drainage   Dressing Change Frequency Every 3 days   Site / Wound Assessment Dry;Red;Pink   % Wound base Red or Granulating 100%  after debridment.     % Wound base Yellow 0%   Margins Epibole (rolled edges)   Drainage Amount Scant   Drainage Description Serous   Treatment Cleansed;Debridement (Selective)   Pulsed lavage therapy - wound location heel    Pulsed Lavage with Suction (psi) 4 psi   Pulsed Lavage with Suction - Normal Saline Used 1000 mL   Pulsed Lavage Tip Tip with splash shield   Selective Debridement - Location edges and wound bed    Selective Debridement - Tools Used Forceps;Scalpel   Selective Debridement - Tissue Removed epiboled edges of fore foot wound; necrotic tissue off heel, slough, callous    Wound Therapy - Clinical Statement Pt foot is cold. Pt is not going back to the vascular surgeon for 6 weeks. If foot is cold next treatment will recommend pt contacting vascular surgeon.  Pt mid foot wound has callous area surrounding that was partially debrided.  Heel wound continues to have no granulation with the heel feeling mushy.  Will  continue to use pulse lavage and debride mechanically.    Wound Therapy - Functional Problem List increased pain with increased walking .   Factors Delaying/Impairing Wound Healing Altered sensation;Diabetes Mellitus;Multiple medical problems;Polypharmacy;Vascular compromise   Hydrotherapy Plan Debridement;Dressing change   Wound Therapy - Frequency --  2x week for 4 more weeks   Wound Therapy - Current Recommendations PT   Wound Plan Contiue approraite wound care to Bil wounds,    Dressing  medihoney to heel with ABP pad and xeroform to MTP                           Patient will benefit from skilled therapeutic intervention in order to improve the following deficits and impairments:     Visit Diagnosis:  Pain in left foot  Atherosclerosis of native arteries of left leg with ulceration of heel and midfoot (HCC)  Unsteadiness on feet     Problem List Patient Active Problem List   Diagnosis Date Noted  . Ankle fracture, bimalleolar, closed 12/29/2012  . Anemia of unknown etiology 11/06/2012  . Atherosclerotic PVD with ulceration ([place]) 11/04/2012  . Type II or unspecified type diabetes mellitus with peripheral circulatory disorders, uncontrolled(250.72) 11/04/2012  . Tobacco use disorder 11/04/2012  . Open wound of left foot 11/04/2012  . Atherosclerosis of native arteries of the extremities with ulceration(440.23) 07/09/2012  . Weight loss, unintentional 09/18/2011  . Anorexia 09/18/2011  . Early satiety 09/18/2011  . 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/07/2011    [name], PT CLT [phone] 02/07/2016, 4:40 PM  [place] [address] [place], [place], [address] Phone: [phone]   Fax:  [phone]  Name: Sherri Brooks MRN: [id number] Date of Birth: [birth year]

## 2016-02-09 ENCOUNTER — Ambulatory Visit (HOSPITAL_COMMUNITY): Payer: Medicare HMO

## 2016-02-09 ENCOUNTER — Encounter: Payer: Self-pay | Admitting: Vascular Surgery

## 2016-02-16 ENCOUNTER — Ambulatory Visit (HOSPITAL_COMMUNITY): Payer: Medicare HMO | Attending: Family Medicine | Admitting: Physical Therapy

## 2016-02-16 DIAGNOSIS — R2681 Unsteadiness on feet: Secondary | ICD-10-CM | POA: Diagnosis not present

## 2016-02-16 DIAGNOSIS — I70244 Atherosclerosis of native arteries of left leg with ulceration of heel and midfoot: Secondary | ICD-10-CM | POA: Diagnosis not present

## 2016-02-16 DIAGNOSIS — M79672 Pain in left foot: Secondary | ICD-10-CM | POA: Diagnosis not present

## 2016-02-16 NOTE — Therapy (Signed)
[place], [place], [address] Phone: [phone]   Fax:  [phone]  Physical Therapy Treatment  Patient Details  Name: Sherri Brooks MRN: [id number] Date of Birth: 1945-10-24 No Data Recorded  Encounter Date: 02/16/2016      PT End of Session - 02/16/16 1713    Visit Number 14   Number of Visits 24   Date for PT Re-Evaluation 02/04/16   Authorization Type UHC medicare   Authorization Time Period g code and reasses done at visit 6    Authorization - Visit Number 14   Authorization - Number of Visits 16   PT Start Time 1517   PT Stop Time 1555   PT Time Calculation (min) 25 min   Activity Tolerance Patient tolerated treatment well   Behavior During Therapy [facility] for tasks assessed/performed      Past Medical History  Diagnosis Date  . Diabetes mellitus   . Hypertension   . Hyperlipidemia   . PVD (peripheral vascular disease) ([place])   . Cellulitis of buttock, left 2010  . Arthritis   . GERD (gastroesophageal reflux disease)   . CAD (coronary artery disease)   . MI (myocardial infarction) ([place])   . Wears glasses     Past Surgical History  Procedure Laterality Date  . Femoral-popliteal bypass graft  07/2010    pt has had 3 fem-pop bpg  . Right common iliac stent  10/2009    Dr Doren Custard ([place])  . Tonsillectomy    . S/p hysterecotmy      partial  . Foot amputation  2010    left  . Colonoscopy  10/26/2011    Procedure: COLONOSCOPY;  Surgeon: Dorothyann Peng, MD;  [facility];  Service: Endoscopy;  Laterality: N/A;  1:30  . Femoral-popliteal bypass graft  11/04/2012    Procedure: BYPASS GRAFT FEMORAL-POPLITEAL ARTERY;  Surgeon: Angelia Mould, MD;  Location: [facility] OR;  Service: Vascular;  Laterality: Left;  Redo Left Femoral Popliteal Bypass with PTFE  . Orif ankle fracture Left 01/27/2013    Procedure: OPEN REDUCTION INTERNAL FIXATION (ORIF) ANKLE FRACTURE;  Surgeon: Wylene Simmer, MD;   Location: [place];  Service: Orthopedics;  Laterality: Left;  . Abdominal aortagram N/A 07/28/2012    Procedure: ABDOMINAL Maxcine Ham;  Surgeon: Angelia Mould, MD;  Location: [facility] CATH LAB;  Service: Cardiovascular;  Laterality: N/A;  . Lower extremity angiogram Bilateral 07/28/2012    Procedure: LOWER EXTREMITY ANGIOGRAM;  Surgeon: Angelia Mould, MD;  Location: [facility] CATH LAB;  Service: Cardiovascular;  Laterality: Bilateral;  . Percutaneous stent intervention Left 07/28/2012    Procedure: PERCUTANEOUS STENT INTERVENTION;  Surgeon: Angelia Mould, MD;  Location: [facility] CATH LAB;  Service: Cardiovascular;  Laterality: Left;  lt ext tiliac stentx1  . Peripheral vascular catheterization N/A 01/02/2016    Procedure: Abdominal Aortogram;  Surgeon: Angelia Mould, MD;  Location: [place] CV LAB;  Service: Cardiovascular;  Laterality: N/A;  . Lower extremity angiogram Left 01/02/2016    Procedure: Lower Extremity Angiogram;  Surgeon: Angelia Mould, MD;  Location: [place] CV LAB;  Service: Cardiovascular;  Laterality: Left;  . Peripheral vascular catheterization Left 01/02/2016    Procedure: Peripheral Vascular Intervention;  Surgeon: Angelia Mould, MD;  Location: [place] CV LAB;  Service: Cardiovascular;  Laterality: Left;  lt ext iliac    There were no vitals filed for this visit.  Wound Therapy - 02/16/16 1708    Subjective Pt states her leg is feeling better    Patient and Family Stated Goals wound to heal    Date of Onset 11/05/15   Prior Treatments self care   Pain Assessment No/denies pain   Pain Score 0-No pain   Evaluation and Treatment Procedures Explained to Patient/Family Yes   Evaluation and Treatment Procedures agreed to   Wound Properties Date First Assessed: 01/05/16 Time First Assessed: 1435 Wound Type: Other (Comment) Location: Heel Location Orientation: Left Present on Admission:  Yes   Dressing Type Gauze (Comment)  medihoney get   Dressing Changed Changed   Dressing Status Clean;Old drainage   Dressing Change Frequency Every 3 days   Site / Wound Assessment Friable;Yellow   % Wound base Red or Granulating 0%   % Wound base Other (Comment) 100%  grey necrotic tissue   Peri-wound Assessment --  Mushy   Margins Unattached edges (unapproximated)   Closure None   Drainage Amount Minimal   Treatment Hydrotherapy (Pulse lavage);Debridement (Selective)   Wound Properties Date First Assessed: 11/10/15 Time First Assessed: 1455 Wound Type: Other (Comment) Location: Foot Location Orientation: Left Wound Description (Comments): lateral aspect of transmetatarsal ampuation    Dressing Type Impregnated gauze (bismuth)  medihoney, 4x4 and gauze    Dressing Changed Changed   Dressing Status Old drainage   Dressing Change Frequency Every 3 days   Site / Wound Assessment Dry;Red;Pink   % Wound base Red or Granulating 100%  after debridment.     % Wound base Yellow 0%   Margins Unattached edges (unapproximated)   Drainage Amount Minimal   Drainage Description Serous   Treatment Hydrotherapy (Pulse lavage);Debridement (Selective)   Pulsed lavage therapy - wound location heel    Pulsed Lavage with Suction (psi) 4 psi   Pulsed Lavage with Suction - Normal Saline Used 1000 mL   Pulsed Lavage Tip Tip with splash shield   Selective Debridement - Location edges and wound bed    Selective Debridement - Tools Used Forceps;Scalpel   Selective Debridement - Tissue Removed epiboled edges of fore foot wound; necrotic tissue off heel, slough, callous    Wound Therapy - Clinical Statement Pt foot is warm upon arrival but as lay elevated decreases in temperature. Used pulse lavage to irrigate wounds well.  Spent a large amount of time debriding the perimeter of wound and removing slough from edges and wound bed to promote granulation.  Medihoney gel packed into heel wound to promote  increased drainage and granulation   Wound Therapy - Functional Problem List increased pain with increased walking .   Factors Delaying/Impairing Wound Healing Altered sensation;Diabetes Mellitus;Multiple medical problems;Polypharmacy;Vascular compromise   Hydrotherapy Plan Debridement;Dressing change   Wound Therapy - Frequency --  2x week for 4 more weeks   Wound Therapy - Current Recommendations PT   Wound Plan Contiue approraite wound care to Bil wounds,    Dressing  medihoney to heel with ABP pad and xeroform to MTP                            Patient will benefit from skilled therapeutic intervention in order to improve the following deficits and impairments:     Visit Diagnosis: Pain in left foot  Atherosclerosis of native arteries of left leg with ulceration of heel and midfoot (HCC)  Unsteadiness on feet     Problem List Patient Active Problem List  Diagnosis Date Noted  . Ankle fracture, bimalleolar, closed 12/29/2012  . Anemia of unknown etiology 11/06/2012  . Atherosclerotic PVD with ulceration ([place]) 11/04/2012  . Type II or unspecified type diabetes mellitus with peripheral circulatory disorders, uncontrolled(250.72) 11/04/2012  . Tobacco use disorder 11/04/2012  . Open wound of left foot 11/04/2012  . Atherosclerosis of native arteries of the extremities with ulceration(440.23) 07/09/2012  . Weight loss, unintentional 09/18/2011  . Anorexia 09/18/2011  . Early satiety 09/18/2011  . 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/07/2011    Teena Irani, PTA/CLT [phone]  02/16/2016, 5:15 PM  [place] [address] [place], [place], [address] Phone: [phone]   Fax:  [phone]  Name: Sherri Brooks MRN: [id number] Date of Birth: Mar 13, 1946

## 2016-02-21 ENCOUNTER — Ambulatory Visit (HOSPITAL_COMMUNITY): Payer: Medicare HMO | Admitting: Physical Therapy

## 2016-02-22 ENCOUNTER — Ambulatory Visit (HOSPITAL_COMMUNITY): Payer: Medicare HMO | Admitting: Physical Therapy

## 2016-02-22 DIAGNOSIS — M79672 Pain in left foot: Secondary | ICD-10-CM | POA: Diagnosis not present

## 2016-02-22 DIAGNOSIS — I70244 Atherosclerosis of native arteries of left leg with ulceration of heel and midfoot: Secondary | ICD-10-CM

## 2016-02-22 NOTE — Therapy (Signed)
[place], [place], [address] Phone: [phone]   Fax:  [phone]  Wound Care Therapy  Patient Details  Name: Sherri Brooks MRN: [id number] Date of Birth: [birth year] No Data Recorded  Encounter Date: 02/22/2016      PT End of Session - 02/22/16 1740    Visit Number 15   Number of Visits 24   Date for PT Re-Evaluation 02/04/16   Authorization Type UHC medicare   Authorization Time Period g code and reasses done at visit 6    Authorization - Visit Number 15   Authorization - Number of Visits 16   PT Start Time 1655   PT Stop Time 1725   PT Time Calculation (min) 30 min   Activity Tolerance Patient tolerated treatment well   Behavior During Therapy [facility] for tasks assessed/performed      Past Medical History  Diagnosis Date  . Diabetes mellitus   . Hypertension   . Hyperlipidemia   . PVD (peripheral vascular disease) ([place])   . Cellulitis of buttock, left 2010  . Arthritis   . GERD (gastroesophageal reflux disease)   . CAD (coronary artery disease)   . MI (myocardial infarction) ([place])   . Wears glasses     Past Surgical History  Procedure Laterality Date  . Femoral-popliteal bypass graft  07/2010    pt has had 3 fem-pop bpg  . Right common iliac stent  10/2009    Dr Doren Custard ([place])  . Tonsillectomy    . S/p hysterecotmy      partial  . Foot amputation  2010    left  . Colonoscopy  10/26/2011    Procedure: COLONOSCOPY;  Surgeon: Dorothyann Peng, MD;  [facility];  Service: Endoscopy;  Laterality: N/A;  1:30  . Femoral-popliteal bypass graft  11/04/2012    Procedure: BYPASS GRAFT FEMORAL-POPLITEAL ARTERY;  Surgeon: Angelia Mould, MD;  Location: [facility] OR;  Service: Vascular;  Laterality: Left;  Redo Left Femoral Popliteal Bypass with PTFE  . Orif ankle fracture Left 01/27/2013    Procedure: OPEN REDUCTION INTERNAL FIXATION (ORIF) ANKLE FRACTURE;  Surgeon: Wylene Simmer, MD;  Location: [place];  Service: Orthopedics;  Laterality: Left;  . Abdominal aortagram N/A 07/28/2012    Procedure: ABDOMINAL Maxcine Ham;  Surgeon: Angelia Mould, MD;  Location: [facility] CATH LAB;  Service: Cardiovascular;  Laterality: N/A;  . Lower extremity angiogram Bilateral 07/28/2012    Procedure: LOWER EXTREMITY ANGIOGRAM;  Surgeon: Angelia Mould, MD;  Location: [facility] CATH LAB;  Service: Cardiovascular;  Laterality: Bilateral;  . Percutaneous stent intervention Left 07/28/2012    Procedure: PERCUTANEOUS STENT INTERVENTION;  Surgeon: Angelia Mould, MD;  Location: [facility] CATH LAB;  Service: Cardiovascular;  Laterality: Left;  lt ext tiliac stentx1  . Peripheral vascular catheterization N/A 01/02/2016    Procedure: Abdominal Aortogram;  Surgeon: Angelia Mould, MD;  Location: [place] CV LAB;  Service: Cardiovascular;  Laterality: N/A;  . Lower extremity angiogram Left 01/02/2016    Procedure: Lower Extremity Angiogram;  Surgeon: Angelia Mould, MD;  Location: [place] CV LAB;  Service: Cardiovascular;  Laterality: Left;  . Peripheral vascular catheterization Left 01/02/2016    Procedure: Peripheral Vascular Intervention;  Surgeon: Angelia Mould, MD;  Location: [place] CV LAB;  Service: Cardiovascular;  Laterality: Left;  lt ext iliac    There were no vitals filed for this visit.  Wound Therapy - 02/22/16 1730    Subjective Pt states Md was pleased with progress.  States it is not hurting her like it was.   Patient and Family Stated Goals wound to heal    Date of Onset 11/05/15   Prior Treatments self care   Pain Assessment No/denies pain   Wound Properties Date First Assessed: 01/05/16 Time First Assessed: 1435 Wound Type: Other (Comment) Location: Heel Location Orientation: Left Present on Admission: Yes   Dressing Type Gauze (Comment)  medihoney get   Dressing Changed Changed   Dressing Status Clean;Old drainage    Dressing Change Frequency Every 3 days   Site / Wound Assessment Friable;Yellow   % Wound base Red or Granulating 5%   % Wound base Other (Comment) 95%  grey necrotic tissue   Peri-wound Assessment --  Mushy   Margins Unattached edges (unapproximated)   Closure None   Drainage Amount Minimal   Treatment Hydrotherapy (Pulse lavage);Debridement (Selective)   Wound Properties Date First Assessed: 11/10/15 Time First Assessed: 1455 Wound Type: Other (Comment) Location: Foot Location Orientation: Left Wound Description (Comments): lateral aspect of transmetatarsal ampuation    Dressing Type Gauze (Comment)  medihoney, 4x4 and gauze    Dressing Changed Changed   Dressing Status Old drainage   Dressing Change Frequency Every 3 days   Site / Wound Assessment Dry;Red;Pink   % Wound base Red or Granulating 100%  after debridment.     % Wound base Yellow 0%   Margins Unattached edges (unapproximated)   Drainage Amount Minimal   Drainage Description Serous   Treatment Hydrotherapy (Pulse lavage)   Pulsed lavage therapy - wound location heel and tip of foot   Pulsed Lavage with Suction (psi) 4 psi   Pulsed Lavage with Suction - Normal Saline Used 1000 mL   Pulsed Lavage Tip Tip with splash shield   Selective Debridement - Location edges and wound bed    Selective Debridement - Tools Used Forceps;Scalpel;Scissors   Selective Debridement - Tissue Removed epiboled edges of fore foot wound; necrotic tissue off heel, slough, callous    Wound Therapy - Clinical Statement Pt foot is warm upon arrival but as lay elevated decreases in temperature. Continued use of pulse lavage to irrigate wounds well.  Edges calloused making approximation difficult.  Debrided edges of wounds with some noted approximation on dorsal edge of distal foot wound.  Continued undermining around plantar edge with some sensitivity.  Heel remains largely covered with slough that is unable to be debrided away.  Medihoney gel used on  both wounds today.   Wound Therapy - Functional Problem List increased pain with increased walking .   Factors Delaying/Impairing Wound Healing Altered sensation;Diabetes Mellitus;Multiple medical problems;Polypharmacy;Vascular compromise   Hydrotherapy Plan Debridement;Dressing change   Wound Therapy - Frequency --  2x week for 4 more weeks   Wound Therapy - Current Recommendations PT   Wound Plan Contiue approraite wound care to Bil wounds,    Dressing  medihoney to both                            Patient will benefit from skilled therapeutic intervention in order to improve the following deficits and impairments:     Visit Diagnosis: Pain in left foot  Atherosclerosis of native arteries of left leg with ulceration of heel and midfoot ([place])     Problem List Patient Active Problem List   Diagnosis Date Noted  .  Ankle fracture, bimalleolar, closed 12/29/2012  . Anemia of unknown etiology 11/06/2012  . Atherosclerotic PVD with ulceration ([place]) 11/04/2012  . Type II or unspecified type diabetes mellitus with peripheral circulatory disorders, uncontrolled(250.72) 11/04/2012  . Tobacco use disorder 11/04/2012  . Open wound of left foot 11/04/2012  . Atherosclerosis of native arteries of the extremities with ulceration(440.23) 07/09/2012  . Weight loss, unintentional 09/18/2011  . Anorexia 09/18/2011  . Early satiety 09/18/2011  . 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/07/2011    Teena Irani, PTA/CLT [phone]  02/22/2016, 5:42 PM  [place] [address] [place], [place], [address] Phone: [phone]   Fax:  [phone]  Name: SARY BOGIE MRN: [id number] Date of Birth: April 02, 1946

## 2016-02-23 ENCOUNTER — Ambulatory Visit (HOSPITAL_COMMUNITY): Payer: Medicare HMO | Admitting: Physical Therapy

## 2016-02-23 NOTE — Addendum Note (Signed)
Addended by: Dorothyann Gibbs on: 02/23/2016 03:01 PM   Modules accepted: Orders

## 2016-02-24 ENCOUNTER — Ambulatory Visit (HOSPITAL_COMMUNITY): Payer: Medicare HMO

## 2016-02-24 ENCOUNTER — Telehealth (HOSPITAL_COMMUNITY): Payer: Self-pay

## 2016-02-24 NOTE — Telephone Encounter (Signed)
No show, called pt about missed apt, phone went directly to voice mail and mailbox full, unable to leave message.    [address] [place]; [facility] [phone]

## 2016-02-28 ENCOUNTER — Ambulatory Visit (HOSPITAL_COMMUNITY): Payer: Medicare HMO | Admitting: Physical Therapy

## 2016-02-28 ENCOUNTER — Telehealth (HOSPITAL_COMMUNITY): Payer: Self-pay | Admitting: Physical Therapy

## 2016-02-28 NOTE — Telephone Encounter (Signed)
today is 2nd consecutive NS.  Called but answering machine was full.   Teena Irani, PTA/CLT [phone]

## 2016-03-01 ENCOUNTER — Ambulatory Visit (HOSPITAL_COMMUNITY): Payer: Medicare HMO | Admitting: Physical Therapy

## 2016-03-01 DIAGNOSIS — M79672 Pain in left foot: Secondary | ICD-10-CM | POA: Diagnosis not present

## 2016-03-01 DIAGNOSIS — I70244 Atherosclerosis of native arteries of left leg with ulceration of heel and midfoot: Secondary | ICD-10-CM

## 2016-03-01 NOTE — Therapy (Signed)
[place], [place], [address] Phone: [phone]   Fax:  [phone]  Wound Care Therapy  Patient Details  Name: Sherri Brooks MRN: [id number] Date of Birth: 1946/09/19 No Data Recorded  Encounter Date: 03/01/2016    Past Medical History  Diagnosis Date  . Diabetes mellitus   . Hypertension   . Hyperlipidemia   . PVD (peripheral vascular disease) ([place])   . Cellulitis of buttock, left 2010  . Arthritis   . GERD (gastroesophageal reflux disease)   . CAD (coronary artery disease)   . MI (myocardial infarction) ([place])   . Wears glasses     Past Surgical History  Procedure Laterality Date  . Femoral-popliteal bypass graft  07/2010    pt has had 3 fem-pop bpg  . Right common iliac stent  10/2009    Dr Doren Custard ([place])  . Tonsillectomy    . S/p hysterecotmy      partial  . Foot amputation  2010    left  . Colonoscopy  10/26/2011    Procedure: COLONOSCOPY;  Surgeon: Dorothyann Peng, MD;  [facility];  Service: Endoscopy;  Laterality: N/A;  1:30  . Femoral-popliteal bypass graft  11/04/2012    Procedure: BYPASS GRAFT FEMORAL-POPLITEAL ARTERY;  Surgeon: Angelia Mould, MD;  Location: [facility] OR;  Service: Vascular;  Laterality: Left;  Redo Left Femoral Popliteal Bypass with PTFE  . Orif ankle fracture Left 01/27/2013    Procedure: OPEN REDUCTION INTERNAL FIXATION (ORIF) ANKLE FRACTURE;  Surgeon: Wylene Simmer, MD;  Location: [place];  Service: Orthopedics;  Laterality: Left;  . Abdominal aortagram N/A 07/28/2012    Procedure: ABDOMINAL Maxcine Ham;  Surgeon: Angelia Mould, MD;  Location: [facility] CATH LAB;  Service: Cardiovascular;  Laterality: N/A;  . Lower extremity angiogram Bilateral 07/28/2012    Procedure: LOWER EXTREMITY ANGIOGRAM;  Surgeon: Angelia Mould, MD;  Location: [facility] CATH LAB;  Service: Cardiovascular;  Laterality: Bilateral;  . Percutaneous stent intervention Left  07/28/2012    Procedure: PERCUTANEOUS STENT INTERVENTION;  Surgeon: Angelia Mould, MD;  Location: [facility] CATH LAB;  Service: Cardiovascular;  Laterality: Left;  lt ext tiliac stentx1  . Peripheral vascular catheterization N/A 01/02/2016    Procedure: Abdominal Aortogram;  Surgeon: Angelia Mould, MD;  Location: [place] CV LAB;  Service: Cardiovascular;  Laterality: N/A;  . Lower extremity angiogram Left 01/02/2016    Procedure: Lower Extremity Angiogram;  Surgeon: Angelia Mould, MD;  Location: [place] CV LAB;  Service: Cardiovascular;  Laterality: Left;  . Peripheral vascular catheterization Left 01/02/2016    Procedure: Peripheral Vascular Intervention;  Surgeon: Angelia Mould, MD;  Location: [place] CV LAB;  Service: Cardiovascular;  Laterality: Left;  lt ext iliac    There were no vitals filed for this visit.                  Wound Therapy - 03/01/16 1659    Subjective Pt showed 10 minutes late for appointment accompanied by her daughter.  See patient education for more details.   Patient and Family Stated Goals wound to heal    Date of Onset 11/05/15   Prior Treatments self care   Pain Assessment No/denies pain   Wound Properties Date First Assessed: 01/05/16 Time First Assessed: 1435 Wound Type: Other (Comment) Location: Heel Location Orientation: Left Present on Admission: Yes   Dressing Type Gauze (Comment)  medihoney get   Dressing Changed Changed  Dressing Status Clean;Old drainage   Dressing Change Frequency Every 3 days   Site / Wound Assessment Friable;Yellow   % Wound base Red or Granulating 5%   % Wound base Yellow 95%   Wound Length (cm) 0.6 cm   Wound Width (cm) 0.5 cm   Wound Depth (cm) 0.3 cm   Margins Unattached edges (unapproximated)   Closure None   Drainage Amount Minimal   Treatment Cleansed;Debridement (Selective)   Wound Properties Date First Assessed: 11/10/15 Time First Assessed: 1455 Wound Type: Other  (Comment) Location: Foot Location Orientation: Left Wound Description (Comments): lateral aspect of transmetatarsal ampuation    Dressing Type Gauze (Comment)  medihoney, 4x4 and gauze    Dressing Changed Changed   Dressing Status Old drainage   Dressing Change Frequency Every 3 days   Site / Wound Assessment Dry;Red;Pink   % Wound base Red or Granulating 100%  after debridment.     % Wound base Yellow 0%   Wound Length (cm) 1.3 cm   Wound Width (cm) 1.3 cm   Wound Depth (cm) 0.1 cm   Margins Unattached edges (unapproximated)   Drainage Amount Minimal   Drainage Description Serous   Treatment Cleansed;Debridement (Selective)   Pulsed lavage therapy - wound location --   Pulsed Lavage with Suction (psi) --   Pulsed Lavage with Suction - Normal Saline Used --   Pulsed Lavage Tip --   Selective Debridement - Location edges and wound bed    Selective Debridement - Tools Used Forceps;Scalpel;Scissors   Selective Debridement - Tissue Removed epiboled edges of fore foot wound; necrotic tissue off heel, slough, callous    Wound Therapy - Clinical Statement Pt returns after 8 days, NS for her last 2 appointments and unable to contact patient by phone.  Explained importance to patient and daughter that she keep her wound appointments.   Debrided alot of slough from heel and remeasured today with overall decrease in size and depth of wounds.  Noted raw area on heel what appears to be an old scar, however patient denies any type of surgery in this area.  Small opening distally, however no drainage or signs of infection.  Appears to just be a friction area.  Padded this area well with dressings and used kerlix today instead of conform to provide more cushioning to area.  Continued with medihoney to heel and xeroform to lateral edge of foot.   Pulse lavage was not utilized as patient now without tunneling or much depth.     Wound Therapy - Functional Problem List increased pain with increased walking .    Factors Delaying/Impairing Wound Healing Altered sensation;Diabetes Mellitus;Multiple medical problems;Polypharmacy;Vascular compromise   Hydrotherapy Plan Debridement;Dressing change   Wound Therapy - Frequency --  2x week for 4 more weeks   Wound Therapy - Current Recommendations PT   Wound Plan Contiue approraite wound care to Bil wounds,    Dressing  medihoney to heel, xeroform to lateral edge of foot, kerlix, #5 netting and surgical bootie                 PT Education - 03/01/16 1710    Education provided Yes   Education Details Instructed to contact office if unable to make appointment.  Asked daughter and patient for alternate phone #, however stated there was no other number.     Person(s) Educated Patient;Child(ren)   Methods Explanation   Comprehension Verbalized understanding  Patient will benefit from skilled therapeutic intervention in order to improve the following deficits and impairments:     Visit Diagnosis: Pain in left foot  Atherosclerosis of native arteries of left leg with ulceration of heel and midfoot ([place])     Problem List Patient Active Problem List   Diagnosis Date Noted  . Ankle fracture, bimalleolar, closed 12/29/2012  . Anemia of unknown etiology 11/06/2012  . Atherosclerotic PVD with ulceration ([place]) 11/04/2012  . Type II or unspecified type diabetes mellitus with peripheral circulatory disorders, uncontrolled(250.72) 11/04/2012  . Tobacco use disorder 11/04/2012  . Open wound of left foot 11/04/2012  . Atherosclerosis of native arteries of the extremities with ulceration(440.23) 07/09/2012  . Weight loss, unintentional 09/18/2011  . Anorexia 09/18/2011  . Early satiety 09/18/2011  . 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/07/2011    Teena Irani, PTA/CLT [phone]  03/01/2016, 5:12 PM  [facility] [address] [place], [place], [address] Phone: [phone]    Fax:  [phone]  Name: TICIA VIRGO MRN: [id number] Date of Birth: [birth year]

## 2016-03-06 ENCOUNTER — Ambulatory Visit (HOSPITAL_COMMUNITY): Payer: Medicare HMO | Admitting: Physical Therapy

## 2016-03-06 DIAGNOSIS — M79672 Pain in left foot: Secondary | ICD-10-CM

## 2016-03-06 DIAGNOSIS — I70244 Atherosclerosis of native arteries of left leg with ulceration of heel and midfoot: Secondary | ICD-10-CM

## 2016-03-06 DIAGNOSIS — R2681 Unsteadiness on feet: Secondary | ICD-10-CM

## 2016-03-06 NOTE — Therapy (Signed)
[place], [place], [address] Phone: [phone]   Fax:  [phone]  Wound Care Therapy  Patient Details  Name: Sherri Brooks MRN: [id number] Date of Birth: 1946/07/23 No Data Recorded  Encounter Date: 03/06/2016      PT End of Session - 03/06/16 1530    Visit Number 16   Number of Visits 24   Date for PT Re-Evaluation 03/23/16   Authorization Type UHC medicare   Authorization Time Period certification on 9/03 dates 01/24/16-03/25/16 ; dont use 4/21 date.  Gcodes updated visits 6 and 16   Authorization - Visit Number 16   Authorization - Number of Visits 26   PT Start Time 1430   PT Stop Time 1500   PT Time Calculation (min) 30 min   Activity Tolerance Patient tolerated treatment well   Behavior During Therapy WFL for tasks assessed/performed      Past Medical History  Diagnosis Date  . Diabetes mellitus   . Hypertension   . Hyperlipidemia   . PVD (peripheral vascular disease) ([place])   . Cellulitis of buttock, left 2010  . Arthritis   . GERD (gastroesophageal reflux disease)   . CAD (coronary artery disease)   . MI (myocardial infarction) ([place])   . Wears glasses     Past Surgical History  Procedure Laterality Date  . Femoral-popliteal bypass graft  07/2010    pt has had 3 fem-pop bpg  . Right common iliac stent  10/2009    Dr Doren Custard ([place])  . Tonsillectomy    . S/p hysterecotmy      partial  . Foot amputation  2010    left  . Colonoscopy  10/26/2011    Procedure: COLONOSCOPY;  Surgeon: Dorothyann Peng, MD;  [facility];  Service: Endoscopy;  Laterality: N/A;  1:30  . Femoral-popliteal bypass graft  11/04/2012    Procedure: BYPASS GRAFT FEMORAL-POPLITEAL ARTERY;  Surgeon: Angelia Mould, MD;  Location: [facility] OR;  Service: Vascular;  Laterality: Left;  Redo Left Femoral Popliteal Bypass with PTFE  . Orif ankle fracture Left 01/27/2013    Procedure: OPEN REDUCTION INTERNAL FIXATION  (ORIF) ANKLE FRACTURE;  Surgeon: Wylene Simmer, MD;  Location: [place];  Service: Orthopedics;  Laterality: Left;  . Abdominal aortagram N/A 07/28/2012    Procedure: ABDOMINAL Maxcine Ham;  Surgeon: Angelia Mould, MD;  Location: [facility] CATH LAB;  Service: Cardiovascular;  Laterality: N/A;  . Lower extremity angiogram Bilateral 07/28/2012    Procedure: LOWER EXTREMITY ANGIOGRAM;  Surgeon: Angelia Mould, MD;  Location: [facility] CATH LAB;  Service: Cardiovascular;  Laterality: Bilateral;  . Percutaneous stent intervention Left 07/28/2012    Procedure: PERCUTANEOUS STENT INTERVENTION;  Surgeon: Angelia Mould, MD;  Location: [facility] CATH LAB;  Service: Cardiovascular;  Laterality: Left;  lt ext tiliac stentx1  . Peripheral vascular catheterization N/A 01/02/2016    Procedure: Abdominal Aortogram;  Surgeon: Angelia Mould, MD;  Location: [place] CV LAB;  Service: Cardiovascular;  Laterality: N/A;  . Lower extremity angiogram Left 01/02/2016    Procedure: Lower Extremity Angiogram;  Surgeon: Angelia Mould, MD;  Location: [place] CV LAB;  Service: Cardiovascular;  Laterality: Left;  . Peripheral vascular catheterization Left 01/02/2016    Procedure: Peripheral Vascular Intervention;  Surgeon: Angelia Mould, MD;  Location: [place] CV LAB;  Service: Cardiovascular;  Laterality: Left;  lt ext iliac    There were no vitals filed for this  visit.                  Wound Therapy - March 22, 2016 1520    Subjective Pt states she is doing well and not hurting like she used to.  States she has some soreness with weight bearing but it used to be 10/10 pain.    Patient and Family Stated Goals wound to heal    Date of Onset 11/05/15   Prior Treatments self care   Pain Assessment No/denies pain   Wound Properties Date First Assessed: 01/05/16 Time First Assessed: 1435 Wound Type: Other (Comment) Location: Heel Location Orientation: Left Present on  Admission: Yes   Dressing Type Gauze (Comment)  medihoney get   Dressing Changed Changed   Dressing Status Clean;Old drainage   Dressing Change Frequency Every 3 days   Site / Wound Assessment Friable;Yellow   % Wound base Red or Granulating 5%   % Wound base Yellow 95%   Margins Unattached edges (unapproximated)   Closure None   Drainage Amount Minimal   Treatment Cleansed;Debridement (Selective)   Wound Properties Date First Assessed: 11/10/15 Time First Assessed: 1455 Wound Type: Other (Comment) Location: Foot Location Orientation: Left Wound Description (Comments): lateral aspect of transmetatarsal ampuation    Dressing Type Gauze (Comment)  medihoney, 4x4 and gauze    Dressing Changed Changed   Dressing Status Old drainage   Dressing Change Frequency Every 3 days   Site / Wound Assessment Dry;Red;Pink   % Wound base Red or Granulating 100%  after debridment.     % Wound base Yellow 0%   Undermining (cm) plantar border   Margins Unattached edges (unapproximated)  attached on dorsal aspect, however planter border undermined   Drainage Amount Minimal   Drainage Description Serous   Treatment Cleansed   Selective Debridement - Location edges and wound bed    Selective Debridement - Tools Used Forceps;Scalpel;Scissors   Selective Debridement - Tissue Removed epiboled edges of fore foot wound; necrotic tissue off heel, slough, callous    Wound Therapy - Clinical Statement Dressings intact.  Pt reporting decreased soreness and overall pain with weight bearing.  Wounds are improving with granulation, however continued difficulty approximating due to thick callous wound border.  Heel remains mostly necrotic with adherence that is difficult to debride.  Changed dressing to hydrogel gauze to see if helps to loosen necrotic tissue.  Also packed a little gauze into plantar border of wound.  No pain vocied during session.     Wound Therapy - Functional Problem List increased pain with  increased walking .   Factors Delaying/Impairing Wound Healing Altered sensation;Diabetes Mellitus;Multiple medical problems;Polypharmacy;Vascular compromise   Hydrotherapy Plan Debridement;Dressing change   Wound Therapy - Frequency --  2x week for 4 more weeks   Wound Therapy - Current Recommendations PT   Wound Plan Contiue approraite wound care to Bil wounds,    Dressing  hydrogel gauze to both wounds, kerlix, #5 netting and surgical bootie                           Patient will benefit from skilled therapeutic intervention in order to improve the following deficits and impairments:     Visit Diagnosis: Pain in left foot  Atherosclerosis of native arteries of left leg with ulceration of heel and midfoot (HCC)  Unsteadiness on feet      G-Codes - 03/22/16 1554    Functional Assessment Tool Used wound size; hx of  amputation; hx of arterial deficiency    Functional Limitation Other PT primary   Other PT Primary Current Status (P7357) At least 20 percent but less than 40 percent impaired, limited or restricted   Other PT Primary Goal Status (I9784) At least 1 percent but less than 20 percent impaired, limited or restricted       Problem List Patient Active Problem List   Diagnosis Date Noted  . Ankle fracture, bimalleolar, closed 12/29/2012  . Anemia of unknown etiology 11/06/2012  . Atherosclerotic PVD with ulceration ([place]) 11/04/2012  . Type II or unspecified type diabetes mellitus with peripheral circulatory disorders, uncontrolled(250.72) 11/04/2012  . Tobacco use disorder 11/04/2012  . Open wound of left foot 11/04/2012  . Atherosclerosis of native arteries of the extremities with ulceration(440.23) 07/09/2012  . Weight loss, unintentional 09/18/2011  . Anorexia 09/18/2011  . Early satiety 09/18/2011  . 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/07/2011    Teena Irani, PTA/CLT [phone]  03/06/2016, 3:54 PM  [facility] [address] [place], [place], [address] Phone: [phone]   Fax:  [phone]  Name: Sherri Brooks MRN: [id number] Date of Birth: 02-10-1946  [name], [place] CLT [phone]

## 2016-03-08 ENCOUNTER — Ambulatory Visit (HOSPITAL_COMMUNITY): Payer: Medicare HMO

## 2016-03-09 ENCOUNTER — Telehealth (HOSPITAL_COMMUNITY): Payer: Self-pay

## 2016-03-09 ENCOUNTER — Ambulatory Visit (HOSPITAL_COMMUNITY): Payer: Medicare HMO

## 2016-03-09 NOTE — Telephone Encounter (Signed)
03/09/16 message left to cx - her transportation backed out

## 2016-03-14 ENCOUNTER — Ambulatory Visit (HOSPITAL_COMMUNITY): Payer: Medicare HMO | Admitting: Physical Therapy

## 2016-03-14 DIAGNOSIS — M79672 Pain in left foot: Secondary | ICD-10-CM

## 2016-03-14 DIAGNOSIS — R2681 Unsteadiness on feet: Secondary | ICD-10-CM

## 2016-03-14 DIAGNOSIS — I70244 Atherosclerosis of native arteries of left leg with ulceration of heel and midfoot: Secondary | ICD-10-CM

## 2016-03-14 NOTE — Therapy (Signed)
[place], [place], [address] Phone: [phone]   Fax:  [phone]  Wound Care Therapy  Patient Details  Name: Sherri Brooks MRN: [id number] Date of Birth: 1945/12/01 No Data Recorded  Encounter Date: 03/14/2016      PT End of Session - 03/14/16 1713    Visit Number 17   Number of Visits 24   Date for PT Re-Evaluation 03/23/16   Authorization Type [employer] medicare   Authorization Time Period certification on 7/59 dates 01/24/16-03/25/16 ; dont use 4/21 date.  Gcodes updated visits 6 and 16   Authorization - Visit Number 17   Authorization - Number of Visits 26   PT Start Time 1638   PT Stop Time 1530   PT Time Calculation (min) 48 min   Activity Tolerance Patient tolerated treatment well   Behavior During Therapy WFL for tasks assessed/performed      Past Medical History  Diagnosis Date  . Diabetes mellitus   . Hypertension   . Hyperlipidemia   . PVD (peripheral vascular disease) ([place])   . Cellulitis of buttock, left 2010  . Arthritis   . GERD (gastroesophageal reflux disease)   . CAD (coronary artery disease)   . MI (myocardial infarction) ([place])   . Wears glasses     Past Surgical History  Procedure Laterality Date  . Femoral-popliteal bypass graft  07/2010    pt has had 3 fem-pop bpg  . Right common iliac stent  10/2009    Dr Doren Custard ([place])  . Tonsillectomy    . S/p hysterecotmy      partial  . Foot amputation  2010    left  . Colonoscopy  10/26/2011    Procedure: COLONOSCOPY;  Surgeon: Dorothyann Peng, MD;  [facility];  Service: Endoscopy;  Laterality: N/A;  1:30  . Femoral-popliteal bypass graft  11/04/2012    Procedure: BYPASS GRAFT FEMORAL-POPLITEAL ARTERY;  Surgeon: Angelia Mould, MD;  Location: [facility] OR;  Service: Vascular;  Laterality: Left;  Redo Left Femoral Popliteal Bypass with PTFE  . Orif ankle fracture Left 01/27/2013    Procedure: OPEN REDUCTION INTERNAL FIXATION  (ORIF) ANKLE FRACTURE;  Surgeon: Wylene Simmer, MD;  Location: [place];  Service: Orthopedics;  Laterality: Left;  . Abdominal aortagram N/A 07/28/2012    Procedure: ABDOMINAL Maxcine Ham;  Surgeon: Angelia Mould, MD;  Location: [facility] CATH LAB;  Service: Cardiovascular;  Laterality: N/A;  . Lower extremity angiogram Bilateral 07/28/2012    Procedure: LOWER EXTREMITY ANGIOGRAM;  Surgeon: Angelia Mould, MD;  Location: [facility] CATH LAB;  Service: Cardiovascular;  Laterality: Bilateral;  . Percutaneous stent intervention Left 07/28/2012    Procedure: PERCUTANEOUS STENT INTERVENTION;  Surgeon: Angelia Mould, MD;  Location: [facility] CATH LAB;  Service: Cardiovascular;  Laterality: Left;  lt ext tiliac stentx1  . Peripheral vascular catheterization N/A 01/02/2016    Procedure: Abdominal Aortogram;  Surgeon: Angelia Mould, MD;  Location: [place] CV LAB;  Service: Cardiovascular;  Laterality: N/A;  . Lower extremity angiogram Left 01/02/2016    Procedure: Lower Extremity Angiogram;  Surgeon: Angelia Mould, MD;  Location: [place] CV LAB;  Service: Cardiovascular;  Laterality: Left;  . Peripheral vascular catheterization Left 01/02/2016    Procedure: Peripheral Vascular Intervention;  Surgeon: Angelia Mould, MD;  Location: [place] CV LAB;  Service: Cardiovascular;  Laterality: Left;  lt ext iliac    There were no vitals filed for this  visit.                  Wound Therapy - 03/14/16 1707    Subjective Pt states her foot is not hurting. Kept dressings intact   Patient and Family Stated Goals wound to heal    Date of Onset 11/05/15   Prior Treatments self care   Pain Assessment No/denies pain   Wound Properties Date First Assessed: 01/05/16 Time First Assessed: 1435 Wound Type: Other (Comment) Location: Heel Location Orientation: Left Present on Admission: Yes   Dressing Type Gauze (Comment)  medihoney gel   Dressing Changed Changed    Dressing Status Clean;Old drainage   Dressing Change Frequency Every 3 days   Site / Wound Assessment Friable;Yellow   % Wound base Red or Granulating 10%   % Wound base Yellow 90%   Margins Unattached edges (unapproximated)   Closure None   Drainage Amount Minimal   Treatment Cleansed;Debridement (Selective)   Wound Properties Date First Assessed: 11/10/15 Time First Assessed: 1455 Wound Type: Other (Comment) Location: Foot Location Orientation: Left Wound Description (Comments): lateral aspect of transmetatarsal ampuation    Dressing Type Gauze (Comment)  xeroform   Dressing Changed Changed   Dressing Status Old drainage   Dressing Change Frequency Every 3 days   Site / Wound Assessment Dry;Red;Pink   % Wound base Red or Granulating 100%  after debridment.     % Wound base Yellow 0%   Margins Unattached edges (unapproximated)  attached on dorsal aspect, however planter border undermined   Drainage Amount Minimal   Drainage Description Serous   Treatment Cleansed;Debridement (Selective)   Selective Debridement - Location edges and wound bed    Selective Debridement - Tools Used Forceps;Scalpel;Scissors   Selective Debridement - Tissue Removed epiboled edges of fore foot wound; necrotic tissue off heel, slough, callous    Wound Therapy - Clinical Statement Dressings intact.  Spent large amount of time debrideing callous from perimeter of wounds to promote approximation.  Changed dressing on forefoot to xeroform and continued using medihoney gel on heel. Moisturized border well before applying dressing.  Continued with medipore tape to keep dressings in place.  Moisturized remaining foot and dressed with kerlix and netting.  Pt reported comfort with dressing at end of session.    Wound Therapy - Functional Problem List increased pain with increased walking .   Factors Delaying/Impairing Wound Healing Altered sensation;Diabetes Mellitus;Multiple medical problems;Polypharmacy;Vascular  compromise   Hydrotherapy Plan Debridement;Dressing change   Wound Therapy - Frequency --  2x week for 4 more weeks   Wound Therapy - Current Recommendations PT   Wound Plan Continue appropriate wound care to Bil wounds,    Dressing  medihoney gauze to heel and xeroform to forefoot.                           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 Ankle fracture, bimalleolar, closed 12/29/2012  . Anemia of unknown etiology 11/06/2012  . Atherosclerotic PVD with ulceration ([place]) 11/04/2012  . Type II or unspecified type diabetes mellitus with peripheral circulatory disorders, uncontrolled(250.72) 11/04/2012  . Tobacco use disorder 11/04/2012  .  Open wound of left foot 11/04/2012  . Atherosclerosis of native arteries of the extremities with ulceration(440.23) 07/09/2012  . Weight loss, unintentional 09/18/2011  . Anorexia 09/18/2011  . Early satiety 09/18/2011  . 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/07/2011    Teena Irani, PTA/CLT [phone]  03/14/2016, 5:17 PM  [facility] [address] [place], [place], [address] Phone: [phone]   Fax:  [phone]  Name: Sherri Brooks MRN: [id number] Date of Birth: 05-01-1946

## 2016-03-16 ENCOUNTER — Ambulatory Visit (HOSPITAL_COMMUNITY): Payer: Medicare HMO | Attending: Family Medicine

## 2016-03-16 ENCOUNTER — Telehealth (HOSPITAL_COMMUNITY): Payer: Self-pay

## 2016-03-16 DIAGNOSIS — I70244 Atherosclerosis of native arteries of left leg with ulceration of heel and midfoot: Secondary | ICD-10-CM | POA: Insufficient documentation

## 2016-03-16 DIAGNOSIS — R2681 Unsteadiness on feet: Secondary | ICD-10-CM | POA: Insufficient documentation

## 2016-03-16 DIAGNOSIS — I70334 Atherosclerosis of unspecified type of bypass graft(s) of the right leg with ulceration of heel and midfoot: Secondary | ICD-10-CM | POA: Insufficient documentation

## 2016-03-16 DIAGNOSIS — M79672 Pain in left foot: Secondary | ICD-10-CM | POA: Insufficient documentation

## 2016-03-16 NOTE — Telephone Encounter (Signed)
No show, tried to call, mail box full.    [address] [place]; [facility] [phone]

## 2016-03-20 ENCOUNTER — Ambulatory Visit (HOSPITAL_COMMUNITY): Payer: Medicare HMO | Admitting: Physical Therapy

## 2016-03-20 DIAGNOSIS — I70334 Atherosclerosis of unspecified type of bypass graft(s) of the right leg with ulceration of heel and midfoot: Secondary | ICD-10-CM | POA: Diagnosis not present

## 2016-03-20 DIAGNOSIS — R2681 Unsteadiness on feet: Secondary | ICD-10-CM

## 2016-03-20 DIAGNOSIS — M79672 Pain in left foot: Secondary | ICD-10-CM | POA: Diagnosis not present

## 2016-03-20 DIAGNOSIS — I70244 Atherosclerosis of native arteries of left leg with ulceration of heel and midfoot: Secondary | ICD-10-CM | POA: Diagnosis not present

## 2016-03-20 NOTE — Addendum Note (Signed)
Addended by: Hunt Oris on: 03/20/2016 04:17 PM   Modules accepted: Orders

## 2016-03-20 NOTE — Therapy (Signed)
[place], [place], [address] Phone: [phone]   Fax:  [phone]  Wound Care Therapy (Re-Assessment)  Patient Details  Name: Sherri Brooks MRN: [id number] Date of Birth: 1946/07/18 No Data Recorded  Encounter Date: 03/20/2016      PT End of Session - 03/20/16 1608    Visit Number 18   Number of Visits 26   Date for PT Re-Evaluation 04/17/16   Authorization Type UHC medicare   Authorization Time Period certification on 1/82 dates 01/24/16-03/25/16 ; dont use 4/21 date.  Gcodes updated visits 6, 16, and 18   Authorization - Visit Number 18   Authorization - Number of Visits 28   PT Start Time 9937   PT Stop Time 1550  completed skilled wound care    PT Time Calculation (min) 34 min   Activity Tolerance Patient tolerated treatment well   Behavior During Therapy WFL for tasks assessed/performed      Past Medical History  Diagnosis Date  . Diabetes mellitus   . Hypertension   . Hyperlipidemia   . PVD (peripheral vascular disease) ([place])   . Cellulitis of buttock, left 2010  . Arthritis   . GERD (gastroesophageal reflux disease)   . CAD (coronary artery disease)   . MI (myocardial infarction) ([place])   . Wears glasses     Past Surgical History  Procedure Laterality Date  . Femoral-popliteal bypass graft  07/2010    pt has had 3 fem-pop bpg  . Right common iliac stent  10/2009    Dr Doren Custard ([place])  . Tonsillectomy    . S/p hysterecotmy      partial  . Foot amputation  2010    left  . Colonoscopy  10/26/2011    Procedure: COLONOSCOPY;  Surgeon: Dorothyann Peng, MD;  [facility];  Service: Endoscopy;  Laterality: N/A;  1:30  . Femoral-popliteal bypass graft  11/04/2012    Procedure: BYPASS GRAFT FEMORAL-POPLITEAL ARTERY;  Surgeon: Angelia Mould, MD;  Location: [facility] OR;  Service: Vascular;  Laterality: Left;  Redo Left Femoral Popliteal Bypass with PTFE  . Orif ankle fracture Left 01/27/2013     Procedure: OPEN REDUCTION INTERNAL FIXATION (ORIF) ANKLE FRACTURE;  Surgeon: Wylene Simmer, MD;  Location: [place];  Service: Orthopedics;  Laterality: Left;  . Abdominal aortagram N/A 07/28/2012    Procedure: ABDOMINAL Maxcine Ham;  Surgeon: Angelia Mould, MD;  Location: [facility] CATH LAB;  Service: Cardiovascular;  Laterality: N/A;  . Lower extremity angiogram Bilateral 07/28/2012    Procedure: LOWER EXTREMITY ANGIOGRAM;  Surgeon: Angelia Mould, MD;  Location: [facility] CATH LAB;  Service: Cardiovascular;  Laterality: Bilateral;  . Percutaneous stent intervention Left 07/28/2012    Procedure: PERCUTANEOUS STENT INTERVENTION;  Surgeon: Angelia Mould, MD;  Location: [facility] CATH LAB;  Service: Cardiovascular;  Laterality: Left;  lt ext tiliac stentx1  . Peripheral vascular catheterization N/A 01/02/2016    Procedure: Abdominal Aortogram;  Surgeon: Angelia Mould, MD;  Location: [place] CV LAB;  Service: Cardiovascular;  Laterality: N/A;  . Lower extremity angiogram Left 01/02/2016    Procedure: Lower Extremity Angiogram;  Surgeon: Angelia Mould, MD;  Location: [place] CV LAB;  Service: Cardiovascular;  Laterality: Left;  . Peripheral vascular catheterization Left 01/02/2016    Procedure: Peripheral Vascular Intervention;  Surgeon: Angelia Mould, MD;  Location: [place] CV LAB;  Service: Cardiovascular;  Laterality: Left;  lt ext iliac  There were no vitals filed for this visit.                  Wound Therapy - 03/20/16 1558    Subjective Patient states no foot pain today, dressings remained intact and in place since last session    Patient and Family Stated Goals wound to heal    Date of Onset 11/05/15   Prior Treatments self care   Pain Assessment No/denies pain   Evaluation and Treatment Procedures Explained to Patient/Family Yes   Evaluation and Treatment Procedures agreed to   Wound Properties Date First Assessed:  01/05/16 Time First Assessed: 1435 Wound Type: Other (Comment) Location: Heel Location Orientation: Left Present on Admission: Yes   Dressing Type Gauze (Comment)   Dressing Changed Changed   Dressing Status Clean;Old drainage   Dressing Change Frequency Every 3 days   Site / Wound Assessment Friable;Yellow   % Wound base Red or Granulating 0%   % Wound base Yellow 100%   Wound Length (cm) 0.7 cm   Wound Width (cm) 0.6 cm   Wound Depth (cm) 0.3 cm   Margins Unattached edges (unapproximated)   Closure None   Drainage Amount Minimal   Treatment Cleansed;Other (Comment)  cleansed, medihoney, gauze/tape dressing   Wound Properties Date First Assessed: 11/10/15 Time First Assessed: 1455 Wound Type: Other (Comment) Location: Foot Location Orientation: Left Wound Description (Comments): lateral aspect of transmetatarsal ampuation    Dressing Type Gauze (Comment)   Dressing Changed Changed   Dressing Status Old drainage   Dressing Change Frequency Every 3 days   Site / Wound Assessment Dry;Red;Pink;Yellow   % Wound base Red or Granulating 75%   % Wound base Yellow 25%   Wound Length (cm) 1.1 cm   Wound Width (cm) 0.8 cm   Margins Unattached edges (unapproximated)   Drainage Amount Minimal   Drainage Description Serous   Treatment Cleansed;Debridement (Selective);Other (Comment)  cleansed, debridement, medihoney, gauze /tape dressing   Selective Debridement - Location edges and wound bed    Selective Debridement - Tools Used Forceps   Selective Debridement - Tissue Removed biofilm off of metatarsal wound    Wound Therapy - Clinical Statement Re-assessment performed today. While wounds do appear to be reducing in size, heel wound continues to have a soft, mushy quality and is 100% slough at this point; continued to dress this wound using medihoney and gauze/tape dressing. Wound on metatarsal did appear to have biofilm today which was able to be removed via cleansing and skilled  debridement,and did change the dressing on this wound to medihoney to assist in addressing biofilm today; this wound does otehrwise appear to be doing well with some decrease in size however does demonstrate some epibole around edges. At this point recommend continuing skilled PT services in order to address remaining wounds and facilitate optimal wound healing.    Wound Therapy - Functional Problem List increased pain with increased walking .   Factors Delaying/Impairing Wound Healing Altered sensation;Diabetes Mellitus;Multiple medical problems;Polypharmacy;Vascular compromise   Hydrotherapy Plan Debridement;Dressing change   Wound Therapy - Current Recommendations PT   Wound Plan Continue appropriate wound care to Bil wounds,    Dressing  medihoney with gauze/tape dressing to both areas                  PT Education - 03/20/16 1608    Education provided Yes   Education Details cotninue with skilled PT services for wound care    Person(s) Educated  Patient;Child(ren)   Methods Explanation   Comprehension Verbalized understanding                  Patient will benefit from skilled therapeutic intervention in order to improve the following deficits and impairments:     Visit Diagnosis: Pain in left foot  Atherosclerosis 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 Ankle fracture, bimalleolar, closed 12/29/2012  . Anemia of unknown etiology 11/06/2012  . Atherosclerotic  PVD with ulceration ([place]) 11/04/2012  . Type II or unspecified type diabetes mellitus with peripheral circulatory disorders, uncontrolled(250.72) 11/04/2012  . Tobacco use disorder 11/04/2012  . Open wound of left foot 11/04/2012  . Atherosclerosis of native arteries of the extremities with ulceration(440.23) 07/09/2012  . Weight loss, unintentional 09/18/2011  . Anorexia 09/18/2011  . Early satiety 09/18/2011  . 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/07/2011    Deniece Ree PT, DPT [place] [address] [place], [place], [address] Phone: [phone]   Fax:  [phone]  Name: Sherri Brooks MRN: [id number] Date of Birth: 1946-09-09

## 2016-03-22 ENCOUNTER — Ambulatory Visit (HOSPITAL_COMMUNITY): Payer: Medicare HMO | Admitting: Physical Therapy

## 2016-03-22 ENCOUNTER — Telehealth (HOSPITAL_COMMUNITY): Payer: Self-pay | Admitting: Physical Therapy

## 2016-03-22 NOTE — Telephone Encounter (Signed)
Attempted to call pt RE:  Missed appointment.  No answer and mailbox is full.  [name], [place] CLT [phone]

## 2016-03-27 ENCOUNTER — Ambulatory Visit (HOSPITAL_COMMUNITY): Payer: Medicare HMO

## 2016-03-27 DIAGNOSIS — M79672 Pain in left foot: Secondary | ICD-10-CM

## 2016-03-27 DIAGNOSIS — I70244 Atherosclerosis of native arteries of left leg with ulceration of heel and midfoot: Secondary | ICD-10-CM

## 2016-03-27 DIAGNOSIS — I70334 Atherosclerosis of unspecified type of bypass graft(s) of the right leg with ulceration of heel and midfoot: Secondary | ICD-10-CM

## 2016-03-27 DIAGNOSIS — R2681 Unsteadiness on feet: Secondary | ICD-10-CM

## 2016-03-27 NOTE — Therapy (Signed)
[place], [place], [address] Phone: [phone]   Fax:  [phone]  Wound Care Therapy  Patient Details  Name: Sherri Brooks MRN: [id number] Date of Birth: 1946/07/17 No Data Recorded  Encounter Date: 03/27/2016      PT End of Session - 03/27/16 1803    Visit Number 19   Number of Visits 2   Date for PT Re-Evaluation 04/17/16   Authorization Type UHC medicare   Authorization Time Period certification on 5/42 dates 01/24/16-03/25/16 ; dont use 4/21 date.  Gcodes updated visits 6, 16, and 18   Authorization - Visit Number 19   Authorization - Number of Visits 28   PT Start Time 1522   PT Stop Time 1558   PT Time Calculation (min) 36 min   Activity Tolerance Patient tolerated treatment well   Behavior During Therapy WFL for tasks assessed/performed      Past Medical History  Diagnosis Date  . Diabetes mellitus   . Hypertension   . Hyperlipidemia   . PVD (peripheral vascular disease) ([place])   . Cellulitis of buttock, left 2010  . Arthritis   . GERD (gastroesophageal reflux disease)   . CAD (coronary artery disease)   . MI (myocardial infarction) ([place])   . Wears glasses     Past Surgical History  Procedure Laterality Date  . Femoral-popliteal bypass graft  07/2010    pt has had 3 fem-pop bpg  . Right common iliac stent  10/2009    Dr Doren Custard ([place])  . Tonsillectomy    . S/p hysterecotmy      partial  . Foot amputation  2010    left  . Colonoscopy  10/26/2011    Procedure: COLONOSCOPY;  Surgeon: Dorothyann Peng, MD;  [facility];  Service: Endoscopy;  Laterality: N/A;  1:30  . Femoral-popliteal bypass graft  11/04/2012    Procedure: BYPASS GRAFT FEMORAL-POPLITEAL ARTERY;  Surgeon: Angelia Mould, MD;  Location: [facility] OR;  Service: Vascular;  Laterality: Left;  Redo Left Femoral Popliteal Bypass with PTFE  . Orif ankle fracture Left 01/27/2013    Procedure: OPEN REDUCTION INTERNAL FIXATION  (ORIF) ANKLE FRACTURE;  Surgeon: Wylene Simmer, MD;  Location: [place];  Service: Orthopedics;  Laterality: Left;  . Abdominal aortagram N/A 07/28/2012    Procedure: ABDOMINAL Maxcine Ham;  Surgeon: Angelia Mould, MD;  Location: [facility] CATH LAB;  Service: Cardiovascular;  Laterality: N/A;  . Lower extremity angiogram Bilateral 07/28/2012    Procedure: LOWER EXTREMITY ANGIOGRAM;  Surgeon: Angelia Mould, MD;  Location: [facility] CATH LAB;  Service: Cardiovascular;  Laterality: Bilateral;  . Percutaneous stent intervention Left 07/28/2012    Procedure: PERCUTANEOUS STENT INTERVENTION;  Surgeon: Angelia Mould, MD;  Location: [facility] CATH LAB;  Service: Cardiovascular;  Laterality: Left;  lt ext tiliac stentx1  . Peripheral vascular catheterization N/A 01/02/2016    Procedure: Abdominal Aortogram;  Surgeon: Angelia Mould, MD;  Location: [place] CV LAB;  Service: Cardiovascular;  Laterality: N/A;  . Lower extremity angiogram Left 01/02/2016    Procedure: Lower Extremity Angiogram;  Surgeon: Angelia Mould, MD;  Location: [place] CV LAB;  Service: Cardiovascular;  Laterality: Left;  . Peripheral vascular catheterization Left 01/02/2016    Procedure: Peripheral Vascular Intervention;  Surgeon: Angelia Mould, MD;  Location: [place] CV LAB;  Service: Cardiovascular;  Laterality: Left;  lt ext iliac    There were no vitals filed for  this visit.       Subjective Assessment - 03/27/16 1610    Subjective Pt stated she has some heel pain with weight bearing, current pain scale 2-3/10, dressing intact   Currently in Pain? Yes   Pain Score 3             Wound Therapy - 03/27/16 1610    Subjective Pt stated she has some heel pain with weight bearing, current pain scale 2-3/10, dressing intact   Patient and Family Stated Goals wound to heal    Date of Onset 11/05/15   Prior Treatments self care   Pain Assessment 0-10   Pain Type Chronic pain    Pain Location Heel   Pain Orientation Left   Pain Descriptors / Indicators Discomfort   Pain Onset Sudden   Patients Stated Pain Goal 0   Pain Intervention(s) Emotional support   Multiple Pain Sites Yes   Evaluation and Treatment Procedures Explained to Patient/Family Yes   Evaluation and Treatment Procedures agreed to   Wound Properties Date First Assessed: 01/05/16 Time First Assessed: 1435 Wound Type: Other (Comment) Location: Heel Location Orientation: Left Present on Admission: Yes   Dressing Type Gauze (Comment)  medihoney, medipore tape, gauze and netting   Dressing Changed Changed   Dressing Status Clean;Old drainage   Dressing Change Frequency Every 3 days   Site / Wound Assessment Friable;Yellow   % Wound base Red or Granulating 0%   % Wound base Yellow 100%   Margins Unattached edges (unapproximated)   Closure None   Drainage Amount Minimal   Treatment Cleansed;Debridement (Selective)   Wound Properties Date First Assessed: 11/10/15 Time First Assessed: 1455 Wound Type: Other (Comment) Location: Foot Location Orientation: Left Wound Description (Comments): lateral aspect of transmetatarsal ampuation    Dressing Type Impregnated gauze (petrolatum);Gauze (Comment)  xerform, medipore tape, gauze and netting   Dressing Changed Changed   Dressing Status Old drainage   Dressing Change Frequency Every 3 days   Site / Wound Assessment Dry;Red;Pink;Yellow   % Wound base Red or Granulating 75%   % Wound base Yellow 25%   Margins Unattached edges (unapproximated)   Drainage Amount Minimal   Drainage Description Serous   Treatment Cleansed;Debridement (Selective)   Selective Debridement - Location edges and wound bed    Selective Debridement - Tools Used Forceps;Scalpel   Selective Debridement - Tissue Removed biofilm off of metatarsal wound; slough from heel and edipole edges   Wound Therapy - Clinical Statement Selective debridement for removal of biofilm metatarsal region and  slough from heel.  Heel continues to be mushy and soft, decreased overall in size.  Continued with xeroform to metatarsal wound and packed medihoney into heel with medipore tape and gauze.  No reports of pain through session.     Wound Therapy - Functional Problem List increased pain with increased walking .   Factors Delaying/Impairing Wound Healing Altered sensation;Diabetes Mellitus;Multiple medical problems;Polypharmacy;Vascular compromise   Hydrotherapy Plan Debridement;Dressing change   Wound Therapy - Current Recommendations PT   Wound Plan Continue appropriate wound care to Bil wounds,    Dressing  xeroform to metatarsal; medihoney with tape and gauze dressings          Patient will benefit from skilled therapeutic intervention in order to improve the following deficits and impairments:     Visit Diagnosis: Pain in left foot  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 Ankle fracture, bimalleolar, closed 12/29/2012  . Anemia of unknown etiology 11/06/2012  . Atherosclerotic PVD with ulceration ([place]) 11/04/2012  . Type II or unspecified type diabetes mellitus with peripheral circulatory disorders, uncontrolled(250.72) 11/04/2012  . Tobacco use disorder 11/04/2012  . Open wound of left foot 11/04/2012  . Atherosclerosis of native arteries of the extremities with ulceration(440.23) 07/09/2012  . Weight loss, unintentional 09/18/2011  . Anorexia 09/18/2011  . Early satiety 09/18/2011  . 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/07/2011   Ihor Austin, LPTA; CBIS [phone]  Aldona Lento 03/27/2016, 6:04 PM  [place], [place], [address] Phone: [phone]   Fax:  [phone]  Name: Sherri Brooks MRN: [id number] Date of Birth: 1946/02/23

## 2016-03-29 ENCOUNTER — Ambulatory Visit (HOSPITAL_COMMUNITY): Payer: Medicare HMO | Admitting: Physical Therapy

## 2016-03-29 ENCOUNTER — Telehealth (HOSPITAL_COMMUNITY): Payer: Self-pay | Admitting: Physical Therapy

## 2016-03-29 NOTE — Telephone Encounter (Signed)
Patient cx due to lack of transportation

## 2016-04-04 ENCOUNTER — Ambulatory Visit (HOSPITAL_COMMUNITY): Payer: Medicare HMO

## 2016-04-04 DIAGNOSIS — M79672 Pain in left foot: Secondary | ICD-10-CM

## 2016-04-04 DIAGNOSIS — R2681 Unsteadiness on feet: Secondary | ICD-10-CM

## 2016-04-04 DIAGNOSIS — I70244 Atherosclerosis of native arteries of left leg with ulceration of heel and midfoot: Secondary | ICD-10-CM

## 2016-04-04 NOTE — Therapy (Signed)
Sherri [place], [place], [address] Phone: [phone]   Fax:  [phone]  Wound Care Therapy  Patient Details  Name: Sherri Brooks MRN: [id number] Date of Birth: Apr 08, 1946 No Data Recorded  Encounter Date: 04/04/2016      PT End of Session - 04/04/16 1433    Visit Number 20   Number of Visits 265   Date for PT Re-Evaluation 04/17/16   Authorization Type UHC medicare   Authorization Time Period certification on 6/96 dates 01/24/16-03/25/16 ; dont use 4/21 date.  Gcodes updated visits 6, 16, and 18   Authorization - Visit Number 20   Authorization - Number of Visits 28   PT Start Time 1350   PT Stop Time 1428   PT Time Calculation (min) 38 min   Activity Tolerance Patient tolerated treatment well   Behavior During Therapy WFL for tasks assessed/performed      Past Medical History  Diagnosis Date  . Diabetes mellitus   . Hypertension   . Hyperlipidemia   . PVD (peripheral vascular disease) ([place])   . Cellulitis of buttock, left 2010  . Arthritis   . GERD (gastroesophageal reflux disease)   . CAD (coronary artery disease)   . MI (myocardial infarction) ([place])   . Wears glasses     Past Surgical History  Procedure Laterality Date  . Femoral-popliteal bypass graft  07/2010    pt has had 3 fem-pop bpg  . Right common iliac stent  10/2009    Dr Doren Custard ([place])  . Tonsillectomy    . S/p hysterecotmy      partial  . Foot amputation  2010    left  . Colonoscopy  10/26/2011    Procedure: COLONOSCOPY;  Surgeon: Dorothyann Peng, MD;  [facility];  Service: Endoscopy;  Laterality: N/A;  1:30  . Femoral-popliteal bypass graft  11/04/2012    Procedure: BYPASS GRAFT FEMORAL-POPLITEAL ARTERY;  Surgeon: Angelia Mould, MD;  Location: [facility] OR;  Service: Vascular;  Laterality: Left;  Redo Left Femoral Popliteal Bypass with PTFE  . Orif ankle fracture Left 01/27/2013    Procedure: OPEN REDUCTION INTERNAL  FIXATION (ORIF) ANKLE FRACTURE;  Surgeon: Wylene Simmer, MD;  Location: [place];  Service: Orthopedics;  Laterality: Left;  . Abdominal aortagram N/A 07/28/2012    Procedure: ABDOMINAL Maxcine Ham;  Surgeon: Angelia Mould, MD;  Location: [facility] CATH LAB;  Service: Cardiovascular;  Laterality: N/A;  . Lower extremity angiogram Bilateral 07/28/2012    Procedure: LOWER EXTREMITY ANGIOGRAM;  Surgeon: Angelia Mould, MD;  Location: [facility] CATH LAB;  Service: Cardiovascular;  Laterality: Bilateral;  . Percutaneous stent intervention Left 07/28/2012    Procedure: PERCUTANEOUS STENT INTERVENTION;  Surgeon: Angelia Mould, MD;  Location: [facility] CATH LAB;  Service: Cardiovascular;  Laterality: Left;  lt ext tiliac stentx1  . Peripheral vascular catheterization N/A 01/02/2016    Procedure: Abdominal Aortogram;  Surgeon: Angelia Mould, MD;  Location: [place] CV LAB;  Service: Cardiovascular;  Laterality: N/A;  . Lower extremity angiogram Left 01/02/2016    Procedure: Lower Extremity Angiogram;  Surgeon: Angelia Mould, MD;  Location: [place] CV LAB;  Service: Cardiovascular;  Laterality: Left;  . Peripheral vascular catheterization Left 01/02/2016    Procedure: Peripheral Vascular Intervention;  Surgeon: Angelia Mould, MD;  Location: [place] CV LAB;  Service: Cardiovascular;  Laterality: Left;  lt ext iliac    There were no vitals filed for  this visit.       Subjective Assessment - 04/04/16 1449    Subjective Pt stated foot felt good today with no reports of pain and dressings intact.   Currently in Pain? No/denies                   Wound Therapy - 04/04/16 1450    Subjective Pt stated foot felt good today with no reports of pain and dressings intact.   Patient and Family Stated Goals wound to heal    Date of Onset 11/05/15   Prior Treatments self care   Pain Assessment No/denies pain   Pain Score 0-No pain   Evaluation and  Treatment Procedures Explained to Patient/Family Yes   Evaluation and Treatment Procedures agreed to   Wound Properties Date First Assessed: 01/05/16 Time First Assessed: 1435 Wound Type: Other (Comment) Location: Heel Location Orientation: Left Present on Admission: Yes   Dressing Type Gauze (Comment)  medihoney, 2x2, medipore tape and gauze   Dressing Changed Changed   Dressing Status Clean;Old drainage   Dressing Change Frequency Every 3 days   Site / Wound Assessment Friable;Yellow   % Wound base Red or Granulating 30%   % Wound base Yellow 50%   % Wound base Black 0%   % Wound base Other (Comment) 20%  biofilm   Wound Length (cm) 0.7 cm   Wound Width (cm) 0.7 cm   Wound Depth (cm) 0.3 cm   Margins Epibole (rolled edges)   Closure None   Drainage Amount Minimal   Drainage Description Serosanguineous   Treatment Cleansed;Debridement (Selective)   Wound Properties Date First Assessed: 11/10/15 Time First Assessed: 1455 Wound Type: Other (Comment) Location: Foot Location Orientation: Left Wound Description (Comments): lateral aspect of transmetatarsal ampuation    Dressing Type Impregnated gauze (petrolatum);Gauze (Comment)  xeroform, medipore, gauze   Dressing Changed Changed   Dressing Status Old drainage   Dressing Change Frequency Every 3 days   Site / Wound Assessment Dry;Red;Pink;Yellow   % Wound base Red or Granulating 90%   % Wound base Other (Comment) 10%  biofilm   Wound Length (cm) 1 cm   Wound Width (cm) 0.8 cm   Wound Depth (cm) 0.1 cm   Margins Unattached edges (unapproximated)   Drainage Amount Scant   Drainage Description Serous   Treatment Cleansed;Debridement (Selective)   Selective Debridement - Location edges and wound bed    Selective Debridement - Tools Used Forceps;Scalpel   Selective Debridement - Tissue Removed biofilm off of metatarsal and heel wound; slough from heel and edipole edges   Wound Therapy - Clinical Statement Continued selective  debridement for removal of biofilm and slough from metatarsal region and heel wounds.  Selective debridement to assist with epidole edges on heel to promote healing.  Heel continues to be soft and mushy though do noted decreased overall drainage.  Continued with medihoney to heel and xeroform to promote healing with 2x2, medipore tape, gauze, netting and foot cover.  No reports of pain through session.     Wound Therapy - Functional Problem List increased pain with increased walking .   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 Ankle fracture, bimalleolar, closed 12/29/2012  . Anemia of unknown etiology 11/06/2012  . Atherosclerotic PVD with ulceration ([place]) 11/04/2012  . Type II or unspecified type diabetes mellitus with peripheral circulatory disorders, uncontrolled(250.72) 11/04/2012  . Tobacco use disorder 11/04/2012  . Open wound of left foot 11/04/2012  . Atherosclerosis of native arteries of the extremities with ulceration(440.23) 07/09/2012  . Weight loss, unintentional 09/18/2011  . Anorexia 09/18/2011  . Early satiety 09/18/2011  . 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/07/2011   Sherri Brooks, LPTA; CBIS [phone]  Sherri Brooks 04/04/2016, 5:53 PM  [place], [place], [address] Phone: [phone]   Fax:  [phone]  Name: Sherri Brooks MRN: [id number] Date of Birth: [birth year]

## 2016-04-06 ENCOUNTER — Telehealth (HOSPITAL_COMMUNITY): Payer: Self-pay | Admitting: Physical Therapy

## 2016-04-06 ENCOUNTER — Ambulatory Visit (HOSPITAL_COMMUNITY): Payer: Medicare HMO | Admitting: Physical Therapy

## 2016-04-06 NOTE — Telephone Encounter (Signed)
Called pt re: missed appointment.  Pt thought that her appointment was on Monday.  Pt has no appointment on Monday.  Explained to pt that her next visit will be on Wed. At 1:45.   [name], [place] CLT [phone]

## 2016-04-11 ENCOUNTER — Telehealth (HOSPITAL_COMMUNITY): Payer: Self-pay | Admitting: Physical Therapy

## 2016-04-11 ENCOUNTER — Ambulatory Visit (HOSPITAL_COMMUNITY): Payer: Medicare HMO | Admitting: Physical Therapy

## 2016-04-11 NOTE — Telephone Encounter (Signed)
Pt did not show for appt.  Called and received voicemail, however mailbox was full and unable to leave message.  Teena Irani, PTA/CLT [phone]

## 2016-04-16 ENCOUNTER — Ambulatory Visit (HOSPITAL_COMMUNITY): Payer: Medicare HMO | Attending: Family Medicine | Admitting: Physical Therapy

## 2016-04-16 DIAGNOSIS — I70244 Atherosclerosis of native arteries of left leg with ulceration of heel and midfoot: Secondary | ICD-10-CM | POA: Diagnosis not present

## 2016-04-16 DIAGNOSIS — R2681 Unsteadiness on feet: Secondary | ICD-10-CM | POA: Diagnosis not present

## 2016-04-16 DIAGNOSIS — I70334 Atherosclerosis of unspecified type of bypass graft(s) of the right leg with ulceration of heel and midfoot: Secondary | ICD-10-CM | POA: Insufficient documentation

## 2016-04-16 DIAGNOSIS — M79672 Pain in left foot: Secondary | ICD-10-CM | POA: Insufficient documentation

## 2016-04-16 NOTE — Therapy (Signed)
[place], [place], [address] Phone: [phone]   Fax:  [phone]  Wound Care Therapy  Patient Details  Name: Sherri Brooks MRN: [id number] Date of Birth: April 20, 1946 No Data Recorded  Encounter Date: 04/16/2016      PT End of Session - 04/16/16 1615    Visit Number 21   Number of Visits 265   Date for PT Re-Evaluation 04/17/16   Authorization Type [employer] medicare   Authorization Time Period certification on 7/16 dates 01/24/16-03/25/16 ; dont use 4/21 date.  Gcodes updated visits 6, 16, and 18   Authorization - Visit Number 21   Authorization - Number of Visits 28   PT Start Time 1525   PT Stop Time 1600   PT Time Calculation (min) 35 min   Activity Tolerance Patient tolerated treatment well   Behavior During Therapy WFL for tasks assessed/performed      Past Medical History  Diagnosis Date  . Diabetes mellitus   . Hypertension   . Hyperlipidemia   . PVD (peripheral vascular disease) ([place])   . Cellulitis of buttock, left 2010  . Arthritis   . GERD (gastroesophageal reflux disease)   . CAD (coronary artery disease)   . MI (myocardial infarction) ([place])   . Wears glasses     Past Surgical History  Procedure Laterality Date  . Femoral-popliteal bypass graft  07/2010    pt has had 3 fem-pop bpg  . Right common iliac stent  10/2009    Dr Doren Custard ([place])  . Tonsillectomy    . S/p hysterecotmy      partial  . Foot amputation  2010    left  . Colonoscopy  10/26/2011    Procedure: COLONOSCOPY;  Surgeon: Dorothyann Peng, MD;  [facility];  Service: Endoscopy;  Laterality: N/A;  1:30  . Femoral-popliteal bypass graft  11/04/2012    Procedure: BYPASS GRAFT FEMORAL-POPLITEAL ARTERY;  Surgeon: Angelia Mould, MD;  Location: [facility] OR;  Service: Vascular;  Laterality: Left;  Redo Left Femoral Popliteal Bypass with PTFE  . Orif ankle fracture Left 01/27/2013    Procedure: OPEN REDUCTION INTERNAL FIXATION  (ORIF) ANKLE FRACTURE;  Surgeon: Wylene Simmer, MD;  Location: [place];  Service: Orthopedics;  Laterality: Left;  . Abdominal aortagram N/A 07/28/2012    Procedure: ABDOMINAL Maxcine Ham;  Surgeon: Angelia Mould, MD;  Location: [facility] CATH LAB;  Service: Cardiovascular;  Laterality: N/A;  . Lower extremity angiogram Bilateral 07/28/2012    Procedure: LOWER EXTREMITY ANGIOGRAM;  Surgeon: Angelia Mould, MD;  Location: [facility] CATH LAB;  Service: Cardiovascular;  Laterality: Bilateral;  . Percutaneous stent intervention Left 07/28/2012    Procedure: PERCUTANEOUS STENT INTERVENTION;  Surgeon: Angelia Mould, MD;  Location: [facility] CATH LAB;  Service: Cardiovascular;  Laterality: Left;  lt ext tiliac stentx1  . Peripheral vascular catheterization N/A 01/02/2016    Procedure: Abdominal Aortogram;  Surgeon: Angelia Mould, MD;  Location: [place] CV LAB;  Service: Cardiovascular;  Laterality: N/A;  . Lower extremity angiogram Left 01/02/2016    Procedure: Lower Extremity Angiogram;  Surgeon: Angelia Mould, MD;  Location: [place] CV LAB;  Service: Cardiovascular;  Laterality: Left;  . Peripheral vascular catheterization Left 01/02/2016    Procedure: Peripheral Vascular Intervention;  Surgeon: Angelia Mould, MD;  Location: [place] CV LAB;  Service: Cardiovascular;  Laterality: Left;  lt ext iliac    There were no vitals filed for  this visit.                  Wound Therapy - 04/16/16 1605    Subjective Ms. Behringer states that she knows that her wounds would heal better if she would come to treatment on a regular basis but she relies on others for transportation    Patient and Family Stated Goals wound to heal    Date of Onset 11/05/15   Prior Treatments self care   Pain Assessment No/denies pain   Evaluation and Treatment Procedures Explained to Patient/Family Yes   Evaluation and Treatment Procedures agreed to   Wound Properties  Date First Assessed: 01/05/16 Time First Assessed: 1435 Wound Type: Other (Comment) Location: Heel Location Orientation: Left Present on Admission: Yes   Dressing Type Gauze (Comment)  medihoney, 2x2, medipore tape and gauze   Dressing Changed Changed   Dressing Status Old drainage   Dressing Change Frequency PRN   Site / Wound Assessment Friable;Yellow   % Wound base Red or Granulating 20%  was 100%   % Wound base Yellow 80%   % Wound base Black 0%   % Wound base Other (Comment) 0%  biofilm   Wound Length (cm) 1 cm  was 1.2 with undermining of .5 cm    Wound Width (cm) 0.7 cm  was .9   Wound Depth (cm) 0.4 cm  was at least 1.5    Margins Epibole (rolled edges)   Closure None   Drainage Amount Minimal   Drainage Description Serosanguineous   Treatment Cleansed;Debridement (Selective)   Wound Properties Date First Assessed: 11/10/15 Time First Assessed: 1455 Wound Type: Other (Comment) Location: Foot Location Orientation: Left Wound Description (Comments): lateral aspect of transmetatarsal ampuation    Dressing Type Gauze (Comment)  honey   Dressing Changed Changed   Dressing Status Old drainage   Dressing Change Frequency Every 3 days   Site / Wound Assessment Dry;Red;Pink;Yellow   % Wound base Red or Granulating 95%   % Wound base Yellow 5%   % Wound base Other (Comment) --  biofilm   Wound Length (cm) 1 cm   Wound Width (cm) 0.8 cm   Undermining (cm) throuhout wound    Margins Unattached edges (unapproximated)   Drainage Amount Scant   Drainage Description Serous   Treatment Cleansed;Debridement (Selective)   Selective Debridement - Location edges and wound bed    Selective Debridement - Tools Used Forceps;Scalpel   Selective Debridement - Tissue Removed biofilm off of metatarsal and heel wound; slough from heel and edipole edges   Wound Therapy - Clinical Statement Pt has been coming to treatment inconsisently due to transportation issues.  Pt forefoot wound continues  to heal slowly.  Heel wound continues to undermine throughout alll edges with calloused edges.  Debridement of some of the callous makes the wound appear to have gotten bigger but we are just removing the dead tissue from the rooftop of the woundl    Wound Therapy - Functional Problem List increased pain with increased walking .   Factors Delaying/Impairing Wound Healing Altered sensation;Diabetes Mellitus;Multiple medical problems;Polypharmacy;Vascular compromise   Hydrotherapy Plan Debridement;Dressing change   Wound Therapy - Current Recommendations PT   Wound Plan Continue appropriate wound care to Bil wounds,    Dressing  honey to forefoot, silver to heel.  Both covered with 2x2 and kling.                    PT Short Term Goals -  04/16/16 1616    PT SHORT TERM GOAL #1   Title Pt necrotic tissue to be 25%   Time 2   Period Weeks   Status Achieved   PT SHORT TERM GOAL #2   Title Pt  granulated tissue to be 75%   Time 2   Period Days   Status Achieved   PT SHORT TERM GOAL #3   Title Pt to verbalize the importance of keeping her leg in a dependent postion   Time 2   Period Weeks   Status Achieved   PT SHORT TERM GOAL #4   Title Pt pain to be no greater than a 4/10   Time 2   Period Weeks   Status Achieved           PT Long Term Goals - 04/16/16 1622    PT LONG TERM GOAL #1   Title Pt necrotic tissue to be 0%   Time 4   Period Weeks   Status Achieved  achieved for initial wound; ongoing heel   PT LONG TERM GOAL #2   Title Pt wound to be granulated 100%    Time 4   Period Weeks   Status Achieved   PT LONG TERM GOAL #3   Title Pt wound size to be decreased by 1x .25x.2 cm    Baseline 2.2x .7x .3   Time 4   Period Weeks   Status Partially Met   PT LONG TERM GOAL #4   Title ADDed: 3/32/2017:  Began treatment on heel:  Heel to be 100% granulated   Time 6   Period Weeks   Status On-going   PT LONG TERM GOAL #5   Title Heel wound to be .5x.5 with a  depth no greater than .5   Baseline wound was 100% grey necrotic tissue opening of 1.2x .9 with a depth of at least 1.5 and undermining throughout wound of .5 cm    Time 6   Period Weeks   Status On-going               Plan - 04/16/16 1616    Rehab Potential Good      Patient will benefit from skilled therapeutic intervention in order to improve the following deficits and impairments:  Abnormal gait, Other (comment)  Visit Diagnosis: Pain in left foot  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 Ankle fracture, bimalleolar, closed 12/29/2012  . Anemia of unknown etiology 11/06/2012  . Atherosclerotic PVD with ulceration ([place]) 11/04/2012  . Type II or unspecified type diabetes mellitus with peripheral circulatory disorders, uncontrolled(250.72) 11/04/2012  . Tobacco use disorder 11/04/2012  . Open wound of left foot 11/04/2012  . Atherosclerosis of native arteries of the extremities with ulceration(440.23) 07/09/2012  . Weight loss, unintentional 09/18/2011  . Anorexia 09/18/2011  . Early satiety 09/18/2011  . 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/07/2011   [name], PT CLT [phone] 04/16/2016, 4:30 PM  [facility] [address] [place], [place], [address] Phone: [phone]   Fax:  [phone]  Name: KECHIA YAHNKE MRN: [id number] Date of Birth: August 20, 1946

## 2016-04-19 ENCOUNTER — Telehealth (HOSPITAL_COMMUNITY): Payer: Self-pay | Admitting: Physical Therapy

## 2016-04-19 ENCOUNTER — Ambulatory Visit (HOSPITAL_COMMUNITY): Payer: Medicare HMO | Admitting: Physical Therapy

## 2016-04-19 NOTE — Telephone Encounter (Signed)
Called pt re missed appointment.  Pt states that she called and rescheduled appointment due to not having a ride.  [name], [place] CLT [phone]

## 2016-04-24 ENCOUNTER — Ambulatory Visit (HOSPITAL_COMMUNITY): Payer: Medicare HMO | Admitting: Physical Therapy

## 2016-04-27 ENCOUNTER — Ambulatory Visit (HOSPITAL_COMMUNITY): Payer: Medicare HMO

## 2016-04-30 ENCOUNTER — Telehealth (HOSPITAL_COMMUNITY): Payer: Self-pay

## 2016-04-30 ENCOUNTER — Ambulatory Visit (HOSPITAL_COMMUNITY): Payer: Medicare HMO | Admitting: Physical Therapy

## 2016-04-30 DIAGNOSIS — I70244 Atherosclerosis of native arteries of left leg with ulceration of heel and midfoot: Secondary | ICD-10-CM | POA: Diagnosis not present

## 2016-04-30 DIAGNOSIS — R2681 Unsteadiness on feet: Secondary | ICD-10-CM

## 2016-04-30 DIAGNOSIS — M79672 Pain in left foot: Secondary | ICD-10-CM | POA: Diagnosis not present

## 2016-04-30 DIAGNOSIS — I70334 Atherosclerosis of unspecified type of bypass graft(s) of the right leg with ulceration of heel and midfoot: Secondary | ICD-10-CM | POA: Diagnosis not present

## 2016-04-30 NOTE — Therapy (Addendum)
[place], [place], [address] Phone: [phone]   Fax:  [phone]  Wound Care Therapy  Patient Details  Name: Sherri Brooks MRN: [id number] Date of Birth: 03-04-1946 No Data Recorded  Encounter Date: 04/30/2016      PT End of Session - 04/30/16 1818    Visit Number 22   Number of Visits 265   Date for PT Re-Evaluation 04/17/16   Authorization Type [employer] medicare   Authorization Time Period Gcodes updated visit 18   Authorization - Visit Number 22   Authorization - Number of Visits 28   PT Start Time 1604   PT Stop Time 1635   PT Time Calculation (min) 31 min   Activity Tolerance Patient tolerated treatment well   Behavior During Therapy [facility] for tasks assessed/performed      Past Medical History  Diagnosis Date  . Diabetes mellitus   . Hypertension   . Hyperlipidemia   . PVD (peripheral vascular disease) ([place])   . Cellulitis of buttock, left 2010  . Arthritis   . GERD (gastroesophageal reflux disease)   . CAD (coronary artery disease)   . MI (myocardial infarction) ([place])   . Wears glasses     Past Surgical History  Procedure Laterality Date  . Femoral-popliteal bypass graft  07/2010    pt has had 3 fem-pop bpg  . Right common iliac stent  10/2009    Dr Doren Custard ([place])  . Tonsillectomy    . S/p hysterecotmy      partial  . Foot amputation  2010    left  . Colonoscopy  10/26/2011    Procedure: COLONOSCOPY;  Surgeon: Dorothyann Peng, MD;  [facility];  Service: Endoscopy;  Laterality: N/A;  1:30  . Femoral-popliteal bypass graft  11/04/2012    Procedure: BYPASS GRAFT FEMORAL-POPLITEAL ARTERY;  Surgeon: Angelia Mould, MD;  Location: [facility] OR;  Service: Vascular;  Laterality: Left;  Redo Left Femoral Popliteal Bypass with PTFE  . Orif ankle fracture Left 01/27/2013    Procedure: OPEN REDUCTION INTERNAL FIXATION (ORIF) ANKLE FRACTURE;  Surgeon: Wylene Simmer, MD;  Location: [place];  Service: Orthopedics;  Laterality: Left;  . Abdominal aortagram N/A 07/28/2012    Procedure: ABDOMINAL Maxcine Ham;  Surgeon: Angelia Mould, MD;  Location: [facility] CATH LAB;  Service: Cardiovascular;  Laterality: N/A;  . Lower extremity angiogram Bilateral 07/28/2012    Procedure: LOWER EXTREMITY ANGIOGRAM;  Surgeon: Angelia Mould, MD;  Location: [facility] CATH LAB;  Service: Cardiovascular;  Laterality: Bilateral;  . Percutaneous stent intervention Left 07/28/2012    Procedure: PERCUTANEOUS STENT INTERVENTION;  Surgeon: Angelia Mould, MD;  Location: [facility] CATH LAB;  Service: Cardiovascular;  Laterality: Left;  lt ext tiliac stentx1  . Peripheral vascular catheterization N/A 01/02/2016    Procedure: Abdominal Aortogram;  Surgeon: Angelia Mould, MD;  Location: [place] CV LAB;  Service: Cardiovascular;  Laterality: N/A;  . Lower extremity angiogram Left 01/02/2016    Procedure: Lower Extremity Angiogram;  Surgeon: Angelia Mould, MD;  Location: [place] CV LAB;  Service: Cardiovascular;  Laterality: Left;  . Peripheral vascular catheterization Left 01/02/2016    Procedure: Peripheral Vascular Intervention;  Surgeon: Angelia Mould, MD;  Location: [place] CV LAB;  Service: Cardiovascular;  Laterality: Left;  lt ext iliac    There were no vitals filed for this visit.  Wound Therapy - 04/30/16 1809    Subjective pt states she has a hard time getting here due to depending on others for transportation.  Pt was given [facility] info but unsure why she has not arranged assistance.    Patient and Family Stated Goals wound to heal    Date of Onset 11/05/15   Prior Treatments self care   Pain Assessment No/denies pain   Wound Properties Date First Assessed: 01/05/16 Time First Assessed: 1435 Wound Type: Other (Comment) Location: Heel Location Orientation: Left Present on Admission: Yes   Dressing Type Gauze (Comment)   medihoney, 2x2, medipore tape and gauze   Dressing Changed Changed   Dressing Status Old drainage   Dressing Change Frequency PRN   Site / Wound Assessment Friable;Yellow   % Wound base Red or Granulating 60%  was 20%   % Wound base Yellow 40%  was 80%   % Wound base Black 0%   % Wound base Other (Comment) 0%  biofilm   Wound Length (cm) 0.8 cm  was 1cm   Wound Width (cm) 0.6 cm  was 0.7cm   Wound Depth (cm) 0.2 cm  was 0.4cm   Margins Epibole (rolled edges)   Closure None   Drainage Amount Minimal   Drainage Description Serosanguineous   Treatment Cleansed;Debridement (Selective)   Wound Properties Date First Assessed: 11/10/15 Time First Assessed: 1455 Wound Type: Other (Comment) Location: Foot Location Orientation: Left Wound Description (Comments): lateral aspect of transmetatarsal ampuation    Dressing Type Gauze (Comment)  honey   Dressing Changed Changed   Dressing Status Old drainage   Dressing Change Frequency Every 3 days   Site / Wound Assessment Dry;Red;Pink;Yellow   % Wound base Red or Granulating 95%   % Wound base Yellow 5%   % Wound base Other (Comment) --  biofilm   Wound Length (cm) 1 cm  was 1 cm   Wound Width (cm) 0.6 cm  was 0.8 cm   Wound Depth (cm) 0.1 cm  was 0.1cm   Margins Unattached edges (unapproximated)   Drainage Amount Scant   Drainage Description Serous   Treatment Cleansed;Debridement (Selective)   Selective Debridement - Location edges and wound bed    Selective Debridement - Tools Used Forceps;Scalpel   Selective Debridement - Tissue Removed biofilm off of metatarsal and heel wound; slough from heel and edipole edges   Wound Therapy - Clinical Statement Pt has not been coming to treatment consisently due to transportation issues.  Wounds are improving slowly, however appear to be closing in with less epibole and no longer with undermining.  Pt continues to wear cast shoe and keeps bandages in place and wound bed clean.   Wound  Therapy - Functional Problem List increased pain with increased walking .   Factors Delaying/Impairing Wound Healing Altered sensation;Diabetes Mellitus;Multiple medical problems;Polypharmacy;Vascular compromise   Hydrotherapy Plan Debridement;Dressing change   Wound Therapy - Current Recommendations PT   Wound Plan Continue appropriate wound care to Bil wounds,    Dressing  silver acticoat, 2x2 and kling.                    PT Short Term Goals - 04/16/16 1616    PT SHORT TERM GOAL #1   Title Pt necrotic tissue to be 25%   Time 2   Period Weeks   Status Achieved   PT SHORT TERM GOAL #2   Title Pt  granulated tissue to be 75%   Time 2  Period Days   Status Achieved   PT SHORT TERM GOAL #3   Title Pt to verbalize the importance of keeping her leg in a dependent postion   Time 2   Period Weeks   Status Achieved   PT SHORT TERM GOAL #4   Title Pt pain to be no greater than a 4/10   Time 2   Period Weeks   Status Achieved           PT Long Term Goals - 04/16/16 1622    PT LONG TERM GOAL #1   Title Pt necrotic tissue to be 0%   Time 4   Period Weeks   Status Achieved  achieved for initial wound; ongoing heel   PT LONG TERM GOAL #2   Title Pt wound to be granulated 100%    Time 4   Period Weeks   Status Achieved   PT LONG TERM GOAL #3   Title Pt wound size to be decreased by 1x .25x.2 cm    Baseline 2.2x .7x .3   Time 4   Period Weeks   Status Partially Met   PT LONG TERM GOAL #4   Title ADDed: 3/32/2017:  Began treatment on heel:  Heel to be 100% granulated   Time 6   Period Weeks   Status On-going   PT LONG TERM GOAL #5   Title Heel wound to be .5x.5 with a depth no greater than .5   Baseline wound was 100% grey necrotic tissue opening of 1.2x .9 with a depth of at least 1.5 and undermining throughout wound of .5 cm    Time 6   Period Weeks   Status On-going             Patient will benefit from skilled therapeutic intervention in  order to improve the following deficits and impairments:     Visit Diagnosis: Pain in left foot  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 Ankle fracture, bimalleolar, closed 12/29/2012  . Anemia of unknown etiology 11/06/2012  . Atherosclerotic PVD with ulceration ([place]) 11/04/2012  . Type II or unspecified type diabetes mellitus with peripheral circulatory disorders, uncontrolled(250.72) 11/04/2012  . Tobacco use disorder 11/04/2012  . Open wound of left foot 11/04/2012  . Atherosclerosis of native arteries of the extremities with ulceration(440.23) 07/09/2012  . Weight loss, unintentional 09/18/2011  . Anorexia 09/18/2011  . Early satiety 09/18/2011  . 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/07/2011    Teena Irani, PTA/CLT [place], PT CLT [phone] 04/30/2016, 6:20 PM  [facility] [address] [place], [place], [address] Phone: [phone]   Fax:  [phone]  Name: Sherri Brooks MRN: [id number] Date of Birth: 1946-09-02

## 2016-04-30 NOTE — Telephone Encounter (Signed)
04/30/16 her daughter called to say there was a mixup in appts.... Her mom hasn't been staying with her and another family member didn't know about the appts.

## 2016-05-02 NOTE — Addendum Note (Signed)
Addended by: Leeroy Cha on: 05/02/2016 10:03 AM   Modules accepted: Orders

## 2016-05-03 ENCOUNTER — Ambulatory Visit (HOSPITAL_COMMUNITY): Payer: Medicare HMO | Admitting: Physical Therapy

## 2016-05-09 ENCOUNTER — Ambulatory Visit (HOSPITAL_COMMUNITY): Payer: Medicare HMO

## 2016-05-09 DIAGNOSIS — I70244 Atherosclerosis of native arteries of left leg with ulceration of heel and midfoot: Secondary | ICD-10-CM

## 2016-05-09 DIAGNOSIS — R2681 Unsteadiness on feet: Secondary | ICD-10-CM

## 2016-05-09 DIAGNOSIS — M79672 Pain in left foot: Secondary | ICD-10-CM | POA: Diagnosis not present

## 2016-05-09 DIAGNOSIS — I70334 Atherosclerosis of unspecified type of bypass graft(s) of the right leg with ulceration of heel and midfoot: Secondary | ICD-10-CM | POA: Diagnosis not present

## 2016-05-09 NOTE — Therapy (Addendum)
[place], [place], [address] Phone: [phone]   Fax:  [phone]  Wound Care Therapy:  Reassessment  Patient Details  Name: Sherri Brooks MRN: [id number] Date of Birth: [birth year] No Data Recorded  Encounter Date: 05/09/2016      PT End of Session - 05/09/16 1549    Visit Number 23   Number of Visits 26   Date for PT Re-Evaluation 06/06/16   Authorization Type [employer] medicare   Authorization Time Period Gcodes updated visit 18   Authorization - Visit Number 23   Authorization - Number of Visits 28   PT Start Time 4008   PT Stop Time 1430   PT Time Calculation (min) 38 min   Activity Tolerance Patient tolerated treatment well   Behavior During Therapy [facility] for tasks assessed/performed      Past Medical History:  Diagnosis Date  . Arthritis   . CAD (coronary artery disease)   . Cellulitis of buttock, left 2010  . Diabetes mellitus   . GERD (gastroesophageal reflux disease)   . Hyperlipidemia   . Hypertension   . MI (myocardial infarction) ([place])   . PVD (peripheral vascular disease) ([place])   . Wears glasses     Past Surgical History:  Procedure Laterality Date  . ABDOMINAL AORTAGRAM N/A 07/28/2012   Procedure: ABDOMINAL Maxcine Ham;  Surgeon: Angelia Mould, MD;  Location: [facility] CATH LAB;  Service: Cardiovascular;  Laterality: N/A;  . COLONOSCOPY  10/26/2011   Procedure: COLONOSCOPY;  Surgeon: Dorothyann Peng, MD;  [facility];  Service: Endoscopy;  Laterality: N/A;  1:30  . FEMORAL-POPLITEAL BYPASS GRAFT  07/2010   pt has had 3 fem-pop bpg  . FEMORAL-POPLITEAL BYPASS GRAFT  11/04/2012   Procedure: BYPASS GRAFT FEMORAL-POPLITEAL ARTERY;  Surgeon: Angelia Mould, MD;  Location: [facility] OR;  Service: Vascular;  Laterality: Left;  Redo Left Femoral Popliteal Bypass with PTFE  . FOOT AMPUTATION  2010   left  . LOWER EXTREMITY ANGIOGRAM Bilateral 07/28/2012   Procedure: LOWER EXTREMITY  ANGIOGRAM;  Surgeon: Angelia Mould, MD;  Location: [facility] CATH LAB;  Service: Cardiovascular;  Laterality: Bilateral;  . LOWER EXTREMITY ANGIOGRAM Left 01/02/2016   Procedure: Lower Extremity Angiogram;  Surgeon: Angelia Mould, MD;  Location: [place] CV LAB;  Service: Cardiovascular;  Laterality: Left;  . ORIF ANKLE FRACTURE Left 01/27/2013   Procedure: OPEN REDUCTION INTERNAL FIXATION (ORIF) ANKLE FRACTURE;  Surgeon: Wylene Simmer, MD;  Location: [place];  Service: Orthopedics;  Laterality: Left;  . PERCUTANEOUS STENT INTERVENTION Left 07/28/2012   Procedure: PERCUTANEOUS STENT INTERVENTION;  Surgeon: Angelia Mould, MD;  Location: [facility] CATH LAB;  Service: Cardiovascular;  Laterality: Left;  lt ext tiliac stentx1  . PERIPHERAL VASCULAR CATHETERIZATION N/A 01/02/2016   Procedure: Abdominal Aortogram;  Surgeon: Angelia Mould, MD;  Location: [place] CV LAB;  Service: Cardiovascular;  Laterality: N/A;  . PERIPHERAL VASCULAR CATHETERIZATION Left 01/02/2016   Procedure: Peripheral Vascular Intervention;  Surgeon: Angelia Mould, MD;  Location: [place] CV LAB;  Service: Cardiovascular;  Laterality: Left;  lt ext iliac  . right common iliac stent  10/2009   Dr Doren Custard ([place])  . S/P Hysterecotmy     partial  . TONSILLECTOMY      There were no vitals filed for this visit.       Subjective Assessment - 05/09/16 1513    Subjective Pt arrived with dressings intact, no reports of  pain this session.     Currently in Pain? No/denies                   Wound Therapy - 05/09/16 1542    Subjective Pt arrived with dressings intact, no reports of pain this session.     Patient and Family Stated Goals wound to heal    Date of Onset 11/05/15   Prior Treatments self care   Pain Assessment No/denies pain   Pain Score 0-No pain   Evaluation and Treatment Procedures Explained to Patient/Family Yes   Evaluation and Treatment Procedures agreed to    Wound Properties Date First Assessed: 01/05/16 Time First Assessed: 1435 Wound Type: Other (Comment) Location: Heel Location Orientation: Left Present on Admission: Yes   Dressing Type Gauze (Comment)  medihoney and gauze   Dressing Changed Changed   Dressing Status Old drainage   Dressing Change Frequency PRN   Site / Wound Assessment Friable;Yellow;Pale   % Wound base Red or Granulating 60% was 0%   % Wound base Yellow 40%   % Wound base Black 0%   Wound Length (cm) 0.8 cm was 1.2 cm with undermining of .5 cm throughout   Wound Width (cm) 0.5 cm was .9 cm    Wound Depth (cm) 0.2 cm was  At least 1.5 cm    Margins Epibole (rolled edges)   Closure None   Drainage Amount Minimal   Drainage Description Serosanguineous   Treatment Cleansed;Debridement (Selective)   Wound Properties Date First Assessed: 11/10/15 Time First Assessed: 1455 Wound Type: Other (Comment) Location: Foot Location Orientation: Left Wound Description (Comments): lateral aspect of transmetatarsal ampuation    Dressing Type --  medihoney and gauze   Dressing Changed Changed   Dressing Status Old drainage   Dressing Change Frequency Every 3 days   Site / Wound Assessment Dry;Red;Pink;Yellow   % Wound base Red or Granulating 95%   % Wound base Yellow 0%   % Wound base Other (Comment) 5%  Biofilm   Wound Length (cm) 1 cm was 1.3 on 5/18   Wound Width (cm) 0.6 cm was 1.3 on 5/18   Wound Depth (cm) 0.1 cm was .1    Margins Unattached edges (unapproximated)   Drainage Amount Scant   Drainage Description Serous   Treatment Cleansed;Debridement (Selective)   Selective Debridement - Location edges and wound bed    Selective Debridement - Tools Used Forceps;Scalpel   Selective Debridement - Tissue Removed biofilm off of metatarsal and heel wound; slough from heel and edipole edges   Wound Therapy - Clinical Statement Pt reports she has stopped smoking for a month now.  Noted increased redness with bleeding today.   Continued selective debridement about epibole edges to promote healing around heel.  Noted dryness on dorsal lateral aspect wound, changed dressing to medihoney per PT.  Measurements taken for reassessment.  Continues with gauze and netting dressings.  No reports of pain through session.     Wound Therapy - Functional Problem List increased pain with increased walking .   Factors Delaying/Impairing Wound Healing Altered sensation;Diabetes Mellitus;Multiple medical problems;Polypharmacy;Vascular compromise   Hydrotherapy Plan Debridement;Dressing change   Wound Therapy - Current Recommendations PT   Wound Plan Continue appropriate wound care to Bil wounds,    Dressing  medihoney Bil wounds with 2x2, gauze and netting                   PT Short Term Goals - 04/16/16 1616  PT SHORT TERM GOAL #1   Title Pt necrotic tissue to be 25%   Time 2   Period Weeks   Status Achieved     PT SHORT TERM GOAL #2   Title Pt  granulated tissue to be 75%   Time 2   Period Days   Status Achieved     PT SHORT TERM GOAL #3   Title Pt to verbalize the importance of keeping her leg in a dependent postion   Time 2   Period Weeks   Status Achieved     PT SHORT TERM GOAL #4   Title Pt pain to be no greater than a 4/10   Time 2   Period Weeks   Status Achieved           PT Long Term Goals - 04/16/16 1622      PT LONG TERM GOAL #1   Title Pt necrotic tissue to be 0%   Time 4   Period Weeks   Status Achieved  achieved for initial wound; ongoing heel     PT LONG TERM GOAL #2   Title Pt wound to be granulated 100%    Time 4   Period Weeks   Status Achieved     PT LONG TERM GOAL #3   Title Pt wound size to be decreased by 1x .25x.2 cm    Baseline 2.2x .7x .3   Time 4   Period Weeks   Status Partially Met     PT LONG TERM GOAL #4   Title ADDed: 3/32/2017:  Began treatment on heel:  Heel to be 100% granulated   Time 6   Period Weeks   Status On-going     PT LONG TERM  GOAL #5   Title Heel wound to be .5x.5 with a depth no greater than .5   Baseline wound was 100% grey necrotic tissue opening of 1.2x .9 with a depth of at least 1.5 and undermining throughout wound of .5 cm    Time 6   Period Weeks   Status On-going             Patient will benefit from skilled therapeutic intervention in order to improve the following deficits and impairments:     Visit Diagnosis: Pain in left foot  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 Ankle fracture, bimalleolar, closed 12/29/2012  . Anemia of unknown etiology 11/06/2012  . Atherosclerotic PVD with ulceration ([place]) 11/04/2012  . Type II or unspecified type diabetes mellitus with peripheral circulatory disorders, uncontrolled(250.72) 11/04/2012  . Tobacco use disorder 11/04/2012  . Open wound of left foot 11/04/2012  . Atherosclerosis of native arteries of the extremities with ulceration(440.23) 07/09/2012  . Weight loss, unintentional 09/18/2011  . Anorexia 09/18/2011  . Early satiety 09/18/2011  . 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/07/2011   Ihor Austin, LPTA; [place]  [name], PT CLT [phone] 05/09/2016, 3:52 PM  [place] [address] [place], [place], [address] Phone: [phone]   Fax:  [phone]  Name: Sherri Brooks MRN: [id number] Date of Birth: [birth year]

## 2016-05-11 ENCOUNTER — Emergency Department (HOSPITAL_COMMUNITY): Payer: Medicare HMO

## 2016-05-11 ENCOUNTER — Emergency Department (HOSPITAL_COMMUNITY)
Admission: EM | Admit: 2016-05-11 | Discharge: 2016-05-11 | Disposition: A | Discharge status: Home / Self Care | Payer: Medicare HMO | Attending: Emergency Medicine | Admitting: Emergency Medicine

## 2016-05-11 ENCOUNTER — Telehealth (HOSPITAL_COMMUNITY): Payer: Self-pay

## 2016-05-11 ENCOUNTER — Ambulatory Visit (HOSPITAL_COMMUNITY): Payer: Medicare HMO

## 2016-05-11 ENCOUNTER — Encounter (HOSPITAL_COMMUNITY): Payer: Self-pay | Admitting: *Deleted

## 2016-05-11 DIAGNOSIS — Z794 Long term (current) use of insulin: Secondary | ICD-10-CM | POA: Diagnosis not present

## 2016-05-11 DIAGNOSIS — H7292 Unspecified perforation of tympanic membrane, left ear: Secondary | ICD-10-CM | POA: Diagnosis not present

## 2016-05-11 DIAGNOSIS — I252 Old myocardial infarction: Secondary | ICD-10-CM | POA: Insufficient documentation

## 2016-05-11 DIAGNOSIS — E785 Hyperlipidemia, unspecified: Secondary | ICD-10-CM | POA: Insufficient documentation

## 2016-05-11 DIAGNOSIS — Z7982 Long term (current) use of aspirin: Secondary | ICD-10-CM | POA: Diagnosis not present

## 2016-05-11 DIAGNOSIS — Z79899 Other long term (current) drug therapy: Secondary | ICD-10-CM | POA: Diagnosis not present

## 2016-05-11 DIAGNOSIS — I10 Essential (primary) hypertension: Secondary | ICD-10-CM | POA: Diagnosis not present

## 2016-05-11 DIAGNOSIS — Z87891 Personal history of nicotine dependence: Secondary | ICD-10-CM | POA: Diagnosis not present

## 2016-05-11 DIAGNOSIS — I2510 Atherosclerotic heart disease of native coronary artery without angina pectoris: Secondary | ICD-10-CM | POA: Diagnosis not present

## 2016-05-11 DIAGNOSIS — H6692 Otitis media, unspecified, left ear: Secondary | ICD-10-CM | POA: Insufficient documentation

## 2016-05-11 DIAGNOSIS — R42 Dizziness and giddiness: Secondary | ICD-10-CM | POA: Diagnosis not present

## 2016-05-11 LAB — I-STAT TROPONIN, ED: TROPONIN I, POC: 0.02 ng/mL (ref 0.00–0.08)

## 2016-05-11 LAB — I-STAT CHEM 8, ED
BUN: 24 mg/dL — AB (ref 6–20)
CALCIUM ION: 1.2 mmol/L (ref 1.12–1.23)
CHLORIDE: 107 mmol/L (ref 101–111)
Creatinine, Ser: 1.2 mg/dL — ABNORMAL HIGH (ref 0.44–1.00)
Glucose, Bld: 99 mg/dL (ref 65–99)
HEMATOCRIT: 38 % (ref 36.0–46.0)
Hemoglobin: 12.9 g/dL (ref 12.0–15.0)
POTASSIUM: 3.9 mmol/L (ref 3.5–5.1)
SODIUM: 142 mmol/L (ref 135–145)
TCO2: 26 mmol/L (ref 0–100)

## 2016-05-11 MED ORDER — LABETALOL HCL 200 MG PO TABS
200.0000 mg | ORAL_TABLET | Freq: Once | ORAL | Status: AC
  Administered 2016-05-11: 200 mg via ORAL
  Filled 2016-05-11: qty 1

## 2016-05-11 MED ORDER — AMOXICILLIN-POT CLAVULANATE 875-125 MG PO TABS: 1.0000 | ORAL_TABLET | Freq: Two times a day (BID) | ORAL | 0 refills | Status: DC

## 2016-05-11 MED ORDER — HYDRALAZINE HCL 20 MG/ML IJ SOLN: 10.0000 mg | Freq: Once | INTRAMUSCULAR | Status: DC

## 2016-05-11 MED ORDER — OFLOXACIN 0.3 % OT SOLN: 10.0000 [drp] | Freq: Two times a day (BID) | OTIC | 0 refills | Status: DC

## 2016-05-11 NOTE — ED Notes (Signed)
EDP in to reeval and discuss discharge

## 2016-05-11 NOTE — ED Triage Notes (Signed)
Pt states began having pain accompanied by drainage in both of her ears 1 week ago. Starting 2 days ago, pt began having dizziness with "flashes of light" in her right eye. Pt states she has chronic problem with her left side due to a vascular problem but this problem is worse now. Pt is alert and oriented.

## 2016-05-11 NOTE — ED Notes (Signed)
Patient stated that she has a "short period of dizziness" upon sitting and standing

## 2016-05-11 NOTE — Discharge Instructions (Signed)
Take the prescriptions as directed.  Call your regular medical doctor and the ENT doctor tomorrow to schedule a follow up appointment within the next 2 days.  Return to the Emergency Department immediately sooner if worsening.

## 2016-05-11 NOTE — Telephone Encounter (Signed)
No show, called and spoke to family member who stated they were at ER for ear infection.  Family member reminded next apt date and time.   [address] [place]; [facility] [phone]

## 2016-05-11 NOTE — ED Notes (Signed)
EDP at bedside at this time.  

## 2016-05-11 NOTE — ED Provider Notes (Signed)
[place] DEPT Provider Note   CSN: [id number] Arrival date & time: 05/11/16  1251  First Provider Contact:  None       History   Chief Complaint Chief Complaint  Patient presents with  . Dizziness    Sherri Brooks is a 70 y.o. female.  Sherri  Pt was seen at 1425. Per pt, c/o gradual onset and persistence of constant left ear "drainage" that began 2 days ago. Pt states the drainage was "watery" with "a little blood." Pt states she also began to experience a "spinning" sensation 2 days ago. Pt states the spinning sensation occurs "in flashes" and when she moves from laying to "sitting up quick." Denies headache, no visual changes, no focal motor weakness, no tingling/numbness in extremities, no ataxia, no slurred speech, no facial droop, no CP/palpitations, no SOB/cough, no abd pain, no N/V/D, no fevers, no rash, no injury.     Past Medical History:  Diagnosis Date  . Arthritis   . CAD (coronary artery disease)   . Cellulitis of buttock, left 2010  . Diabetes mellitus   . GERD (gastroesophageal reflux disease)   . Hyperlipidemia   . Hypertension   . MI (myocardial infarction) ([place])   . PVD (peripheral vascular disease) ([place])   . Wears glasses     Patient Active Problem List   Diagnosis Date Noted  . Ankle fracture, bimalleolar, closed 12/29/2012  . Anemia of unknown etiology 11/06/2012  . Atherosclerotic PVD with ulceration ([place]) 11/04/2012  . Type II or unspecified type diabetes mellitus with peripheral circulatory disorders, uncontrolled(250.72) 11/04/2012  . Tobacco use disorder 11/04/2012  . Open wound of left foot 11/04/2012  . Atherosclerosis of native arteries of the extremities with ulceration(440.23) 07/09/2012  . Weight loss, unintentional 09/18/2011  . Anorexia 09/18/2011  . Early satiety 09/18/2011  . 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/07/2011    Past Surgical History:  Procedure Laterality Date  . ABDOMINAL AORTAGRAM N/A 07/28/2012   Procedure:  ABDOMINAL Maxcine Ham;  Surgeon: Angelia Mould, MD;  Location: [facility] CATH LAB;  Service: Cardiovascular;  Laterality: N/A;  . COLONOSCOPY  10/26/2011   Procedure: COLONOSCOPY;  Surgeon: Dorothyann Peng, MD;  [facility];  Service: Endoscopy;  Laterality: N/A;  1:30  . FEMORAL-POPLITEAL BYPASS GRAFT  07/2010   pt has had 3 fem-pop bpg  . FEMORAL-POPLITEAL BYPASS GRAFT  11/04/2012   Procedure: BYPASS GRAFT FEMORAL-POPLITEAL ARTERY;  Surgeon: Angelia Mould, MD;  Location: [facility] OR;  Service: Vascular;  Laterality: Left;  Redo Left Femoral Popliteal Bypass with PTFE  . FOOT AMPUTATION  2010   left  . LOWER EXTREMITY ANGIOGRAM Bilateral 07/28/2012   Procedure: LOWER EXTREMITY ANGIOGRAM;  Surgeon: Angelia Mould, MD;  Location: [facility] CATH LAB;  Service: Cardiovascular;  Laterality: Bilateral;  . LOWER EXTREMITY ANGIOGRAM Left 01/02/2016   Procedure: Lower Extremity Angiogram;  Surgeon: Angelia Mould, MD;  Location: [place] CV LAB;  Service: Cardiovascular;  Laterality: Left;  . ORIF ANKLE FRACTURE Left 01/27/2013   Procedure: OPEN REDUCTION INTERNAL FIXATION (ORIF) ANKLE FRACTURE;  Surgeon: Wylene Simmer, MD;  Location: [place];  Service: Orthopedics;  Laterality: Left;  . PERCUTANEOUS STENT INTERVENTION Left 07/28/2012   Procedure: PERCUTANEOUS STENT INTERVENTION;  Surgeon: Angelia Mould, MD;  Location: [facility] CATH LAB;  Service: Cardiovascular;  Laterality: Left;  lt ext tiliac stentx1  . PERIPHERAL VASCULAR CATHETERIZATION N/A 01/02/2016   Procedure: Abdominal Aortogram;  Surgeon: Angelia Mould, MD;  Location: [facility]  INVASIVE CV LAB;  Service: Cardiovascular;  Laterality: N/A;  . PERIPHERAL VASCULAR CATHETERIZATION Left 01/02/2016   Procedure: Peripheral Vascular Intervention;  Surgeon: Angelia Mould, MD;  Location: [place] CV LAB;  Service: Cardiovascular;  Laterality: Left;  lt ext iliac  . right common iliac stent  10/2009   Dr  Doren Custard ([place])  . S/P Hysterecotmy     partial  . TONSILLECTOMY         Home Medications    Prior to Admission medications   Medication Sig Start Date End Date Taking? Authorizing Provider  albuterol (PROVENTIL HFA;VENTOLIN HFA) 108 (90 BASE) MCG/ACT inhaler Inhale 2 puffs into the lungs every 6 (six) hours as needed. For bronchitis and coughing    Historical Provider, MD  alendronate (FOSAMAX) 70 MG tablet Take 70 mg by mouth every 7 (seven) days. On Mondays 08/30/11   Historical Provider, MD  aspirin 325 MG EC tablet Take 325 mg by mouth daily.      Historical Provider, MD  clopidogrel (PLAVIX) 75 MG tablet Take 75 mg by mouth daily.      Historical Provider, MD  hydrochlorothiazide (HYDRODIURIL) 25 MG tablet Take 25 mg by mouth daily.    Historical Provider, MD  insulin glargine (LANTUS) 100 UNIT/ML injection Inject 10 Units into the skin at bedtime.    Historical Provider, MD  labetalol (NORMODYNE) 200 MG tablet Take 200 mg by mouth 2 (two) times daily.      Historical Provider, MD  LORazepam (ATIVAN) 0.5 MG tablet Take 0.5 mg by mouth at bedtime.     Historical Provider, MD  Multiple Vitamin (MULITIVITAMIN WITH MINERALS) TABS Take 1 tablet by mouth daily.    Historical Provider, MD  oxyCODONE-acetaminophen (PERCOCET/ROXICET) 5-325 MG tablet Take 1 tablet by mouth every 4 (four) hours as needed for severe pain. 12/23/15   Angelia Mould, MD  simvastatin (ZOCOR) 40 MG tablet Take 40 mg by mouth at bedtime.  08/30/11   Historical Provider, MD    Family History Family History  Problem Relation Age of Onset  . Brain cancer Father   . Cancer Father   . Diabetes Mother     many family members  . Alzheimer's disease Mother   . Heart disease      Social History Social History  Substance Use Topics  . Smoking status: Former Smoker    Packs/day: 0.40    Years: 20.00    Types: Cigarettes    Start date: 11/04/2012    Quit date: 03/11/2016  . Smokeless tobacco: Never Used  .  Alcohol use No     Allergies   Iohexol   Review of Systems Review of Systems ROS: Statement: All systems negative except as marked or noted in the Sherri; Constitutional: Negative for fever and chills. ; ; Eyes: Negative for eye pain, redness and discharge. ; ; ENMT: +left ear drainage. Negative for ear pain, hoarseness, nasal congestion, sinus pressure and sore throat. ; ; Cardiovascular: Negative for chest pain, palpitations, diaphoresis, dyspnea and peripheral edema. ; ; Respiratory: Negative for cough, wheezing and stridor. ; ; Gastrointestinal: Negative for nausea, vomiting, diarrhea, abdominal pain, blood in stool, hematemesis, jaundice and rectal bleeding. . ; ; Genitourinary: Negative for dysuria, flank pain and hematuria. ; ; Musculoskeletal: Negative for back pain and neck pain. Negative for swelling and trauma.; ; Skin: Negative for pruritus, rash, abrasions, blisters, bruising and skin lesion.; ; Neuro: +"spinning." Negative for headache, lightheadedness and neck stiffness. Negative for weakness, altered level  of consciousness, altered mental status, extremity weakness, paresthesias, involuntary movement, seizure and syncope.       Physical Exam Updated Vital Signs BP (!) 222/71 (BP Location: Left Arm)   Pulse 90   Temp 99.2 F (37.3 C) (Temporal)   Resp 17   Ht '5\' 3"'$  (1.6 m)   Wt 140 lb (63.5 kg)   SpO2 100%   BMI 24.80 kg/m    Patient Vitals for the past 24 hrs:  BP Temp Temp src Pulse Resp SpO2 Height Weight  05/11/16 1704 182/75 - - 71 16 100 % - -  05/11/16 1615 177/69 - - 69 18 99 % - -  05/11/16 1536 (!) 214/74 - - 72 18 100 % - -  05/11/16 1302 (!) 222/71 99.2 F (37.3 C) Temporal 90 17 100 % '5\' 3"'$  (1.6 m) 140 lb (63.5 kg)   15:35 Orthostatic Vital Signs VP  Orthostatic Lying   BP- Lying:  214/74  Pulse- Lying: 72      Orthostatic Sitting  BP- Sitting:  210/87  Pulse- Sitting: 78      Orthostatic Standing at 0 minutes  BP- Standing at 0 minutes: 195/74   Pulse- Standing at 0 minutes: 80      Physical Exam 1430: Physical examination:  Nursing notes reviewed; Vital signs and O2 SAT reviewed;  Constitutional: Well developed, Well nourished, Well hydrated, In no acute distress; Head:  Normocephalic, atraumatic; Eyes: EOMI, PERRL, No scleral icterus; ENMT: +left TM perforated, wax in canal, no lesions, no bleeding. Right visualized TM clear, wax in canal, no lesions. Mouth and pharynx normal, Mucous membranes moist; Neck: Supple, Full range of motion, No lymphadenopathy; Cardiovascular: Regular rate and rhythm, No gallop; Respiratory: Breath sounds clear & equal bilaterally, No wheezes.  Speaking full sentences with ease, Normal respiratory effort/excursion; Chest: Nontender, Movement normal; Abdomen: Soft, Nontender, Nondistended, Normal bowel sounds; Genitourinary: No CVA tenderness; Extremities: Pulses normal, No tenderness, No edema, No calf edema or asymmetry.; Neuro: AA&Ox3, Major CN grossly intact. Speech clear.  No facial droop.  No nystagmus. Grips equal. Strength 5/5 equal bilat UE's and LE's.  DTR 2/4 equal bilat UE's and LE's.  No gross sensory deficits.  Normal cerebellar testing bilat UE's (finger-nose) and LE's (heel-shin). .; Skin: Color normal, Warm, Dry.   ED Treatments / Results  Labs (all labs ordered are listed, but only abnormal results are displayed)   EKG  EKG Interpretation None       Radiology   Procedures Procedures (including critical care time)  Medications Ordered in ED Medications  labetalol (NORMODYNE) tablet 200 mg (200 mg Oral Given 05/11/16 1449)     Initial Impression / Assessment and Plan / ED Course  I have reviewed the triage vital signs and the nursing notes.  Pertinent labs & imaging results that were available during my care of the patient were reviewed by me and considered in my medical decision making (see chart for details).  MDM Reviewed: previous chart, vitals and nursing  note Reviewed previous: labs and ECG Interpretation: labs, ECG and MRI   ED ECG REPORT   Date: 05/11/2016  Rate: 72  Rhythm: normal sinus rhythm  QRS Axis: normal  Intervals: normal  ST/T Wave abnormalities: nonspecific ST/T changes  Conduction Disutrbances:none  Narrative Interpretation: artifact, baseline wander  Old EKG Reviewed: unchanged; no significant changes from previous EKG dated 03/03/16   Results for orders placed or performed during the hospital encounter of 05/11/16  I-stat Chem 8, ED  Result  Value Ref Range   Sodium 142 135 - 145 mmol/L   Potassium 3.9 3.5 - 5.1 mmol/L   Chloride 107 101 - 111 mmol/L   BUN 24 (H) 6 - 20 mg/dL   Creatinine, Ser 1.20 (H) 0.44 - 1.00 mg/dL   Glucose, Bld 99 65 - 99 mg/dL   Calcium, Ion 1.20 1.12 - 1.23 mmol/L   TCO2 26 0 - 100 mmol/L   Hemoglobin 12.9 12.0 - 15.0 g/dL   HCT 38.0 36.0 - 46.0 %  I-stat troponin, ED  Result Value Ref Range   Troponin i, poc 0.02 0.00 - 0.08 ng/mL   Comment 3           Mr Brain Wo Contrast (neuro Protocol) Result Date: 05/11/2016 CLINICAL DATA:  Headache. Drainage from both years beginning 1 week ago. Dizziness. Visual disturbance. EXAM: MRI HEAD WITHOUT CONTRAST TECHNIQUE: Multiplanar, multiecho pulse sequences of the brain and surrounding structures were obtained without intravenous contrast. COMPARISON:  None. FINDINGS: Diffusion imaging does not show any acute or subacute infarction. The brainstem and cerebellum are normal. Cerebral hemispheres show mild chronic appearing small vessel ischemic changes within the deep and subcortical white matter, fairly typical for age. No cortical or large vessel territory infarction. No mass lesion, hemorrhage, hydrocephalus or extra-axial collection. No pituitary mass. No inflammatory sinus disease. The patient does have a mastoid effusion on the left. Major vessels at the base of the brain show flow. IMPRESSION: No acute intracranial finding. Mild chronic  small-vessel disease of the hemispheric white matter, fairly typical for age. Left mastoid effusion. Electronically Signed   By: Nelson Chimes M.D.   On: 05/11/2016 15:29   1730:  Pt did not take her BP med this afternoon; pt's usual med given while in the ED with BP trending downward. Pt has ambulated with steady gait, easy resps, NAD. Neuro exam remains unchanged. Pt wants to go home now. Tx OM with perforation with augmentin and ofloxacin; f/u ENT. Dx and testing d/w pt and family.  Questions answered.  Verb understanding, agreeable to d/c home with outpt f/u.      Final Clinical Impressions(s) / ED Diagnoses   Final diagnoses:  None    New Prescriptions New Prescriptions   No medications on file     Francine Graven, DO 05/15/16 0006

## 2016-05-14 ENCOUNTER — Ambulatory Visit (HOSPITAL_COMMUNITY): Payer: Medicare HMO | Admitting: Physical Therapy

## 2016-05-14 ENCOUNTER — Telehealth (HOSPITAL_COMMUNITY): Payer: Self-pay

## 2016-05-16 ENCOUNTER — Ambulatory Visit (HOSPITAL_COMMUNITY): Payer: Medicare HMO | Attending: Family Medicine | Admitting: Physical Therapy

## 2016-05-16 DIAGNOSIS — R2681 Unsteadiness on feet: Secondary | ICD-10-CM | POA: Insufficient documentation

## 2016-05-16 DIAGNOSIS — I70244 Atherosclerosis of native arteries of left leg with ulceration of heel and midfoot: Secondary | ICD-10-CM | POA: Insufficient documentation

## 2016-05-16 DIAGNOSIS — I70334 Atherosclerosis of unspecified type of bypass graft(s) of the right leg with ulceration of heel and midfoot: Secondary | ICD-10-CM | POA: Diagnosis not present

## 2016-05-16 DIAGNOSIS — M79672 Pain in left foot: Secondary | ICD-10-CM

## 2016-05-16 DIAGNOSIS — I70344 Atherosclerosis of unspecified type of bypass graft(s) of the left leg with ulceration of heel and midfoot: Secondary | ICD-10-CM | POA: Insufficient documentation

## 2016-05-16 NOTE — Therapy (Signed)
[place], [place], [address] Phone: [phone]   Fax:  [phone]  Wound Care Therapy  Patient Details  Name: Sherri Brooks MRN: [id number] Date of Birth: Jun 15, 1946 No Data Recorded  Encounter Date: 05/16/2016      PT End of Session - 05/16/16 1613    Visit Number 24   Number of Visits 26   Date for PT Re-Evaluation 06/06/16   Authorization Type [employer] medicare   Authorization Time Period Gcodes updated visit 18   Authorization - Visit Number 24   Authorization - Number of Visits 26   PT Start Time 1300   PT Stop Time 1345   PT Time Calculation (min) 45 min   Activity Tolerance Patient tolerated treatment well   Behavior During Therapy [facility] for tasks assessed/performed      Past Medical History:  Diagnosis Date  . Arthritis   . CAD (coronary artery disease)   . Cellulitis of buttock, left 2010  . Diabetes mellitus   . GERD (gastroesophageal reflux disease)   . Hyperlipidemia   . Hypertension   . MI (myocardial infarction) ([place])   . PVD (peripheral vascular disease) ([place])   . Wears glasses     Past Surgical History:  Procedure Laterality Date  . ABDOMINAL AORTAGRAM N/A 07/28/2012   Procedure: ABDOMINAL Maxcine Ham;  Surgeon: Angelia Mould, MD;  Location: [facility] CATH LAB;  Service: Cardiovascular;  Laterality: N/A;  . COLONOSCOPY  10/26/2011   Procedure: COLONOSCOPY;  Surgeon: Dorothyann Peng, MD;  [facility];  Service: Endoscopy;  Laterality: N/A;  1:30  . FEMORAL-POPLITEAL BYPASS GRAFT  07/2010   pt has had 3 fem-pop bpg  . FEMORAL-POPLITEAL BYPASS GRAFT  11/04/2012   Procedure: BYPASS GRAFT FEMORAL-POPLITEAL ARTERY;  Surgeon: Angelia Mould, MD;  Location: [facility] OR;  Service: Vascular;  Laterality: Left;  Redo Left Femoral Popliteal Bypass with PTFE  . FOOT AMPUTATION  2010   left  . LOWER EXTREMITY ANGIOGRAM Bilateral 07/28/2012   Procedure: LOWER EXTREMITY ANGIOGRAM;   Surgeon: Angelia Mould, MD;  [facility] C[facility] LAB;  Service: Cardiovascular;  Laterality: Bilateral;  . LOWER EXTREMITY ANGIOGRAM Left 01/02/2016   Procedure: Lower Extremity Angiogram;  Surgeon: Angelia Mould, MD;  Location: [place] CV LAB;  Service: Cardiovascular;  Laterality: Left;  . ORIF ANKLE FRACTURE Left 01/27/2013   Procedure: OPEN REDUCTION INTERNAL FIXATION (ORIF) ANKLE FRACTURE;  Surgeon: Wylene Simmer, MD;  Location: [place];  Service: Orthopedics;  Laterality: Left;  . PERCUTANEOUS STENT INTERVENTION Left 07/28/2012   Procedure: PERCUTANEOUS STENT INTERVENTION;  Surgeon: Angelia Mould, MD;  Location: [facility] CATH LAB;  Service: Cardiovascular;  Laterality: Left;  lt ext tiliac stentx1  . PERIPHERAL VASCULAR CATHETERIZATION N/A 01/02/2016   Procedure: Abdominal Aortogram;  Surgeon: Angelia Mould, MD;  Location: [place] CV LAB;  Service: Cardiovascular;  Laterality: N/A;  . PERIPHERAL VASCULAR CATHETERIZATION Left 01/02/2016   Procedure: Peripheral Vascular Intervention;  Surgeon: Angelia Mould, MD;  Location: [place] CV LAB;  Service: Cardiovascular;  Laterality: Left;  lt ext iliac  . right common iliac stent  10/2009   Dr Doren Custard ([place])  . S/P Hysterecotmy     partial  . TONSILLECTOMY      There were no vitals filed for this visit.                  Wound Therapy - 05/16/16 1601  Subjective Pt states that she went to the ER due to being dizzy.  They told her that she had an inner ear infection.  Pt states she wishes she could get to her appointments like she is suppose to but she has to rely on others for transportation.  Pt has moved back to [place], was living in [place] with daughter.     Patient and Family Stated Goals wound to heal    Date of Onset 11/05/15   Prior Treatments self care   Pain Assessment No/denies pain   Evaluation and Treatment Procedures Explained to Patient/Family Yes    Evaluation and Treatment Procedures agreed to   Wound Properties Date First Assessed: 01/05/16 Time First Assessed: 1435 Wound Type: Other (Comment) Location: Heel Location Orientation: Left Present on Admission: Yes   Dressing Type Gauze (Comment)  medihoney, medipore tape, gauze and netting   Dressing Status Clean;Old drainage   Dressing Change Frequency Every 3 days   Site / Wound Assessment Friable;Yellow   % Wound base Red or Granulating 20%  was 60%   % Wound base Yellow 80%  was 40%   Wound Length (cm) 1 cm   Wound Width (cm) 0.5 cm   Wound Depth (cm) 0.5 cm  at least.   Margins Unattached edges (unapproximated)   Closure None   Drainage Amount Minimal   Wound Properties Date First Assessed: 11/10/15 Time First Assessed: 1455 Wound Type: Other (Comment) Location: Foot Location Orientation: Left Wound Description (Comments): lateral aspect of transmetatarsal ampuation    Dressing Type Impregnated gauze (petrolatum);Gauze (Comment)  xerform, medipore tape, gauze and netting   Dressing Status Old drainage   Dressing Change Frequency Every 3 days   Site / Wound Assessment Dry;Red;Pink;Yellow   % Wound base Red or Granulating 75%  was 95   % Wound base Yellow 25%  was 5   Margins Unattached edges (unapproximated)   Drainage Amount Minimal   Drainage Description Serous   Selective Debridement - Location edges and wound bed    Selective Debridement - Tools Used Forceps;Scalpel   Selective Debridement - Tissue Removed biofilm off of metatarsal wound; slough from heel and edipole edges   Wound Therapy - Clinical Statement Pt has not been seen in a week due to dizziness.  Pt wounds continue to be very slow in healing although pt states that she has quite smoking.  Therapist noted area in heel wound that with probing is at least .5 cm deep.  Heel continues to have soft feel in certain areas.     Wound Therapy - Functional Problem List increased pain with increased walking .   Factors  Delaying/Impairing Wound Healing Altered sensation;Diabetes Mellitus;Multiple medical problems;Polypharmacy;Vascular compromise   Hydrotherapy Plan Debridement;Dressing change   Wound Therapy - Frequency 3X / week   Wound Therapy - Current Recommendations PT   Wound Plan Continue appropriate wound care to Bil wounds,    Dressing  xeroform to metatarsal; medihoney with tape and gauze dressings                   PT Short Term Goals - 04/16/16 1616      PT SHORT TERM GOAL #1   Title Pt necrotic tissue to be 25%   Time 2   Period Weeks   Status Achieved     PT SHORT TERM GOAL #2   Title Pt  granulated tissue to be 75%   Time 2   Period Days   Status Achieved  PT SHORT TERM GOAL #3   Title Pt to verbalize the importance of keeping her leg in a dependent postion   Time 2   Period Weeks   Status Achieved     PT SHORT TERM GOAL #4   Title Pt pain to be no greater than a 4/10   Time 2   Period Weeks   Status Achieved           PT Long Term Goals - 04/16/16 1622      PT LONG TERM GOAL #1   Title Pt necrotic tissue to be 0%   Time 4   Period Weeks   Status Achieved  achieved for initial wound; ongoing heel     PT LONG TERM GOAL #2   Title Pt wound to be granulated 100%    Time 4   Period Weeks   Status Achieved     PT LONG TERM GOAL #3   Title Pt wound size to be decreased by 1x .25x.2 cm    Baseline 2.2x .7x .3   Time 4   Period Weeks   Status Partially Met     PT LONG TERM GOAL #4   Title ADDed: 3/32/2017:  Began treatment on heel:  Heel to be 100% granulated   Time 6   Period Weeks   Status On-going     PT LONG TERM GOAL #5   Title Heel wound to be .5x.5 with a depth no greater than .5   Baseline wound was 100% grey necrotic tissue opening of 1.2x .9 with a depth of at least 1.5 and undermining throughout wound of .5 cm    Time 6   Period Weeks   Status On-going               Plan - 05/16/16 1614    Clinical Impression  Statement as above    Rehab Potential Good   PT Frequency 2x / week   PT Treatment/Interventions Other (comment)      Patient will benefit from skilled therapeutic intervention in order to improve the following deficits and impairments:  Abnormal gait, Other (comment)  Visit Diagnosis: Pain in left foot  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 Ankle fracture, bimalleolar, closed 12/29/2012  . Anemia of unknown etiology 11/06/2012  . Atherosclerotic PVD with ulceration ([place]) 11/04/2012  . Type II or unspecified type diabetes mellitus with peripheral circulatory disorders, uncontrolled(250.72) 11/04/2012  . Tobacco use disorder 11/04/2012  . Open wound of left foot 11/04/2012  . Atherosclerosis of native arteries of the extremities with ulceration(440.23) 07/09/2012  . Weight loss, unintentional 09/18/2011  . Anorexia 09/18/2011  . Early satiety 09/18/2011  . 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/07/2011    [name], PT CLT [phone] 05/16/2016, 4:15 PM  [place] [address] [place], [place], [address] Phone: [phone]   Fax:  [phone]  Name: Sherri Brooks MRN: [id number] Date of Birth: [birth year]

## 2016-05-18 ENCOUNTER — Ambulatory Visit (HOSPITAL_COMMUNITY): Payer: Medicare HMO | Admitting: Physical Therapy

## 2016-05-18 ENCOUNTER — Telehealth (HOSPITAL_COMMUNITY): Payer: Self-pay | Admitting: Physical Therapy

## 2016-05-18 DIAGNOSIS — I70244 Atherosclerosis of native arteries of left leg with ulceration of heel and midfoot: Secondary | ICD-10-CM

## 2016-05-18 DIAGNOSIS — M79672 Pain in left foot: Secondary | ICD-10-CM | POA: Diagnosis not present

## 2016-05-18 DIAGNOSIS — I70334 Atherosclerosis of unspecified type of bypass graft(s) of the right leg with ulceration of heel and midfoot: Secondary | ICD-10-CM

## 2016-05-18 DIAGNOSIS — R2681 Unsteadiness on feet: Secondary | ICD-10-CM

## 2016-05-18 NOTE — Telephone Encounter (Signed)
Called pt to see if they could come 15 minutes early.  Pt requested to come at 12:00; therapist agreed.  [name], [place] CLT [phone]

## 2016-05-18 NOTE — Therapy (Signed)
[place], [place], [address] Phone: [phone]   Fax:  [phone]  Wound Care Therapy  Patient Details  Name: IEESHA ABBASI MRN: [id number] Date of Birth: 10/01/1946 No Data Recorded  Encounter Date: 05/18/2016      PT End of Session - 05/18/16 1409    Visit Number 25   Number of Visits 26   Date for PT Re-Evaluation 06/06/16   Authorization Type [employer] medicare   Authorization Time Period Gcodes updated visit 18   Authorization - Visit Number 25   Authorization - Number of Visits 26   PT Start Time 1224   PT Stop Time 1250   PT Time Calculation (min) 26 min   Activity Tolerance Patient tolerated treatment well   Behavior During Therapy [facility] for tasks assessed/performed      Past Medical History:  Diagnosis Date  . Arthritis   . CAD (coronary artery disease)   . Cellulitis of buttock, left 2010  . Diabetes mellitus   . GERD (gastroesophageal reflux disease)   . Hyperlipidemia   . Hypertension   . MI (myocardial infarction) ([place])   . PVD (peripheral vascular disease) ([place])   . Wears glasses     Past Surgical History:  Procedure Laterality Date  . ABDOMINAL AORTAGRAM N/A 07/28/2012   Procedure: ABDOMINAL Maxcine Ham;  Surgeon: Angelia Mould, MD;  Location: [facility] CATH LAB;  Service: Cardiovascular;  Laterality: N/A;  . COLONOSCOPY  10/26/2011   Procedure: COLONOSCOPY;  Surgeon: Dorothyann Peng, MD;  [facility];  Service: Endoscopy;  Laterality: N/A;  1:30  . FEMORAL-POPLITEAL BYPASS GRAFT  07/2010   pt has had 3 fem-pop bpg  . FEMORAL-POPLITEAL BYPASS GRAFT  11/04/2012   Procedure: BYPASS GRAFT FEMORAL-POPLITEAL ARTERY;  Surgeon: Angelia Mould, MD;  Location: [facility] OR;  Service: Vascular;  Laterality: Left;  Redo Left Femoral Popliteal Bypass with PTFE  . FOOT AMPUTATION  2010   left  . LOWER EXTREMITY ANGIOGRAM Bilateral 07/28/2012   Procedure: LOWER EXTREMITY ANGIOGRAM;   Surgeon: Angelia Mould, MD;  Location: [facility] CATH LAB;  Service: Cardiovascular;  Laterality: Bilateral;  . LOWER EXTREMITY ANGIOGRAM Left 01/02/2016   Procedure: Lower Extremity Angiogram;  Surgeon: Angelia Mould, MD;  Location: [place] CV LAB;  Service: Cardiovascular;  Laterality: Left;  . ORIF ANKLE FRACTURE Left 01/27/2013   Procedure: OPEN REDUCTION INTERNAL FIXATION (ORIF) ANKLE FRACTURE;  Surgeon: Wylene Simmer, MD;  Location: [place];  Service: Orthopedics;  Laterality: Left;  . PERCUTANEOUS STENT INTERVENTION Left 07/28/2012   Procedure: PERCUTANEOUS STENT INTERVENTION;  Surgeon: Angelia Mould, MD;  Location: [facility] CATH LAB;  Service: Cardiovascular;  Laterality: Left;  lt ext tiliac stentx1  . PERIPHERAL VASCULAR CATHETERIZATION N/A 01/02/2016   Procedure: Abdominal Aortogram;  Surgeon: Angelia Mould, MD;  Location: [place] CV LAB;  Service: Cardiovascular;  Laterality: N/A;  . PERIPHERAL VASCULAR CATHETERIZATION Left 01/02/2016   Procedure: Peripheral Vascular Intervention;  Surgeon: Angelia Mould, MD;  Location: [place] CV LAB;  Service: Cardiovascular;  Laterality: Left;  lt ext iliac  . right common iliac stent  10/2009   Dr Doren Custard ([place])  . S/P Hysterecotmy     partial  . TONSILLECTOMY      There were no vitals filed for this visit.                  Wound Therapy - 05/18/16 1258  Subjective Pt states that her dizziness is gone.     Patient and Family Stated Goals wound to heal    Date of Onset 11/05/15   Prior Treatments self care   Pain Assessment No/denies pain   Evaluation and Treatment Procedures Explained to Patient/Family Yes   Evaluation and Treatment Procedures agreed to   Wound Properties Date First Assessed: 01/05/16 Time First Assessed: 1435 Wound Type: Other (Comment) Location: Heel Location Orientation: Left Present on Admission: Yes   Dressing Type Gauze (Comment)  medihoney, medipore  tape, gauze and netting   Dressing Status Clean;Old drainage   Dressing Change Frequency Every 3 days   Site / Wound Assessment Friable;Yellow   % Wound base Red or Granulating 20%  was 60%   % Wound base Yellow 80%  was 40%   Margins Unattached edges (unapproximated)   Closure None   Drainage Amount Minimal   Wound Properties Date First Assessed: 11/10/15 Time First Assessed: 1455 Wound Type: Other (Comment) Location: Foot Location Orientation: Left Wound Description (Comments): lateral aspect of transmetatarsal ampuation    Dressing Type Impregnated gauze (petrolatum);Gauze (Comment)  xerform, medipore tape, gauze and netting   Dressing Status Old drainage   Dressing Change Frequency Every 3 days   Site / Wound Assessment Dry;Red;Pink;Yellow   % Wound base Red or Granulating 75%  was 95   % Wound base Yellow 25%  was 5   Margins Unattached edges (unapproximated)   Drainage Amount Minimal   Drainage Description Serous   Selective Debridement - Location edges and wound bed    Selective Debridement - Tools Used Forceps;Scalpel   Selective Debridement - Tissue Removed biofilm off of metatarsal wound; slough from heel and edipole edges   Wound Therapy - Clinical Statement Heel remains soft to the feel, pale and cold.  Lateral wound also remains pale although pt has stopped smoking so hopefully this will help blood flow.     Wound Therapy - Functional Problem List increased pain with increased walking .   Factors Delaying/Impairing Wound Healing Altered sensation;Diabetes Mellitus;Multiple medical problems;Polypharmacy;Vascular compromise   Hydrotherapy Plan Debridement;Dressing change   Wound Therapy - Frequency 3X / week   Wound Therapy - Current Recommendations PT   Wound Plan Continue appropriate wound care to Bil wounds, Re-measure with extension next visit   Dressing  xeroform to metatarsal; medihoney to heel followed by 2x2 and [place].                    PT Short  Term Goals - 05/18/16 1410      PT SHORT TERM GOAL #1   Title Pt necrotic tissue to be 25%   Time 2   Period Weeks   Status Achieved     PT SHORT TERM GOAL #2   Title Pt  granulated tissue to be 75%   Time 2   Period Days   Status Achieved     PT SHORT TERM GOAL #3   Title Pt to verbalize the importance of keeping her leg in a dependent postion   Time 2   Period Weeks   Status Achieved     PT SHORT TERM GOAL #4   Title Pt pain to be no greater than a 4/10   Time 2   Period Weeks   Status Achieved           PT Long Term Goals - 05/18/16 1411      PT LONG TERM GOAL #1   Title  Pt necrotic tissue to be 0%   Time 4   Period Weeks   Status On-going  achieved for initial wound; ongoing heel     PT LONG TERM GOAL #2   Title Pt wound to be granulated 100%    Time 4   Period Weeks   Status On-going     PT LONG TERM GOAL #3   Title Pt wound size to be decreased by 1x .25x.2 cm    Baseline 2.2x .7x .3   Time 4   Period Weeks   Status Partially Met     PT LONG TERM GOAL #4   Title ADDed: 3/32/2017:  Began treatment on heel:  Heel to be 100% granulated   Time 6   Period Weeks   Status On-going     PT LONG TERM GOAL #5   Title Heel wound to be .5x.5 with a depth no greater than .5   Baseline wound was 100% grey necrotic tissue opening of 1.2x .9 with a depth of at least 1.5 and undermining throughout wound of .5 cm    Time 6   Period Weeks   Status On-going               Plan - 05/18/16 1410    Clinical Impression Statement See above    Rehab Potential Good   PT Frequency 2x / week   PT Treatment/Interventions Other (comment)      Patient will benefit from skilled therapeutic intervention in order to improve the following deficits and impairments:  Abnormal gait, Other (comment)  Visit Diagnosis: Pain in left foot  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 Ankle fracture, bimalleolar, closed 12/29/2012  . Anemia of unknown etiology 11/06/2012  . Atherosclerotic PVD with ulceration ([place]) 11/04/2012  . Type II or unspecified type diabetes mellitus with peripheral circulatory disorders, uncontrolled(250.72) 11/04/2012  . Tobacco use disorder 11/04/2012  . Open wound of left foot 11/04/2012  . Atherosclerosis of native arteries of the extremities with ulceration(440.23) 07/09/2012  . Weight loss, unintentional 09/18/2011  . Anorexia 09/18/2011  . Early satiety 09/18/2011  . 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/07/2011  [name], PT CLT [phone] 05/18/2016, 2:12 PM  [place] [address] [place], [place], [address] Phone: [phone]   Fax:  [phone]  Name: SAYLEE SHERRILL MRN: [id number] Date of Birth: Jan 22, 1946

## 2016-05-18 NOTE — Telephone Encounter (Signed)
cx

## 2016-05-23 ENCOUNTER — Ambulatory Visit (HOSPITAL_COMMUNITY): Payer: Medicare HMO

## 2016-05-23 ENCOUNTER — Telehealth (HOSPITAL_COMMUNITY): Payer: Self-pay

## 2016-05-23 NOTE — Telephone Encounter (Signed)
Lack of Transportation

## 2016-05-25 DIAGNOSIS — Z6824 Body mass index (BMI) 24.0-24.9, adult: Secondary | ICD-10-CM | POA: Diagnosis not present

## 2016-05-25 DIAGNOSIS — E1159 Type 2 diabetes mellitus with other circulatory complications: Secondary | ICD-10-CM | POA: Diagnosis not present

## 2016-05-25 DIAGNOSIS — E1165 Type 2 diabetes mellitus with hyperglycemia: Secondary | ICD-10-CM | POA: Diagnosis not present

## 2016-05-25 DIAGNOSIS — I10 Essential (primary) hypertension: Secondary | ICD-10-CM | POA: Diagnosis not present

## 2016-05-25 DIAGNOSIS — L98499 Non-pressure chronic ulcer of skin of other sites with unspecified severity: Secondary | ICD-10-CM | POA: Diagnosis not present

## 2016-05-25 DIAGNOSIS — E782 Mixed hyperlipidemia: Secondary | ICD-10-CM | POA: Diagnosis not present

## 2016-05-25 DIAGNOSIS — I7389 Other specified peripheral vascular diseases: Secondary | ICD-10-CM | POA: Diagnosis not present

## 2016-05-25 DIAGNOSIS — E1140 Type 2 diabetes mellitus with diabetic neuropathy, unspecified: Secondary | ICD-10-CM | POA: Diagnosis not present

## 2016-05-30 ENCOUNTER — Ambulatory Visit (HOSPITAL_COMMUNITY): Payer: Medicare HMO | Admitting: Physical Therapy

## 2016-05-30 DIAGNOSIS — I70344 Atherosclerosis of unspecified type of bypass graft(s) of the left leg with ulceration of heel and midfoot: Secondary | ICD-10-CM

## 2016-05-30 DIAGNOSIS — M79672 Pain in left foot: Secondary | ICD-10-CM | POA: Diagnosis not present
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
## 2016-06-01 ENCOUNTER — Ambulatory Visit (HOSPITAL_COMMUNITY): Payer: Medicare HMO

## 2016-06-01 DIAGNOSIS — M79672 Pain in left foot: Secondary | ICD-10-CM

## 2016-06-01 DIAGNOSIS — I70344 Atherosclerosis of unspecified type of bypass graft(s) of the left leg with ulceration of heel and midfoot: Secondary | ICD-10-CM

## 2016-06-01 NOTE — Therapy (Signed)
[facility] [place], [place], [address] Phone: [phone]   Fax:  [phone]  Wound Care Therapy  Patient Details  Name: Sherri Brooks MRN: [id number] Date of Birth: 12-27-1945 No Data Recorded  Encounter Date: 06/01/2016      PT End of Session - 06/01/16 1716    Visit Number 27   Number of Visits 34   Date for PT Re-Evaluation 06/06/16   Authorization Type [employer] medicare   Authorization Time Period Gcodes updated visit 26   Authorization - Visit Number 26   Authorization - Number of Visits 34   PT Start Time 6256   PT Stop Time 1427   PT Time Calculation (min) 32 min   Activity Tolerance Patient tolerated treatment well   Behavior During Therapy WFL for tasks assessed/performed      Past Medical History:  Diagnosis Date  . Arthritis   . CAD (coronary artery disease)   . Cellulitis of buttock, left 2010  . Diabetes mellitus   . GERD (gastroesophageal reflux disease)   . Hyperlipidemia   . Hypertension   . MI (myocardial infarction) ([place])   . PVD (peripheral vascular disease) ([place])   . Wears glasses     Past Surgical History:  Procedure Laterality Date  . ABDOMINAL AORTAGRAM N/A 07/28/2012   Procedure: ABDOMINAL Maxcine Ham;  Surgeon: Angelia Mould, MD;  Location: [facility] CATH LAB;  Service: Cardiovascular;  Laterality: N/A;  . COLONOSCOPY  10/26/2011   Procedure: COLONOSCOPY;  Surgeon: Dorothyann Peng, MD;  [facility];  Service: Endoscopy;  Laterality: N/A;  1:30  . FEMORAL-POPLITEAL BYPASS GRAFT  07/2010   pt has had 3 fem-pop bpg  . FEMORAL-POPLITEAL BYPASS GRAFT  11/04/2012   Procedure: BYPASS GRAFT FEMORAL-POPLITEAL ARTERY;  Surgeon: Angelia Mould, MD;  Location: [facility] OR;  Service: Vascular;  Laterality: Left;  Redo Left Femoral Popliteal Bypass with PTFE  . FOOT AMPUTATION  2010   left  . LOWER EXTREMITY ANGIOGRAM Bilateral 07/28/2012   Procedure: LOWER EXTREMITY ANGIOGRAM;   Surgeon: Angelia Mould, MD;  Location: [facility] CATH LAB;  Service: Cardiovascular;  Laterality: Bilateral;  . LOWER EXTREMITY ANGIOGRAM Left 01/02/2016   Procedure: Lower Extremity Angiogram;  Surgeon: Angelia Mould, MD;  Location: [place] CV LAB;  Service: Cardiovascular;  Laterality: Left;  . ORIF ANKLE FRACTURE Left 01/27/2013   Procedure: OPEN REDUCTION INTERNAL FIXATION (ORIF) ANKLE FRACTURE;  Surgeon: Wylene Simmer, MD;  Location: [place];  Service: Orthopedics;  Laterality: Left;  . PERCUTANEOUS STENT INTERVENTION Left 07/28/2012   Procedure: PERCUTANEOUS STENT INTERVENTION;  Surgeon: Angelia Mould, MD;  Location: [facility] CATH LAB;  Service: Cardiovascular;  Laterality: Left;  lt ext tiliac stentx1  . PERIPHERAL VASCULAR CATHETERIZATION N/A 01/02/2016   Procedure: Abdominal Aortogram;  Surgeon: Angelia Mould, MD;  Location: [place] CV LAB;  Service: Cardiovascular;  Laterality: N/A;  . PERIPHERAL VASCULAR CATHETERIZATION Left 01/02/2016   Procedure: Peripheral Vascular Intervention;  Surgeon: Angelia Mould, MD;  Location: [place] CV LAB;  Service: Cardiovascular;  Laterality: Left;  lt ext iliac  . right common iliac stent  10/2009   Dr Doren Custard ([place])  . S/P Hysterecotmy     partial  . TONSILLECTOMY      There were no vitals filed for this visit.       Subjective Assessment - 06/01/16 1431    Subjective Pt arrived with dressing intact, no reports of pain this  session.  Pt reports it has been 2 months since she smoked and good progress reported with last MD apt.  Reports she feels safe with her new home.     Currently in Pain? No/denies                   Wound Therapy - 06/01/16 1711    Subjective Pt arrived with dressing intact, no reports of pain this session.  Pt reports it has been 2 months since she smoked and good progress reported with last MD apt.  Reports she feels safe with her new home.     Patient and Family  Stated Goals wound to heal    Date of Onset 11/05/15   Prior Treatments self care   Pain Assessment No/denies pain   Evaluation and Treatment Procedures Explained to Patient/Family Yes   Evaluation and Treatment Procedures agreed to   Wound Properties Date First Assessed: 01/05/16 Time First Assessed: 1435 Wound Type: Other (Comment) Location: Heel Location Orientation: Left Present on Admission: Yes   Dressing Type --  medihoney, 2x2, medipore tape, gauze and netting   Dressing Status Clean;Old drainage   Dressing Change Frequency Every 3 days   Site / Wound Assessment Friable;Yellow   % Wound base Red or Granulating 40%   % Wound base Yellow 60%   Margins Epibole (rolled edges)   Closure None   Drainage Amount Minimal   Wound Properties Date First Assessed: 11/10/15 Time First Assessed: 1455 Wound Type: Other (Comment) Location: Foot Location Orientation: Left Wound Description (Comments): lateral aspect of transmetatarsal ampuation    Dressing Type Gauze (Comment);Silver dressings  acticoat, 2x2, medipore tape, gauze and netting   Dressing Status Old drainage   Dressing Change Frequency Every 3 days   Site / Wound Assessment Dry;Red;Pink;Yellow   % Wound base Red or Granulating 80%   % Wound base Yellow 20%   Peri-wound Assessment Intact   Margins Epibole (rolled edges)   Drainage Amount Minimal   Drainage Description Serous   Selective Debridement - Location edges and wound bed    Selective Debridement - Tools Used Forceps;Scalpel   Selective Debridement - Tissue Removed biofilm off of metatarsal wound; slough from heel and edipole edges   Wound Therapy - Clinical Statement Wound progressing well toward healing, noted improvements darker pale pink following reports of 2 months since last smoked.  Selective debridement for removal of biofilm on metatarsal region and slough from heel and epibole edges surrounding wound to promote healing.  No reports of pain through session.      Wound Therapy - Functional Problem List increased pain with increased walking .   Factors Delaying/Impairing Wound Healing Altered sensation;Diabetes Mellitus;Multiple medical problems;Polypharmacy;Vascular compromise   Hydrotherapy Plan Debridement;Dressing change   Wound Therapy - Frequency 3X / week   Wound Therapy - Current Recommendations PT   Wound Plan Continue appropriate wound care to Bil wounds,    Dressing  acticoat to metatarsal; medihoney to heel followed by 2x2 and kling.                    PT Short Term Goals - 05/30/16 1421      PT SHORT TERM GOAL #1   Title Pt necrotic tissue to be 25%   Time 2   Period Weeks   Status Achieved     PT SHORT TERM GOAL #2   Title Pt  granulated tissue to be 75%   Time 2   Period Days  Status Achieved     PT SHORT TERM GOAL #3   Title Pt to verbalize the importance of keeping her leg in a dependent postion   Time 2   Period Weeks   Status Achieved     PT SHORT TERM GOAL #4   Title Pt pain to be no greater than a 4/10   Time 2   Period Weeks   Status Achieved           PT Long Term Goals - 05/30/16 1421      PT LONG TERM GOAL #1   Title Pt necrotic tissue to be 0%   Time 4   Period Weeks   Status On-going  achieved for initial wound; ongoing heel     PT LONG TERM GOAL #2   Title Pt wound to be granulated 100%    Time 4   Period Weeks   Status On-going     PT LONG TERM GOAL #3   Title Pt wound size to be decreased by 1x .25x.2 cm    Baseline 2.2x .7x .3   Time 4   Period Weeks   Status Partially Met     PT LONG TERM GOAL #4   Title ADDed: 3/32/2017:  Began treatment on heel:  Heel to be 100% granulated   Time 6   Period Weeks   Status On-going     PT LONG TERM GOAL #5   Title Heel wound to be .5x.5 with a depth no greater than .5   Baseline wound was 100% grey necrotic tissue opening of 1.2x .9 with a depth of at least 1.5 and undermining throughout wound of .5 cm    Time 6   Period Weeks    Status On-going             Patient will benefit from skilled therapeutic intervention in order to improve the following deficits and impairments:     Visit Diagnosis: 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 Ankle fracture, bimalleolar, closed 12/29/2012  . Anemia of unknown etiology 11/06/2012  . Atherosclerotic PVD with ulceration ([place]) 11/04/2012  . Type II or unspecified type diabetes mellitus with peripheral circulatory disorders, uncontrolled(250.72) 11/04/2012  . Tobacco use disorder 11/04/2012  . Open wound of left foot 11/04/2012  . Atherosclerosis of native arteries of the extremities with ulceration(440.23) 07/09/2012  . Weight loss, unintentional 09/18/2011  . Anorexia 09/18/2011  . Early satiety 09/18/2011  . 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/07/2011   Ihor Austin, LPTA; CBIS [phone]  Aldona Lento 06/01/2016, 5:17 PM  [place], [place], [address] Phone: [phone]   Fax:  [phone]  Name: Sherri Brooks MRN: [id number] Date of Birth: [birth year]

## 2016-06-05 ENCOUNTER — Telehealth (HOSPITAL_COMMUNITY): Payer: Self-pay | Admitting: Physical Therapy

## 2016-06-05 ENCOUNTER — Ambulatory Visit (HOSPITAL_COMMUNITY): Payer: Medicare HMO | Admitting: Physical Therapy

## 2016-06-05 NOTE — Telephone Encounter (Signed)
Patient did not show for appointment.  Called and left message on voicemail regarding missed appt and reminder for next appt on Thursday at 2:30.  Left clinic number to call if she cannot make this appt.  Teena Irani, PTA/CLT [phone]

## 2016-06-05 NOTE — Telephone Encounter (Signed)
pt's friend called to say he thought all her apptments were on Tuesday and Thursdays, they missed her apptment and he is sorry. NF 06/05/16 3:22 pm

## 2016-06-07 ENCOUNTER — Ambulatory Visit (HOSPITAL_COMMUNITY): Payer: Medicare HMO | Admitting: Physical Therapy

## 2016-06-07 DIAGNOSIS — I70344 Atherosclerosis of unspecified type of bypass graft(s) of the left leg with ulceration of heel and midfoot: Secondary | ICD-10-CM

## 2016-06-07 DIAGNOSIS — M79672 Pain in left foot: Secondary | ICD-10-CM | POA: Diagnosis not present

## 2016-06-07 NOTE — Therapy (Signed)
[place], [place], [address] Phone: [phone]   Fax:  [phone]  Wound Care Therapy  Patient Details  Name: Sherri Brooks MRN: [id number] Date of Birth: 1945-11-03 No Data Recorded  Encounter Date: 06/07/2016      PT End of Session - 06/07/16 1450    Visit Number 28   Number of Visits 34   Date for PT Re-Evaluation 06/06/16   Authorization Type U[employer] medicare   Authorization Time Period Gcodes updated visit 26   Authorization - Visit Number 28   Authorization - Number of Visits 34   PT Start Time 1400   PT Stop Time 1430   PT Time Calculation (min) 30 min   Activity Tolerance Patient tolerated treatment well   Behavior During Therapy [facility] for tasks assessed/performed      Past Medical History:  Diagnosis Date  . Arthritis   . CAD (coronary artery disease)   . Cellulitis of buttock, left 2010  . Diabetes mellitus   . GERD (gastroesophageal reflux disease)   . Hyperlipidemia   . Hypertension   . MI (myocardial infarction) ([place])   . PVD (peripheral vascular disease) ([place])   . Wears glasses     Past Surgical History:  Procedure Laterality Date  . ABDOMINAL AORTAGRAM N/A 07/28/2012   Procedure: ABDOMINAL Maxcine Ham;  Surgeon: Angelia Mould, MD;  Location: [facility] C[facility] LAB;  Service: Cardiovascular;  Laterality: N/A;  . COLONOSCOPY  10/26/2011   Procedure: COLONOSCOPY;  Surgeon: Dorothyann Peng, MD;  [facility];  Service: Endoscopy;  Laterality: N/A;  1:30  . FEMORAL-POPLITEAL BYPASS GRAFT  07/2010   pt has had 3 fem-pop bpg  . FEMORAL-POPLITEAL BYPASS GRAFT  11/04/2012   Procedure: BYPASS GRAFT FEMORAL-POPLITEAL ARTERY;  Surgeon: Angelia Mould, MD;  Location: [facility] OR;  Service: Vascular;  Laterality: Left;  Redo Left Femoral Popliteal Bypass with PTFE  . FOOT AMPUTATION  2010   left  . LOWER EXTREMITY ANGIOGRAM Bilateral 07/28/2012   Procedure: LOWER EXTREMITY ANGIOGRAM;   Surgeon: Angelia Mould, MD;  Location: [facility] CATH LAB;  Service: Cardiovascular;  Laterality: Bilateral;  . LOWER EXTREMITY ANGIOGRAM Left 01/02/2016   Procedure: Lower Extremity Angiogram;  Surgeon: Angelia Mould, MD;  Location: [facility] CV LAB;  Service: Cardiovascular;  Laterality: Left;  . ORIF ANKLE FRACTURE Left 01/27/2013   Procedure: OPEN REDUCTION INTERNAL FIXATION (ORIF) ANKLE FRACTURE;  Surgeon: Wylene Simmer, MD;  Location: [place];  Service: Orthopedics;  Laterality: Left;  . PERCUTANEOUS STENT INTERVENTION Left 07/28/2012   Procedure: PERCUTANEOUS STENT INTERVENTION;  Surgeon: Angelia Mould, MD;  Location: [facility] CATH LAB;  Service: Cardiovascular;  Laterality: Left;  lt ext tiliac stentx1  . PERIPHERAL VASCULAR CATHETERIZATION N/A 01/02/2016   Procedure: Abdominal Aortogram;  Surgeon: Angelia Mould, MD;  Location: [place] CV LAB;  Service: Cardiovascular;  Laterality: N/A;  . PERIPHERAL VASCULAR CATHETERIZATION Left 01/02/2016   Procedure: Peripheral Vascular Intervention;  Surgeon: Angelia Mould, MD;  Location: [place] CV LAB;  Service: Cardiovascular;  Laterality: Left;  lt ext iliac  . right common iliac stent  10/2009   Dr Doren Custard ([place])  . S/P Hysterecotmy     partial  . TONSILLECTOMY      There were no vitals filed for this visit.                  Wound Therapy - 06/07/16 1442  Subjective Pt states that she is not having any pain in her heel any longer but does occasionally have pain on the side of her foot.  She is ready for her wound to heal.    Patient and Family Stated Goals wound to heal    Date of Onset 11/05/15   Prior Treatments self care   Pain Assessment No/denies pain   Evaluation and Treatment Procedures Explained to Patient/Family Yes   Evaluation and Treatment Procedures agreed to   Wound Properties Date First Assessed: 01/05/16 Time First Assessed: 1435 Wound Type: Other (Comment)  Location: Heel Location Orientation: Left Present on Admission: Yes   Dressing Type --  medihoney, 2x2, medipore tape, gauze and netting   Dressing Status Clean;Old drainage   Dressing Change Frequency Every 3 days   Site / Wound Assessment Friable;Yellow   % Wound base Red or Granulating 50%   % Wound base Yellow 50%   Wound Length (cm) 1 cm  was .7   Wound Width (cm) 1 cm  was .5   Wound Depth (cm) 0.3 cm   Undermining (cm) continues to undermine   Margins Epibole (rolled edges)   Closure None   Drainage Amount Minimal   Treatment Cleansed;Debridement (Selective)  changed dressing to silverhydrofiber as heel was macerated   Wound Properties Date First Assessed: 11/10/15 Time First Assessed: 1455 Wound Type: Other (Comment) Location: Foot Location Orientation: Left Wound Description (Comments): lateral aspect of transmetatarsal ampuation    Dressing Type Gauze (Comment);Silver dressings  acticoat, 2x2, medipore tape, gauze and netting   Dressing Status Old drainage   Dressing Change Frequency Every 3 days   Site / Wound Assessment Pink;Red;Yellow   % Wound base Red or Granulating 80%   % Wound base Yellow 20%   Peri-wound Assessment Intact   Wound Length (cm) 2 cm  was .9   Wound Width (cm) 0.8 cm  was 1.2   Wound Depth (cm) 0.2 cm  was .3    Margins Epibole (rolled edges)   Drainage Amount Minimal   Drainage Description Serous   Selective Debridement - Location edges and wound bed    Selective Debridement - Tools Used Forceps;Scissors   Selective Debridement - Tissue Removed biofilm off of metatarsal wound; slough from heel and edipole edges   Wound Therapy - Clinical Statement Wounds continue to progress very slowly.  Changed heal dressing to silverhydrofiber.  Pt will continue to be seen once a week due to slow progess.    Wound Therapy - Functional Problem List increased pain with increased walking .   Factors Delaying/Impairing Wound Healing Altered sensation;Diabetes  Mellitus;Multiple medical problems;Polypharmacy;Vascular compromise   Hydrotherapy Plan Debridement;Dressing change   Wound Therapy - Frequency --   Wound Therapy - Current Recommendations PT   Wound Plan Continue appropriate wound care to Bil wounds,    Dressing  acticoat to metatarsal; silver hydrofiber  to heel followed by 2x2 and kling.                  PT Education - 06/07/16 1450    Education provided Yes   Education Details keep banadage in tack for at least 3 days then she may take off to shower and redress   Person(s) Educated Patient   Methods Explanation   Comprehension Verbalized understanding          PT Short Term Goals - 06/07/16 1452      PT SHORT TERM GOAL #1   Title Pt necrotic tissue  to be 25%   Time 2   Period Weeks   Status Achieved     PT SHORT TERM GOAL #2   Title Pt  granulated tissue to be 75%   Time 2   Period Days   Status Achieved     PT SHORT TERM GOAL #3   Title Pt to verbalize the importance of keeping her leg in a dependent postion   Time 2   Period Weeks   Status Achieved     PT SHORT TERM GOAL #4   Title Pt pain to be no greater than a 4/10   Time 2   Period Weeks   Status Achieved           PT Long Term Goals - 06/07/16 1452      PT LONG TERM GOAL #1   Title Pt necrotic tissue to be 0%   Time 4   Period Weeks   Status On-going  achieved for initial wound; ongoing heel     PT LONG TERM GOAL #2   Title Pt wound to be granulated 100%    Time 4   Period Weeks   Status On-going     PT LONG TERM GOAL #3   Title Pt wound size to be decreased by 1x .25x.2 cm    Baseline 2.2x .7x .3   Time 4   Period Weeks   Status Partially Met     PT LONG TERM GOAL #4   Title ADDed: 3/32/2017:  Began treatment on heel:  Heel to be 100% granulated   Time 6   Period Weeks   Status On-going     PT LONG TERM GOAL #5   Title Heel wound to be .5x.5 with a depth no greater than .5   Baseline wound was 100% grey necrotic  tissue opening of 1.2x .9 with a depth of at least 1.5 and undermining throughout wound of .5 cm    Time 6   Period Weeks   Status On-going               Plan - 06/07/16 1451    Clinical Impression Statement See above    Rehab Potential Good   PT Frequency 2x / week   PT Treatment/Interventions Other (comment)   PT Next Visit Plan continue with debridement and dressing change       Patient will benefit from skilled therapeutic intervention in order to improve the following deficits and impairments:  Abnormal gait, Other (comment)  Visit Diagnosis: Atherosclerosis 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 Ankle fracture, bimalleolar, closed 12/29/2012  . Anemia of unknown etiology 11/06/2012  . Atherosclerotic PVD with ulceration ([place]) 11/04/2012  . Type II or unspecified type diabetes mellitus with peripheral circulatory disorders, uncontrolled(250.72) 11/04/2012  . Tobacco use disorder 11/04/2012  . Open wound of left foot 11/04/2012  . Atherosclerosis of native arteries of the extremities with ulceration(440.23) 07/09/2012  . Weight loss, unintentional 09/18/2011  . Anorexia 09/18/2011  . Early satiety 09/18/2011  . 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/07/2011    [name], PT CLT [phone] 06/07/2016, 2:53 PM  [facility] [address] [place], [place], [address] Phone: [phone]   Fax:  [phone]  Name: Sherri Brooks MRN: [id number]  Date of Birth: 1946-09-15

## 2016-06-13 ENCOUNTER — Ambulatory Visit (HOSPITAL_COMMUNITY): Payer: Medicare HMO

## 2016-06-13 ENCOUNTER — Telehealth (HOSPITAL_COMMUNITY): Payer: Self-pay

## 2016-06-13 NOTE — Telephone Encounter (Signed)
8/30 pt said she wouldn't be coming today

## 2016-06-15 ENCOUNTER — Ambulatory Visit (HOSPITAL_COMMUNITY): Payer: Medicare HMO | Attending: Family Medicine

## 2016-06-15 DIAGNOSIS — I70344 Atherosclerosis of unspecified type of bypass graft(s) of the left leg with ulceration of heel and midfoot: Secondary | ICD-10-CM | POA: Insufficient documentation

## 2016-06-15 DIAGNOSIS — M79672 Pain in left foot: Secondary | ICD-10-CM | POA: Insufficient documentation

## 2016-06-15 NOTE — Therapy (Signed)
[place], [place], [address] Phone: [phone]   Fax:  [phone]  Wound Care Therapy  Patient Details  Name: Sherri Brooks MRN: [id number] Date of Birth: [birth year] No Data Recorded  Encounter Date: 06/15/2016      PT End of Session - 06/15/16 1440    Visit Number 29   Number of Visits 34   Date for PT Re-Evaluation 06/06/16   Authorization Type UHC medicare   Authorization Time Period Gcodes updated visit 26   Authorization - Visit Number 29   Authorization - Number of Visits 34   PT Start Time 3009   PT Stop Time 1428   PT Time Calculation (min) 35 min   Activity Tolerance Patient tolerated treatment well   Behavior During Therapy WFL for tasks assessed/performed      Past Medical History:  Diagnosis Date  . Arthritis   . CAD (coronary artery disease)   . Cellulitis of buttock, left 2010  . Diabetes mellitus   . GERD (gastroesophageal reflux disease)   . Hyperlipidemia   . Hypertension   . MI (myocardial infarction) ([place])   . PVD (peripheral vascular disease) ([place])   . Wears glasses     Past Surgical History:  Procedure Laterality Date  . ABDOMINAL AORTAGRAM N/A 07/28/2012   Procedure: ABDOMINAL Maxcine Ham;  Surgeon: Angelia Mould, MD;  Location[facility] CATH LAB;  Service: Cardiovascular;  Laterality: N/A;  . COLONOSCOPY  10/26/2011   Procedure: COLONOSCOPY;  Surgeon: Dorothyann Peng, MD;  [facility];  Service: Endoscopy;  Laterality: N/A;  1:30  . FEMORAL-POPLITEAL BYPASS GRAFT  07/2010   pt has had 3 fem-pop bpg  . FEMORAL-POPLITEAL BYPASS GRAFT  11/04/2012   Procedure: BYPASS GRAFT FEMORAL-POPLITEAL ARTERY;  Surgeon: Angelia Mould, MD;  Location[facility] OR;  Service: Vascular;  Laterality: Left;  Redo Left Femoral Popliteal Bypass with PTFE  . FOOT AMPUTATION  2010   left  . LOWER EXTREMITY ANGIOGRAM Bilateral 07/28/2012   Procedure: LOWER EXTREMITY ANGIOGRAM;   Surgeon: Angelia Mould, MD;  Location: [facility] CATH LAB;  Service: Cardiovascular;  Laterality: Bilateral;  . LOWER EXTREMITY ANGIOGRAM Left 01/02/2016   Procedure: Lower Extremity Angiogram;  Surgeon: Angelia Mould, MD;  Location: [place] CV LAB;  Service: Cardiovascular;  Laterality: Left;  . ORIF ANKLE FRACTURE Left 01/27/2013   Procedure: OPEN REDUCTION INTERNAL FIXATION (ORIF) ANKLE FRACTURE;  Surgeon: Wylene Simmer, MD;  Location: [place];  Service: Orthopedics;  Laterality: Left;  . PERCUTANEOUS STENT INTERVENTION Left 07/28/2012   Procedure: PERCUTANEOUS STENT INTERVENTION;  Surgeon: Angelia Mould, MD;  Location: [facility] CATH LAB;  Service: Cardiovascular;  Laterality: Left;  lt ext tiliac stentx1  . PERIPHERAL VASCULAR CATHETERIZATION N/A 01/02/2016   Procedure: Abdominal Aortogram;  Surgeon: Angelia Mould, MD;  Location: [place] CV LAB;  Service: Cardiovascular;  Laterality: N/A;  . PERIPHERAL VASCULAR CATHETERIZATION Left 01/02/2016   Procedure: Peripheral Vascular Intervention;  Surgeon: Angelia Mould, MD;  Location: [place] CV LAB;  Service: Cardiovascular;  Laterality: Left;  lt ext iliac  . right common iliac stent  10/2009   Dr Doren Custard ([place])  . S/P Hysterecotmy     partial  . TONSILLECTOMY      There were no vitals filed for this visit.       Subjective Assessment - 06/15/16 1441    Subjective Pt arrived with dressings intact, no reports of pain this  session.                   Wound Therapy - 06/15/16 1443    Subjective Pt arrived with dressings intact, no reports of pain this session.   Patient and Family Stated Goals wound to heal    Date of Onset 11/05/15   Prior Treatments self care   Pain Assessment No/denies pain   Evaluation and Treatment Procedures Explained to Patient/Family Yes   Evaluation and Treatment Procedures agreed to   Wound Properties Date First Assessed: 01/05/16 Time First Assessed:  1435 Wound Type: Other (Comment) Location: Heel Location Orientation: Left Present on Admission: Yes   Dressing Type Gauze (Comment);Silver hydrofiber;Tape dressing  silver hydrofiber, vaseline perimeter, 2x2, medipore tape; g   Dressing Changed Changed   Dressing Status Clean;Old drainage   Dressing Change Frequency Every 3 days   Site / Wound Assessment Friable;Yellow   % Wound base Red or Granulating 50%   % Wound base Yellow 50%   Undermining (cm) continues to undermine   Margins Epibole (rolled edges)   Closure None   Drainage Amount Minimal   Treatment Cleansed;Debridement (Selective)   Wound Properties Date First Assessed: 11/10/15 Time First Assessed: 1455 Wound Type: Other (Comment) Location: Foot Location Orientation: Left Wound Description (Comments): lateral aspect of transmetatarsal ampuation    Dressing Type Silver hydrofiber;Tape dressing;Gauze (Comment)  silverhydrofiber, vaseline perimeter, 2x2, medipore tape, ga   Dressing Changed Changed   Dressing Status Old drainage   Dressing Change Frequency Every 3 days   Site / Wound Assessment Pink;Red;Yellow   % Wound base Red or Granulating 70%   % Wound base Yellow 30%   Undermining (cm) noted undermining lateral portion 8-11o'clock 0.3cm   Margins Epibole (rolled edges)   Drainage Amount Minimal   Drainage Description Serous   Selective Debridement - Location edges and wound bed    Selective Debridement - Tools Used Forceps;Scissors   Selective Debridement - Tissue Removed biofilm off of metatarsal wound; slough from heel and edipole edges   Wound Therapy - Clinical Statement Noted undermining on lateral aspect (8-11 o'clock) and increased maceration on metatarsal wound, changed dressings to silverhydrofiber Bil wounds to address drainage and maceration.  Medipore tape applied to keep dressings intact with gauze.  No reoprts of increased pain through session.     Wound Therapy - Functional Problem List increased pain with  increased walking .   Factors Delaying/Impairing Wound Healing Altered sensation;Diabetes Mellitus;Multiple medical problems;Polypharmacy;Vascular compromise   Hydrotherapy Plan Debridement;Dressing change   Wound Therapy - Frequency --  2x/week   Wound Therapy - Current Recommendations PT   Wound Plan Continue appropriate wound care to Bil wounds,    Dressing  silverhydrofiber Bil wounds with vaseline perimeter of wound and medipore tape and gauze                   PT Short Term Goals - 06/07/16 1452      PT SHORT TERM GOAL #1   Title Pt necrotic tissue to be 25%   Time 2   Period Weeks   Status Achieved     PT SHORT TERM GOAL #2   Title Pt  granulated tissue to be 75%   Time 2   Period Days   Status Achieved     PT SHORT TERM GOAL #3   Title Pt to verbalize the importance of keeping her leg in a dependent postion   Time 2   Period Weeks  Status Achieved     PT SHORT TERM GOAL #4   Title Pt pain to be no greater than a 4/10   Time 2   Period Weeks   Status Achieved           PT Long Term Goals - 06/07/16 1452      PT LONG TERM GOAL #1   Title Pt necrotic tissue to be 0%   Time 4   Period Weeks   Status On-going  achieved for initial wound; ongoing heel     PT LONG TERM GOAL #2   Title Pt wound to be granulated 100%    Time 4   Period Weeks   Status On-going     PT LONG TERM GOAL #3   Title Pt wound size to be decreased by 1x .25x.2 cm    Baseline 2.2x .7x .3   Time 4   Period Weeks   Status Partially Met     PT LONG TERM GOAL #4   Title ADDed: 3/32/2017:  Began treatment on heel:  Heel to be 100% granulated   Time 6   Period Weeks   Status On-going     PT LONG TERM GOAL #5   Title Heel wound to be .5x.5 with a depth no greater than .5   Baseline wound was 100% grey necrotic tissue opening of 1.2x .9 with a depth of at least 1.5 and undermining throughout wound of .5 cm    Time 6   Period Weeks   Status On-going              Patient will benefit from skilled therapeutic intervention in order to improve the following deficits and impairments:     Visit Diagnosis: 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 Ankle fracture, bimalleolar, closed 12/29/2012  . Anemia of unknown etiology 11/06/2012  . Atherosclerotic PVD with ulceration ([place]) 11/04/2012  . Type II or unspecified type diabetes mellitus with peripheral circulatory disorders, uncontrolled(250.72) 11/04/2012  . Tobacco use disorder 11/04/2012  . Open wound of left foot 11/04/2012  . Atherosclerosis of native arteries of the extremities with ulceration(440.23) 07/09/2012  . Weight loss, unintentional 09/18/2011  . Anorexia 09/18/2011  . Early satiety 09/18/2011  . 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/07/2011   Ihor Austin, LPTA; CBIS [phone]  Aldona Lento 06/15/2016, 4:38 PM  [facility] [address] [place], [place], [address] Phone: [phone]   Fax:  [phone]  Name: Sherri Brooks MRN: [id number] Date of Birth: Jan 31, 1946

## 2016-07-10 DIAGNOSIS — R69 Illness, unspecified: Secondary | ICD-10-CM | POA: Diagnosis not present

## 2016-07-12 DIAGNOSIS — H6121 Impacted cerumen, right ear: Secondary | ICD-10-CM | POA: Diagnosis not present

## 2016-07-12 DIAGNOSIS — J209 Acute bronchitis, unspecified: Secondary | ICD-10-CM | POA: Diagnosis not present

## 2016-07-12 DIAGNOSIS — H109 Unspecified conjunctivitis: Secondary | ICD-10-CM | POA: Diagnosis not present

## 2016-07-12 DIAGNOSIS — R062 Wheezing: Secondary | ICD-10-CM | POA: Diagnosis not present

## 2016-07-12 DIAGNOSIS — J329 Chronic sinusitis, unspecified: Secondary | ICD-10-CM | POA: Diagnosis not present

## 2016-07-12 DIAGNOSIS — R070 Pain in throat: Secondary | ICD-10-CM | POA: Diagnosis not present

## 2016-07-12 DIAGNOSIS — Z6823 Body mass index (BMI) 23.0-23.9, adult: Secondary | ICD-10-CM | POA: Diagnosis not present

## 2016-07-12 DIAGNOSIS — J343 Hypertrophy of nasal turbinates: Secondary | ICD-10-CM | POA: Diagnosis not present

## 2016-08-08 ENCOUNTER — Encounter (HOSPITAL_COMMUNITY): Payer: Medicare HMO

## 2016-08-08 ENCOUNTER — Ambulatory Visit: Payer: Medicare HMO | Admitting: Vascular Surgery

## 2016-08-21 ENCOUNTER — Encounter: Payer: Self-pay | Admitting: Vascular Surgery

## 2016-08-22 ENCOUNTER — Ambulatory Visit (HOSPITAL_COMMUNITY)
Admission: RE | Admit: 2016-08-22 | Discharge: 2016-08-22 | Disposition: A | Discharge status: Home / Self Care | Payer: Medicare HMO | Source: Ambulatory Visit | Attending: Vascular Surgery | Admitting: Vascular Surgery

## 2016-08-22 ENCOUNTER — Encounter: Payer: Self-pay | Admitting: Vascular Surgery

## 2016-08-22 ENCOUNTER — Ambulatory Visit (INDEPENDENT_AMBULATORY_CARE_PROVIDER_SITE_OTHER): Payer: Medicare HMO | Admitting: Vascular Surgery

## 2016-08-22 VITALS — BP 182/70 | HR 69 | Temp 97.3°F | Ht 63.0 in | Wt 157.0 lb

## 2016-08-22 DIAGNOSIS — I739 Peripheral vascular disease, unspecified: Secondary | ICD-10-CM

## 2016-08-22 DIAGNOSIS — I70213 Atherosclerosis of native arteries of extremities with intermittent claudication, bilateral legs: Secondary | ICD-10-CM | POA: Insufficient documentation

## 2016-08-22 NOTE — Progress Notes (Signed)
Patient name: Sherri Brooks MRN: [id number] DOB: 1946-02-07 Sex: female  REASON FOR VISIT: Follow up of peripheral vascular disease.  HPI: Sherri Brooks is a 70 y.o. female who had a nonhealing wound on her left transmetatarsal amputation. She had multiple previous vascular procedures including bilateral iliac stents and 2 bypasses in her left leg. I was concerned about the inflow on the left when I saw her previously and she was set up for an arteriogram and underwent angioplasty and stenting of the left external iliac artery. She had 2 tendons stenoses 70% and 80% which were successfully addressed with angioplasty and stenting with no residual stenosis. She comes in for a follow up visit.  Since I saw her last season doing well. She states that the wound on her left transmetatarsal amputation site has improved significantly. She quit smoking.  She is on aspirin, Plavix, and a statin.  Past Medical History:  Diagnosis Date  . Arthritis   . CAD (coronary artery disease)   . Cellulitis of buttock, left 2010  . Diabetes mellitus   . GERD (gastroesophageal reflux disease)   . Hyperlipidemia   . Hypertension   . MI (myocardial infarction)   . PVD (peripheral vascular disease) ([place])   . Wears glasses     Family History  Problem Relation Age of Onset  . Brain cancer Father   . Cancer Father   . Diabetes Mother     many family members  . Alzheimer's disease Mother   . Heart disease      SOCIAL HISTORY: Social History  Substance Use Topics  . Smoking status: Former Smoker    Packs/day: 0.40    Years: 20.00    Types: Cigarettes    Start date: 11/04/2012    Quit date: 03/11/2016  . Smokeless tobacco: Never Used  . Alcohol use No    Allergies  Allergen Reactions  . Iohexol      Code: HIVES, Desc: PT STATES SHE EXPERIENCED ITCHING W/ IV DYE IN PAST-ARS 01/21/10, Onset Date: [id number]     Current Outpatient Prescriptions  Medication Sig Dispense Refill  . albuterol  (PROVENTIL HFA;VENTOLIN HFA) 108 (90 BASE) MCG/ACT inhaler Inhale 2 puffs into the lungs every 6 (six) hours as needed. For bronchitis and coughing    . alendronate (FOSAMAX) 70 MG tablet Take 70 mg by mouth every 7 (seven) days. On Mondays    . amoxicillin-clavulanate (AUGMENTIN) 875-125 MG tablet Take 1 tablet by mouth every 12 (twelve) hours. 14 tablet 0  . aspirin 325 MG EC tablet Take 325 mg by mouth daily.      . clopidogrel (PLAVIX) 75 MG tablet Take 75 mg by mouth daily.      . hydrochlorothiazide (HYDRODIURIL) 25 MG tablet Take 25 mg by mouth daily.    . insulin glargine (LANTUS) 100 UNIT/ML injection Inject 30 Units into the skin at bedtime.     Marland Kitchen labetalol (NORMODYNE) 200 MG tablet Take 200 mg by mouth 2 (two) times daily.      Marland Kitchen LORazepam (ATIVAN) 0.5 MG tablet Take 0.5 mg by mouth at bedtime.     . Multiple Vitamin (MULITIVITAMIN WITH MINERALS) TABS Take 1 tablet by mouth daily.    Marland Kitchen ofloxacin (FLOXIN) 0.3 % otic solution Place 10 drops into the left ear 2 (two) times daily. For the next 14 days 15 mL 0  . oxyCODONE-acetaminophen (PERCOCET/ROXICET) 5-325 MG tablet Take 1 tablet by mouth every 4 (four) hours as needed for severe  pain. 30 tablet 0  . simvastatin (ZOCOR) 40 MG tablet Take 40 mg by mouth at bedtime.      No current facility-administered medications for this visit.     REVIEW OF SYSTEMS:  '[X]'$  denotes positive finding, '[ ]'$  denotes negative finding Cardiac  Comments:  Chest pain or chest pressure:    Shortness of breath upon exertion:    Short of breath when lying flat:    Irregular heart rhythm:        Vascular    Pain in calf, thigh, or hip brought on by ambulation:    Pain in feet at night that wakes you up from your sleep:  X   Blood clot in your veins:    Leg swelling:         Pulmonary    Oxygen at home:    Productive cough:     Wheezing:         Neurologic    Sudden weakness in arms or legs:     Sudden numbness in arms or legs:     Sudden onset of  difficulty speaking or slurred speech:    Temporary loss of vision in one eye:     Problems with dizziness:  X       Gastrointestinal    Blood in stool:     Vomited blood:         Genitourinary    Burning when urinating:     Blood in urine:        Psychiatric    Major depression:         Hematologic    Bleeding problems:    Problems with blood clotting too easily:        Skin    Rashes or ulcers:        Constitutional    Fever or chills:      PHYSICAL EXAM: Vitals:   08/22/16 1040 08/22/16 1043  BP: (!) 160/72 (!) 182/70  Pulse: 70 69  Temp: 97.3 F (36.3 C)   SpO2: 99%   Weight: 157 lb (71.2 kg)   Height: '5\' 3"'$  (1.6 m)     GENERAL: The patient is a well-nourished female, in no acute distress. The vital signs are documented above. CARDIAC: There is a regular rate and rhythm.  VASCULAR: I do not detect carotid bruits. She has palpable femoral pulses. I cannot palpate pedal pulses. She has no significant lower extremity swelling. PULMONARY: There is good air exchange bilaterally without wheezing or rales. ABDOMEN: Soft and non-tender with normal pitched bowel sounds.  MUSCULOSKELETAL: There are no major deformities or cyanosis. NEUROLOGIC: No focal weakness or paresthesias are detected. SKIN: The wound on her lateral TMA on the left side measures proximally 2 cm in length by 1.5 cm in width. PSYCHIATRIC: The patient has a normal affect.  DATA:   AORTOILIAC DUPLEX: I have independently interpreted her duplex of her aorta and iliac arteries. The left extremity iliac artery stent is widely patent without evidence of stenosis.  BILATERAL ARTERIAL DOPPLER STUDY: I have independently interpreted her arterial Doppler study. This shows monophasic Doppler signals in both feet with an ABI of 42% on the right with a toe pressure of 19 mmHg. ABI on the left is 41%.  MEDICAL ISSUES:  PERIPHERAL VASCULAR DISEASE: The patient is doing well status post left external iliac  artery angioplasty and stenting. She states that the wound on her left foot has improved although she does have significant infrainguinal arterial  occlusive disease and has had multiple previous revascularization attempts with no no good remaining options for revascularization. Fortunately she has quit smoking. She is followed in the wound care center with aggressive wound care. I have ordered a follow up duplex and ABIs in 6 months and I'll see her back at that time. She knows to call sooner if she has problems.  HYPERTENSION: The patient's initial blood pressure today was elevated. We repeated this and this was still elevated. We have encouraged the patient to follow up with their primary care physician for management of their blood pressure.   Deitra Mayo [facility] [phone]

## 2016-09-04 NOTE — Addendum Note (Signed)
Addended by: Lianne Cure A on: 09/04/2016 10:30 AM   Modules accepted: Orders

## 2016-09-19 DIAGNOSIS — R69 Illness, unspecified: Secondary | ICD-10-CM | POA: Diagnosis not present

## 2016-09-27 DIAGNOSIS — E663 Overweight: Secondary | ICD-10-CM | POA: Diagnosis not present

## 2016-09-27 DIAGNOSIS — S98922S Partial traumatic amputation of left foot, level unspecified, sequela: Secondary | ICD-10-CM | POA: Diagnosis not present

## 2016-09-27 DIAGNOSIS — I10 Essential (primary) hypertension: Secondary | ICD-10-CM | POA: Diagnosis not present

## 2016-09-27 DIAGNOSIS — E119 Type 2 diabetes mellitus without complications: Secondary | ICD-10-CM | POA: Diagnosis not present

## 2016-09-27 DIAGNOSIS — I779 Disorder of arteries and arterioles, unspecified: Secondary | ICD-10-CM | POA: Diagnosis not present

## 2016-09-27 DIAGNOSIS — Z6826 Body mass index (BMI) 26.0-26.9, adult: Secondary | ICD-10-CM | POA: Diagnosis not present

## 2016-09-27 DIAGNOSIS — E782 Mixed hyperlipidemia: Secondary | ICD-10-CM | POA: Diagnosis not present

## 2017-01-12 DIAGNOSIS — E1165 Type 2 diabetes mellitus with hyperglycemia: Secondary | ICD-10-CM | POA: Diagnosis not present

## 2017-01-12 DIAGNOSIS — D649 Anemia, unspecified: Secondary | ICD-10-CM | POA: Diagnosis not present

## 2017-01-12 DIAGNOSIS — I10 Essential (primary) hypertension: Secondary | ICD-10-CM | POA: Diagnosis not present

## 2017-01-12 DIAGNOSIS — I509 Heart failure, unspecified: Secondary | ICD-10-CM | POA: Diagnosis not present

## 2017-01-12 DIAGNOSIS — I161 Hypertensive emergency: Secondary | ICD-10-CM | POA: Diagnosis not present

## 2017-01-12 DIAGNOSIS — E785 Hyperlipidemia, unspecified: Secondary | ICD-10-CM | POA: Diagnosis not present

## 2017-01-12 DIAGNOSIS — I5031 Acute diastolic (congestive) heart failure: Secondary | ICD-10-CM | POA: Diagnosis not present

## 2017-01-12 DIAGNOSIS — E1151 Type 2 diabetes mellitus with diabetic peripheral angiopathy without gangrene: Secondary | ICD-10-CM | POA: Diagnosis not present

## 2017-01-12 DIAGNOSIS — Z86718 Personal history of other venous thrombosis and embolism: Secondary | ICD-10-CM | POA: Diagnosis not present

## 2017-01-12 DIAGNOSIS — I340 Nonrheumatic mitral (valve) insufficiency: Secondary | ICD-10-CM | POA: Diagnosis not present

## 2017-01-12 DIAGNOSIS — I248 Other forms of acute ischemic heart disease: Secondary | ICD-10-CM | POA: Diagnosis not present

## 2017-01-12 DIAGNOSIS — I110 Hypertensive heart disease with heart failure: Secondary | ICD-10-CM | POA: Diagnosis not present

## 2017-01-12 DIAGNOSIS — R0602 Shortness of breath: Secondary | ICD-10-CM | POA: Diagnosis not present

## 2017-01-12 DIAGNOSIS — Z91013 Allergy to seafood: Secondary | ICD-10-CM | POA: Diagnosis not present

## 2017-01-12 DIAGNOSIS — I517 Cardiomegaly: Secondary | ICD-10-CM | POA: Diagnosis not present

## 2017-01-28 DIAGNOSIS — E1129 Type 2 diabetes mellitus with other diabetic kidney complication: Secondary | ICD-10-CM | POA: Diagnosis not present

## 2017-01-28 DIAGNOSIS — E663 Overweight: Secondary | ICD-10-CM | POA: Diagnosis not present

## 2017-01-28 DIAGNOSIS — I509 Heart failure, unspecified: Secondary | ICD-10-CM | POA: Diagnosis not present

## 2017-01-28 DIAGNOSIS — Z6826 Body mass index (BMI) 26.0-26.9, adult: Secondary | ICD-10-CM | POA: Diagnosis not present

## 2017-02-14 ENCOUNTER — Other Ambulatory Visit (HOSPITAL_COMMUNITY): Payer: Self-pay | Admitting: Family Medicine

## 2017-02-14 DIAGNOSIS — Z1231 Encounter for screening mammogram for malignant neoplasm of breast: Secondary | ICD-10-CM

## 2017-02-20 ENCOUNTER — Encounter (HOSPITAL_COMMUNITY): Payer: Medicare HMO

## 2017-02-20 ENCOUNTER — Ambulatory Visit: Payer: Medicare HMO | Admitting: Vascular Surgery

## 2017-02-25 ENCOUNTER — Ambulatory Visit (HOSPITAL_COMMUNITY)
Admission: RE | Admit: 2017-02-25 | Discharge: 2017-02-25 | Disposition: A | Discharge status: Home / Self Care | Payer: Medicare HMO | Source: Ambulatory Visit | Attending: Family Medicine | Admitting: Family Medicine

## 2017-02-25 DIAGNOSIS — Z1231 Encounter for screening mammogram for malignant neoplasm of breast: Secondary | ICD-10-CM | POA: Diagnosis not present

## 2017-02-26 ENCOUNTER — Encounter: Payer: Self-pay | Admitting: Vascular Surgery

## 2017-03-06 ENCOUNTER — Encounter: Payer: Self-pay | Admitting: Vascular Surgery

## 2017-03-06 ENCOUNTER — Ambulatory Visit (INDEPENDENT_AMBULATORY_CARE_PROVIDER_SITE_OTHER): Payer: Medicare HMO | Admitting: Vascular Surgery

## 2017-03-06 ENCOUNTER — Ambulatory Visit (HOSPITAL_COMMUNITY)
Admission: RE | Admit: 2017-03-06 | Discharge: 2017-03-06 | Disposition: A | Discharge status: Home / Self Care | Payer: Medicare HMO | Source: Ambulatory Visit | Attending: Vascular Surgery | Admitting: Vascular Surgery

## 2017-03-06 ENCOUNTER — Ambulatory Visit (INDEPENDENT_AMBULATORY_CARE_PROVIDER_SITE_OTHER)
Admission: RE | Admit: 2017-03-06 | Discharge: 2017-03-06 | Disposition: A | Discharge status: Home / Self Care | Payer: Medicare HMO | Source: Ambulatory Visit | Attending: Vascular Surgery | Admitting: Vascular Surgery

## 2017-03-06 VITALS — BP 198/87 | HR 91 | Temp 98.6°F | Resp 16 | Ht 63.0 in | Wt 156.0 lb

## 2017-03-06 DIAGNOSIS — R9439 Abnormal result of other cardiovascular function study: Secondary | ICD-10-CM | POA: Diagnosis not present

## 2017-03-06 DIAGNOSIS — I739 Peripheral vascular disease, unspecified: Secondary | ICD-10-CM

## 2017-03-06 DIAGNOSIS — Z95820 Peripheral vascular angioplasty status with implants and grafts: Secondary | ICD-10-CM | POA: Insufficient documentation

## 2017-03-06 DIAGNOSIS — I771 Stricture of artery: Secondary | ICD-10-CM | POA: Diagnosis not present

## 2017-03-06 NOTE — Progress Notes (Signed)
Patient name: Sherri Brooks MRN: [id number] DOB: [birth year] Sex: female  REASON FOR VISIT:    Follow up of peripheral vascular disease.  HPI:   Sherri Brooks is a 71 y.o. female who I last saw on 08/22/2016. She had presented with a nonhealing wound on her left transmetatarsal amputation site. She has had multiple previous vascular procedures including bilateral iliac stents and 2 bypasses in her left leg. Her most recent procedure was angioplasty and stenting of the left external iliac artery in March 2017. When I saw her last a proximally 6 months ago, her left external iliac artery was widely patent without evidence of stenosis. She had an ABI 42% on the right with a toe pressure of 19 mmHg. ABI on the left was 41%.   Since I saw her last, she denies any claudication although her activity is very limited. She denies any rest pain. She has had a small wound on the lateral aspect of her left transmetatarsal amputation but states that this has been improving.  Her blood pressure has been poorly controlled. She is also had a recent transfusion.  Past Medical History:  Diagnosis Date  . Arthritis   . CAD (coronary artery disease)   . Cellulitis of buttock, left 2010  . Diabetes mellitus   . GERD (gastroesophageal reflux disease)   . Hyperlipidemia   . Hypertension   . MI (myocardial infarction) ([place])   . PVD (peripheral vascular disease) ([place])   . Wears glasses     Family History  Problem Relation Age of Onset  . Brain cancer Father   . Cancer Father   . Diabetes Mother        many family members  . Alzheimer's disease Mother   . Heart disease Unknown     SOCIAL HISTORY: Social History  Substance Use Topics  . Smoking status: Former Smoker    Packs/day: 0.40    Years: 20.00    Types: Cigarettes    Start date: 11/04/2012    Quit date: 03/11/2016  . Smokeless tobacco: Never Used  . Alcohol use No    Allergies  Allergen Reactions  . Iohexol      Code: HIVES, Desc:  PT STATES SHE EXPERIENCED ITCHING W/ IV DYE IN PAST-ARS 01/21/10, Onset Date: [id number]     Current Outpatient Prescriptions  Medication Sig Dispense Refill  . alendronate (FOSAMAX) 70 MG tablet Take 70 mg by mouth every 7 (seven) days. On Mondays    . aspirin 325 MG EC tablet Take 325 mg by mouth daily.      . clopidogrel (PLAVIX) 75 MG tablet Take 75 mg by mouth daily.      . hydrochlorothiazide (HYDRODIURIL) 25 MG tablet Take 25 mg by mouth daily.    . insulin glargine (LANTUS) 100 UNIT/ML injection Inject 30 Units into the skin at bedtime.     . Iron-Vitamins (GERITOL COMPLETE PO) Take by mouth.    . labetalol (NORMODYNE) 200 MG tablet Take 200 mg by mouth 2 (two) times daily.      Marland Kitchen LORazepam (ATIVAN) 0.5 MG tablet Take 0.5 mg by mouth at bedtime.     Marland Kitchen ofloxacin (FLOXIN) 0.3 % otic solution Place 10 drops into the left ear 2 (two) times daily. For the next 14 days 15 mL 0  . Omega-3 Fatty Acids (FISH OIL) 1000 MG CPDR Take by mouth.    . simvastatin (ZOCOR) 40 MG tablet Take 40 mg by mouth at bedtime.     Marland Kitchen  albuterol (PROVENTIL HFA;VENTOLIN HFA) 108 (90 BASE) MCG/ACT inhaler Inhale 2 puffs into the lungs every 6 (six) hours as needed. For bronchitis and coughing     No current facility-administered medications for this visit.     REVIEW OF SYSTEMS:  [X]  denotes positive finding, [ ]  denotes negative finding Cardiac  Comments:  Chest pain or chest pressure:    Shortness of breath upon exertion:    Short of breath when lying flat:    Irregular heart rhythm:        Vascular    Pain in calf, thigh, or hip brought on by ambulation:    Pain in feet at night that wakes you up from your sleep:     Blood clot in your veins:    Leg swelling:         Pulmonary    Oxygen at home:    Productive cough:     Wheezing:  X       Neurologic    Sudden weakness in arms or legs:     Sudden numbness in arms or legs:     Sudden onset of difficulty speaking or slurred speech:    Temporary  loss of vision in one eye:     Problems with dizziness:  X       Gastrointestinal    Blood in stool:     Vomited blood:         Genitourinary    Burning when urinating:     Blood in urine:        Psychiatric    Major depression:         Hematologic    Bleeding problems:    Problems with blood clotting too easily:        Skin    Rashes or ulcers:        Constitutional    Fever or chills:     PHYSICAL EXAM:   Vitals:   03/06/17 0926  BP: (!) 198/87  Pulse: 91  Resp: 16  Temp: 98.6 F (37 C)  TempSrc: Oral  SpO2: 100%  Weight: 156 lb (70.8 kg)  Height: 5\' 3"  (1.6 m)    GENERAL: The patient is a well-nourished female, in no acute distress. The vital signs are documented above. CARDIAC: There is a regular rate and rhythm.  VASCULAR: I do not detect carotid bruits. She does have diminished but palpable femoral pulses. I cannot palpate pedal pulses. She has no significant lower extremity swelling. PULMONARY: There is good air exchange bilaterally without wheezing or rales. ABDOMEN: Soft and non-tender with normal pitched bowel sounds.  MUSCULOSKELETAL: There are no major deformities or cyanosis. NEUROLOGIC: No focal weakness or paresthesias are detected. SKIN: She has a small wound on the lateral aspect of her left transmetatarsal amputation with fairly healthy-appearing granulation tissue. This is about a centimeter in diameter. PSYCHIATRIC: The patient has a normal affect.  DATA:    DUPLEX OF ILIAC ARTERIES: Duplex of her iliac arteries was performed today and I have independently interpreted this study. There are elevated velocities in the proximal stent with monophasic flow throughout. ABI today is 24% which is down from 41% previously.  ARTERIAL DOPPLER: I have independently interpreted her arterial Doppler study today. She has monophasic Doppler signals in both feet with an ABI of 26% on the right and 24% on the left.  MEDICAL ISSUES:   PERIPHERAL VASCULAR  DISEASE: The patient has developed some recurrent stenosis in her external iliac artery stent  on the left. The stent goes fairly low into her common femoral artery. We have discussed arteriography and possible re-intervention although her blood pressures very poorly control and I'm asked her to follow up with her primary care physician to get this under better control before we can proceed with arteriography. I'll plan on seeing her back in 3 months to discuss this further. She knows to call sooner if the wound and the foot is enlarging or her symptoms progress. Currently she has minimal symptoms on the left.   HYPERTENSION: The patient's initial blood pressure today was elevated. We repeated this and this was still elevated. We have encouraged the patient to follow up with their primary care physician for management of their blood pressure.   Deitra Mayo [facility] [place] [phone]

## 2017-04-04 DIAGNOSIS — R69 Illness, unspecified: Secondary | ICD-10-CM | POA: Diagnosis not present

## 2017-05-28 ENCOUNTER — Encounter: Payer: Self-pay | Admitting: Vascular Surgery

## 2017-06-05 ENCOUNTER — Encounter: Payer: Self-pay | Admitting: *Deleted

## 2017-06-05 ENCOUNTER — Other Ambulatory Visit: Payer: Self-pay | Admitting: *Deleted

## 2017-06-05 ENCOUNTER — Ambulatory Visit (INDEPENDENT_AMBULATORY_CARE_PROVIDER_SITE_OTHER): Payer: Medicare HMO | Admitting: Vascular Surgery

## 2017-06-05 ENCOUNTER — Encounter: Payer: Self-pay | Admitting: Vascular Surgery

## 2017-06-05 VITALS — BP 199/77 | HR 100 | Temp 97.1°F | Ht 63.0 in | Wt 165.0 lb

## 2017-06-05 DIAGNOSIS — R0989 Other specified symptoms and signs involving the circulatory and respiratory systems: Secondary | ICD-10-CM | POA: Diagnosis not present

## 2017-06-05 DIAGNOSIS — I10 Essential (primary) hypertension: Secondary | ICD-10-CM | POA: Diagnosis not present

## 2017-06-05 DIAGNOSIS — I739 Peripheral vascular disease, unspecified: Secondary | ICD-10-CM

## 2017-06-05 NOTE — Progress Notes (Signed)
Patient name: Sherri Brooks MRN: [id number] DOB: 17-Sep-1946 Sex: female  REASON FOR VISIT:    Follow up of peripheral vascular disease.  HPI:   Sherri Brooks is a pleasant 71 y.o. female who I last saw on 03/06/2017. She has had multiple previous vascular procedures including bilateral iliac artery stents, and 2 bypasses in her left leg. Her most recent procedure was done in March 2017 when she underwent angioplasty and stenting of the left external iliac artery. At the time of her last visit, she had a small wound on the lateral aspect of her left transmetatarsal amputation but stated that this was improving. Duplex scan at the time of her last visit showed elevated velocities in the proximal stent of her left external iliac artery and ABI was 24% which had decreased compared to 41% previously. Her blood pressure was very poorly controlled at the time of her last visit and I asked her to follow up with her primary care physician so that we could get this under better control before proceeding with arteriography.  Since I saw her last she is continuing to have persistent pain in her left foot. She denies rest pain in the foot but has pain when she is ambulating. She denies fever or chills. The wound on the lateral aspect of her left transmetatarsal amputation remains open but she denies any drainage from this.  She denies any history of stroke, TIAs, expressive or receptive aphasia, or amaurosis fugax.  PSH:  2011: She had a left femoral to below knee popliteal artery bypass with PTFE. At that time she had transmetatarsal amputation for gangrenous wound of the right foot. The saphenous vein was explored at that time and was not adequate for a bypass conduit.  11/04/2012: The patient had a redo left femoral to below-knee popliteal artery bypass with a 6 mm PTFE graft. The old graft was chronically occluded.   Past Medical History:  Diagnosis Date  . Arthritis   . CAD (coronary artery  disease)   . Cellulitis of buttock, left 2010  . Diabetes mellitus   . GERD (gastroesophageal reflux disease)   . Hyperlipidemia   . Hypertension   . MI (myocardial infarction) ([place])   . PVD (peripheral vascular disease) ([place])   . Wears glasses     Family History  Problem Relation Age of Onset  . Brain cancer Father   . Cancer Father   . Diabetes Mother        many family members  . Alzheimer's disease Mother   . Heart disease Unknown     SOCIAL HISTORY: Social History  Substance Use Topics  . Smoking status: Former Smoker    Packs/day: 0.40    Years: 20.00    Types: Cigarettes    Start date: 11/04/2012    Quit date: 03/11/2016  . Smokeless tobacco: Never Used  . Alcohol use No    Allergies  Allergen Reactions  . Iohexol      Code: HIVES, Desc: PT STATES SHE EXPERIENCED ITCHING W/ IV DYE IN PAST-ARS 01/21/10, Onset Date: [id number]     Current Outpatient Prescriptions  Medication Sig Dispense Refill  . albuterol (PROVENTIL HFA;VENTOLIN HFA) 108 (90 BASE) MCG/ACT inhaler Inhale 2 puffs into the lungs every 6 (six) hours as needed. For bronchitis and coughing    . alendronate (FOSAMAX) 70 MG tablet Take 70 mg by mouth every 7 (seven) days. On Mondays    . aspirin 325 MG EC tablet Take 325 mg  by mouth daily.      . clopidogrel (PLAVIX) 75 MG tablet Take 75 mg by mouth daily.      . hydrochlorothiazide (HYDRODIURIL) 25 MG tablet Take 25 mg by mouth daily.    . insulin glargine (LANTUS) 100 UNIT/ML injection Inject 30 Units into the skin at bedtime.     . Iron-Vitamins (GERITOL COMPLETE PO) Take by mouth.    . labetalol (NORMODYNE) 200 MG tablet Take 200 mg by mouth 2 (two) times daily.      Marland Kitchen LORazepam (ATIVAN) 0.5 MG tablet Take 0.5 mg by mouth at bedtime.     Marland Kitchen ofloxacin (FLOXIN) 0.3 % otic solution Place 10 drops into the left ear 2 (two) times daily. For the next 14 days 15 mL 0  . Omega-3 Fatty Acids (FISH OIL) 1000 MG CPDR Take by mouth.    . simvastatin (ZOCOR) 40  MG tablet Take 40 mg by mouth at bedtime.      No current facility-administered medications for this visit.     REVIEW OF SYSTEMS:  [X]  denotes positive finding, [ ]  denotes negative finding Cardiac  Comments:  Chest pain or chest pressure:    Shortness of breath upon exertion:    Short of breath when lying flat:    Irregular heart rhythm:        Vascular    Pain in calf, thigh, or hip brought on by ambulation: X   Pain in feet at night that wakes you up from your sleep:     Blood clot in your veins:    Leg swelling:         Pulmonary    Oxygen at home:    Productive cough:     Wheezing:         Neurologic    Sudden weakness in arms or legs:     Sudden numbness in arms or legs:     Sudden onset of difficulty speaking or slurred speech:    Temporary loss of vision in one eye:     Problems with dizziness:         Gastrointestinal    Blood in stool:     Vomited blood:         Genitourinary    Burning when urinating:     Blood in urine:        Psychiatric    Major depression:         Hematologic    Bleeding problems:    Problems with blood clotting too easily:        Skin    Rashes or ulcers: X Lateral aspect of left transmetatarsal amputation       Constitutional    Fever or chills:     PHYSICAL EXAM:   Vitals:   06/05/17 1044  BP: (!) 221/83  Pulse: 100  Temp: (!) 97.1 F (36.2 C)  TempSrc: Oral  SpO2: 100%  Weight: 165 lb (74.8 kg)  Height: 5\' 3"  (1.6 m)    GENERAL: The patient is a well-nourished female, in no acute distress. The vital signs are documented above. CARDIAC: There is a regular rate and rhythm.  VASCULAR: She has a left carotid bruit. She has diminished but palpable femoral pulses. I cannot palpate pedal pulses. She has no significant lower extremity swelling. PULMONARY: There is good air exchange bilaterally without wheezing or rales. ABDOMEN: Soft and non-tender with normal pitched bowel sounds.  MUSCULOSKELETAL: She has a left  transmetatarsal amputation. NEUROLOGIC: No  focal weakness or paresthesias are detected. SKIN: She has a wound on the lateral aspect of her left transmetatarsal amputation which measures 1 cm x 2 cm. There is no erythema or drainage. PSYCHIATRIC: The patient has a normal affect.  DATA:    DUPLEX OF LEFT ILIAC ARTERY: Duplex scan on 03/06/2017 shows stenoses in both common iliac arteries and also the proximal aspect of the left external iliac artery stent.  MEDICAL ISSUES:   PERIPHERAL VASCULAR DISEASE: This patient has worsening pain in the left foot which I think is from ischemia. The wound is not improving and may be larger. I think she has critical limb ischemia and is at risk for limb loss given her severe multilevel arterial occlusive disease and a nonhealing wound on her left foot. I have recommended that we proceed with arteriography in order to determine if there are any further options for revascularization. We have discussed the indications for the procedure and the risks, including, but not limited to bleeding, arterial injury, renal failure, with a need for further surgery. I think without revascularization the wound on the left transmetatarsal amputation site will not heal and she would ultimately require a higher amputation. If she is not a candidate for an endovascular approach she might potentially be a can of her surgery although she's Selena Batten had 2 bypasses in the left leg and I do not think she has vein. If she did require open surgery certainly she would car preoperative cardiac evaluation given her cardiac history. Her arteriogram is scheduled for 06/10/2017. I'll make further recommendations pending these results.  HYPERTENSION: Her blood pressure in the office today was poorly controlled however she did not take her blood pressure medicine this morning. She will follow up with her primary care physician on this.  LEFT CAROTID BRUIT: She does have a left carotid bruit. This is a new  finding. I am unable to locate any other carotid duplex cancel I will arrange for her to get a carotid duplex scan when she returns to the office after her arteriogram. She is asymptomatic. She is on aspirin and is on Plavix and a statin.  Deitra Mayo [facility] [phone]

## 2017-06-10 ENCOUNTER — Inpatient Hospital Stay (HOSPITAL_COMMUNITY)
Admission: AD | Admit: 2017-06-10 | Discharge: 2017-06-15 | DRG: 166 | Disposition: A | Discharge status: Home / Self Care | Payer: Medicare HMO | Source: Ambulatory Visit | Attending: Family Medicine | Admitting: Family Medicine

## 2017-06-10 ENCOUNTER — Ambulatory Visit (HOSPITAL_COMMUNITY): Payer: Medicare HMO

## 2017-06-10 ENCOUNTER — Telehealth: Payer: Self-pay | Admitting: Vascular Surgery

## 2017-06-10 ENCOUNTER — Encounter (HOSPITAL_COMMUNITY): Payer: Self-pay | Admitting: Internal Medicine

## 2017-06-10 ENCOUNTER — Encounter (HOSPITAL_COMMUNITY)
Admission: AD | Disposition: A | Discharge status: Home / Self Care | Payer: Self-pay | Source: Ambulatory Visit | Attending: Family Medicine

## 2017-06-10 DIAGNOSIS — I249 Acute ischemic heart disease, unspecified: Secondary | ICD-10-CM | POA: Diagnosis present

## 2017-06-10 DIAGNOSIS — I248 Other forms of acute ischemic heart disease: Secondary | ICD-10-CM | POA: Diagnosis present

## 2017-06-10 DIAGNOSIS — E118 Type 2 diabetes mellitus with unspecified complications: Secondary | ICD-10-CM

## 2017-06-10 DIAGNOSIS — I252 Old myocardial infarction: Secondary | ICD-10-CM | POA: Diagnosis not present

## 2017-06-10 DIAGNOSIS — I340 Nonrheumatic mitral (valve) insufficiency: Secondary | ICD-10-CM | POA: Diagnosis not present

## 2017-06-10 DIAGNOSIS — E8770 Fluid overload, unspecified: Secondary | ICD-10-CM | POA: Diagnosis present

## 2017-06-10 DIAGNOSIS — E876 Hypokalemia: Secondary | ICD-10-CM | POA: Diagnosis present

## 2017-06-10 DIAGNOSIS — Z7902 Long term (current) use of antithrombotics/antiplatelets: Secondary | ICD-10-CM

## 2017-06-10 DIAGNOSIS — Z794 Long term (current) use of insulin: Secondary | ICD-10-CM | POA: Diagnosis not present

## 2017-06-10 DIAGNOSIS — R0989 Other specified symptoms and signs involving the circulatory and respiratory systems: Secondary | ICD-10-CM | POA: Diagnosis not present

## 2017-06-10 DIAGNOSIS — Z9861 Coronary angioplasty status: Secondary | ICD-10-CM

## 2017-06-10 DIAGNOSIS — I10 Essential (primary) hypertension: Secondary | ICD-10-CM | POA: Diagnosis not present

## 2017-06-10 DIAGNOSIS — I130 Hypertensive heart and chronic kidney disease with heart failure and stage 1 through stage 4 chronic kidney disease, or unspecified chronic kidney disease: Secondary | ICD-10-CM | POA: Diagnosis not present

## 2017-06-10 DIAGNOSIS — N183 Chronic kidney disease, stage 3 (moderate): Secondary | ICD-10-CM | POA: Diagnosis not present

## 2017-06-10 DIAGNOSIS — Z89432 Acquired absence of left foot: Secondary | ICD-10-CM

## 2017-06-10 DIAGNOSIS — F17200 Nicotine dependence, unspecified, uncomplicated: Secondary | ICD-10-CM | POA: Diagnosis present

## 2017-06-10 DIAGNOSIS — D649 Anemia, unspecified: Secondary | ICD-10-CM | POA: Diagnosis not present

## 2017-06-10 DIAGNOSIS — D509 Iron deficiency anemia, unspecified: Secondary | ICD-10-CM | POA: Diagnosis present

## 2017-06-10 DIAGNOSIS — I739 Peripheral vascular disease, unspecified: Secondary | ICD-10-CM | POA: Diagnosis not present

## 2017-06-10 DIAGNOSIS — I2510 Atherosclerotic heart disease of native coronary artery without angina pectoris: Secondary | ICD-10-CM | POA: Diagnosis present

## 2017-06-10 DIAGNOSIS — I5031 Acute diastolic (congestive) heart failure: Secondary | ICD-10-CM | POA: Diagnosis present

## 2017-06-10 DIAGNOSIS — Z87891 Personal history of nicotine dependence: Secondary | ICD-10-CM | POA: Diagnosis not present

## 2017-06-10 DIAGNOSIS — N179 Acute kidney failure, unspecified: Secondary | ICD-10-CM | POA: Diagnosis not present

## 2017-06-10 DIAGNOSIS — R0602 Shortness of breath: Secondary | ICD-10-CM | POA: Diagnosis not present

## 2017-06-10 DIAGNOSIS — E1122 Type 2 diabetes mellitus with diabetic chronic kidney disease: Secondary | ICD-10-CM | POA: Diagnosis present

## 2017-06-10 DIAGNOSIS — I214 Non-ST elevation (NSTEMI) myocardial infarction: Secondary | ICD-10-CM | POA: Diagnosis not present

## 2017-06-10 DIAGNOSIS — J441 Chronic obstructive pulmonary disease with (acute) exacerbation: Secondary | ICD-10-CM | POA: Diagnosis not present

## 2017-06-10 DIAGNOSIS — E119 Type 2 diabetes mellitus without complications: Secondary | ICD-10-CM

## 2017-06-10 DIAGNOSIS — I7025 Atherosclerosis of native arteries of other extremities with ulceration: Secondary | ICD-10-CM | POA: Diagnosis not present

## 2017-06-10 DIAGNOSIS — R69 Illness, unspecified: Secondary | ICD-10-CM | POA: Diagnosis not present

## 2017-06-10 DIAGNOSIS — E785 Hyperlipidemia, unspecified: Secondary | ICD-10-CM | POA: Diagnosis not present

## 2017-06-10 DIAGNOSIS — Z7982 Long term (current) use of aspirin: Secondary | ICD-10-CM

## 2017-06-10 DIAGNOSIS — R0609 Other forms of dyspnea: Secondary | ICD-10-CM | POA: Diagnosis not present

## 2017-06-10 DIAGNOSIS — Z79899 Other long term (current) drug therapy: Secondary | ICD-10-CM

## 2017-06-10 DIAGNOSIS — E1151 Type 2 diabetes mellitus with diabetic peripheral angiopathy without gangrene: Secondary | ICD-10-CM | POA: Diagnosis present

## 2017-06-10 DIAGNOSIS — K219 Gastro-esophageal reflux disease without esophagitis: Secondary | ICD-10-CM | POA: Diagnosis present

## 2017-06-10 DIAGNOSIS — R0600 Dyspnea, unspecified: Secondary | ICD-10-CM | POA: Diagnosis not present

## 2017-06-10 DIAGNOSIS — Z91041 Radiographic dye allergy status: Secondary | ICD-10-CM

## 2017-06-10 HISTORY — PX: ABDOMINAL AORTOGRAM W/LOWER EXTREMITY: CATH118223

## 2017-06-10 HISTORY — PX: PERIPHERAL VASCULAR BALLOON ANGIOPLASTY: CATH118281

## 2017-06-10 LAB — COMPREHENSIVE METABOLIC PANEL
ALT: 18 U/L (ref 14–54)
ANION GAP: 11 (ref 5–15)
AST: 32 U/L (ref 15–41)
Albumin: 3.3 g/dL — ABNORMAL LOW (ref 3.5–5.0)
Alkaline Phosphatase: 67 U/L (ref 38–126)
BUN: 24 mg/dL — ABNORMAL HIGH (ref 6–20)
CHLORIDE: 107 mmol/L (ref 101–111)
CO2: 19 mmol/L — AB (ref 22–32)
CREATININE: 1.56 mg/dL — AB (ref 0.44–1.00)
Calcium: 8.9 mg/dL (ref 8.9–10.3)
GFR calc non Af Amer: 32 mL/min — ABNORMAL LOW (ref >60)
GFR, EST AFRICAN AMERICAN: 37 mL/min — AB (ref >60)
Glucose, Bld: 188 mg/dL — ABNORMAL HIGH (ref 65–99)
Potassium: 3.4 mmol/L — ABNORMAL LOW (ref 3.5–5.1)
SODIUM: 137 mmol/L (ref 135–145)
Total Bilirubin: 0.9 mg/dL (ref 0.3–1.2)
Total Protein: 7.7 g/dL (ref 6.5–8.1)

## 2017-06-10 LAB — HEMOGLOBIN A1C
HEMOGLOBIN A1C: 6.2 % — AB (ref 4.8–5.6)
MEAN PLASMA GLUCOSE: 131.24 mg/dL

## 2017-06-10 LAB — POCT ACTIVATED CLOTTING TIME
ACTIVATED CLOTTING TIME: 169 s
Activated Clotting Time: 202 seconds
Activated Clotting Time: 224 seconds

## 2017-06-10 LAB — BRAIN NATRIURETIC PEPTIDE: B NATRIURETIC PEPTIDE 5: 462.3 pg/mL — AB (ref 0.0–100.0)

## 2017-06-10 LAB — POCT I-STAT, CHEM 8
BUN: 20 mg/dL (ref 6–20)
CALCIUM ION: 1.26 mmol/L (ref 1.15–1.40)
CHLORIDE: 109 mmol/L (ref 101–111)
Creatinine, Ser: 1.4 mg/dL — ABNORMAL HIGH (ref 0.44–1.00)
Glucose, Bld: 147 mg/dL — ABNORMAL HIGH (ref 65–99)
HEMATOCRIT: 22 % — AB (ref 36.0–46.0)
Hemoglobin: 7.5 g/dL — ABNORMAL LOW (ref 12.0–15.0)
Potassium: 3.9 mmol/L (ref 3.5–5.1)
SODIUM: 142 mmol/L (ref 135–145)
TCO2: 20 mmol/L — ABNORMAL LOW (ref 22–32)

## 2017-06-10 LAB — CBC
HCT: 25.1 % — ABNORMAL LOW (ref 36.0–46.0)
Hemoglobin: 7.8 g/dL — ABNORMAL LOW (ref 12.0–15.0)
MCH: 23.9 pg — AB (ref 26.0–34.0)
MCHC: 31.1 g/dL (ref 30.0–36.0)
MCV: 77 fL — AB (ref 78.0–100.0)
PLATELETS: 376 10*3/uL (ref 150–400)
RBC: 3.26 MIL/uL — AB (ref 3.87–5.11)
RDW: 15.6 % — AB (ref 11.5–15.5)
WBC: 8.3 10*3/uL (ref 4.0–10.5)

## 2017-06-10 LAB — GLUCOSE, CAPILLARY
GLUCOSE-CAPILLARY: 189 mg/dL — AB (ref 65–99)
GLUCOSE-CAPILLARY: 205 mg/dL — AB (ref 65–99)
Glucose-Capillary: 286 mg/dL — ABNORMAL HIGH (ref 65–99)

## 2017-06-10 LAB — IRON AND TIBC
IRON: 18 ug/dL — AB (ref 28–170)
Saturation Ratios: 3 % — ABNORMAL LOW (ref 10.4–31.8)
TIBC: 550 ug/dL — AB (ref 250–450)
UIBC: 532 ug/dL

## 2017-06-10 LAB — VITAMIN B12: VITAMIN B 12: 757 pg/mL (ref 180–914)

## 2017-06-10 LAB — RETICULOCYTES
RBC.: 3.29 MIL/uL — ABNORMAL LOW (ref 3.87–5.11)
RETIC COUNT ABSOLUTE: 82.3 10*3/uL (ref 19.0–186.0)
Retic Ct Pct: 2.5 % (ref 0.4–3.1)

## 2017-06-10 LAB — FOLATE: Folate: 28 ng/mL (ref >5.9)

## 2017-06-10 LAB — TROPONIN I: TROPONIN I: 0.13 ng/mL — AB (ref <0.03)

## 2017-06-10 LAB — PREPARE RBC (CROSSMATCH)

## 2017-06-10 LAB — FERRITIN: FERRITIN: 12 ng/mL (ref 11–307)

## 2017-06-10 SURGERY — ABDOMINAL AORTOGRAM W/LOWER EXTREMITY
Anesthesia: LOCAL

## 2017-06-10 MED ORDER — HYDRALAZINE HCL 20 MG/ML IJ SOLN
INTRAMUSCULAR | Status: DC | PRN
  Administered 2017-06-10 (×2): 10 mg via INTRAVENOUS

## 2017-06-10 MED ORDER — ONDANSETRON HCL 4 MG PO TABS: 4.0000 mg | ORAL_TABLET | Freq: Four times a day (QID) | ORAL | Status: DC | PRN

## 2017-06-10 MED ORDER — MIDAZOLAM HCL 2 MG/2ML IJ SOLN
INTRAMUSCULAR | Status: AC
  Filled 2017-06-10: qty 2

## 2017-06-10 MED ORDER — SODIUM CHLORIDE 0.9 % IV SOLN: Freq: Once | INTRAVENOUS | Status: AC

## 2017-06-10 MED ORDER — FAMOTIDINE IN NACL 20-0.9 MG/50ML-% IV SOLN
20.0000 mg | INTRAVENOUS | Status: AC
  Administered 2017-06-10: 20 mg via INTRAVENOUS

## 2017-06-10 MED ORDER — FENTANYL CITRATE (PF) 100 MCG/2ML IJ SOLN
INTRAMUSCULAR | Status: DC | PRN
  Administered 2017-06-10: 50 ug via INTRAVENOUS

## 2017-06-10 MED ORDER — HEPARIN SODIUM (PORCINE) 1000 UNIT/ML IJ SOLN
INTRAMUSCULAR | Status: DC | PRN
  Administered 2017-06-10 (×2): 3000 [IU] via INTRAVENOUS

## 2017-06-10 MED ORDER — DIPHENHYDRAMINE HCL 50 MG/ML IJ SOLN
INTRAMUSCULAR | Status: AC
  Administered 2017-06-10: 25 mg via INTRAVENOUS
  Filled 2017-06-10: qty 1

## 2017-06-10 MED ORDER — ALBUTEROL SULFATE (2.5 MG/3ML) 0.083% IN NEBU
2.5000 mg | INHALATION_SOLUTION | Freq: Three times a day (TID) | RESPIRATORY_TRACT | Status: DC
  Administered 2017-06-11: 2.5 mg via RESPIRATORY_TRACT
  Filled 2017-06-10: qty 3

## 2017-06-10 MED ORDER — FUROSEMIDE 10 MG/ML IJ SOLN
INTRAMUSCULAR | Status: DC | PRN
  Administered 2017-06-10: 40 mg via INTRAVENOUS

## 2017-06-10 MED ORDER — METHYLPREDNISOLONE SODIUM SUCC 125 MG IJ SOLR
125.0000 mg | INTRAMUSCULAR | Status: AC
  Administered 2017-06-10: 125 mg via INTRAVENOUS

## 2017-06-10 MED ORDER — MIDAZOLAM HCL 2 MG/2ML IJ SOLN
INTRAMUSCULAR | Status: DC | PRN
  Administered 2017-06-10: 1 mg via INTRAVENOUS

## 2017-06-10 MED ORDER — SIMVASTATIN 40 MG PO TABS
40.0000 mg | ORAL_TABLET | Freq: Every day | ORAL | Status: DC
  Administered 2017-06-10 – 2017-06-13 (×4): 40 mg via ORAL
  Filled 2017-06-10 (×4): qty 1

## 2017-06-10 MED ORDER — SODIUM CHLORIDE 0.9 % IV SOLN: 250.0000 mL | INTRAVENOUS | Status: DC | PRN

## 2017-06-10 MED ORDER — HYDRALAZINE HCL 20 MG/ML IJ SOLN
INTRAMUSCULAR | Status: AC
  Filled 2017-06-10: qty 1

## 2017-06-10 MED ORDER — DIPHENHYDRAMINE HCL 50 MG/ML IJ SOLN
25.0000 mg | INTRAMUSCULAR | Status: AC
  Administered 2017-06-10: 25 mg via INTRAVENOUS

## 2017-06-10 MED ORDER — LIDOCAINE HCL (PF) 1 % IJ SOLN
INTRAMUSCULAR | Status: AC
  Filled 2017-06-10: qty 30

## 2017-06-10 MED ORDER — IPRATROPIUM-ALBUTEROL 0.5-2.5 (3) MG/3ML IN SOLN
3.0000 mL | Freq: Four times a day (QID) | RESPIRATORY_TRACT | Status: DC | PRN
  Filled 2017-06-10: qty 3

## 2017-06-10 MED ORDER — ALBUTEROL SULFATE (2.5 MG/3ML) 0.083% IN NEBU: 2.5000 mg | INHALATION_SOLUTION | RESPIRATORY_TRACT | Status: DC | PRN

## 2017-06-10 MED ORDER — LIDOCAINE HCL (PF) 1 % IJ SOLN
INTRAMUSCULAR | Status: DC | PRN
  Administered 2017-06-10: 15 mL

## 2017-06-10 MED ORDER — HEPARIN SODIUM (PORCINE) 1000 UNIT/ML IJ SOLN
INTRAMUSCULAR | Status: AC
  Filled 2017-06-10: qty 1

## 2017-06-10 MED ORDER — HYDRALAZINE HCL 20 MG/ML IJ SOLN: 5.0000 mg | INTRAMUSCULAR | Status: DC | PRN

## 2017-06-10 MED ORDER — LABETALOL HCL 200 MG PO TABS
200.0000 mg | ORAL_TABLET | Freq: Two times a day (BID) | ORAL | Status: DC
  Administered 2017-06-10 – 2017-06-11 (×3): 200 mg via ORAL
  Filled 2017-06-10 (×3): qty 1

## 2017-06-10 MED ORDER — ORAL CARE MOUTH RINSE
15.0000 mL | Freq: Two times a day (BID) | OROMUCOSAL | Status: DC
Start: 2017-06-10 — End: 2017-06-15
  Administered 2017-06-11 – 2017-06-15 (×6): 15 mL via OROMUCOSAL

## 2017-06-10 MED ORDER — LABETALOL HCL 5 MG/ML IV SOLN
10.0000 mg | INTRAVENOUS | Status: DC | PRN
  Administered 2017-06-12: 10 mg via INTRAVENOUS
  Filled 2017-06-10 (×2): qty 4

## 2017-06-10 MED ORDER — HYDROCHLOROTHIAZIDE 25 MG PO TABS
25.0000 mg | ORAL_TABLET | Freq: Every day | ORAL | Status: DC
  Administered 2017-06-10 – 2017-06-11 (×2): 25 mg via ORAL
  Filled 2017-06-10 (×2): qty 1

## 2017-06-10 MED ORDER — IPRATROPIUM-ALBUTEROL 0.5-2.5 (3) MG/3ML IN SOLN
RESPIRATORY_TRACT | Status: AC
  Filled 2017-06-10: qty 3

## 2017-06-10 MED ORDER — FENTANYL CITRATE (PF) 100 MCG/2ML IJ SOLN
INTRAMUSCULAR | Status: AC
  Filled 2017-06-10: qty 2

## 2017-06-10 MED ORDER — IODIXANOL 320 MG/ML IV SOLN
INTRAVENOUS | Status: DC | PRN
  Administered 2017-06-10: 125 mL via INTRA_ARTERIAL

## 2017-06-10 MED ORDER — HEPARIN (PORCINE) IN NACL 2-0.9 UNIT/ML-% IJ SOLN
INTRAMUSCULAR | Status: AC | PRN
  Administered 2017-06-10: 1000 mL via INTRA_ARTERIAL

## 2017-06-10 MED ORDER — LABETALOL HCL 5 MG/ML IV SOLN
INTRAVENOUS | Status: AC
  Filled 2017-06-10: qty 4

## 2017-06-10 MED ORDER — SODIUM CHLORIDE 0.9% FLUSH: 3.0000 mL | INTRAVENOUS | Status: DC | PRN

## 2017-06-10 MED ORDER — SODIUM CHLORIDE 0.9 % IV SOLN
INTRAVENOUS | Status: DC
Start: 2017-06-10 — End: 2017-06-10
  Administered 2017-06-10: 07:00:00 via INTRAVENOUS

## 2017-06-10 MED ORDER — BISACODYL 5 MG PO TBEC: 5.0000 mg | DELAYED_RELEASE_TABLET | Freq: Every day | ORAL | Status: DC | PRN

## 2017-06-10 MED ORDER — ALENDRONATE SODIUM 70 MG PO TABS: 70.0000 mg | ORAL_TABLET | ORAL | Status: DC

## 2017-06-10 MED ORDER — METHYLPREDNISOLONE SODIUM SUCC 125 MG IJ SOLR
INTRAMUSCULAR | Status: AC
  Administered 2017-06-10: 125 mg via INTRAVENOUS
  Filled 2017-06-10: qty 2

## 2017-06-10 MED ORDER — INSULIN ASPART 100 UNIT/ML ~~LOC~~ SOLN
0.0000 [IU] | Freq: Three times a day (TID) | SUBCUTANEOUS | Status: DC
  Administered 2017-06-10: 3 [IU] via SUBCUTANEOUS
  Administered 2017-06-11: 5 [IU] via SUBCUTANEOUS
  Administered 2017-06-11 – 2017-06-12 (×4): 3 [IU] via SUBCUTANEOUS
  Administered 2017-06-12: 1 [IU] via SUBCUTANEOUS
  Administered 2017-06-13: 5 [IU] via SUBCUTANEOUS
  Administered 2017-06-13: 2 [IU] via SUBCUTANEOUS
  Administered 2017-06-14: 1 [IU] via SUBCUTANEOUS
  Administered 2017-06-14: 20:00:00 3 [IU] via SUBCUTANEOUS
  Administered 2017-06-15 (×2): 5 [IU] via SUBCUTANEOUS

## 2017-06-10 MED ORDER — IPRATROPIUM-ALBUTEROL 0.5-2.5 (3) MG/3ML IN SOLN
3.0000 mL | Freq: Four times a day (QID) | RESPIRATORY_TRACT | Status: DC
  Administered 2017-06-10: 3 mL via RESPIRATORY_TRACT

## 2017-06-10 MED ORDER — INSULIN ASPART 100 UNIT/ML ~~LOC~~ SOLN
0.0000 [IU] | Freq: Every day | SUBCUTANEOUS | Status: DC
  Administered 2017-06-10: 3 [IU] via SUBCUTANEOUS
  Administered 2017-06-11: 2 [IU] via SUBCUTANEOUS

## 2017-06-10 MED ORDER — SODIUM CHLORIDE 0.9% FLUSH
3.0000 mL | Freq: Two times a day (BID) | INTRAVENOUS | Status: DC
  Administered 2017-06-10 – 2017-06-13 (×7): 3 mL via INTRAVENOUS

## 2017-06-10 MED ORDER — HYDRALAZINE HCL 20 MG/ML IJ SOLN
10.0000 mg | INTRAMUSCULAR | Status: DC | PRN
  Administered 2017-06-10: 10 mg via INTRAVENOUS
  Filled 2017-06-10: qty 1

## 2017-06-10 MED ORDER — ONDANSETRON HCL 4 MG/2ML IJ SOLN: 4.0000 mg | Freq: Four times a day (QID) | INTRAMUSCULAR | Status: DC | PRN

## 2017-06-10 MED ORDER — LABETALOL HCL 5 MG/ML IV SOLN
10.0000 mg | Freq: Once | INTRAVENOUS | Status: AC
  Administered 2017-06-10: 10 mg via INTRAVENOUS

## 2017-06-10 MED ORDER — TRAZODONE HCL 50 MG PO TABS
25.0000 mg | ORAL_TABLET | Freq: Every evening | ORAL | Status: DC | PRN
  Filled 2017-06-10: qty 1

## 2017-06-10 MED ORDER — FAMOTIDINE IN NACL 20-0.9 MG/50ML-% IV SOLN
INTRAVENOUS | Status: AC
  Administered 2017-06-10: 20 mg via INTRAVENOUS
  Filled 2017-06-10: qty 50

## 2017-06-10 MED ORDER — ACETAMINOPHEN 650 MG RE SUPP
650.0000 mg | Freq: Four times a day (QID) | RECTAL | Status: DC | PRN
Start: 2017-06-10 — End: 2017-06-15

## 2017-06-10 MED ORDER — FUROSEMIDE 10 MG/ML IJ SOLN
INTRAMUSCULAR | Status: AC
  Filled 2017-06-10: qty 4

## 2017-06-10 MED ORDER — LORAZEPAM 0.5 MG PO TABS
0.5000 mg | ORAL_TABLET | Freq: Every day | ORAL | Status: DC
  Administered 2017-06-10 – 2017-06-14 (×5): 0.5 mg via ORAL
  Filled 2017-06-10 (×5): qty 1

## 2017-06-10 MED ORDER — CLOPIDOGREL BISULFATE 75 MG PO TABS
75.0000 mg | ORAL_TABLET | Freq: Every day | ORAL | Status: DC
  Administered 2017-06-11 – 2017-06-15 (×5): 75 mg via ORAL
  Filled 2017-06-10 (×5): qty 1

## 2017-06-10 MED ORDER — SODIUM CHLORIDE 0.9 % WEIGHT BASED INFUSION: 1.0000 mL/kg/h | INTRAVENOUS | Status: AC

## 2017-06-10 MED ORDER — ACETAMINOPHEN 325 MG PO TABS
650.0000 mg | ORAL_TABLET | Freq: Four times a day (QID) | ORAL | Status: DC | PRN
Start: 2017-06-10 — End: 2017-06-15

## 2017-06-10 MED ORDER — HYDROCODONE-ACETAMINOPHEN 5-325 MG PO TABS: 1.0000 | ORAL_TABLET | Freq: Four times a day (QID) | ORAL | Status: DC | PRN

## 2017-06-10 MED ORDER — ALBUTEROL SULFATE (2.5 MG/3ML) 0.083% IN NEBU
2.5000 mg | INHALATION_SOLUTION | RESPIRATORY_TRACT | Status: DC
  Administered 2017-06-10: 2.5 mg via RESPIRATORY_TRACT
  Filled 2017-06-10: qty 3

## 2017-06-10 MED ORDER — LABETALOL HCL 5 MG/ML IV SOLN: 10.0000 mg | Freq: Once | INTRAVENOUS | Status: DC

## 2017-06-10 MED ORDER — HEPARIN (PORCINE) IN NACL 2-0.9 UNIT/ML-% IJ SOLN
INTRAMUSCULAR | Status: AC
  Filled 2017-06-10: qty 1000

## 2017-06-10 MED ORDER — INSULIN GLARGINE 100 UNIT/ML ~~LOC~~ SOLN
15.0000 [IU] | Freq: Every day | SUBCUTANEOUS | Status: DC
  Administered 2017-06-10: 15 [IU] via SUBCUTANEOUS
  Filled 2017-06-10 (×2): qty 0.15

## 2017-06-10 SURGICAL SUPPLY — 19 items
BALLN ARMADA 5X40X80 (BALLOONS) ×3
BALLN LUTONIX DCB 5X40X130 (BALLOONS) ×3
BALLOON ARMADA 5X40X80 (BALLOONS) ×2 IMPLANT
BALLOON LUTONIX DCB 5X40X130 (BALLOONS) ×2 IMPLANT
CATH OMNI FLUSH 5F 65CM (CATHETERS) ×3 IMPLANT
COVER PRB 48X5XTLSCP FOLD TPE (BAG) ×2 IMPLANT
COVER PROBE 5X48 (BAG) ×1
DEVICE CONTINUOUS FLUSH (MISCELLANEOUS) ×3 IMPLANT
DEVICE TORQUE .025-.038 (MISCELLANEOUS) ×3 IMPLANT
GUIDEWIRE ANGLED .035X260CM (WIRE) ×3 IMPLANT
KIT ENCORE 26 ADVANTAGE (KITS) ×3 IMPLANT
KIT MICROINTRODUCER STIFF 5F (SHEATH) ×3 IMPLANT
KIT PV (KITS) ×3 IMPLANT
SHEATH PINNACLE 5F 10CM (SHEATH) ×3 IMPLANT
SHEATH PINNACLE ST 6F 45CM (SHEATH) ×3 IMPLANT
SYR MEDRAD MARK V 150ML (SYRINGE) ×3 IMPLANT
TRANSDUCER W/STOPCOCK (MISCELLANEOUS) ×3 IMPLANT
TRAY PV CATH (CUSTOM PROCEDURE TRAY) ×3 IMPLANT
WIRE BENTSON .035X145CM (WIRE) ×3 IMPLANT

## 2017-06-10 NOTE — Interval H&P Note (Signed)
History and Physical Interval Note:  06/10/2017 10:45 AM  Sherri Brooks  has presented today for surgery, with the diagnosis of pvd   The various methods of treatment have been discussed with the patient and family. After consideration of risks, benefits and other options for treatment, the patient has consented to  Procedure(s): ABDOMINAL AORTOGRAM W/LOWER EXTREMITY (N/A) as a surgical intervention .  The patient's history has been reviewed, patient examined, no change in status, stable for surgery.  I have reviewed the patient's chart and labs.  Questions were answered to the patient's satisfaction.     Deitra Mayo

## 2017-06-10 NOTE — H&P (View-Only) (Signed)
Patient name: Sherri Brooks MRN: [id number] DOB: [birth year] Sex: female  REASON FOR VISIT:    Follow up of peripheral vascular disease.  HPI:   Sherri Brooks is a pleasant 71 y.o. female who I last saw on 03/06/2017. She has had multiple previous vascular procedures including bilateral iliac artery stents, and 2 bypasses in her left leg. Her most recent procedure was done in March 2017 when she underwent angioplasty and stenting of the left external iliac artery. At the time of her last visit, she had a small wound on the lateral aspect of her left transmetatarsal amputation but stated that this was improving. Duplex scan at the time of her last visit showed elevated velocities in the proximal stent of her left external iliac artery and ABI was 24% which had decreased compared to 41% previously. Her blood pressure was very poorly controlled at the time of her last visit and I asked her to follow up with her primary care physician so that we could get this under better control before proceeding with arteriography.  Since I saw her last she is continuing to have persistent pain in her left foot. She denies rest pain in the foot but has pain when she is ambulating. She denies fever or chills. The wound on the lateral aspect of her left transmetatarsal amputation remains open but she denies any drainage from this.  She denies any history of stroke, TIAs, expressive or receptive aphasia, or amaurosis fugax.  PSH:  2011: She had a left femoral to below knee popliteal artery bypass with PTFE. At that time she had transmetatarsal amputation for gangrenous wound of the right foot. The saphenous vein was explored at that time and was not adequate for a bypass conduit.  11/04/2012: The patient had a redo left femoral to below-knee popliteal artery bypass with a 6 mm PTFE graft. The old graft was chronically occluded.   Past Medical History:  Diagnosis Date  . Arthritis   . CAD (coronary artery  disease)   . Cellulitis of buttock, left 2010  . Diabetes mellitus   . GERD (gastroesophageal reflux disease)   . Hyperlipidemia   . Hypertension   . MI (myocardial infarction) ([place])   . PVD (peripheral vascular disease) ([place])   . Wears glasses     Family History  Problem Relation Age of Onset  . Brain cancer Father   . Cancer Father   . Diabetes Mother        many family members  . Alzheimer's disease Mother   . Heart disease Unknown     SOCIAL HISTORY: Social History  Substance Use Topics  . Smoking status: Former Smoker    Packs/day: 0.40    Years: 20.00    Types: Cigarettes    Start date: 11/04/2012    Quit date: 03/11/2016  . Smokeless tobacco: Never Used  . Alcohol use No    Allergies  Allergen Reactions  . Iohexol      Code: HIVES, Desc: PT STATES SHE EXPERIENCED ITCHING W/ IV DYE IN PAST-ARS 01/21/10, Onset Date: [id number]     Current Outpatient Prescriptions  Medication Sig Dispense Refill  . albuterol (PROVENTIL HFA;VENTOLIN HFA) 108 (90 BASE) MCG/ACT inhaler Inhale 2 puffs into the lungs every 6 (six) hours as needed. For bronchitis and coughing    . alendronate (FOSAMAX) 70 MG tablet Take 70 mg by mouth every 7 (seven) days. On Mondays    . aspirin 325 MG EC tablet Take 325 mg  by mouth daily.      . clopidogrel (PLAVIX) 75 MG tablet Take 75 mg by mouth daily.      . hydrochlorothiazide (HYDRODIURIL) 25 MG tablet Take 25 mg by mouth daily.    . insulin glargine (LANTUS) 100 UNIT/ML injection Inject 30 Units into the skin at bedtime.     . Iron-Vitamins (GERITOL COMPLETE PO) Take by mouth.    . labetalol (NORMODYNE) 200 MG tablet Take 200 mg by mouth 2 (two) times daily.      Marland Kitchen LORazepam (ATIVAN) 0.5 MG tablet Take 0.5 mg by mouth at bedtime.     Marland Kitchen ofloxacin (FLOXIN) 0.3 % otic solution Place 10 drops into the left ear 2 (two) times daily. For the next 14 days 15 mL 0  . Omega-3 Fatty Acids (FISH OIL) 1000 MG CPDR Take by mouth.    . simvastatin (ZOCOR) 40  MG tablet Take 40 mg by mouth at bedtime.      No current facility-administered medications for this visit.     REVIEW OF SYSTEMS:  [X]  denotes positive finding, [ ]  denotes negative finding Cardiac  Comments:  Chest pain or chest pressure:    Shortness of breath upon exertion:    Short of breath when lying flat:    Irregular heart rhythm:        Vascular    Pain in calf, thigh, or hip brought on by ambulation: X   Pain in feet at night that wakes you up from your sleep:     Blood clot in your veins:    Leg swelling:         Pulmonary    Oxygen at home:    Productive cough:     Wheezing:         Neurologic    Sudden weakness in arms or legs:     Sudden numbness in arms or legs:     Sudden onset of difficulty speaking or slurred speech:    Temporary loss of vision in one eye:     Problems with dizziness:         Gastrointestinal    Blood in stool:     Vomited blood:         Genitourinary    Burning when urinating:     Blood in urine:        Psychiatric    Major depression:         Hematologic    Bleeding problems:    Problems with blood clotting too easily:        Skin    Rashes or ulcers: X Lateral aspect of left transmetatarsal amputation       Constitutional    Fever or chills:     PHYSICAL EXAM:   Vitals:   06/05/17 1044  BP: (!) 221/83  Pulse: 100  Temp: (!) 97.1 F (36.2 C)  TempSrc: Oral  SpO2: 100%  Weight: 165 lb (74.8 kg)  Height: 5\' 3"  (1.6 m)    GENERAL: The patient is a well-nourished female, in no acute distress. The vital signs are documented above. CARDIAC: There is a regular rate and rhythm.  VASCULAR: She has a left carotid bruit. She has diminished but palpable femoral pulses. I cannot palpate pedal pulses. She has no significant lower extremity swelling. PULMONARY: There is good air exchange bilaterally without wheezing or rales. ABDOMEN: Soft and non-tender with normal pitched bowel sounds.  MUSCULOSKELETAL: She has a left  transmetatarsal amputation. NEUROLOGIC: No  focal weakness or paresthesias are detected. SKIN: She has a wound on the lateral aspect of her left transmetatarsal amputation which measures 1 cm x 2 cm. There is no erythema or drainage. PSYCHIATRIC: The patient has a normal affect.  DATA:    DUPLEX OF LEFT ILIAC ARTERY: Duplex scan on 03/06/2017 shows stenoses in both common iliac arteries and also the proximal aspect of the left external iliac artery stent.  MEDICAL ISSUES:   PERIPHERAL VASCULAR DISEASE: This patient has worsening pain in the left foot which I think is from ischemia. The wound is not improving and may be larger. I think she has critical limb ischemia and is at risk for limb loss given her severe multilevel arterial occlusive disease and a nonhealing wound on her left foot. I have recommended that we proceed with arteriography in order to determine if there are any further options for revascularization. We have discussed the indications for the procedure and the risks, including, but not limited to bleeding, arterial injury, renal failure, with a need for further surgery. I think without revascularization the wound on the left transmetatarsal amputation site will not heal and she would ultimately require a higher amputation. If she is not a candidate for an endovascular approach she might potentially be a can of her surgery although she's Selena Batten had 2 bypasses in the left leg and I do not think she has vein. If she did require open surgery certainly she would car preoperative cardiac evaluation given her cardiac history. Her arteriogram is scheduled for 06/10/2017. I'll make further recommendations pending these results.  HYPERTENSION: Her blood pressure in the office today was poorly controlled however she did not take her blood pressure medicine this morning. She will follow up with her primary care physician on this.  LEFT CAROTID BRUIT: She does have a left carotid bruit. This is a new  finding. I am unable to locate any other carotid duplex cancel I will arrange for her to get a carotid duplex scan when she returns to the office after her arteriogram. She is asymptomatic. She is on aspirin and is on Plavix and a statin.  Deitra Mayo [facility] [phone]

## 2017-06-10 NOTE — Telephone Encounter (Signed)
Sched appt 06/19/17; lab at 2, MD at 3:30. Lm on cell#.

## 2017-06-10 NOTE — Op Note (Signed)
PATIENT: Sherri Brooks      MRN: [id number] DOB: 1946-04-16    DATE OF PROCEDURE: 06/10/2017  INDICATIONS:    Sherri Brooks is a 71 y.o. female who presents with a nonhealing wound on her left transmetatarsal amputation site. She's had multiple previous interventions with iliac angioplasty and stenting bilaterally. She's had 2 femoral to below-knee popliteal artery bypass with PTFE as she has no adequate vein. These are both chronically occluded. She presents for arteriography and possible intervention.  PROCEDURE:    1. Ultrasound-guided access to the right common femoral artery 2. Aortogram with bilateral iliac arteriogram 3. Selective catheterization of the left external iliac artery with left lower extremity runoff 4. Drug coated balloon angioplasty of the left external iliac artery (5 mm x 4 cm balloon) 5. Retrograde right femoral arteriogram  SURGEON: Judeth Cornfield. Scot Dock, MD, FACS  ANESTHESIA: Local with sedation   EBL: Minimal  TECHNIQUE: The patient was taken to the peripheral vascular lab and was sedated. The period of conscious sedation was 68 minutes.  During that time period, I was present face-to-face 100% of the time.  The patient was administered 1 mg of Versed and 50 g of fentanyl. The patient's heart rate, blood pressure, and oxygen saturation were monitored by the nurse continuously during the procedure.  Both groins were prepped and draped in usual sterile fashion. Under ultrasound guidance, after the skin was anesthetized, the right common femoral artery was cannulated and a micropuncture sheath introduced over wire. As was exchanged for a 5 [place] sheath over a [employer] wire. An Omni Flush catheter was positioned at the L1 vertebral body and flush aortogram obtained. This catheter was in position into the left common iliac artery and the wire advanced into the external iliac artery. The catheter was advanced over the wire. Selective left external iliac arteriograms  obtained. There were 2 tandem 90% in stent stenoses within the previously stented left external iliac artery. I elected to address this with balloon angioplasty.  The 5 French sheath was exchanged for a long 6 [place] destination sheath was advanced over the wire into the left external iliac artery. The patient was heparinized. Next I selected a 5 mm x 4 cm balloon which was positioned across the area of the tandem stenoses and this was inflated to nominal pressure for 1 minute. I then went back with a drug coated balloon. This was a 5 mm x 4 cm balloon which was inflated to profile for 2 minutes and 30 seconds. Then a follow up film to evaluate the results and also do the left lower extremity runoff.  This showed an excellent result in the external iliac artery. There was a tight stenosis in the proximal deep femoral artery but I felt this was too high risk for dissection to address with angioplasty.  The sheath was retracted into the right external iliac artery and right external iliac arteriograms obtained with right lower extremity runoff.   FINDINGS:    1. There are single renal arteries bilaterally with no significant renal artery stenosis identified. The infrarenal aorta is patent. There is diffuse disease of bilateral common iliac arteries with no critical stenosis noted. 2. On the left side, there were 290% tandem stenoses within the external iliac artery which was successfully ballooned as described above with no residual stenosis. Below this the superficial femoral arteries occluded. There is a tight focal stenosis of the proximal deep femoral artery which was not felt to be amenable to angioplasty given  the risk of dissection. There is reconstitution of a diseased below-knee popliteal artery and single vessel runoff on the left via the peroneal artery. The anterior tibial and posterior tibial arteries are occluded. 3. On the right side, the sheath was occlusive in the common femoral artery.  The superficial femoral artery had moderate to severe diffuse disease throughout. The popliteal artery was patent with two-vessel runoff on the right via the peroneal and posterior tibial arteries. The anterior tibial artery on the right was occluded.  CLINICAL NOTE: She's had 2 failed femoropopliteal bypass grafts and does not have a vein conduit. Consideration could be given to endarterectomy of the stenosis in the proximal deep femoral artery that she would need preoperative cardiac clearance. She has a history of coronary artery disease and a previous myocardial infarction. We will arrange for preoperative cardiac clearance. In addition I will get her into the office for a carotid duplex scan is on her last visit she had a left carotid bruit. We can then discuss our options at that point.  Deitra Mayo, MD, FACS [facility]  DATE OF DICTATION:   06/10/2017

## 2017-06-10 NOTE — Interval H&P Note (Signed)
History and Physical Interval Note:  06/10/2017 10:38 AM  Sherri Brooks  has presented today for surgery, with the diagnosis of pvd   The various methods of treatment have been discussed with the patient and family. After consideration of risks, benefits and other options for treatment, the patient has consented to  Procedure(s): ABDOMINAL AORTOGRAM W/LOWER EXTREMITY (N/A) as a surgical intervention .  The patient's history has been reviewed, patient examined, no change in status, stable for surgery.  I have reviewed the patient's chart and labs.  Questions were answered to the patient's satisfaction.     Deitra Mayo

## 2017-06-10 NOTE — Progress Notes (Signed)
Pt BP elevated (SBP >160). 2nd IV access obtained. PRN hydralazine given.  Fritz Pickerel, RN

## 2017-06-10 NOTE — Progress Notes (Signed)
12 lead EKG done

## 2017-06-10 NOTE — Consult Note (Signed)
Cardiology Consultation:   Patient ID: VERNITA TAGUE; [id number]; [birth year]   Admit date: 06/10/2017 Date of Consult: 06/10/2017  Primary Care Provider: Jake Samples, PA-C Primary Cardiologist: New   Patient Profile:   Sherri Brooks is a 71 y.o. female with a hx of PVD, HTN, MI, GERD, DM who is being seen today for the evaluation of respiratory distress at the request of Dr. Scot Dock.  History of Present Illness:   Ms. Geisel has a history of multiple previous vascular procedures. She presented today for planned angiogram in evaluation for left foot worsening pain. While in the procedure at the end she developed sudden onset of shortness of breath and wheezing. Upon my arrival the patient was very dyspneic. She denies chest pain. She was given lasix 40 mg a few minutes prior to my arrival. Her lungs had coarse wet crackles and expiratory wheezes. She had taken her labetalol this am. Within about 10 minutes of my arrival, her breathing became much easier. Her blood pressure and heart rate were elevated and Hgb pre-procedure was down to 7.5. She has been told in the past that she was anemic and takes geritol with iron. She denies any obvious bleeding. She does have intermittent dark stools.   She states that she had a heart attack in the past but does not know the details. She does not recall if she had a cardiac cath. I do not see a report in Epic. Prior to this event she was not having chest pain, shortness of breath or orthopnea. She has had some wheezing over the previous 3-4 days and was wheezing this morning. She is a smoker 1/2 PPD, having quit in 2017  But she restarted when her Xanax was discontinued. She does not drink alcohol. She is unaware of any family history of cardiac issues.   Past Medical History:  Diagnosis Date  . Arthritis   . CAD (coronary artery disease)   . Cellulitis of buttock, left 2010  . Diabetes mellitus   . GERD (gastroesophageal reflux disease)     . Hyperlipidemia   . Hypertension   . MI (myocardial infarction) ([place])   . PVD (peripheral vascular disease) ([place])   . Wears glasses     Past Surgical History:  Procedure Laterality Date  . ABDOMINAL AORTAGRAM N/A 07/28/2012   Procedure: ABDOMINAL Maxcine Ham;  Surgeon: Angelia Mould, MD;  Location: [facility] CATH LAB;  Service: Cardiovascular;  Laterality: N/A;  . COLONOSCOPY  10/26/2011   Procedure: COLONOSCOPY;  Surgeon: Dorothyann Peng, MD;  Location: [facility] ENDO SUITE;  Service: Endoscopy;  Laterality: N/A;  1:30  . FEMORAL-POPLITEAL BYPASS GRAFT  07/2010   pt has had 3 fem-pop bpg  . FEMORAL-POPLITEAL BYPASS GRAFT  11/04/2012   Procedure: BYPASS GRAFT FEMORAL-POPLITEAL ARTERY;  Surgeon: Angelia Mould, MD;  Location: [facility] OR;  Service: Vascular;  Laterality: Left;  Redo Left Femoral Popliteal Bypass with PTFE  . FOOT AMPUTATION  2010   left  . LOWER EXTREMITY ANGIOGRAM Bilateral 07/28/2012   Procedure: LOWER EXTREMITY ANGIOGRAM;  Surgeon: Angelia Mould, MD;  Location: [facility] CATH LAB;  Service: Cardiovascular;  Laterality: Bilateral;  . LOWER EXTREMITY ANGIOGRAM Left 01/02/2016   Procedure: Lower Extremity Angiogram;  Surgeon: Angelia Mould, MD;  Location: [place] CV LAB;  Service: Cardiovascular;  Laterality: Left;  . ORIF ANKLE FRACTURE Left 01/27/2013   Procedure: OPEN REDUCTION INTERNAL FIXATION (ORIF) ANKLE FRACTURE;  Surgeon: Wylene Simmer, MD;  Location: [place];  Service: Orthopedics;  Laterality: Left;  . PERCUTANEOUS STENT INTERVENTION Left 07/28/2012   Procedure: PERCUTANEOUS STENT INTERVENTION;  Surgeon: Angelia Mould, MD;  Location: [facility] CATH LAB;  Service: Cardiovascular;  Laterality: Left;  lt ext tiliac stentx1  . PERIPHERAL VASCULAR CATHETERIZATION N/A 01/02/2016   Procedure: Abdominal Aortogram;  Surgeon: Angelia Mould, MD;  Location: [place] CV LAB;  Service: Cardiovascular;  Laterality: N/A;  . PERIPHERAL VASCULAR  CATHETERIZATION Left 01/02/2016   Procedure: Peripheral Vascular Intervention;  Surgeon: Angelia Mould, MD;  Location: [place] CV LAB;  Service: Cardiovascular;  Laterality: Left;  lt ext iliac  . right common iliac stent  10/2009   Dr Doren Custard ([place])  . S/P Hysterecotmy     partial  . TONSILLECTOMY       Home Medications:  Prior to Admission medications   Medication Sig Start Date End Date Taking? Authorizing Provider  acetaminophen (TYLENOL) 500 MG tablet Take 500-1,000 mg by mouth 2 (two) times daily.   Yes [provider]  albuterol (PROVENTIL HFA;VENTOLIN HFA) 108 (90 BASE) MCG/ACT inhaler Inhale 2 puffs into the lungs every 6 (six) hours as needed. For bronchitis and coughing   Yes [provider]  alendronate (FOSAMAX) 70 MG tablet Take 70 mg by mouth every 7 (seven) days. On Mondays 08/30/11  Yes [provider]  aspirin 325 MG EC tablet Take 325 mg by mouth daily.     Yes [provider]  clopidogrel (PLAVIX) 75 MG tablet Take 75 mg by mouth daily.     Yes [provider]  gabapentin (NEURONTIN) 100 MG capsule Take 100 mg by mouth 3 (three) times daily as needed (general pain).   Yes [provider]  hydrochlorothiazide (HYDRODIURIL) 25 MG tablet Take 25 mg by mouth daily.   Yes [provider]  insulin glargine (LANTUS) 100 UNIT/ML injection Inject 30 Units into the skin at bedtime.    Yes [provider]  Iron-Vitamins (GERITOL COMPLETE PO) Take 1 tablet by mouth daily.    Yes [provider]  labetalol (NORMODYNE) 200 MG tablet Take 200 mg by mouth 2 (two) times daily.     Yes [provider]  LORazepam (ATIVAN) 0.5 MG tablet Take 0.5 mg by mouth at bedtime.    Yes [provider]  Omega-3 Fatty Acids (FISH OIL) 1000 MG CPDR Take 1,000 mg by mouth daily.    Yes [provider]  simvastatin (ZOCOR) 40 MG tablet Take 40 mg by mouth at bedtime.  08/30/11  Yes [provider]    Inpatient Medications: Scheduled Meds: . ipratropium-albuterol  3 mL Nebulization Q6H   Continuous Infusions: . sodium chloride 100 mL/hr at 06/10/17 0710   PRN Meds:   Allergies:    Allergies  Allergen Reactions  . Iohexol      Code: HIVES, Desc: PT STATES SHE EXPERIENCED ITCHING W/ IV DYE IN PAST-ARS 01/21/10, Onset Date: [id number]     Social History:   Social History   Social History  . Marital status: Widowed    Spouse name: N/A  . Number of children: 3  . Years of education: N/A   Occupational History  . retired; Product/process development scientist Retired   Social History Main Topics  . Smoking status: Former Smoker    Packs/day: 0.40    Years: 20.00    Types: Cigarettes    Start date: 11/04/2012    Quit date: 03/11/2016  . Smokeless tobacco: Never Used  .  Alcohol use No  . Drug use: No  . Sexual activity: Not on file   Other Topics Concern  . Not on file   Social History Narrative  . No narrative on file    Family History:    Family History  Problem Relation Age of Onset  . Brain cancer Father   . Cancer Father   . Diabetes Mother        many family members  . Alzheimer's disease Mother   . Heart disease Unknown      ROS:  Please see the history of present illness.  ROS  All other ROS reviewed and negative.     Physical Exam/Data:   Vitals:   06/10/17 0647  BP: (!) 154/57  Pulse: 76  Resp: 18  Temp: 97.7 F (36.5 C)  TempSrc: Oral  SpO2: 100%  Weight: 165 lb (74.8 kg)  Height: 5\' 3"  (1.6 m)   No intake or output data in the 24 hours ending 06/10/17 1239 Filed Weights   06/10/17 0647  Weight: 165 lb (74.8 kg)   Body mass index is 29.23 kg/m.  General:  Well nourished, well developed, in acute respiratory distress, improving. HEENT: normal Lymph: no adenopathy Neck: no JVD Endocrine:  No thryomegaly Vascular: No carotid bruits; FA pulses 2+ bilaterally without bruits  Cardiac:  normal S1, S2; RRR; no murmur  Lungs:   Lungs with diffuse expiratory wheezes. Moist crackles in lower 1/2 Abd: soft, nontender, no hepatomegaly  Ext: no edema Musculoskeletal:  No deformities, BUE and BLE strength normal and equal Skin: warm and dry  Neuro:  CNs 2-12 intact, no focal abnormalities noted Psych:  Normal affect   EKG:  The EKG was personally reviewed and demonstrates:  NSR 97 bpm, LVH, QTC 510 Telemetry:  Telemetry was personally reviewed and demonstrates:  SR in the 90's to low 100's  Relevant CV Studies:  No reports in Epic  Laboratory Data:  Chemistry Recent Labs Lab 06/10/17 0710  NA 142  K 3.9  CL 109  GLUCOSE 147*  BUN 20  CREATININE 1.40*    No results for input(s): PROT, ALBUMIN, AST, ALT, ALKPHOS, BILITOT in the last 168 hours. Hematology Recent Labs Lab 06/10/17 0710  HGB 7.5*  HCT 22.0*   Cardiac EnzymesNo results for input(s): TROPONINI in the last 168 hours. No results for input(s): TROPIPOC in the last 168 hours.  BNPNo results for input(s): BNP, PROBNP in the last 168 hours.  DDimer No results for input(s): DDIMER in the last 168 hours.  Radiology/Studies:  No results found.  Assessment and Plan:   Acute respiratory difficulty -Pt developed acute respiratory distress during angiogram procedure. She maintained adequate saturation. SHe has significant improvement with IV lasix and HHN. Still has wheezes.  -The pt is a smoker and may have some underlying COPD. She notes that she has had wheezes over the last 3-4 days and this am.  -She has a history of ?MI (no notes in EPIC). No chest pain or recent heart failure symptoms. EKG is without acute ischemic changes. -Pt is to be admitted by [facility]. -check chest xray for pulmonary edema and COPD changes. Will check echo, cardiac enzymes and BNP. -Consider further diuresis as needed.   Hypertension -Continue home BP meds  Anemia -Hgb down to 7.5 today (prior to procedure) from 12.9 a year ago -Pt has history of  anemia and takes geritol with iron. -No frank bleeding noted by pt, but has intermittent dark stools.  -  Advise further investigation by IM  Hyperlipidemia -Continue simvastatin 40 mg  -No recent lipid panel. Possibly managed by PCP.  Diabetes Type 2 -Hbg A1c today is 9.7 in 2013. -Presumable managed by her PCP.   Signed, Daune Perch, NP  06/10/2017 12:39 PM  As above, patient seen and examined. Briefly she is a 71 year old female with past medical history of diabetes mellitus, peripheral vascular disease, hypertension, question myocardial infarction who underwent angiogram earlier today who we are asked to evaluate for sudden onset dyspnea and hypertension. Patient states she has had some wheezing over the past several days. Following her arteriogram today she became abruptly short of breath. She received approximately 500 cc of IV fluids during the procedure. She denies chest pain, palpitations. Prior to my arrival she was given Lasix 40 mg IV and labetalol for blood pressure of 210/100. She feels much improved. Cardiology now asked to evaluate. Note her hemoglobin prior to the procedure was noted to be 7.5. She does have occasional dark stools but takes Geritol with iron. Electrocardiogram shows sinus rhythm, left ventricular hypertrophy, increased ST depression in the inferolateral leads.  1 acute dyspnea-much improved at time of my evaluation. Possibly secondary to acute diastolic congestive heart failure. Will plan to repeat Lasix tomorrow morning unless she needs it sooner. Check echocardiogram for LV function. Cycle enzymes to exclude ischemia. Follow blood pressure and increase medications as needed (hypertension may also have contributed and this has improved following IV labetalol). Further evaluation based on results of above studies. We are awaiting results of PA and lateral chest x-ray. There may be a contribution from COPD. The hospitalists are evaluating and treating.   2  hypertension-continue preadmission medications and follow closely. Increase as needed.  3 anemia-weighted check CBC. Etiology unclear. Further evaluation per primary service.  4 peripheral vascular disease-continue aspirin and statin. Management per vascular surgery.   Kirk Ruths MD

## 2017-06-10 NOTE — Progress Notes (Signed)
CRITICAL VALUE ALERT  Critical Value:  Troponin 0.13  Date & Time Notied:  06/10/17 21:57  Provider Notified: Paged T. Opyd at 22:15 with critical value noted in page.   Orders Received/Actions taken: Dr. Myna Hidalgo reviewed labs and will place order for third troponin.

## 2017-06-10 NOTE — H&P (Signed)
History and Physical    Sherri Brooks:[id number] DOB: Oct 30, 1945 DOA: 06/10/2017  PCP: Jake Samples, PA-C Patient coming from: home  Chief Complaint: dyspnea  HPI: Sherri Brooks is a 71 y.o. female with medical history significant hypertension, diabetes, peripheral vascular disease status post left femoral to below knee popliteal artery bypass grafting 2011, hyperlipidemia, GERD, CAD underwent angiogram today and developed dyspnea. Patient needed to be admitted so TR[facility] called. Initial evaluation reveals respiratory distress likely multifactorial specifically mild COPD exacerbation, volume overload in the setting of anemia.  Information is obtained from the chart and the patient. Patient underwent arteriogram/angioplasty (see op note) and reportedly did well but developed shortness of breath postprocedure. She was given 40 mg of Lasix intravenously and oxygen supplementation. Patient denies chest pain palpitations she denies any history of wheezing or oxygen use at home. She denies cough.  She denies abdominal pain nausea.No dysuria, hematuria, frequency or urgency. No constipation or diarrhea  Of note chart review indicates the patient has had 2 failed femoropopliteal bypass grafts and does not have a vein conduit. She also has a history of myocardial infarction. Cardiovascular note indicates  endarterectomy of the stenosis in the proximal deep femoral artery could be considered. CVTS has requested cards consult as well   ED Course: In the Cath Lab holding area patient is afebrile hypertensive mildly tachycardic with a heart rate of 101. Oxygen saturation level 95% on 3 L nasal cannula.  Review of Systems: As per HPI otherwise all other systems reviewed and are negative.   Ambulatory Status: Uses cane and/or a wheelchair. Minimal assist with ADLs  Past Medical History:  Diagnosis Date  . Arthritis   . CAD (coronary artery disease)   . Cellulitis of buttock, left 2010  .  Diabetes mellitus   . GERD (gastroesophageal reflux disease)   . Hyperlipidemia   . Hypertension   . MI (myocardial infarction) ([place])   . PVD (peripheral vascular disease) ([place])   . Wears glasses     Past Surgical History:  Procedure Laterality Date  . ABDOMINAL AORTAGRAM N/A 07/28/2012   Procedure: ABDOMINAL Maxcine Ham;  Surgeon: Angelia Mould, MD;  Location: [facility] CATH LAB;  Service: Cardiovascular;  Laterality: N/A;  . COLONOSCOPY  10/26/2011   Procedure: COLONOSCOPY;  Surgeon: Dorothyann Peng, MD;  Location: [facility]O SU[facility];  Service: Endoscopy;  Laterality: N/A;  1:30  . FEMORAL-POPLITEAL BYPASS GRAFT  07/2010   pt has had 3 fem-pop bpg  . FEMORAL-POPLITEAL BYPASS GRAFT  11/04/2012   Procedure: BYPASS GRAFT FEMORAL-POPLITEAL ARTERY;  Surgeon: Angelia Mould, MD;  Location: [facility] OR;  Service: Vascular;  Laterality: Left;  Redo Left Femoral Popliteal Bypass with PTFE  . FOOT AMPUTATION  2010   left  . LOWER EXTREMITY ANGIOGRAM Bilateral 07/28/2012   Procedure: LOWER EXTREMITY ANGIOGRAM;  Surgeon: Angelia Mould, MD;  Location: [facility] CATH LAB;  Service: Cardiovascular;  Laterality: Bilateral;  . LOWER EXTREMITY ANGIOGRAM Left 01/02/2016   Procedure: Lower Extremity Angiogram;  Surgeon: Angelia Mould, MD;  Location: [place] CV LAB;  Service: Cardiovascular;  Laterality: Left;  . ORIF ANKLE FRACTURE Left 01/27/2013   Procedure: OPEN REDUCTION INTERNAL FIXATION (ORIF) ANKLE FRACTURE;  Surgeon: Wylene Simmer, MD;  Location: [place];  Service: Orthopedics;  Laterality: Left;  . PERCUTANEOUS STENT INTERVENTION Left 07/28/2012   Procedure: PERCUTANEOUS STENT INTERVENTION;  Surgeon: Angelia Mould, MD;  Location: [facility] CATH LAB;  Service: Cardiovascular;  Laterality: Left;  lt ext  tiliac stentx1  . PERIPHERAL VASCULAR CATHETERIZATION N/A 01/02/2016   Procedure: Abdominal Aortogram;  Surgeon: Angelia Mould, MD;  Location: [place] CV LAB;   Service: Cardiovascular;  Laterality: N/A;  . PERIPHERAL VASCULAR CATHETERIZATION Left 01/02/2016   Procedure: Peripheral Vascular Intervention;  Surgeon: Angelia Mould, MD;  Location: [place] CV LAB;  Service: Cardiovascular;  Laterality: Left;  lt ext iliac  . right common iliac stent  10/2009   Dr Doren Custard ([place])  . S/P Hysterecotmy     partial  . TONSILLECTOMY      Social History   Social History  . Marital status: Widowed    Spouse name: N/A  . Number of children: 3  . Years of education: N/A   Occupational History  . retired; Product/process development scientist Retired   Social History Main Topics  . Smoking status: Former Smoker    Packs/day: 0.40    Years: 20.00    Types: Cigarettes    Start date: 11/04/2012    Quit date: 03/11/2016  . Smokeless tobacco: Never Used  . Alcohol use No  . Drug use: No  . Sexual activity: Not on file   Other Topics Concern  . Not on file   Social History Narrative  . No narrative on file    Allergies  Allergen Reactions  . Iohexol      Code: HIVES, Desc: PT STATES SHE EXPERIENCED ITCHING W/ IV DYE IN PAST-ARS 01/21/10, Onset Date: [id number]     Family History  Problem Relation Age of Onset  . Brain cancer Father   . Cancer Father   . Diabetes Mother        many family members  . Alzheimer's disease Mother   . Heart disease Unknown     Prior to Admission medications   Medication Sig Start Date End Date Taking? Authorizing Provider  acetaminophen (TYLENOL) 500 MG tablet Take 500-1,000 mg by mouth 2 (two) times daily.   Yes [provider]  albuterol (PROVENTIL HFA;VENTOLIN HFA) 108 (90 BASE) MCG/ACT inhaler Inhale 2 puffs into the lungs every 6 (six) hours as needed. For bronchitis and coughing   Yes [provider]  alendronate (FOSAMAX) 70 MG tablet Take 70 mg by mouth every 7 (seven) days. On Mondays 08/30/11  Yes [provider]  aspirin 325 MG EC tablet Take 325 mg by mouth daily.     Yes [provider]  clopidogrel (PLAVIX) 75 MG tablet Take 75 mg by mouth daily.     Yes [provider]  gabapentin (NEURONTIN) 100 MG capsule Take 100 mg by mouth 3 (three) times daily as needed (general pain).   Yes [provider]  hydrochlorothiazide (HYDRODIURIL) 25 MG tablet Take 25 mg by mouth daily.   Yes [provider]  insulin glargine (LANTUS) 100 UNIT/ML injection Inject 30 Units into the skin at bedtime.    Yes [provider]  Iron-Vitamins (GERITOL COMPLETE PO) Take 1 tablet by mouth daily.    Yes [provider]  labetalol (NORMODYNE) 200 MG tablet Take 200 mg by mouth 2 (two) times daily.     Yes [provider]  LORazepam (ATIVAN) 0.5 MG tablet Take 0.5 mg by mouth at bedtime.    Yes [provider]  Omega-3 Fatty Acids (FISH OIL) 1000 MG CPDR Take 1,000 mg by mouth daily.    Yes [provider]  simvastatin (ZOCOR) 40 MG tablet Take 40 mg by mouth at bedtime.  08/30/11  Yes [provider]    Physical Exam: Vitals:   06/10/17 1303 06/10/17 1305 06/10/17 1308 06/10/17 1313  BP:  (!) 191/52    Pulse: (!) 0 97 (!) 0 (!) 0  Resp: (!) 0 (!) 24 (!) 0 (!) 0  Temp:      TempSrc:      SpO2: (!) 0% 99% (!) 0% (!) 0%  Weight:      Height:         General:  Appears calm and comfortable, in bed status post Cath Lab Eyes:  PERRL, EOMI, normal lids, iris ENT:  grossly normal hearing, lips & tongue, his membranes of her mouth are slightly dry but pink Neck:  no LAD, masses or thyromegaly Cardiovascular:  RRR, no m/r/g. No LE edema. Left foot partially amputated, sheath and left groin intact site clean and dry Respiratory:  No increased work of breathing. Breath sounds coarse throughout. Diffuse wheezing with crackles. Abdomen:  soft, ntnd, positive bowel sounds throughout no guarding or rebounding Skin:  no rash or induration seen on limited exam Musculoskeletal:  grossly normal tone BUE/BLE, good ROM,  no bony abnormality Psychiatric:  grossly normal mood and affect, speech fluent and appropriate, AOx3 Neurologic:  CN 2-12 grossly intact, moves all extremities in coordinated fashion, sensation intact  Labs on Admission: I have personally reviewed following labs and imaging studies  CBC:  Recent Labs Lab 06/10/17 0710  HGB 7.5*  HCT 93.8*   Basic Metabolic Panel:  Recent Labs Lab 06/10/17 0710  NA 142  K 3.9  CL 109  GLUCOSE 147*  BUN 20  CREATININE 1.40*   GFR: Estimated Creatinine Clearance: 35.2 mL/min (A) (by C-G formula based on SCr of 1.4 mg/dL (H)). Liver Function Tests: No results for input(s): AST, ALT, ALKPHOS, BILITOT, PROT, ALBUMIN in the last 168 hours. No results for input(s): LIPASE, AMYLASE in the last 168 hours. No results for input(s): AMMONIA in the last 168 hours. Coagulation Profile: No results for input(s): INR, PROTIME in the last 168 hours. Cardiac Enzymes: No results for input(s): CKTOTAL, CKMB, CKMBINDEX, TROPONINI in the last 168 hours. BNP (last 3 results) No results for input(s): PROBNP in the last 8760 hours. HbA1C: No results for input(s): HGBA1C in the last 72 hours. CBG:  Recent Labs Lab 06/10/17 1311  GLUCAP 189*   Lipid Profile: No results for input(s): CHOL, HDL, LDLCALC, TRIG, CHOLHDL, LDLDIRECT in the last 72 hours. Thyroid Function Tests: No results for input(s): TSH, T4TOTAL, FREET4, T3FREE, THYROIDAB in the last 72 hours. Anemia Panel: No results for input(s): VITAMINB12, FOLATE, FERRITIN, TIBC, IRON, RETICCTPCT in the last 72 hours. Urine analysis:    Component Value Date/Time   COLORURINE YELLOW 11/06/2012 1631   APPEARANCEUR HAZY (A) 11/06/2012 1631   LABSPEC 1.026 11/06/2012 1631   PHURINE 5.0 11/06/2012 1631   GLUCOSEU 100 (A) 11/06/2012 1631   HGBUR NEGATIVE 11/06/2012 1631   BILIRUBINUR NEGATIVE 11/06/2012 1631   KETONESUR NEGATIVE 11/06/2012 1631   PROTEINUR 100 (A) 11/06/2012 1631   UROBILINOGEN 0.2  11/06/2012 1631   NITRITE NEGATIVE 11/06/2012 1631   LEUKOCYTESUR NEGATIVE 11/06/2012 1631    Creatinine Clearance: Estimated Creatinine Clearance: 35.2 mL/min (A) (by C-G formula based on SCr of 1.4 mg/dL (H)).  Sepsis Labs: @LABRCNTIP (procalcitonin:4,lacticidven:4) )No results found for this or any previous visit (from the past 240 hour(s)).   Radiological Exams on Admission: No results found.  EKG: Independently reviewed. Normal sinus rhythm Left ventricular hypertrophy with repolarization abnormality Cannot rule  out Septal infarct , age undetermined Prolonged QT  Assessment/Plan Principal Problem:   Dyspnea Active Problems:   Atherosclerosis of native arteries of the extremities with ulceration (HCC)   Diabetes ([place])   Tobacco use disorder   Anemia of unknown etiology   Acute kidney injury ([place])   Essential hypertension   #1. Dyspnea. Likely multifactorial specifically mild exacerbation of undiagnosed COPD in setting of possible volume overload during angiogram and anemia. Oxygen saturation level remained greater than 90% on 3L.EKG without acute changes.  She is provided with 40 mg of Lasix with markedly improvement. She is a smoker. Chest x-ray pending. Cardiology called for consult as well -Admit to step down -Follow chest x-ray -Scheduled nebs -Cycle troponins per cardiology -Echocardiogram per cards -Oxygen supplementation as needed -Transfuse 1 unit of packed red blood cells -Defer diuresis to cardiology -Recommend outpatient pulmonary function tests  #2. Acute kidney injury. Chart review indicates creatinine above baseline. Patient does history of acute kidney injury correlating with past procedures involving dye. -Hold nephrotoxins -Monitor urine output -Recheck  #3. Hypertension. Poor control.e meds include hydrochlorothiazide and labetalol. -We'll resume home medications -Monitor -When necessary hydralazine  #4. Anemia. Hemoglobin 7.5. Chart review  indicates history of same however last year hemoglobin was 12.9. Patient denies any obvious bleeding. -FOBT -Anemia panel -Transfuse 1 unit packed RBCs -Continue home geritol with iron -Repeat hemoglobin posttransfusion  5. Diabetes. Serum glucose 144 on admission. Home medications include Lantus 30 units at bedtime -We will give Lantus at half her home dose -Carb modified diet -Obtain a hemoglobin A1c -Sliding scale insulin for optimal control  #6. Tobacco use. -Cessation counseling   DVT prophylaxis: heparin during procedure. Will resume home plavix tomorrow  Code Status: full  Family Communication: none present  Disposition Plan: home  Consults called: Stanford Breed, d[place]  Admission status: inpatient    Radene Gunning MD [facility]  If 7PM-7AM, please contact night-coverage [email or web address] Password [email or web address]  06/10/2017, 1:17 PM

## 2017-06-10 NOTE — Progress Notes (Signed)
Notified Dr Scot Dock that patient's hgb is 7.5, last labs drawn was 1 yr ago was 12.7.  Ok to proceed with procedure.

## 2017-06-10 NOTE — Progress Notes (Signed)
Pt received from cath lab. R groin site level 1. Small amount of drainage on gauze marked. Site soft. VSS. Telemetry applied, CCMD notified x2. Call bell within reach, family at bedside, will continue to monitor.    Fritz Pickerel, RN

## 2017-06-10 NOTE — Progress Notes (Signed)
   VASCULAR SURGERY ASSESSMENT & PLAN:   The patient developed shortness of breath after the procedure. She does have a history of a dye allergy but was premedicated prior to the procedure. On exam she sounds wet and she is receiving Lasix. She denies any chest pain. We will also have respiratory give her a breathing treatment.  Given her multiple medical issues including shortness of breath, anemia, chronic kidney disease, and coronary artery disease think she will likely require admission and I have consult the hospitalist. In addition I have consult cardiology. She will likely require admission.  I will also obtain a chest x-ray.   SUBJECTIVE:   Complains of shortness of breath but no chest pain.  PHYSICAL EXAM:   Vitals:   06/10/17 0647  BP: (!) 154/57  Pulse: 76  Resp: 18  Temp: 97.7 F (36.5 C)  TempSrc: Oral  SpO2: 100%  Weight: 165 lb (74.8 kg)  Height: 5\' 3"  (1.6 m)   LUNGS: Decreased breath sounds at bases.  LABS:   Lab Results  Component Value Date   WBC 16.4 (H) 11/07/2012   HGB 7.5 (L) 06/10/2017   HCT 22.0 (L) 06/10/2017   MCV 77.8 (L) 11/07/2012   PLT 284 11/07/2012   Lab Results  Component Value Date   CREATININE 1.40 (H) 06/10/2017   Lab Results  Component Value Date   INR 1.00 11/03/2012    PROBLEM LIST:    PERIPHERAL VASCULAR DISEASE WITH ULCERATION Coronary artery disease with history of previous myocardial infarction Shortness of breath Anemia Chronic kidney disease  CURRENT MEDS:     Gae Gallop Beeper: [phone] Office: [phone] 06/10/2017

## 2017-06-10 NOTE — Telephone Encounter (Signed)
-----   Message from Mena Goes, RN sent at 06/10/2017 12:52 PM EDT ----- Regarding: Wednesday appt w/ Dr. Scot Dock, Carotid Duplex, Cardiac clearance   ----- Message ----- From: Angelia Mould, MD Sent: 06/10/2017  12:23 PM To: Vvs Charge Pool  PROCEDURE:   1. Ultrasound-guided access to the right common femoral artery 2. Aortogram with bilateral iliac arteriogram 3. Selective catheterization of the left external iliac artery with left lower extremity runoff 4. Drug coated balloon angioplasty of the left external iliac artery (5 mm x 4 cm balloon) 5. Retrograde right femoral arteriogram  SURGEON: Judeth Cornfield. Scot Dock, MD, FACS  This patient needs a left common femoral artery endarterectomy but will need preoperative cardiac clearance. She will also need an office visit to discuss this and also needs a carotid duplex at that time because of a left carotid bruit. I could potentially see her next Wednesday. CD

## 2017-06-10 NOTE — Progress Notes (Addendum)
Site area: rt groin fa sheath Site Prior to Removal:  Level 1 hematoma around sheath site Pressure Applied For: 20 minutes Manual:   yes Patient Status During Pull:  stable Post Pull Site:  Level 1, hematoma mostly resolved. No ridge. Faint bruising Post Pull Instructions Given:  yes Post Pull Pulses Present: dopplered Dressing Applied:  Gauze and tegaderm Bedrest begins @ 1425 Comments: Voided large amount incontinently. Sheets changed

## 2017-06-11 ENCOUNTER — Encounter (HOSPITAL_COMMUNITY): Payer: Self-pay | Admitting: Vascular Surgery

## 2017-06-11 ENCOUNTER — Inpatient Hospital Stay (HOSPITAL_COMMUNITY): Payer: Medicare HMO

## 2017-06-11 ENCOUNTER — Telehealth: Payer: Self-pay | Admitting: Vascular Surgery

## 2017-06-11 DIAGNOSIS — F17200 Nicotine dependence, unspecified, uncomplicated: Secondary | ICD-10-CM

## 2017-06-11 DIAGNOSIS — I7025 Atherosclerosis of native arteries of other extremities with ulceration: Secondary | ICD-10-CM

## 2017-06-11 DIAGNOSIS — I340 Nonrheumatic mitral (valve) insufficiency: Secondary | ICD-10-CM

## 2017-06-11 LAB — BASIC METABOLIC PANEL
Anion gap: 10 (ref 5–15)
BUN: 34 mg/dL — AB (ref 6–20)
CALCIUM: 8.8 mg/dL — AB (ref 8.9–10.3)
CHLORIDE: 106 mmol/L (ref 101–111)
CO2: 21 mmol/L — AB (ref 22–32)
CREATININE: 1.8 mg/dL — AB (ref 0.44–1.00)
GFR calc non Af Amer: 27 mL/min — ABNORMAL LOW (ref >60)
GFR, EST AFRICAN AMERICAN: 31 mL/min — AB (ref >60)
Glucose, Bld: 236 mg/dL — ABNORMAL HIGH (ref 65–99)
Potassium: 3.4 mmol/L — ABNORMAL LOW (ref 3.5–5.1)
Sodium: 137 mmol/L (ref 135–145)

## 2017-06-11 LAB — ECHOCARDIOGRAM COMPLETE
Height: 63 in
Weight: 2680.79 oz

## 2017-06-11 LAB — GLUCOSE, CAPILLARY
GLUCOSE-CAPILLARY: 230 mg/dL — AB (ref 65–99)
Glucose-Capillary: 229 mg/dL — ABNORMAL HIGH (ref 65–99)
Glucose-Capillary: 291 mg/dL — ABNORMAL HIGH (ref 65–99)
Glucose-Capillary: 248 mg/dL — ABNORMAL HIGH (ref 65–99)
Glucose-Capillary: 243 mg/dL — ABNORMAL HIGH (ref 65–99)

## 2017-06-11 LAB — TROPONIN I
TROPONIN I: 0.27 ng/mL — AB (ref <0.03)
TROPONIN I: 0.25 ng/mL — AB (ref <0.03)
Troponin I: 0.2 ng/mL (ref <0.03)
Troponin I: 0.26 ng/mL (ref <0.03)

## 2017-06-11 LAB — PREPARE RBC (CROSSMATCH)

## 2017-06-11 LAB — CBC
HEMATOCRIT: 22.7 % — AB (ref 36.0–46.0)
Hemoglobin: 7.3 g/dL — ABNORMAL LOW (ref 12.0–15.0)
MCH: 24.3 pg — AB (ref 26.0–34.0)
MCHC: 32.2 g/dL (ref 30.0–36.0)
MCV: 75.7 fL — ABNORMAL LOW (ref 78.0–100.0)
Platelets: 339 10*3/uL (ref 150–400)
RBC: 3 MIL/uL — ABNORMAL LOW (ref 3.87–5.11)
RDW: 16.2 % — AB (ref 11.5–15.5)
WBC: 8.4 10*3/uL (ref 4.0–10.5)

## 2017-06-11 LAB — HEMOGLOBIN AND HEMATOCRIT, BLOOD
HEMATOCRIT: 22.3 % — AB (ref 36.0–46.0)
HEMOGLOBIN: 7.1 g/dL — AB (ref 12.0–15.0)

## 2017-06-11 MED ORDER — INSULIN GLARGINE 100 UNIT/ML ~~LOC~~ SOLN
20.0000 [IU] | Freq: Every day | SUBCUTANEOUS | Status: DC
  Administered 2017-06-11 – 2017-06-14 (×4): 20 [IU] via SUBCUTANEOUS
  Filled 2017-06-11 (×5): qty 0.2

## 2017-06-11 MED ORDER — HEPARIN SODIUM (PORCINE) 5000 UNIT/ML IJ SOLN
5000.0000 [IU] | Freq: Three times a day (TID) | INTRAMUSCULAR | Status: DC
  Administered 2017-06-11 – 2017-06-15 (×10): 5000 [IU] via SUBCUTANEOUS
  Filled 2017-06-11 (×10): qty 1

## 2017-06-11 MED ORDER — SODIUM CHLORIDE 0.9 % IV SOLN: INTRAVENOUS | Status: DC

## 2017-06-11 MED ORDER — ALBUTEROL SULFATE (2.5 MG/3ML) 0.083% IN NEBU: 2.5000 mg | INHALATION_SOLUTION | Freq: Four times a day (QID) | RESPIRATORY_TRACT | Status: DC | PRN

## 2017-06-11 MED ORDER — POTASSIUM CHLORIDE CRYS ER 20 MEQ PO TBCR
40.0000 meq | EXTENDED_RELEASE_TABLET | Freq: Once | ORAL | Status: AC
  Administered 2017-06-11: 40 meq via ORAL
  Filled 2017-06-11: qty 2

## 2017-06-11 MED ORDER — SODIUM CHLORIDE 0.9 % IV SOLN: Freq: Once | INTRAVENOUS | Status: DC

## 2017-06-11 NOTE — Telephone Encounter (Signed)
Sched CSD appt 06/19/17; lab at 2:00 and MD at 3:30. Lm on cell#.

## 2017-06-11 NOTE — Telephone Encounter (Signed)
-----   Message from Mena Goes, RN sent at 06/11/2017  9:12 AM EDT ----- Regarding: RE: Wednesday appt w/ Dr. Scot Dock, Carotid Duplex, Cardiac clearance Leave it for now, she is having SOB and cardio is seeing her ----- Message ----- From: Georgiann Mccoy Sent: 06/11/2017   9:07 AM To: Mena Goes, RN Subject: RE: Wednesday appt w/ Dr. Scot Dock, Carotid D#  To confirm, I should cancel the 06/19/17 appt here?  ----- Message ----- From: Mena Goes, RN Sent: 06/11/2017   9:01 AM To: Georgiann Mccoy Subject: RE: Wednesday appt w/ Dr. Scot Dock, Carotid D#  Don't worry about this, she was admitted last night.   ----- Message ----- From: Georgiann Mccoy Sent: 06/10/2017   3:21 PM To: Mena Goes, RN Subject: RE: Wednesday appt w/ Dr. Scot Dock, Carotid D#  [facility] [place]: [phone]  [facility] [place]: [phone]  [facility] [place]: [phone]  You would probably need to speak to the triage nurse.  ----- Message ----- From: Mena Goes, RN Sent: 06/10/2017   3:10 PM To: Georgiann Mccoy Subject: RE: Wednesday appt w/ Dr. Scot Dock, Carotid D#  Get me the number and office name of who I need to speak with. ----- Message ----- From: Georgiann Mccoy Sent: 06/10/2017   2:16 PM To: Mena Goes, RN Subject: RE: Wednesday appt w/ Dr. Scot Dock, Carotid D#  I checked with all of them.  ----- Message ----- From: Mena Goes, RN Sent: 06/10/2017   2:00 PM To: Georgiann Mccoy Subject: RE: Wednesday appt w/ Dr. Scot Dock, Carotid D#  Which ones did you check with? She may have to travel to [facility] if [place] can't see her.  ----- Message ----- From: Georgiann Mccoy Sent: 06/10/2017   1:25 PM To: Mena Goes, RN Subject: RE: Wednesday appt w/ Dr. Scot Dock, Carotid D#  No cardiologist office has an availabilities until the end of Sept. You or dr. Scot Dock would need to call for them to overbook.  -----  Message ----- From: Mena Goes, RN Sent: 06/10/2017  12:52 PM To: [facility] Admin Pool Subject: Wednesday appt w/ Dr. Scot Dock, Carotid Duple#    ----- Message ----- From: Angelia Mould, MD Sent: 06/10/2017  12:23 PM To: Vvs Charge Pool  PROCEDURE:   1. Ultrasound-guided access to the right common femoral artery 2. Aortogram with bilateral iliac arteriogram 3. Selective catheterization of the left external iliac artery with left lower extremity runoff 4. Drug coated balloon angioplasty of the left external iliac artery (5 mm x 4 cm balloon) 5. Retrograde right femoral arteriogram  SURGEON: Judeth Cornfield. Scot Dock, MD, FACS  This patient needs a left common femoral artery endarterectomy but will need preoperative cardiac clearance. She will also need an office visit to discuss this and also needs a carotid duplex at that time because of a left carotid bruit. I could potentially see her next Wednesday. CD

## 2017-06-11 NOTE — Progress Notes (Addendum)
PROGRESS NOTE    Sherri Brooks  WNI:[id number] DOB: [birth year] DOA: 06/10/2017 PCP: Sherri Samples, PA-C (Confirm with patient/family/NH records and if not entered, this HAS to be entered at [facility] point of entry. "No PCP" if truly none.)   Brief Narrative: (Start on day 1 of progress note - keep it brief and live) 71 y.o. female with medical history significant hypertension, diabetes, peripheral vascular disease status post left femoral to below knee popliteal artery bypass grafting 2011, hyperlipidemia, GERD, CAD underwent angiogram 8/27 and developed dyspnea.    She was given 40 mg lasix IV, O2 supplementation and labetalol for elevated pressures as well.  Admitted to [facility] with cardiology consult.     Assessment & Plan:   Principal Problem:   Dyspnea Active Problems:   Atherosclerosis of native arteries of the extremities with ulceration (HCC)   Diabetes mellitus with complication (HCC)   Tobacco use disorder   Anemia of unknown etiology   Acute kidney injury ([place])   Essential hypertension   PVD (peripheral vascular disease) ([place])   #1. Dyspnea, thought 2/2 Possible COPD and Volume overload:  Likely multifactorial specifically mild exacerbation of undiagnosed COPD in setting of possible volume overload during angiogram and anemia. May have had "flash" pulmonary edema with elevated BP's requiring labetalol.  EKG without acute changes and her O2 sats were normal on 3 L.  She received 40 mg of Lasix with markedly improvement.  She also received labetalol for hypertension.  She is [name] smoker.  Improved now, generally asymptomatic feeling back to baseline.  - appreciate cards recs - CXR without remarkable findings. - Scheduled nebs - Cycle troponins per cardiology - elevated, see below - Echocardiogram per cards - EF 38-18%, grade 2 diastolic dysfunction (see report) - Oxygen supplementation as needed - S/p 1 unit pRBC - Defer diuresis to cardiology - Recommend outpatient pulmonary  function tests  #2. Troponemia:  Likely demand ischemia in setting of volume overload, elevated pro BNP yesterday.      - will continue to trend trops till downtrending - echo with grade 2 diastolic dysfunction, EF 29-93% [ ]  cardiology planning for cath once her renal function is stable   #3 Peripheral vascular disease: Planning for L common femoral artery endarterectomy.  - ASA, plavix  # 4. Acute kidney injury. Baseline recently maybe 1.2?  Up to 1.8, with contrast and possible volume overload as noted above (vs also received lasix).  Appears euvolemic. - Holding lasix per cards and d/c HCTZ - Hold nephrotoxins - Monitor urine output - daily BMP  #5 Hypokalemia: Replete prn  #6. Hypertension. Poor control, meds include hydrochlorothiazide and labetalol. -hold HCTZ as above -continue labetalol -Monitor -When necessary hydralazine  #7. Anemia. Hemoglobin 7.5, H/H dropped after transfusion, inappropriate.  Possibly some blood loss from procedure, but impressive iron deficiency anemia as well? (normal about 1 year ago.  04/2016 H/H 12.9/38). - with PM H/H downtrending, will transfuse additional unit of pRBC (2nd unit over hospital course).  Risk/benefits discussed. - transfuse for < 7   - FOBT pending - Anemia panel suggestive of iron def anemia (normal B12/folate), could consider iron transfusion after H/H stabilizes.    5. Diabetes. Serum glucose 144 on admission. Home medications include Lantus 30 units at bedtime -lantus 20 units nightly, increase prn -Carb modified diet -Obtain [name] hemoglobin A1c 6.2 -Sliding scale insulin for optimal control  #6. Tobacco use. -Cessation counseling  DVT prophylaxis: SCD's, consider resuming pharm prophylaxis if H/H stabilizes Code Status:  full  Family Communication: none Disposition Plan: pending improvement in kidney function, planned cath by cards   Consultants:   Cardiology  vascular  Procedures: (Don't include imaging  studies which can be auto populated. Include things that cannot be auto populated i.e. Echo, Carotid and venous dopplers, Foley, Bipap, HD, tubes/drains, wound vac, central lines etc)  Echo, EF 71-24%, diastolic dysfunction (grade 2) (see report)  8/27 with vascular - US guided access to R common femoral artery, Aortogram with bilateral iliac arteriogram, selective catherization of L exteral iliac aftery with LL extremity runoff; angioplasty of L external iliac artery, retrograde R femoral arteriogram (see report)  Antimicrobials: (specify start and planned stop date. Auto populated tables are space occupying and do not give end dates)  none   Subjective: Getting echo.  Feels better than yesterday.  Essentially feeling back to baseline.  Objective: Vitals:   06/11/17 0700 06/11/17 0845 06/11/17 0911 06/11/17 1141  BP: (!) 140/52   (!) 149/44  Pulse:    85  Resp: 19 15  20   Temp:  98.3 F (36.8 C)  98.4 F (36.9 C)  TempSrc:  Oral  Oral  SpO2:  100% 98% 100%  Weight:      Height:        Intake/Output Summary (Last 24 hours) at 06/11/17 1252 Last data filed at 06/11/17 0806  Gross per 24 hour  Intake              911 ml  Output                1 ml  Net              910 ml   Filed Weights   06/10/17 0647 06/11/17 0401  Weight: 74.8 kg (165 lb) 76 kg (167 lb 8.8 oz)    Examination:  General exam: Appears calm and comfortable  Respiratory system: Clear to auscultation. Respiratory effort normal. Cardiovascular system: S1 & S2 heard, RRR. No JVD, murmurs, rubs, gallops or clicks. No pedal edema. Gastrointestinal system: Abdomen is nondistended, soft and nontender. No organomegaly or masses felt. Normal bowel sounds heard. Central nervous system: Alert and oriented. No focal neurological deficits. Extremities: L partial foot amputation.  Faintly palpable distal pulses.  No LEE. Skin: warm and dry Psychiatry: Judgement and insight appear normal. Mood & affect appropriate.      Data Reviewed: I have personally reviewed following labs and imaging studies  CBC:  Recent Labs Lab 06/10/17 0710 06/10/17 1936 06/11/17 0447  WBC  --  8.3 8.4  HGB 7.5* 7.8* 7.3*  HCT 22.0* 25.1* 22.7*  MCV  --  77.0* 75.7*  PLT  --  376 580   Basic Metabolic Panel:  Recent Labs Lab 06/10/17 0710 06/10/17 1936 06/11/17 0447  NA 142 137 137  K 3.9 3.4* 3.4*  CL 109 107 106  CO2  --  19* 21*  GLUCOSE 147* 188* 236*  BUN 20 24* 34*  CREATININE 1.40* 1.56* 1.80*  CALCIUM  --  8.9 8.8*   GFR: Estimated Creatinine Clearance: 27.6 mL/min (Krystofer Hevener) (by C-G formula based on SCr of 1.8 mg/dL (H)). Liver Function Tests:  Recent Labs Lab 06/10/17 1936  AST 32  ALT 18  ALKPHOS 67  BILITOT 0.9  PROT 7.7  ALBUMIN 3.3*   No results for input(s): LIPASE, AMYLASE in the last 168 hours. No results for input(s): AMMONIA in the last 168 hours. Coagulation Profile: No results for input(s): INR, PROTIME in  the last 168 hours. Cardiac Enzymes:  Recent Labs Lab 06/10/17 1936 06/10/17 2304 06/11/17 0447  TROPONINI 0.13* 0.20* 0.26*   BNP (last 3 results) No results for input(s): PROBNP in the last 8760 hours. HbA1C:  Recent Labs  06/10/17 1936  HGBA1C 6.2*   CBG:  Recent Labs Lab 06/10/17 1311 06/10/17 1627 06/10/17 2144 06/11/17 0600 06/11/17 1139  GLUCAP 189* 205* 286* 229* 291*   Lipid Profile: No results for input(s): CHOL, HDL, LDLCALC, TRIG, CHOLHDL, LDLDIRECT in the last 72 hours. Thyroid Function Tests: No results for input(s): TSH, T4TOTAL, FREET4, T3FREE, THYROIDAB in the last 72 hours. Anemia Panel:  Recent Labs  06/10/17 1936  VITAMINB12 757  FOLATE 28.0  FERRITIN 12  TIBC 550*  IRON 18*  RETICCTPCT 2.5   Sepsis Labs: No results for input(s): PROCALCITON, LATICACIDVEN in the last 168 hours.  No results found for this or any previous visit (from the past 240 hour(s)).       Radiology Studies: Dg Chest Port 1 View  Result  Date: 06/10/2017 CLINICAL DATA:  71 year old female with shortness of breath. EXAM: PORTABLE CHEST 1 VIEW COMPARISON:  11/03/2012 FINDINGS: The heart size and mediastinal contours are within normal limits. Both lungs are clear. The visualized skeletal structures are unremarkable. IMPRESSION: No active disease. Electronically Signed   By: Kristopher Oppenheim M.D.   On: 06/10/2017 13:44        Scheduled Meds: . [START ON 06/16/2017] alendronate  70 mg Oral Q7 days  . clopidogrel  75 mg Oral Daily  . hydrochlorothiazide  25 mg Oral Daily  . insulin aspart  0-5 Units Subcutaneous QHS  . insulin aspart  0-9 Units Subcutaneous TID WC  . insulin glargine  15 Units Subcutaneous QHS  . labetalol  200 mg Oral BID  . LORazepam  0.5 mg Oral QHS  . mouth rinse  15 mL Mouth Rinse BID  . potassium chloride  40 mEq Oral Once  . simvastatin  40 mg Oral QHS  . sodium chloride flush  3 mL Intravenous Q12H   Continuous Infusions: . sodium chloride       LOS: 1 day    Time spent: 35 min    Filippo Puls Melven Sartorius, MD [facility] [phone]   If 7PM-7AM, please contact night-coverage [email or web address] Password [facility] 06/11/2017, 12:52 PM

## 2017-06-11 NOTE — Progress Notes (Addendum)
Progress Note  Patient Name: Sherri Brooks Date of Encounter: 06/11/2017  Primary Cardiologist: New/Dr Stanford Breed  Subjective   No chest pain; dyspnea resolved.  Inpatient Medications    Scheduled Meds: . [START ON 06/16/2017] alendronate  70 mg Oral Q7 days  . clopidogrel  75 mg Oral Daily  . hydrochlorothiazide  25 mg Oral Daily  . insulin aspart  0-5 Units Subcutaneous QHS  . insulin aspart  0-9 Units Subcutaneous TID WC  . insulin glargine  15 Units Subcutaneous QHS  . labetalol  200 mg Oral BID  . LORazepam  0.5 mg Oral QHS  . mouth rinse  15 mL Mouth Rinse BID  . potassium chloride  40 mEq Oral Once  . simvastatin  40 mg Oral QHS  . sodium chloride flush  3 mL Intravenous Q12H   Continuous Infusions: . sodium chloride     PRN Meds: sodium chloride, acetaminophen **OR** acetaminophen, albuterol, bisacodyl, hydrALAZINE, HYDROcodone-acetaminophen, labetalol, ondansetron **OR** ondansetron (ZOFRAN) IV, sodium chloride flush, traZODone   Vital Signs    Vitals:   06/11/17 0700 06/11/17 0845 06/11/17 0911 06/11/17 1141  BP: (!) 140/52   (!) 149/44  Pulse:    85  Resp: 19 15  20   Temp:  98.3 F (36.8 C)  98.4 F (36.9 C)  TempSrc:  Oral  Oral  SpO2:  100% 98% 100%  Weight:      Height:        Intake/Output Summary (Last 24 hours) at 06/11/17 1257 Last data filed at 06/11/17 0806  Gross per 24 hour  Intake              911 ml  Output                1 ml  Net              910 ml   Filed Weights   06/10/17 0647 06/11/17 0401  Weight: 74.8 kg (165 lb) 76 kg (167 lb 8.8 oz)    Telemetry    Sinus- Personally Reviewed   Physical Exam   GEN: No acute distress.   Neck: No JVD Cardiac: RRR, no murmurs, rubs, or gallops.  Respiratory: Clear to auscultation bilaterally. GI: Soft, nontender, non-distended  MS: No edema; No deformity. Neuro:  Nonfocal  Psych: Normal affect   Labs    Chemistry Recent Labs Lab 06/10/17 0710 06/10/17 1936  06/11/17 0447  NA 142 137 137  K 3.9 3.4* 3.4*  CL 109 107 106  CO2  --  19* 21*  GLUCOSE 147* 188* 236*  BUN 20 24* 34*  CREATININE 1.40* 1.56* 1.80*  CALCIUM  --  8.9 8.8*  PROT  --  7.7  --   ALBUMIN  --  3.3*  --   AST  --  32  --   ALT  --  18  --   ALKPHOS  --  67  --   BILITOT  --  0.9  --   GFRNONAA  --  32* 27*  GFRAA  --  37* 31*  ANIONGAP  --  11 10     Hematology Recent Labs Lab 06/10/17 0710 06/10/17 1936 06/11/17 0447  WBC  --  8.3 8.4  RBC  --  3.26*  3.29* 3.00*  HGB 7.5* 7.8* 7.3*  HCT 22.0* 25.1* 22.7*  MCV  --  77.0* 75.7*  MCH  --  23.9* 24.3*  MCHC  --  31.1 32.2  RDW  --  15.6* 16.2*  PLT  --  376 339    Cardiac Enzymes Recent Labs Lab 06/10/17 1936 06/10/17 2304 06/11/17 0447  TROPONINI 0.13* 0.20* 0.26*    BNP Recent Labs Lab 06/10/17 1936  BNP 462.3*     Radiology    Dg Chest Port 1 View  Result Date: 06/10/2017 CLINICAL DATA:  71 year old female with shortness of breath. EXAM: PORTABLE CHEST 1 VIEW COMPARISON:  11/03/2012 FINDINGS: The heart size and mediastinal contours are within normal limits. Both lungs are clear. The visualized skeletal structures are unremarkable. IMPRESSION: No active disease. Electronically Signed   By: Kristopher Oppenheim M.D.   On: 06/10/2017 13:44    Patient Profile   71 year old female with past medical history of diabetes mellitus, peripheral vascular disease, hypertension, question myocardial infarction who underwent angiogram 8/27 and we were asked to evaluate for sudden onset dyspnea and hypertension. Following her arteriogram she became abruptly short of breath. She received approximately 500 cc of IV fluids during the procedure. She improved with IV lasix. Note her hemoglobin prior to the procedure was noted to be 7.5. Electrocardiogram showed sinus rhythm, left ventricular hypertrophy, increased ST depression in the inferolateral leads.  Assessment & Plan    1 acute dyspnea-Etiology unclear.  She did receive 500 cc of saline which could have contributed; she was also hypertensive. However she developed pulmonary edema on examination, has multiple risk factors and documented vascular disease. She also will require peripheral vascular surgery. Her troponin is minimally elevated. I think definitive evaluation is warranted. We will plan cardiac catheterization once her renal function is stable. The risks and benefits including myocardial infarctions, CVA and death were discussed and she agrees to proceed. I also explained the risk of contrast nephropathy. Await results of echocardiogram. Continue Plavix and statin.  2 hypertension-continue present medications and follow.  3 anemia-further evaluation per primary care.  4 peripheral vascular disease-continue plavix and statin. Management per vascular surgery.   5 acute on chronic stage III kidney disease-likely contribution from contrast and also Lasix yesterday. Hold on further diuresis; DC HCTZ. Follow renal function. Plan catheterization once renal function more stable.  Signed, Kirk Ruths, MD  06/11/2017, 12:57 PM

## 2017-06-11 NOTE — Progress Notes (Signed)
Inpatient Diabetes Program Recommendations  AACE/ADA: New Consensus Statement on Inpatient Glycemic Control (2015)  Target Ranges:  Prepandial:   less than 140 mg/dL      Peak postprandial:   less than 180 mg/dL (1-2 hours)      Critically ill patients:  140 - 180 mg/dL   Results for SHEKIA, KUPER (MRN [id number]) as of 06/11/2017 09:43  Ref. Range 06/10/2017 13:11 06/10/2017 16:27 06/10/2017 21:44 06/11/2017 06:00  Glucose-Capillary Latest Ref Range: 65 - 99 mg/dL 189 (H) 205 (H) 286 (H) 229 (H)   Review of Glycemic Control  Diabetes history: DM 2 Outpatient Diabetes medications: Lantus 30 units Current orders for Inpatient glycemic control: Lantus 15 units, Novolog Sensitive 0-9 units tid + Novolog HS scale   A1c 6.2% on 06/10/17  Inpatient Diabetes Program Recommendations:    Glucose trends increased. Patient started on lower dose of basal insulin. Please consider increasing Lantus to 20 units QHS.  Thanks,  Tama Headings RN, MSN, [facility] Inpatient Diabetes Coordinator Team Pager [phone] (8a-5p)

## 2017-06-11 NOTE — Progress Notes (Signed)
   VASCULAR SURGERY ASSESSMENT & PLAN:   S/P PTA left EIA  Post op SOB. Now with elevated troponins. Cardiology following. ECHO pending.   Eventually, she can be considered for left common femoral artery endarterectomy however currently given her elevated troponins and her respiratory event yesterday I think we need to get her through this before considering elective surgery.  In addition, when she was seen in the office she had a left carotid bruit. For this reason I will order a carotid duplex scan. I will also order vein mapping of her right lower extremity.  She has had 2 previous bypass's in the left leg using PTFE. The great saphenous vein on the left was not adequate. I would favor a local endarterectomy on the left but if the wound failed to heal she could potentially require a third time redo bypass using vein from the right leg if this were adequate.  ANEMIA: Hemoglobin is 7.3. May need transfusion but we'll leave this decision to the hospitalist. Her hemoglobin was 7.8 preop.  CHRONIC KIDNEY DISEASE: Her creatinine is 1.8. It was 1.56 preop.  SUBJECTIVE:   No chest pain or shortness of breath this morning. Vitals:   06/11/17 0016 06/11/17 0401 06/11/17 0700 06/11/17 0845  BP: (!) 143/42 (!) 142/38 (!) 140/52   Pulse:      Resp: 19 19 19 15   Temp: 98.3 F (36.8 C) 99.1 F (37.3 C)  98.3 F (36.8 C)  TempSrc: Oral Oral  Oral  SpO2:    100%  Weight:  167 lb 8.8 oz (76 kg)    Height:       Both feet are warm and well perfused.   right groin site looks fine.  LABS:   Lab Results  Component Value Date   WBC 8.4 06/11/2017   HGB 7.3 (L) 06/11/2017   HCT 22.7 (L) 06/11/2017   MCV 75.7 (L) 06/11/2017   PLT 339 06/11/2017   Lab Results  Component Value Date   CREATININE 1.80 (H) 06/11/2017   Lab Results  Component Value Date   INR 1.00 11/03/2012   CBG (last 3)   Recent Labs  06/10/17 1627 06/10/17 2144 06/11/17 0600  GLUCAP 205* 286* 229*    PROBLEM  LIST:    Principal Problem:   Dyspnea Active Problems:   Atherosclerosis of native arteries of the extremities with ulceration (HCC)   Diabetes mellitus with complication (HCC)   Tobacco use disorder   Anemia of unknown etiology   Acute kidney injury ([place])   Essential hypertension   PVD (peripheral vascular disease) (HCC)   CURRENT MEDS:   . albuterol  2.5 mg Nebulization TID  . [START ON 06/16/2017] alendronate  70 mg Oral Q7 days  . clopidogrel  75 mg Oral Daily  . hydrochlorothiazide  25 mg Oral Daily  . insulin aspart  0-5 Units Subcutaneous QHS  . insulin aspart  0-9 Units Subcutaneous TID WC  . insulin glargine  15 Units Subcutaneous QHS  . labetalol  200 mg Oral BID  . LORazepam  0.5 mg Oral QHS  . mouth rinse  15 mL Mouth Rinse BID  . simvastatin  40 mg Oral QHS  . sodium chloride flush  3 mL Intravenous Q12H    Gae Gallop Beeper: [phone] Office: [phone] 06/11/2017

## 2017-06-11 NOTE — Progress Notes (Signed)
  Echocardiogram 2D Echocardiogram has been performed.  Donata Clay 06/11/2017, 1:02 PM

## 2017-06-12 ENCOUNTER — Encounter: Payer: Self-pay | Admitting: Vascular Surgery

## 2017-06-12 ENCOUNTER — Inpatient Hospital Stay (HOSPITAL_COMMUNITY): Payer: Medicare HMO

## 2017-06-12 DIAGNOSIS — R0989 Other specified symptoms and signs involving the circulatory and respiratory systems: Secondary | ICD-10-CM

## 2017-06-12 DIAGNOSIS — I739 Peripheral vascular disease, unspecified: Secondary | ICD-10-CM

## 2017-06-12 LAB — BASIC METABOLIC PANEL
ANION GAP: 10 (ref 5–15)
BUN: 40 mg/dL — ABNORMAL HIGH (ref 6–20)
CHLORIDE: 110 mmol/L (ref 101–111)
CO2: 20 mmol/L — AB (ref 22–32)
Calcium: 8.8 mg/dL — ABNORMAL LOW (ref 8.9–10.3)
Creatinine, Ser: 1.65 mg/dL — ABNORMAL HIGH (ref 0.44–1.00)
GFR calc Af Amer: 35 mL/min — ABNORMAL LOW (ref >60)
GFR, EST NON AFRICAN AMERICAN: 30 mL/min — AB (ref >60)
GLUCOSE: 112 mg/dL — AB (ref 65–99)
POTASSIUM: 3.8 mmol/L (ref 3.5–5.1)
Sodium: 140 mmol/L (ref 135–145)

## 2017-06-12 LAB — VAS US CAROTID
LEFT ECA DIAS: -13 cm/s
LICAPDIAS: -10 cm/s
LICAPSYS: -102 cm/s
Left CCA dist dias: 15 cm/s
Left CCA dist sys: 101 cm/s
Left CCA prox dias: 16 cm/s
Left CCA prox sys: 104 cm/s
Left ICA dist dias: -16 cm/s
Left ICA dist sys: -80 cm/s
RCCAPDIAS: -11 cm/s
RIGHT ECA DIAS: -3 cm/s
Right CCA prox sys: -154 cm/s
Right cca dist sys: -90 cm/s

## 2017-06-12 LAB — CBC
HEMATOCRIT: 26.6 % — AB (ref 36.0–46.0)
Hemoglobin: 8.7 g/dL — ABNORMAL LOW (ref 12.0–15.0)
MCH: 25.8 pg — ABNORMAL LOW (ref 26.0–34.0)
MCHC: 32.7 g/dL (ref 30.0–36.0)
MCV: 78.9 fL (ref 78.0–100.0)
Platelets: 311 10*3/uL (ref 150–400)
RBC: 3.37 MIL/uL — ABNORMAL LOW (ref 3.87–5.11)
RDW: 17.1 % — AB (ref 11.5–15.5)
WBC: 11 10*3/uL — ABNORMAL HIGH (ref 4.0–10.5)

## 2017-06-12 LAB — GLUCOSE, CAPILLARY
GLUCOSE-CAPILLARY: 124 mg/dL — AB (ref 65–99)
GLUCOSE-CAPILLARY: 229 mg/dL — AB (ref 65–99)
GLUCOSE-CAPILLARY: 161 mg/dL — AB (ref 65–99)
Glucose-Capillary: 221 mg/dL — ABNORMAL HIGH (ref 65–99)

## 2017-06-12 MED ORDER — LABETALOL HCL 200 MG PO TABS
300.0000 mg | ORAL_TABLET | Freq: Two times a day (BID) | ORAL | Status: DC
  Administered 2017-06-12 – 2017-06-15 (×7): 300 mg via ORAL
  Filled 2017-06-12: qty 1
  Filled 2017-06-12 (×2): qty 2
  Filled 2017-06-12 (×4): qty 1

## 2017-06-12 NOTE — Progress Notes (Signed)
PROGRESS NOTE    Sherri Brooks  YNW:[id number] DOB: 1946/05/16 DOA: 06/10/2017 PCP: Jake Samples, PA-C    Brief Narrative:  71 y.o. female with medical history significant hypertension, diabetes, peripheral vascular disease status post left femoral to below knee popliteal artery bypass grafting 2011, hyperlipidemia, GERD, CAD underwent angiogram 8/27 and developed dyspnea.    She was given 40 mg lasix IV, O2 supplementation and labetalol for elevated pressures as well.  Currently breathing comfortably on room air   Assessment & Plan:   Principal Problem:   Dyspnea Active Problems:   Atherosclerosis of native arteries of the extremities with ulceration (HCC)   Diabetes mellitus with complication ([place])   Tobacco use disorder   Anemia of unknown etiology   Acute kidney injury ([place])   Essential hypertension   PVD (peripheral vascular disease) ([place])   #1. Dyspnea, thought 2/2 Possible COPD and Volume verload:  Likely multifactorial specifically mild exacerbation of undiagnosed COPD in setting of possible volume overload during angiogram and anemia. May have had "flash" pulmonary edema with elevated BP's requiring labetalol.  EKG without acute changes and her O2 sats were normal on 3 L.  She received 40 mg of Lasix with markedly improvement.  She also received labetalol for hypertension.  She is a smoker.  Improved now, generally asymptomatic feeling back to baseline.  - appreciate cards recs - CXR without remarkable findings. - Scheduled nebs - Cycle troponins per cardiology - elevated, see below - Echocardiogram per cards - EF 65-78%, grade 2 diastolic dysfunction (see report) - Defer diuresis to cardiology - Recommend outpatient pulmonary function tests - Patient is currently breathing comfortably on room air  #2. Troponemia:  Likely demand ischemia in setting of volume overload, elevated pro BNP. - echo with grade 2 diastolic dysfunction, EF 46-96% - cardiology planning  for cath    #3 Peripheral vascular disease: Planning for L common femoral artery endarterectomy.  - ASA, plavix  # 4. Acute kidney injury. Baseline recently maybe 1.2?  Up to 1.8, with contrast and possible volume overload as noted above (vs also received lasix).  Appears euvolemic. - Holding lasix per cards and d/c HCTZ  #5 Hypokalemia: WNL on last check.  #6. Hypertension. Poor control, meds include hydrochlorothiazide and labetalol. -hold HCTZ as above -continue labetalol -Monitor -hydralazine PRN on board.  #7. Anemia. Hemoglobin 7.5, H/H dropped after transfusion, inappropriate.  Possibly some blood loss from procedure, but impressive iron deficiency anemia as well? (normal about 1 year ago.  04/2016 H/H 12.9/38). - with PM H/H downtrending, will transfuse additional unit of pRBC (2nd unit over hospital course).  Risk/benefits discussed. - transfuse for < 7   - FOBT pending - Anemia panel suggestive of iron def anemia (normal B12/folate), could consider iron transfusion after H/H stabilizes.   - reassess cbc next am.  5. Diabetes. Serum glucose 144 on admission. Home medications include Lantus 30 units at bedtime -lantus 20 units nightly, increase prn -Carb modified diet -Obtain a hemoglobin A1c 6.2 -Sliding scale insulin for optimal control  #6. Tobacco use. -Cessation counseling  DVT prophylaxis: SCD's, consider resuming pharm prophylaxis if H/H stabilizes Code Status: full  Family Communication: none Disposition Plan: pending improvement in kidney function, planned cath by cards   Consultants:   Cardiology  vascular  Procedures:   Echo, EF 29-52%, diastolic dysfunction (grade 2) (see report)  8/27 with vascular - US guided access to R common femoral artery, Aortogram with bilateral iliac arteriogram, selective catherization of L  exteral iliac aftery with LL extremity runoff; angioplasty of L external iliac artery, retrograde R femoral arteriogram (see  report)  Antimicrobials:   none   Subjective: States that breathing condition improved.  Objective: Vitals:   06/12/17 0100 06/12/17 0400 06/12/17 0800 06/12/17 1119  BP: (!) 161/64 (!) 142/54 (!) 145/54 (!) 172/55  Pulse:    77  Resp: 16 20    Temp:  98.7 F (37.1 C) 98 F (36.7 C) 98.3 F (36.8 C)  TempSrc:  Oral Oral Oral  SpO2:  100% 100% 100%  Weight:  75.8 kg (167 lb 1.6 oz)    Height:        Intake/Output Summary (Last 24 hours) at 06/12/17 1548 Last data filed at 06/12/17 1230  Gross per 24 hour  Intake             1275 ml  Output                0 ml  Net             1275 ml   Filed Weights   06/10/17 0647 06/11/17 0401 06/12/17 0400  Weight: 74.8 kg (165 lb) 76 kg (167 lb 8.8 oz) 75.8 kg (167 lb 1.6 oz)    Examination:  General exam: Appears calm and comfortable, in nad. Respiratory system: Clear to auscultation. Respiratory effort normal. Equal chest rise. Cardiovascular system: S1 & S2 heard, RRR. No JVD, murmurs, rubs, gallops or clicks. No pedal edema. Gastrointestinal system: Abdomen is nondistended, soft and nontender. No organomegaly or masses felt. Normal bowel sounds heard. Central nervous system: Alert and oriented. No focal neurological deficits. Extremities: L partial foot amputation.  Faintly palpable distal pulses.  No LEE. Skin: warm and dry Psychiatry: Judgement and insight appear normal. Mood & affect appropriate.     Data Reviewed:   CBC:  Recent Labs Lab 06/10/17 0710 06/10/17 1936 06/11/17 0447 06/11/17 1619 06/12/17 0416  WBC  --  8.3 8.4  --  11.0*  HGB 7.5* 7.8* 7.3* 7.1* 8.7*  HCT 22.0* 25.1* 22.7* 22.3* 26.6*  MCV  --  77.0* 75.7*  --  78.9  PLT  --  376 339  --  932   Basic Metabolic Panel:  Recent Labs Lab 06/10/17 0710 06/10/17 1936 06/11/17 0447 06/12/17 0416  NA 142 137 137 140  K 3.9 3.4* 3.4* 3.8  CL 109 107 106 110  CO2  --  19* 21* 20*  GLUCOSE 147* 188* 236* 112*  BUN 20 24* 34* 40*    CREATININE 1.40* 1.56* 1.80* 1.65*  CALCIUM  --  8.9 8.8* 8.8*   GFR: Estimated Creatinine Clearance: 30.1 mL/min (A) (by C-G formula based on SCr of 1.65 mg/dL (H)). Liver Function Tests:  Recent Labs Lab 06/10/17 1936  AST 32  ALT 18  ALKPHOS 67  BILITOT 0.9  PROT 7.7  ALBUMIN 3.3*   No results for input(s): LIPASE, AMYLASE in the last 168 hours. No results for input(s): AMMONIA in the last 168 hours. Coagulation Profile: No results for input(s): INR, PROTIME in the last 168 hours. Cardiac Enzymes:  Recent Labs Lab 06/10/17 1936 06/10/17 2304 06/11/17 0447 06/11/17 1152 06/11/17 1619  TROPONINI 0.13* 0.20* 0.26* 0.27* 0.25*   BNP (last 3 results) No results for input(s): PROBNP in the last 8760 hours. HbA1C:  Recent Labs  06/10/17 1936  HGBA1C 6.2*   CBG:  Recent Labs Lab 06/11/17 1553 06/11/17 2007 06/11/17 2118 06/12/17 3557 06/12/17 1119  GLUCAP 248* 243* 230* 124* 229*   Lipid Profile: No results for input(s): CHOL, HDL, LDLCALC, TRIG, CHOLHDL, LDLDIRECT in the last 72 hours. Thyroid Function Tests: No results for input(s): TSH, T4TOTAL, FREET4, T3FREE, THYROIDAB in the last 72 hours. Anemia Panel:  Recent Labs  06/10/17 1936  VITAMINB12 757  FOLATE 28.0  FERRITIN 12  TIBC 550*  IRON 18*  RETICCTPCT 2.5   Sepsis Labs: No results for input(s): PROCALCITON, LATICACIDVEN in the last 168 hours.  No results found for this or any previous visit (from the past 240 hour(s)).       Radiology Studies: No results found.      Scheduled Meds: . [START ON 06/16/2017] alendronate  70 mg Oral Q7 days  . clopidogrel  75 mg Oral Daily  . heparin subcutaneous  5,000 Units Subcutaneous Q8H  . insulin aspart  0-5 Units Subcutaneous QHS  . insulin aspart  0-9 Units Subcutaneous TID WC  . insulin glargine  20 Units Subcutaneous QHS  . labetalol  300 mg Oral BID  . LORazepam  0.5 mg Oral QHS  . mouth rinse  15 mL Mouth Rinse BID  .  simvastatin  40 mg Oral QHS  . sodium chloride flush  3 mL Intravenous Q12H   Continuous Infusions: . sodium chloride    . sodium chloride       LOS: 2 days    Time spent: 35 min  Velvet Bathe, MD [facility] [phone]   If 7PM-7AM, please contact night-coverage [email or web address] Password [facility] 06/12/2017, 3:48 PM

## 2017-06-12 NOTE — Progress Notes (Signed)
VASCULAR LAB PRELIMINARY  PRELIMINARY  PRELIMINARY  PRELIMINARY  Carotid duplex completed.    Preliminary report:  Right :  40-59% internal carotid artery stenosis.  Left : 1% to 39% ICA stenosis. Bilateral vertebral artery flow could not be insonated due to movement and coughing  Twinkle Sockwell, RVS 06/12/2017, 1:53 PM

## 2017-06-12 NOTE — Progress Notes (Signed)
Left Lower Extremity Vein Map    Left Great Saphenous Vein   Segment Diameter Comment  1. Origin mm Not visualized  2. High Thigh mm Not visualized  3. Mid Thigh mm Not visualized  4. Low Thigh mm Not visualized  5. At Knee mm Not visualized  6. High Calf mm Not visualized  7. Low Calf mm Not visualized  8. Ankle 1.6 mm Multiple branches Patent and compressible   All segments imaged were patent and compressible.  Left Small Saphenous Vein  Segment Diameter Comment  1. Origin 3.6 mm   2. High Calf 4.0 mm   3. Low Calf 3.0 mm Branch  4. Ankle 3.1 mm    All segments imaged were patent and compressible  [facility], RVS 06/12/2017, 2:32 PM

## 2017-06-12 NOTE — Progress Notes (Signed)
VASCULAR SURGERY ASSESSMENT & PLAN:   CARDIAC: Cardiology plans catheterization once her renal function is stable. Her echo showed an EF of 65-70%. She had evidence of grade 2 diastolic dysfunction.   LEFT CAROTID BRUIT: I had planned on obtaining a carotid duplex scan at her next office visit given her left carotid bruit but we'll get this while she is in the hospital.  MULTILEVEL ARTERIAL OCCLUSIVE DISEASE WITH NONHEALING WOUND LEFT FOOT: She has undergone angioplasty of her left external iliac artery stenosis with a good result. However she has a tight focal stenosis in her proximal deep femoral artery which could be addressed with endarterectomy and patch angioplasty once she is medically stable. She has had 2 bypasses in the left leg which are chronically occluded. These were done with PTFE as her vein in the left leg was not adequate. I will obtain a vein map of the right leg given the small chance that we would need to consider a redo bypass to the below-knee popliteal artery with vein from the contralateral leg if the wound does not heal with endarterectomy and patch angioplasty. However if the vein is not adequate on the right I do not think it would be worth putting her through the risk of a bypass given that 2 previous prosthetic bypasses have not lasted long.  ANEMIA: Hemoglobin is 8.7. She was transfused 1 unit of blood.  CHRONIC KIDNEY DISEASE: Her creatinine is slightly improved today at 1.65.  SUBJECTIVE:   Resting comfortably  PHYSICAL EXAM:   Vitals:   06/11/17 2049 06/11/17 2336 06/12/17 0100 06/12/17 0400  BP: (!) 169/65 (!) 146/49 (!) 161/64 (!) 142/54  Pulse: 86     Resp:   16 20  Temp: 98 F (36.7 C) 98.3 F (36.8 C)  98.7 F (37.1 C)  TempSrc: Tympanic Oral  Oral  SpO2:  100%  100%  Weight:    167 lb 1.6 oz (75.8 kg)  Height:       Feet are warm.  LABS:   Lab Results  Component Value Date   WBC 11.0 (H) 06/12/2017   HGB 8.7 (L) 06/12/2017   HCT 26.6  (L) 06/12/2017   MCV 78.9 06/12/2017   PLT 311 06/12/2017   Lab Results  Component Value Date   CREATININE 1.65 (H) 06/12/2017   Lab Results  Component Value Date   INR 1.00 11/03/2012   CBG (last 3)   Recent Labs  06/11/17 2007 06/11/17 2118 06/12/17 0643  GLUCAP 243* 230* 124*    PROBLEM LIST:    Principal Problem:   Dyspnea Active Problems:   Atherosclerosis of native arteries of the extremities with ulceration (HCC)   Diabetes mellitus with complication (HCC)   Tobacco use disorder   Anemia of unknown etiology   Acute kidney injury ([place])   Essential hypertension   PVD (peripheral vascular disease) ([place])   CURRENT MEDS:   . [START ON 06/16/2017] alendronate  70 mg Oral Q7 days  . clopidogrel  75 mg Oral Daily  . heparin subcutaneous  5,000 Units Subcutaneous Q8H  . insulin aspart  0-5 Units Subcutaneous QHS  . insulin aspart  0-9 Units Subcutaneous TID WC  . insulin glargine  20 Units Subcutaneous QHS  . labetalol  200 mg Oral BID  . LORazepam  0.5 mg Oral QHS  . mouth rinse  15 mL Mouth Rinse BID  . simvastatin  40 mg Oral QHS  . sodium chloride flush  3 mL Intravenous Q12H  Gae Gallop Beeper: [phone] Office: [phone] 06/12/2017

## 2017-06-12 NOTE — Progress Notes (Signed)
Progress Note  Patient Name: Sherri Brooks Date of Encounter: 06/12/2017  Primary Cardiologist: Dr. Stanford Breed  Subjective   Breathing at baseline. She denies any chest pain or palpitations.   Inpatient Medications    Scheduled Meds: . [START ON 06/16/2017] alendronate  70 mg Oral Q7 days  . clopidogrel  75 mg Oral Daily  . heparin subcutaneous  5,000 Units Subcutaneous Q8H  . insulin aspart  0-5 Units Subcutaneous QHS  . insulin aspart  0-9 Units Subcutaneous TID WC  . insulin glargine  20 Units Subcutaneous QHS  . labetalol  200 mg Oral BID  . LORazepam  0.5 mg Oral QHS  . mouth rinse  15 mL Mouth Rinse BID  . simvastatin  40 mg Oral QHS  . sodium chloride flush  3 mL Intravenous Q12H   Continuous Infusions: . sodium chloride    . sodium chloride     PRN Meds: sodium chloride, acetaminophen **OR** acetaminophen, albuterol, bisacodyl, hydrALAZINE, HYDROcodone-acetaminophen, labetalol, ondansetron **OR** ondansetron (ZOFRAN) IV, sodium chloride flush, traZODone   Vital Signs    Vitals:   06/11/17 2049 06/11/17 2336 06/12/17 0100 06/12/17 0400  BP: (!) 169/65 (!) 146/49 (!) 161/64 (!) 142/54  Pulse: 86     Resp:   16 20  Temp: 98 F (36.7 C) 98.3 F (36.8 C)  98.7 F (37.1 C)  TempSrc: Tympanic Oral  Oral  SpO2:  100%  100%  Weight:    167 lb 1.6 oz (75.8 kg)  Height:        Intake/Output Summary (Last 24 hours) at 06/12/17 0810 Last data filed at 06/11/17 2336  Gross per 24 hour  Intake              675 ml  Output                0 ml  Net              675 ml   Filed Weights   06/10/17 0647 06/11/17 0401 06/12/17 0400  Weight: 165 lb (74.8 kg) 167 lb 8.8 oz (76 kg) 167 lb 1.6 oz (75.8 kg)    Telemetry    NSR, HR in 70's - 80's.  - Personally Reviewed  ECG    No new tracings.   Physical Exam   General: Well developed, well nourished African American female appearing in no acute distress. Head: Normocephalic, atraumatic.  Neck: Supple without  bruits, JVD not elevated. Lungs:  Resp regular and unlabored, CTA without wheezing or rales. Heart: RRR, S1, S2, no S3, S4, no rub. 2/6 systolic murmur along Apex.  Abdomen: Soft, non-tender, non-distended with normoactive bowel sounds. No hepatomegaly. No rebound/guarding. No obvious abdominal masses. Extremities: No clubbing, cyanosis, or lower extremity edema. Distal pedal pulses are 2+ bilaterally. Neuro: Alert and oriented X 3. Moves all extremities spontaneously. Psych: Normal affect.  Labs    Chemistry Recent Labs Lab 06/10/17 1936 06/11/17 0447 06/12/17 0416  NA 137 137 140  K 3.4* 3.4* 3.8  CL 107 106 110  CO2 19* 21* 20*  GLUCOSE 188* 236* 112*  BUN 24* 34* 40*  CREATININE 1.56* 1.80* 1.65*  CALCIUM 8.9 8.8* 8.8*  PROT 7.7  --   --   ALBUMIN 3.3*  --   --   AST 32  --   --   ALT 18  --   --   ALKPHOS 67  --   --   BILITOT 0.9  --   --  GFRNONAA 32* 27* 30*  GFRAA 37* 31* 35*  ANIONGAP 11 10 10      Hematology Recent Labs Lab 06/10/17 1936 06/11/17 0447 06/11/17 1619 06/12/17 0416  WBC 8.3 8.4  --  11.0*  RBC 3.26*  3.29* 3.00*  --  3.37*  HGB 7.8* 7.3* 7.1* 8.7*  HCT 25.1* 22.7* 22.3* 26.6*  MCV 77.0* 75.7*  --  78.9  MCH 23.9* 24.3*  --  25.8*  MCHC 31.1 32.2  --  32.7  RDW 15.6* 16.2*  --  17.1*  PLT 376 339  --  311    Cardiac Enzymes Recent Labs Lab 06/10/17 2304 06/11/17 0447 06/11/17 1152 06/11/17 1619  TROPONINI 0.20* 0.26* 0.27* 0.25*   No results for input(s): TROPIPOC in the last 168 hours.   BNP Recent Labs Lab 06/10/17 1936  BNP 462.3*     DDimer No results for input(s): DDIMER in the last 168 hours.   Radiology    Dg Chest Port 1 View  Result Date: 06/10/2017 CLINICAL DATA:  71 year old female with shortness of breath. EXAM: PORTABLE CHEST 1 VIEW COMPARISON:  11/03/2012 FINDINGS: The heart size and mediastinal contours are within normal limits. Both lungs are clear. The visualized skeletal structures are unremarkable.  IMPRESSION: No active disease. Electronically Signed   By: Kristopher Oppenheim M.D.   On: 06/10/2017 13:44    Cardiac Studies   Echocardiogram: 06/11/2017 Study Conclusions  - Left ventricle: The cavity size was normal. Wall thickness was   increased in a pattern of moderate LVH. Systolic function was   vigorous. The estimated ejection fraction was in the range of 65%   to 70%. Wall motion was normal; there were no regional wall   motion abnormalities. Features are consistent with a pseudonormal   left ventricular filling pattern, with concomitant abnormal   relaxation and increased filling pressure (grade 2 diastolic   dysfunction). - Mitral valve: Moderately calcified annulus. There was systolic   anterior motion. There was moderate regurgitation directed   posteriorly. - Pulmonary arteries: Systolic pressure was mildly increased.  Impressions:  - Compared to the prior study, there has been no significant   interval change.  Patient Profile     71 y.o. female w/ PMH of CAD, PVD, HTN, HLD, and Type 2 DM who presented to [facility] on 06/10/2017 for planned abdominal aortogram. Developed acute respiratory distress during the procedure with Cardiology being consulted for further evaluation.   Assessment & Plan    1. Acute Dyspnea/ Elevated Troponin - Etiology unclear but she did receive 500 cc of saline during her procedure and had a pre-procedural Hgb of 7.5. - developed pulmonary edema and responded well to IV Lasix. Cardiac enzymes have been cycled and found to be elevated at 0.20, 0.26, 0.27, and 0.25. Echo shows a preserved EF of 65-70% with no WMA.  - due to her multiple risk factors, known CAD, and needing future PV surgery, a cardiac catheterization will be pursued for definitive evaluation once Hgb and renal function stabilizes. Risks and benefits have been reviewed and she agrees to proceed.  - continue Plavix and statin.   2. HTN - BP variable at 137/42 - 169/66 in the  past 24 hours.  - currently on Labetalol 200mg  BID. Will increase to 300mg  BID.   3. Anemia - Hgb 7.3 on 8/29 and received 1 unit pRBC's. At 8.7 this AM.  - she denies any evidence of active bleeding.  - per admitting team.   4. PVD -  continue plavix and statin. - Vascular Surgery following.   5. Acute on chronic Stage 3 CKD - likely contribution from contrast and also IV Lasix.  - baseline 1.2 - 1.4. Peaked at 1.80 on 8/28 and trending down to 1.65 this AM.  - continue to hold HCTZ. Possible catheterization Thursday or Friday pending trend of Hgb and kidney function.   6. Mitral Regurgitation - moderate by echo this admission. Continue to follow in the outpatient setting.   Signed, Erma Heritage , PA-C 8:10 AM 06/12/2017 Pager: [phone] As above, patient seen and examined. She has not had any recurrent dyspnea and no chest pain. As outlined previously she has multiple risk factors, had acute pulmonary edema, mildly elevated troponin and history of vascular disease. She may also require vascular surgery. We will proceed with cardiac catheterization likely Friday if renal function stable. We will increase her labetalol for improved blood pressure control. Kirk Ruths, MD

## 2017-06-12 NOTE — Progress Notes (Signed)
Right Lower Extremity Vein Map    Right Great Saphenous Vein   Segment Diameter Comment  1. Origin  3.6 mm   2. High Thigh 2.5 mm   3. Mid Thigh 1.9 mm   4. Low Thigh 1.3 mm   5. At Knee mm Not visualized  6. High Calf mm Not visualized  7. Low Calf 1.9 mm   8. Ankle 2.2 mm    All segments imaged were patent and compressible.  Right Small Saphenous Vein  Segment Diameter Comment  1. Origin 3.0 mm   2. High Calf 2.7 mm   3. Low Calf 2.3 mm   4. Ankle 0.9 mm    All segmetns imaged were patent and compressible.  [facility], [place] 06/12/2017, 2:21 PM

## 2017-06-13 DIAGNOSIS — I739 Peripheral vascular disease, unspecified: Secondary | ICD-10-CM

## 2017-06-13 DIAGNOSIS — R0609 Other forms of dyspnea: Secondary | ICD-10-CM

## 2017-06-13 LAB — BASIC METABOLIC PANEL
Anion gap: 9 (ref 5–15)
BUN: 30 mg/dL — AB (ref 6–20)
CALCIUM: 8.5 mg/dL — AB (ref 8.9–10.3)
CO2: 21 mmol/L — ABNORMAL LOW (ref 22–32)
CREATININE: 1.54 mg/dL — AB (ref 0.44–1.00)
Chloride: 107 mmol/L (ref 101–111)
GFR calc Af Amer: 38 mL/min — ABNORMAL LOW (ref >60)
GFR, EST NON AFRICAN AMERICAN: 33 mL/min — AB (ref >60)
Glucose, Bld: 269 mg/dL — ABNORMAL HIGH (ref 65–99)
Potassium: 4 mmol/L (ref 3.5–5.1)
SODIUM: 137 mmol/L (ref 135–145)

## 2017-06-13 LAB — GLUCOSE, CAPILLARY
GLUCOSE-CAPILLARY: 279 mg/dL — AB (ref 65–99)
GLUCOSE-CAPILLARY: 170 mg/dL — AB (ref 65–99)
Glucose-Capillary: 101 mg/dL — ABNORMAL HIGH (ref 65–99)
Glucose-Capillary: 174 mg/dL — ABNORMAL HIGH (ref 65–99)

## 2017-06-13 LAB — PROTIME-INR
INR: 1.05
Prothrombin Time: 13.7 seconds (ref 11.4–15.2)

## 2017-06-13 MED ORDER — SODIUM CHLORIDE 0.9% FLUSH
3.0000 mL | INTRAVENOUS | Status: DC | PRN
Start: 2017-06-13 — End: 2017-06-14

## 2017-06-13 MED ORDER — SODIUM CHLORIDE 0.9 % IV SOLN
INTRAVENOUS | Status: DC
  Administered 2017-06-14: 10 mL/h via INTRAVENOUS

## 2017-06-13 MED ORDER — SODIUM CHLORIDE 0.9 % IV SOLN: 250.0000 mL | INTRAVENOUS | Status: DC | PRN

## 2017-06-13 MED ORDER — ASPIRIN 81 MG PO CHEW
81.0000 mg | CHEWABLE_TABLET | ORAL | Status: AC
  Administered 2017-06-14: 81 mg via ORAL
  Filled 2017-06-13: qty 1

## 2017-06-13 MED ORDER — SODIUM CHLORIDE 0.9% FLUSH
3.0000 mL | Freq: Two times a day (BID) | INTRAVENOUS | Status: DC
  Administered 2017-06-13 – 2017-06-14 (×2): 3 mL via INTRAVENOUS

## 2017-06-13 NOTE — Progress Notes (Signed)
   VASCULAR SURGERY ASSESSMENT & PLAN:   PERIPHERAL VASCULAR DISEASE WITH NONHEALING WOUND LEFT TMA: The patient had successful angioplasty of the left external iliac artery stenosis and she states that her foot feels better. She has a tight stenosis of the deep femoral artery proximally which was not amenable to angioplasty. She could potentially have endarterectomy and vein patch angioplasty of the stenosis. She's had 2 failed femoropopliteal bypass grafts both of which were done with PTFE. Her vein in the left leg was not adequate. I have mapped the vein in her right leg and this is not adequate to be used as a bypass conduit. This reason, I do not think there is any indication for a third attempt at femoropopliteal bypass grafting. Currently her cardiac issues more important and we can arrange surgery in the future once these issues are resolved.  LEFT CAROTID BRUIT: The patient had a left carotid bruit in the office. Duplex scan here shows a 40-59% right carotid stenosis and a less than 39% left carotid stenosis.  SUBJECTIVE:   No specific complaints.   PHYSICAL EXAM:   Vitals:   06/13/17 0400 06/13/17 0401 06/13/17 0822 06/13/17 1220  BP: (!) 152/43  (!) 143/60 (!) 141/58  Pulse: 74  75   Resp: 19   17  Temp: 98.7 F (37.1 C)  98.3 F (36.8 C) 98.3 F (36.8 C)  TempSrc: Oral  Oral Oral  SpO2: 99%  100% 100%  Weight:  162 lb 6.4 oz (73.7 kg)    Height:       Both feet are warm and well-perfused.  No hematoma right groin.  The wound on the lateral aspect of her left transmetatarsal amputation is stable without erythema or drainage.  LABS:   Lab Results  Component Value Date   WBC 11.0 (H) 06/12/2017   HGB 8.7 (L) 06/12/2017   HCT 26.6 (L) 06/12/2017   MCV 78.9 06/12/2017   PLT 311 06/12/2017   Lab Results  Component Value Date   CREATININE 1.54 (H) 06/13/2017   Lab Results  Component Value Date   INR 1.05 06/13/2017   CBG (last 3)   Recent Labs  06/12/17 2136  06/13/17 0620 06/13/17 1110  GLUCAP 161* 101* 279*    PROBLEM LIST:    Principal Problem:   Dyspnea Active Problems:   Atherosclerosis of native arteries of the extremities with ulceration ([place])   Diabetes mellitus with complication (HCC)   Tobacco use disorder   Anemia of unknown etiology   Acute kidney injury ([place])   Essential hypertension   PVD (peripheral vascular disease) ([place])   CURRENT MEDS:   . [START ON 06/16/2017] alendronate  70 mg Oral Q7 days  . [START ON 06/14/2017] aspirin  81 mg Oral Pre-Cath  . clopidogrel  75 mg Oral Daily  . heparin subcutaneous  5,000 Units Subcutaneous Q8H  . insulin aspart  0-5 Units Subcutaneous QHS  . insulin aspart  0-9 Units Subcutaneous TID WC  . insulin glargine  20 Units Subcutaneous QHS  . labetalol  300 mg Oral BID  . LORazepam  0.5 mg Oral QHS  . mouth rinse  15 mL Mouth Rinse BID  . simvastatin  40 mg Oral QHS  . sodium chloride flush  3 mL Intravenous Q12H  . sodium chloride flush  3 mL Intravenous Q12H    Gae Gallop Beeper: [phone] Office: [phone] 06/13/2017

## 2017-06-13 NOTE — Progress Notes (Signed)
PROGRESS NOTE    Sherri Brooks  OZH:[id number] DOB: 1945/12/21 DOA: 06/10/2017 PCP: Jake Samples, PA-C    Brief Narrative:  71 y.o. female with medical history significant hypertension, diabetes, peripheral vascular disease status post left femoral to below knee popliteal artery bypass grafting 2011, hyperlipidemia, GERD, CAD underwent angiogram 8/27 and developed dyspnea.    We were subsequently consulted for further medical evaluation and recommendations. Pt breathing comfortably on room air currently.   Assessment & Plan:   Principal Problem:   Dyspnea Active Problems:   Atherosclerosis of native arteries of the extremities with ulceration (HCC)   Diabetes mellitus with complication (HCC)   Tobacco use disorder   Anemia of unknown etiology   Acute kidney injury ([place])   Essential hypertension   PVD (peripheral vascular disease) ([place])   #1. Dyspnea, thought 2/2 Possible COPD and Volume verload:  Likely multifactorial specifically mild exacerbation of undiagnosed COPD in setting of possible volume overload during angiogram and anemia. May have had "flash" pulmonary edema with elevated BP's requiring labetalol.  EKG without acute changes and her O2 sats were normal on 3 L.   - Echocardiogram per cards - EF 62-95%, grade 2 diastolic dysfunction (see report) - Defer diuresis to cardiology - Recommend outpatient pulmonary function tests - Patient is currently breathing comfortably on room air  #2. Troponemia:  Likely demand ischemia in setting of volume overload, elevated pro BNP. - echo with grade 2 diastolic dysfunction, EF 28-41% - cardiology planning for cath    #3 Peripheral vascular disease: - Vascular on board who does not recommend a third attempt of femoral popliteal bypass grafting. They're more concerned about her cardiac issues and will arrange operation in the future once cardiac issues addressed.  # 4. Acute kidney injury. Baseline recently maybe 1.2?  Up to  1.8, with contrast and possible volume overload as noted above (vs also received lasix).  Appears euvolemic. - Holding lasix per cards and d/c HCTZ  #5 Hypokalemia: WNL on last check.  #6. Hypertension. Poor control, meds include hydrochlorothiazide and labetalol. -hold HCTZ as above -continue labetalol -Monitor -hydralazine PRN on board.  #7. Anemia. Hemoglobin 7.5, H/H dropped after transfusion, inappropriate.  Possibly some blood loss from procedure, but impressive iron deficiency anemia as well? (normal about 1 year ago.  04/2016 H/H 12.9/38). - with PM H/H downtrending, will transfuse additional unit of pRBC (2nd unit over hospital course).  Risk/benefits discussed. - transfuse for < 7   - FOBT pending - Anemia panel suggestive of iron def anemia (normal B12/folate), could consider iron transfusion after H/H stabilizes.   - reassess cbc next am.  5. Diabetes. Serum glucose 144 on admission. Home medications include Lantus 30 units at bedtime -lantus 20 units nightly, increase prn -Carb modified diet -Obtain a hemoglobin A1c 6.2 -Sliding scale insulin for optimal control  #6. Tobacco use. -Cessation counseling  DVT prophylaxis: SCD's, consider resuming pharm prophylaxis if H/H stabilizes Code Status: full  Family Communication: none Disposition Plan: pending improvement in kidney function, planned cath by cards   Consultants:   Cardiology  vascular  Procedures:   Echo, EF 32-44%, diastolic dysfunction (grade 2) (see report)  8/27 with vascular - US guided access to R common femoral artery, Aortogram with bilateral iliac arteriogram, selective catherization of L exteral iliac aftery with LL extremity runoff; angioplasty of L external iliac artery, retrograde R femoral arteriogram (see report)  Antimicrobials:   none   Subjective: Breathing comfortably on room air  Objective:  Vitals:   06/13/17 0400 06/13/17 0401 06/13/17 0822 06/13/17 1220  BP: (!)  152/43  (!) 143/60 (!) 141/58  Pulse: 74  75   Resp: 19   17  Temp: 98.7 F (37.1 C)  98.3 F (36.8 C) 98.3 F (36.8 C)  TempSrc: Oral  Oral Oral  SpO2: 99%  100% 100%  Weight:  73.7 kg (162 lb 6.4 oz)    Height:        Intake/Output Summary (Last 24 hours) at 06/13/17 1522 Last data filed at 06/13/17 1230  Gross per 24 hour  Intake             1200 ml  Output             1450 ml  Net             -250 ml   Filed Weights   06/11/17 0401 06/12/17 0400 06/13/17 0401  Weight: 76 kg (167 lb 8.8 oz) 75.8 kg (167 lb 1.6 oz) 73.7 kg (162 lb 6.4 oz)    Examination:Exam unchanged when compared to 06/12/2017  General exam: Appears calm and comfortable, in nad. Respiratory system: Clear to auscultation. Respiratory effort normal. Equal chest rise. Cardiovascular system: S1 & S2 heard, RRR. No JVD, murmurs, rubs, gallops or clicks. No pedal edema. Gastrointestinal system: Abdomen is nondistended, soft and nontender. No organomegaly or masses felt. Normal bowel sounds heard. Central nervous system: Alert and oriented. No focal neurological deficits. Extremities: L partial foot amputation.  Faintly palpable distal pulses.  No LEE. Skin: warm and dry Psychiatry: Judgement and insight appear normal. Mood & affect appropriate.     Data Reviewed:   CBC:  Recent Labs Lab 06/10/17 0710 06/10/17 1936 06/11/17 0447 06/11/17 1619 06/12/17 0416  WBC  --  8.3 8.4  --  11.0*  HGB 7.5* 7.8* 7.3* 7.1* 8.7*  HCT 22.0* 25.1* 22.7* 22.3* 26.6*  MCV  --  77.0* 75.7*  --  78.9  PLT  --  376 339  --  517   Basic Metabolic Panel:  Recent Labs Lab 06/10/17 0710 06/10/17 1936 06/11/17 0447 06/12/17 0416 06/13/17 1052  NA 142 137 137 140 137  K 3.9 3.4* 3.4* 3.8 4.0  CL 109 107 106 110 107  CO2  --  19* 21* 20* 21*  GLUCOSE 147* 188* 236* 112* 269*  BUN 20 24* 34* 40* 30*  CREATININE 1.40* 1.56* 1.80* 1.65* 1.54*  CALCIUM  --  8.9 8.8* 8.8* 8.5*   GFR: Estimated Creatinine  Clearance: 31.7 mL/min (A) (by C-G formula based on SCr of 1.54 mg/dL (H)). Liver Function Tests:  Recent Labs Lab 06/10/17 1936  AST 32  ALT 18  ALKPHOS 67  BILITOT 0.9  PROT 7.7  ALBUMIN 3.3*   No results for input(s): LIPASE, AMYLASE in the last 168 hours. No results for input(s): AMMONIA in the last 168 hours. Coagulation Profile:  Recent Labs Lab 06/13/17 1417  INR 1.05   Cardiac Enzymes:  Recent Labs Lab 06/10/17 1936 06/10/17 2304 06/11/17 0447 06/11/17 1152 06/11/17 1619  TROPONINI 0.13* 0.20* 0.26* 0.27* 0.25*   BNP (last 3 results) No results for input(s): PROBNP in the last 8760 hours. HbA1C:  Recent Labs  06/10/17 1936  HGBA1C 6.2*   CBG:  Recent Labs Lab 06/12/17 1119 06/12/17 1619 06/12/17 2136 06/13/17 0620 06/13/17 1110  GLUCAP 229* 221* 161* 101* 279*   Lipid Profile: No results for input(s): CHOL, HDL, LDLCALC, TRIG, CHOLHDL, LDLDIRECT in the  last 72 hours. Thyroid Function Tests: No results for input(s): TSH, T4TOTAL, FREET4, T3FREE, THYROIDAB in the last 72 hours. Anemia Panel:  Recent Labs  06/10/17 1936  VITAMINB12 757  FOLATE 28.0  FERRITIN 12  TIBC 550*  IRON 18*  RETICCTPCT 2.5   Sepsis Labs: No results for input(s): PROCALCITON, LATICACIDVEN in the last 168 hours.  No results found for this or any previous visit (from the past 240 hour(s)).       Radiology Studies: No results found.      Scheduled Meds: . [START ON 06/16/2017] alendronate  70 mg Oral Q7 days  . [START ON 06/14/2017] aspirin  81 mg Oral Pre-Cath  . clopidogrel  75 mg Oral Daily  . heparin subcutaneous  5,000 Units Subcutaneous Q8H  . insulin aspart  0-5 Units Subcutaneous QHS  . insulin aspart  0-9 Units Subcutaneous TID WC  . insulin glargine  20 Units Subcutaneous QHS  . labetalol  300 mg Oral BID  . LORazepam  0.5 mg Oral QHS  . mouth rinse  15 mL Mouth Rinse BID  . simvastatin  40 mg Oral QHS  . sodium chloride flush  3 mL  Intravenous Q12H  . sodium chloride flush  3 mL Intravenous Q12H   Continuous Infusions: . sodium chloride    . sodium chloride    . sodium chloride    . [START ON 06/14/2017] sodium chloride       LOS: 3 days    Time spent: 35 min  Velvet Bathe, MD [facility] [phone]   If 7PM-7AM, please contact night-coverage [email or web address] Password [email or web address] 06/13/2017, 3:22 PM

## 2017-06-13 NOTE — Progress Notes (Signed)
Progress Note  Patient Name: Sherri Brooks Date of Encounter: 06/13/2017  Primary Cardiologist: Dr. Stanford Breed  Subjective   Denies CP or dyspnea  Inpatient Medications    Scheduled Meds: . [START ON 06/16/2017] alendronate  70 mg Oral Q7 days  . clopidogrel  75 mg Oral Daily  . heparin subcutaneous  5,000 Units Subcutaneous Q8H  . insulin aspart  0-5 Units Subcutaneous QHS  . insulin aspart  0-9 Units Subcutaneous TID WC  . insulin glargine  20 Units Subcutaneous QHS  . labetalol  300 mg Oral BID  . LORazepam  0.5 mg Oral QHS  . mouth rinse  15 mL Mouth Rinse BID  . simvastatin  40 mg Oral QHS  . sodium chloride flush  3 mL Intravenous Q12H   Continuous Infusions: . sodium chloride    . sodium chloride     PRN Meds: sodium chloride, acetaminophen **OR** acetaminophen, albuterol, bisacodyl, hydrALAZINE, HYDROcodone-acetaminophen, labetalol, ondansetron **OR** ondansetron (ZOFRAN) IV, sodium chloride flush, traZODone   Vital Signs    Vitals:   06/13/17 0053 06/13/17 0400 06/13/17 0401 06/13/17 0822  BP: (!) 136/51 (!) 152/43  (!) 143/60  Pulse: 77 74  75  Resp: 17 19    Temp: 98.7 F (37.1 C) 98.7 F (37.1 C)  98.3 F (36.8 C)  TempSrc: Oral Oral  Oral  SpO2: 99% 99%  100%  Weight:   73.7 kg (162 lb 6.4 oz)   Height:        Intake/Output Summary (Last 24 hours) at 06/13/17 1000 Last data filed at 06/13/17 0730  Gross per 24 hour  Intake             1080 ml  Output              200 ml  Net              880 ml   Filed Weights   06/11/17 0401 06/12/17 0400 06/13/17 0401  Weight: 76 kg (167 lb 8.8 oz) 75.8 kg (167 lb 1.6 oz) 73.7 kg (162 lb 6.4 oz)    Telemetry    NSR, NSVT.  - Personally Reviewed   Physical Exam   General: Well developed, well nourished NAD Head: Normal  Neck: Supple Lungs:  CTA Heart: RRR Abdomen: Soft, non-tender, non-distended, no masses Extremities: No edema.  Neuro: Grossly intact   Labs    Chemistry  Recent  Labs Lab 06/10/17 1936 06/11/17 0447 06/12/17 0416  NA 137 137 140  K 3.4* 3.4* 3.8  CL 107 106 110  CO2 19* 21* 20*  GLUCOSE 188* 236* 112*  BUN 24* 34* 40*  CREATININE 1.56* 1.80* 1.65*  CALCIUM 8.9 8.8* 8.8*  PROT 7.7  --   --   ALBUMIN 3.3*  --   --   AST 32  --   --   ALT 18  --   --   ALKPHOS 67  --   --   BILITOT 0.9  --   --   GFRNONAA 32* 27* 30*  GFRAA 37* 31* 35*  ANIONGAP 11 10 10      Hematology  Recent Labs Lab 06/10/17 1936 06/11/17 0447 06/11/17 1619 06/12/17 0416  WBC 8.3 8.4  --  11.0*  RBC 3.26*  3.29* 3.00*  --  3.37*  HGB 7.8* 7.3* 7.1* 8.7*  HCT 25.1* 22.7* 22.3* 26.6*  MCV 77.0* 75.7*  --  78.9  MCH 23.9* 24.3*  --  25.8*  MCHC 31.1  32.2  --  32.7  RDW 15.6* 16.2*  --  17.1*  PLT 376 339  --  311    Cardiac Enzymes  Recent Labs Lab 06/10/17 2304 06/11/17 0447 06/11/17 1152 06/11/17 1619  TROPONINI 0.20* 0.26* 0.27* 0.25*   No results for input(s): TROPIPOC in the last 168 hours.   BNP  Recent Labs Lab 06/10/17 1936  BNP 462.3*      Radiology    Dg Chest Port 1 View  Result Date: 06/10/2017 CLINICAL DATA:  71 year old female with shortness of breath. EXAM: PORTABLE CHEST 1 VIEW COMPARISON:  11/03/2012 FINDINGS: The heart size and mediastinal contours are within normal limits. Both lungs are clear. The visualized skeletal structures are unremarkable. IMPRESSION: No active disease. Electronically Signed   By: Kristopher Oppenheim M.D.   On: 06/10/2017 13:44    Cardiac Studies   Echocardiogram: 06/11/2017 Study Conclusions  - Left ventricle: The cavity size was normal. Wall thickness was   increased in a pattern of moderate LVH. Systolic function was   vigorous. The estimated ejection fraction was in the range of 65%   to 70%. Wall motion was normal; there were no regional wall   motion abnormalities. Features are consistent with a pseudonormal   left ventricular filling pattern, with concomitant abnormal   relaxation and  increased filling pressure (grade 2 diastolic   dysfunction). - Mitral valve: Moderately calcified annulus. There was systolic   anterior motion. There was moderate regurgitation directed   posteriorly. - Pulmonary arteries: Systolic pressure was mildly increased.  Impressions:  - Compared to the prior study, there has been no significant   interval change.  Patient Profile     71 y.o. female w/ PMH of CAD, PVD, HTN, HLD, and Type 2 DM who presented to [facility] on 06/10/2017 for planned abdominal aortogram. Developed acute respiratory distress during the procedure with Cardiology being consulted for further evaluation.   Assessment & Plan    1. Acute Dyspnea/ Elevated Troponin - Etiology of previous dyspnea unclear but includes ischemia mediated pulmonary edema, volume excess, elevated blood pressure and COPD. She is much improved. However her troponin is minimally elevated and she has multiple risk factors. She also has significant vascular disease and will require surgery. I feel definitive evaluation is warranted. Plan cardiac catheterization tomorrow if renal function stable. The risks and benefits including myocardial infarction, CVA and death and worsening renal function discussed and patient agrees to proceed.  2. HTN - Blood pressure mildly elevated. Labetalol increased yesterday. Will follow and adjust regimen as needed.  3. Anemia - Further evaluation per primary care.  4. PVD - continue plavix and statin. - Vascular Surgery following. May need surgical intervention.  5. Acute on chronic Stage 3 CKD - Will recheck renal function today and tomorrow morning. She understands the risk of contrast nephropathy. I will not hydrate prior to procedure and she developed pulmonary edema following hydration at time of arteriogram for peripheral vascular disease. Hydrochlorothiazide on hold.  6. Mitral Regurgitation - moderate by echo this admission. Continue to follow in the  outpatient setting.   Signed, Kirk Ruths , MD 10:00 AM 06/13/2017

## 2017-06-14 ENCOUNTER — Encounter (HOSPITAL_COMMUNITY)
Admission: AD | Disposition: A | Discharge status: Home / Self Care | Payer: Self-pay | Source: Ambulatory Visit | Attending: Family Medicine

## 2017-06-14 DIAGNOSIS — I2510 Atherosclerotic heart disease of native coronary artery without angina pectoris: Secondary | ICD-10-CM

## 2017-06-14 DIAGNOSIS — R0602 Shortness of breath: Secondary | ICD-10-CM

## 2017-06-14 DIAGNOSIS — I10 Essential (primary) hypertension: Secondary | ICD-10-CM

## 2017-06-14 HISTORY — PX: CORONARY STENT INTERVENTION: CATH118234

## 2017-06-14 HISTORY — PX: LEFT HEART CATH AND CORONARY ANGIOGRAPHY: CATH118249

## 2017-06-14 LAB — TYPE AND SCREEN
ABO/RH(D): O POS
Antibody Screen: NEGATIVE
UNIT DIVISION: 0
Unit division: 0
Unit division: 0

## 2017-06-14 LAB — GLUCOSE, CAPILLARY
GLUCOSE-CAPILLARY: 120 mg/dL — AB (ref 65–99)
GLUCOSE-CAPILLARY: 139 mg/dL — AB (ref 65–99)
GLUCOSE-CAPILLARY: 172 mg/dL — AB (ref 65–99)
Glucose-Capillary: 216 mg/dL — ABNORMAL HIGH (ref 65–99)

## 2017-06-14 LAB — BASIC METABOLIC PANEL
Anion gap: 6 (ref 5–15)
BUN: 26 mg/dL — AB (ref 6–20)
CO2: 20 mmol/L — AB (ref 22–32)
Calcium: 8.7 mg/dL — ABNORMAL LOW (ref 8.9–10.3)
Chloride: 113 mmol/L — ABNORMAL HIGH (ref 101–111)
Creatinine, Ser: 1.47 mg/dL — ABNORMAL HIGH (ref 0.44–1.00)
GFR calc Af Amer: 40 mL/min — ABNORMAL LOW (ref >60)
GFR, EST NON AFRICAN AMERICAN: 34 mL/min — AB (ref >60)
GLUCOSE: 139 mg/dL — AB (ref 65–99)
POTASSIUM: 3.7 mmol/L (ref 3.5–5.1)
Sodium: 139 mmol/L (ref 135–145)

## 2017-06-14 LAB — POCT ACTIVATED CLOTTING TIME
ACTIVATED CLOTTING TIME: 268 s
ACTIVATED CLOTTING TIME: 318 s

## 2017-06-14 LAB — CBC
HCT: 28.3 % — ABNORMAL LOW (ref 36.0–46.0)
Hemoglobin: 9 g/dL — ABNORMAL LOW (ref 12.0–15.0)
MCH: 25.3 pg — ABNORMAL LOW (ref 26.0–34.0)
MCHC: 31.8 g/dL (ref 30.0–36.0)
MCV: 79.5 fL (ref 78.0–100.0)
PLATELETS: 308 10*3/uL (ref 150–400)
RBC: 3.56 MIL/uL — ABNORMAL LOW (ref 3.87–5.11)
RDW: 16.5 % — ABNORMAL HIGH (ref 11.5–15.5)
WBC: 8.2 10*3/uL (ref 4.0–10.5)

## 2017-06-14 LAB — BPAM RBC
BLOOD PRODUCT EXPIRATION DATE: 201809252359
BLOOD PRODUCT EXPIRATION DATE: 201809252359
BLOOD PRODUCT EXPIRATION DATE: 201809252359
ISSUE DATE / TIME: 201808271449
ISSUE DATE / TIME: 201808282019
UNIT TYPE AND RH: 5100
Unit Type and Rh: 5100
Unit Type and Rh: 5100

## 2017-06-14 SURGERY — LEFT HEART CATH AND CORONARY ANGIOGRAPHY
Anesthesia: LOCAL

## 2017-06-14 MED ORDER — ATORVASTATIN CALCIUM 20 MG PO TABS
20.0000 mg | ORAL_TABLET | Freq: Every day | ORAL | Status: DC
  Administered 2017-06-14: 20 mg via ORAL
  Filled 2017-06-14: qty 1

## 2017-06-14 MED ORDER — MIDAZOLAM HCL 2 MG/2ML IJ SOLN
INTRAMUSCULAR | Status: AC
  Filled 2017-06-14: qty 2

## 2017-06-14 MED ORDER — HEPARIN SODIUM (PORCINE) 1000 UNIT/ML IJ SOLN
INTRAMUSCULAR | Status: DC | PRN
  Administered 2017-06-14 (×2): 3500 [IU] via INTRAVENOUS
  Administered 2017-06-14: 2000 [IU] via INTRAVENOUS

## 2017-06-14 MED ORDER — LIDOCAINE HCL 2 % IJ SOLN
INTRAMUSCULAR | Status: AC
  Filled 2017-06-14: qty 10

## 2017-06-14 MED ORDER — DIPHENHYDRAMINE HCL 50 MG/ML IJ SOLN: 50.0000 mg | Freq: Once | INTRAMUSCULAR | Status: AC

## 2017-06-14 MED ORDER — SODIUM CHLORIDE 0.9 % IV SOLN: INTRAVENOUS | Status: DC

## 2017-06-14 MED ORDER — VERAPAMIL HCL 2.5 MG/ML IV SOLN
INTRAVENOUS | Status: AC
  Filled 2017-06-14: qty 2

## 2017-06-14 MED ORDER — AMLODIPINE BESYLATE 5 MG PO TABS
5.0000 mg | ORAL_TABLET | Freq: Every day | ORAL | Status: DC
  Administered 2017-06-14 – 2017-06-15 (×2): 5 mg via ORAL
  Filled 2017-06-14 (×2): qty 1

## 2017-06-14 MED ORDER — FENTANYL CITRATE (PF) 100 MCG/2ML IJ SOLN
INTRAMUSCULAR | Status: AC
  Filled 2017-06-14: qty 2

## 2017-06-14 MED ORDER — MIDAZOLAM HCL 2 MG/2ML IJ SOLN
INTRAMUSCULAR | Status: DC | PRN
  Administered 2017-06-14: 1 mg via INTRAVENOUS

## 2017-06-14 MED ORDER — IOPAMIDOL (ISOVUE-370) INJECTION 76%
INTRAVENOUS | Status: DC | PRN
Start: 2017-06-14 — End: 2017-06-14
  Administered 2017-06-14: 180 mL

## 2017-06-14 MED ORDER — IOPAMIDOL (ISOVUE-370) INJECTION 76%
INTRAVENOUS | Status: AC
  Filled 2017-06-14: qty 50

## 2017-06-14 MED ORDER — HEPARIN (PORCINE) IN NACL 2-0.9 UNIT/ML-% IJ SOLN
INTRAMUSCULAR | Status: DC | PRN
  Administered 2017-06-14: 10 mL via INTRA_ARTERIAL

## 2017-06-14 MED ORDER — NITROGLYCERIN 1 MG/10 ML FOR IR/CATH LAB
INTRA_ARTERIAL | Status: AC
  Filled 2017-06-14: qty 10

## 2017-06-14 MED ORDER — HYDRALAZINE HCL 20 MG/ML IJ SOLN
INTRAMUSCULAR | Status: AC
  Filled 2017-06-14: qty 1

## 2017-06-14 MED ORDER — IOPAMIDOL (ISOVUE-370) INJECTION 76%
INTRAVENOUS | Status: AC
  Filled 2017-06-14: qty 100

## 2017-06-14 MED ORDER — LIDOCAINE HCL (PF) 1 % IJ SOLN
INTRAMUSCULAR | Status: DC | PRN
  Administered 2017-06-14: 3 mL

## 2017-06-14 MED ORDER — LABETALOL HCL 5 MG/ML IV SOLN
INTRAVENOUS | Status: DC | PRN
  Administered 2017-06-14 (×2): 10 mg via INTRAVENOUS

## 2017-06-14 MED ORDER — DIPHENHYDRAMINE HCL 25 MG PO CAPS
50.0000 mg | ORAL_CAPSULE | Freq: Once | ORAL | Status: AC
  Administered 2017-06-14: 50 mg via ORAL
  Filled 2017-06-14: qty 2

## 2017-06-14 MED ORDER — HEPARIN (PORCINE) IN NACL 2-0.9 UNIT/ML-% IJ SOLN
INTRAMUSCULAR | Status: AC | PRN
  Administered 2017-06-14: 1000 mL

## 2017-06-14 MED ORDER — LABETALOL HCL 5 MG/ML IV SOLN
INTRAVENOUS | Status: AC
Start: 2017-06-14 — End: ?
  Filled 2017-06-14: qty 4

## 2017-06-14 MED ORDER — CLOPIDOGREL BISULFATE 300 MG PO TABS
ORAL_TABLET | ORAL | Status: DC | PRN
  Administered 2017-06-14: 300 mg via ORAL

## 2017-06-14 MED ORDER — HYDRALAZINE HCL 20 MG/ML IJ SOLN
INTRAMUSCULAR | Status: DC | PRN
  Administered 2017-06-14: 10 mg via INTRAVENOUS

## 2017-06-14 MED ORDER — NITROGLYCERIN 1 MG/10 ML FOR IR/CATH LAB
INTRA_ARTERIAL | Status: DC | PRN
  Administered 2017-06-14: 200 ug via INTRACORONARY

## 2017-06-14 MED ORDER — CLOPIDOGREL BISULFATE 300 MG PO TABS
ORAL_TABLET | ORAL | Status: AC
  Filled 2017-06-14: qty 1

## 2017-06-14 MED ORDER — HEPARIN (PORCINE) IN NACL 2-0.9 UNIT/ML-% IJ SOLN
INTRAMUSCULAR | Status: AC
  Filled 2017-06-14: qty 1000

## 2017-06-14 MED ORDER — ACTIVE PARTNERSHIP FOR HEALTH OF YOUR HEART BOOK
Freq: Once | Status: DC
  Filled 2017-06-14: qty 1

## 2017-06-14 MED ORDER — METHYLPREDNISOLONE SODIUM SUCC 40 MG IJ SOLR
40.0000 mg | INTRAMUSCULAR | Status: AC
  Administered 2017-06-14 (×3): 40 mg via INTRAVENOUS
  Filled 2017-06-14 (×3): qty 1

## 2017-06-14 MED ORDER — ANGIOPLASTY BOOK
Freq: Once | Status: AC
  Administered 2017-06-14: 19:00:00
  Filled 2017-06-14: qty 1

## 2017-06-14 MED ORDER — FENTANYL CITRATE (PF) 100 MCG/2ML IJ SOLN
INTRAMUSCULAR | Status: DC | PRN
  Administered 2017-06-14: 50 ug via INTRAVENOUS

## 2017-06-14 MED ORDER — HEPARIN SODIUM (PORCINE) 1000 UNIT/ML IJ SOLN
INTRAMUSCULAR | Status: AC
  Filled 2017-06-14: qty 1

## 2017-06-14 SURGICAL SUPPLY — 21 items
BALLN SAPPHIRE 2.5X12 (BALLOONS) ×2
BALLN SAPPHIRE ~~LOC~~ 3.5X8 (BALLOONS) ×2 IMPLANT
BALLN WOLVERINE 2.75X10 (BALLOONS) ×2
BALLOON SAPPHIRE 2.5X12 (BALLOONS) ×1 IMPLANT
BALLOON WOLVERINE 2.75X10 (BALLOONS) ×1 IMPLANT
CATH OPTITORQUE TIG 4.0 5F (CATHETERS) ×2 IMPLANT
CATH VISTA GUIDE 6FR JR4 SH (CATHETERS) ×2 IMPLANT
COVER PRB 48X5XTLSCP FOLD TPE (BAG) ×1 IMPLANT
COVER PROBE 5X48 (BAG) ×1
DEVICE RAD COMP TR BAND LRG (VASCULAR PRODUCTS) ×2 IMPLANT
GLIDESHEATH SLEND SS 6F .021 (SHEATH) ×2 IMPLANT
GUIDEWIRE INQWIRE 1.5J.035X260 (WIRE) ×1 IMPLANT
INQWIRE 1.5J .035X260CM (WIRE) ×2
KIT ENCORE 26 ADVANTAGE (KITS) ×2 IMPLANT
KIT HEART LEFT (KITS) ×2 IMPLANT
PACK CARDIAC CATHETERIZATION (CUSTOM PROCEDURE TRAY) ×2 IMPLANT
STENT SIERRA 3.00 X 12 MM (Permanent Stent) ×2 IMPLANT
SYR CONTROL 10ML ANGIOGRAPHIC (SYRINGE) ×2 IMPLANT
TRANSDUCER W/STOPCOCK (MISCELLANEOUS) ×2 IMPLANT
TUBING CIL FLEX 10 FLL-RA (TUBING) ×2 IMPLANT
WIRE MARVEL STR TIP 190CM (WIRE) ×2 IMPLANT

## 2017-06-14 NOTE — Interval H&P Note (Signed)
History and Physical Interval Note:  06/14/2017 4:55 PM  Sherri Brooks  has presented today for surgery, with the diagnosis of cp & + Tropoinin (? MI vs. Demand Infarct)  The various methods of treatment have been discussed with the patient and family. After consideration of risks, benefits and other options for treatment, the patient has consented to  Procedure(s): LEFT HEART CATH AND CORONARY ANGIOGRAPHY (N/A) with possible Percutaneous Coronary Intervention as a surgical intervention .  The patient's history has been reviewed, patient examined, no change in status, stable for surgery.  I have reviewed the patient's chart and labs.  Questions were answered to the patient's satisfaction.    Cath Lab Visit (complete for each Cath Lab visit)  Clinical Evaluation Leading to the Procedure:   ACS: Yes.    Non-ACS:    Anginal Classification: CCS III  Anti-ischemic medical therapy: No Therapy  Non-Invasive Test Results: No non-invasive testing performed  Prior CABG: No previous CABG    Sherri Brooks

## 2017-06-14 NOTE — Progress Notes (Signed)
   VASCULAR SURGERY ASSESSMENT & PLAN:   For cardiac catheterization today. From my standpoint, if no further workup or intervention is indicated the patient could be discharged and I can follow the left foot wound as an outpatient. If the foot wound does not improve, then the patient could be considered for endarterectomy and profundoplasty or possibly an endovascular approach on the left to her infrainguinal arterial occlusive disease. I will check back after the long weekend. If there is any problems over the weekend, Dr. Donzetta Matters is on call.   SUBJECTIVE:   No complaints.  PHYSICAL EXAM:   Vitals:   06/13/17 2204 06/13/17 2358 06/14/17 0423 06/14/17 1015  BP: (!) 127/56 (!) 172/64 (!) 154/70 (!) 163/60  Pulse:      Resp:  18  18  Temp: 98.6 F (37 C) 98.2 F (36.8 C) 98.1 F (36.7 C) 98.1 F (36.7 C)  TempSrc: Oral  Oral Oral  SpO2: 100% 96% 100% 100%  Weight:   159 lb 3.2 oz (72.2 kg)   Height:       Both feet are warm and well perfused. The wound on the lateral aspect of the transmetatarsal amputation site is stable without significant drainage or erythema.  LABS:   Lab Results  Component Value Date   WBC 8.2 06/14/2017   HGB 9.0 (L) 06/14/2017   HCT 28.3 (L) 06/14/2017   MCV 79.5 06/14/2017   PLT 308 06/14/2017   Lab Results  Component Value Date   CREATININE 1.47 (H) 06/14/2017   Lab Results  Component Value Date   INR 1.05 06/13/2017   CBG (last 3)   Recent Labs  06/14/17 0614 06/14/17 1104 06/14/17 1631  GLUCAP 120* 139* 172*    PROBLEM LIST:    Principal Problem:   Dyspnea Active Problems:   Atherosclerosis of native arteries of the extremities with ulceration ([place])   Diabetes mellitus with complication ([place])   Tobacco use disorder   Anemia of unknown etiology   Acute kidney injury ([place])   Essential hypertension   PVD (peripheral vascular disease) ([place])   CURRENT MEDS:   . [START ON 06/16/2017] alendronate  70 mg Oral Q7 days  .  amLODipine  5 mg Oral Daily  . atorvastatin  20 mg Oral QHS  . clopidogrel  75 mg Oral Daily  . heparin subcutaneous  5,000 Units Subcutaneous Q8H  . insulin aspart  0-5 Units Subcutaneous QHS  . insulin aspart  0-9 Units Subcutaneous TID WC  . insulin glargine  20 Units Subcutaneous QHS  . labetalol  300 mg Oral BID  . LORazepam  0.5 mg Oral QHS  . mouth rinse  15 mL Mouth Rinse BID  . sodium chloride flush  3 mL Intravenous Q12H  . sodium chloride flush  3 mL Intravenous Q12H    Gae Gallop Beeper: [phone] Office: [phone] 06/14/2017

## 2017-06-14 NOTE — Care Management Important Message (Signed)
Important Message  Patient Details  Name: Sherri Brooks MRN: [id number] Date of Birth: 04-Jul-1946   Medicare Important Message Given:  Yes    [name] May 06/14/2017, 9:29 AM

## 2017-06-14 NOTE — Care Management Note (Signed)
Case Management Note Marvetta Gibbons RN, BSN Unit 4E-Case Manager [phone]  Patient Details  Name: Sherri Brooks MRN: [id number] Date of Birth: October 01, 1946  Subjective/Objective:  Pt admitted s/p angiogram on 8/27 with dyspnea  ?COPD and vol overload               Action/Plan: PTA pt lived at home- plan to return home- CM will follow for d/c needs  Expected Discharge Date:                  Expected Discharge Plan:  Home/Self Care  In-House Referral:     Discharge planning Services  CM Consult  Post Acute Care Choice:    Choice offered to:     DME Arranged:    DME Agency:     HH Arranged:    HH Agency:     Status of Service:  In process, will continue to follow  If discussed at Long Length of Stay Meetings, dates discussed:    Discharge Disposition:   Additional Comments:  Dawayne Patricia, RN 06/14/2017, 2:58 PM

## 2017-06-14 NOTE — Progress Notes (Signed)
PROGRESS NOTE    Sherri Brooks  QPY:[id number] DOB: 07/12/1946 DOA: 06/10/2017 PCP: Jake Samples, PA-C    Brief Narrative:  71 y.o. female with medical history significant hypertension, diabetes, peripheral vascular disease status post left femoral to below knee popliteal artery bypass grafting 2011, hyperlipidemia, GERD, CAD underwent angiogram 8/27 and developed dyspnea.    We were subsequently consulted for further medical evaluation and recommendations. Pt breathing comfortably on room air currently.  Assessment & Plan:   Principal Problem:   Dyspnea Active Problems:   Atherosclerosis of native arteries of the extremities with ulceration (HCC)   Diabetes mellitus with complication (HCC)   Tobacco use disorder   Anemia of unknown etiology   Acute kidney injury ([place])   Essential hypertension   PVD (peripheral vascular disease) ([place])   #1. Dyspnea, thought 2/2 Possible COPD and Volume verload:  Likely multifactorial specifically mild exacerbation of undiagnosed COPD in setting of possible volume overload during angiogram and anemia. May have had "flash" pulmonary edema with elevated BP's requiring labetalol.  EKG without acute changes and her O2 sats were normal on 3 L.   - Echocardiogram per cards - EF 12-45%, grade 2 diastolic dysfunction (see report) - Defer diuresis to cardiology. Lasix on hold. - Recommend outpatient pulmonary function tests - Patient is currently breathing comfortably on room air  #2. Troponemia:  Likely demand ischemia in setting of volume overload, elevated pro BNP. - echo with grade 2 diastolic dysfunction, EF 80-99% - cardiology planning for cath today  #3 Peripheral vascular disease: - Vascular on board who does not recommend a third attempt of femoral popliteal bypass grafting. They're more concerned about her cardiac issues and will arrange operation in the future once cardiac issues addressed.  # 4. Acute kidney injury. Baseline recently  maybe 1.2?  Up to 1.8, with contrast and possible volume overload as noted above (vs also received lasix).  Appears euvolemic. - Holding lasix per cards and d/c HCTZ  #5 Hypokalemia: WNL on last check.  #6. Hypertension. Poor control, meds include hydrochlorothiazide and labetalol. -hold HCTZ as above -continue labetalol -Monitor -hydralazine PRN on board.  #7. Anemia. Hemoglobin 7.5, H/H dropped after transfusion, inappropriate.  Possibly some blood loss from procedure, but impressive iron deficiency anemia as well? (normal about 1 year ago.  04/2016 H/H 12.9/38). - with PM H/H downtrending, will transfuse additional unit of pRBC (2nd unit over hospital course).  Risk/benefits discussed. - transfuse for < 7   - FOBT pending - Anemia panel suggestive of iron def anemia (normal B12/folate), could consider iron transfusion after H/H stabilizes.   - reassess cbc next am.  5. Diabetes. Serum glucose 144 on admission. Home medications include Lantus 30 units at bedtime -lantus 20 units nightly, increase prn -Carb modified diet -Obtain a hemoglobin A1c 6.2 -Sliding scale insulin for optimal control  #6. Tobacco use. -Cessation counseling  DVT prophylaxis: SCD's, consider resuming pharm prophylaxis if H/H stabilizes Code Status: full  Family Communication: none Disposition Plan: pending improvement in kidney function, planned cath by cards   Consultants:   Cardiology  vascular  Procedures:   Echo, EF 83-38%, diastolic dysfunction (grade 2) (see report)  8/27 with vascular - US guided access to R common femoral artery, Aortogram with bilateral iliac arteriogram, selective catherization of L exteral iliac aftery with LL extremity runoff; angioplasty of L external iliac artery, retrograde R femoral arteriogram (see report)  Antimicrobials:   none   Subjective: She reports no new breathing problems.  Objective: Vitals:   06/13/17 2204 06/13/17 2358 06/14/17 0423  06/14/17 1015  BP: (!) 127/56 (!) 172/64 (!) 154/70 (!) 163/60  Pulse:      Resp:  18  18  Temp: 98.6 F (37 C) 98.2 F (36.8 C) 98.1 F (36.7 C) 98.1 F (36.7 C)  TempSrc: Oral  Oral Oral  SpO2: 100% 96% 100% 100%  Weight:   72.2 kg (159 lb 3.2 oz)   Height:        Intake/Output Summary (Last 24 hours) at 06/14/17 1330 Last data filed at 06/14/17 0730  Gross per 24 hour  Intake                0 ml  Output                0 ml  Net                0 ml   Filed Weights   06/12/17 0400 06/13/17 0401 06/14/17 0423  Weight: 75.8 kg (167 lb 1.6 oz) 73.7 kg (162 lb 6.4 oz) 72.2 kg (159 lb 3.2 oz)    Examination:Exam unchanged when compared to 06/13/2017  General exam: Appears calm and comfortable, in nad. Respiratory system: Clear to auscultation. Respiratory effort normal. Equal chest rise. Cardiovascular system: S1 & S2 heard, RRR. No JVD, murmurs, rubs, gallops or clicks. No pedal edema. Gastrointestinal system: Abdomen is nondistended, soft and nontender. No organomegaly or masses felt. Normal bowel sounds heard. Central nervous system: Alert and oriented. No focal neurological deficits. Extremities: L partial foot amputation.  Faintly palpable distal pulses.  No LEE. Skin: warm and dry Psychiatry: Judgement and insight appear normal. Mood & affect appropriate.     Data Reviewed:   CBC:  Recent Labs Lab 06/10/17 1936 06/11/17 0447 06/11/17 1619 06/12/17 0416 06/14/17 0505  WBC 8.3 8.4  --  11.0* 8.2  HGB 7.8* 7.3* 7.1* 8.7* 9.0*  HCT 25.1* 22.7* 22.3* 26.6* 28.3*  MCV 77.0* 75.7*  --  78.9 79.5  PLT 376 339  --  311 267   Basic Metabolic Panel:  Recent Labs Lab 06/10/17 1936 06/11/17 0447 06/12/17 0416 06/13/17 1052 06/14/17 0235  NA 137 137 140 137 139  K 3.4* 3.4* 3.8 4.0 3.7  CL 107 106 110 107 113*  CO2 19* 21* 20* 21* 20*  GLUCOSE 188* 236* 112* 269* 139*  BUN 24* 34* 40* 30* 26*  CREATININE 1.56* 1.80* 1.65* 1.54* 1.47*  CALCIUM 8.9 8.8* 8.8*  8.5* 8.7*   GFR: Estimated Creatinine Clearance: 32.9 mL/min (A) (by C-G formula based on SCr of 1.47 mg/dL (H)). Liver Function Tests:  Recent Labs Lab 06/10/17 1936  AST 32  ALT 18  ALKPHOS 67  BILITOT 0.9  PROT 7.7  ALBUMIN 3.3*   No results for input(s): LIPASE, AMYLASE in the last 168 hours. No results for input(s): AMMONIA in the last 168 hours. Coagulation Profile:  Recent Labs Lab 06/13/17 1417  INR 1.05   Cardiac Enzymes:  Recent Labs Lab 06/10/17 1936 06/10/17 2304 06/11/17 0447 06/11/17 1152 06/11/17 1619  TROPONINI 0.13* 0.20* 0.26* 0.27* 0.25*   BNP (last 3 results) No results for input(s): PROBNP in the last 8760 hours. HbA1C: No results for input(s): HGBA1C in the last 72 hours. CBG:  Recent Labs Lab 06/13/17 1110 06/13/17 1617 06/13/17 2202 06/14/17 0614 06/14/17 1104  GLUCAP 279* 174* 170* 120* 139*   Lipid Profile: No results for input(s): CHOL, HDL, LDLCALC, TRIG, CHOLHDL,  LDLDIRECT in the last 72 hours. Thyroid Function Tests: No results for input(s): TSH, T4TOTAL, FREET4, T3FREE, THYROIDAB in the last 72 hours. Anemia Panel: No results for input(s): VITAMINB12, FOLATE, FERRITIN, TIBC, IRON, RETICCTPCT in the last 72 hours. Sepsis Labs: No results for input(s): PROCALCITON, LATICACIDVEN in the last 168 hours.  No results found for this or any previous visit (from the past 240 hour(s)).       Radiology Studies: No results found.      Scheduled Meds: . [START ON 06/16/2017] alendronate  70 mg Oral Q7 days  . amLODipine  5 mg Oral Daily  . clopidogrel  75 mg Oral Daily  . diphenhydrAMINE  50 mg Oral Once   Or  . diphenhydrAMINE  50 mg Intravenous Once  . heparin subcutaneous  5,000 Units Subcutaneous Q8H  . insulin aspart  0-5 Units Subcutaneous QHS  . insulin aspart  0-9 Units Subcutaneous TID WC  . insulin glargine  20 Units Subcutaneous QHS  . labetalol  300 mg Oral BID  . LORazepam  0.5 mg Oral QHS  . mouth rinse   15 mL Mouth Rinse BID  . methylPREDNISolone (SOLU-MEDROL) injection  40 mg Intravenous Q4H  . simvastatin  40 mg Oral QHS  . sodium chloride flush  3 mL Intravenous Q12H  . sodium chloride flush  3 mL Intravenous Q12H   Continuous Infusions: . sodium chloride    . sodium chloride    . sodium chloride    . sodium chloride 10 mL/hr (06/14/17 0609)     LOS: 4 days    Time spent: 52 min  Velvet Bathe, MD [facility] [phone]   If 7PM-7AM, please contact night-coverage [email or web address] Password [email or web address] 06/14/2017, 1:30 PM

## 2017-06-14 NOTE — Progress Notes (Signed)
Cath lab called to notify of dye allergy, accelerated regimen per radiology protocol started since cath is today. Dayna Dunn PA-C

## 2017-06-14 NOTE — Progress Notes (Signed)
Progress Note  Patient Name: Sherri Brooks Date of Encounter: 06/14/2017  Primary Cardiologist: Dr. Stanford Breed  Subjective   Pt without CP or dyspnea  Inpatient Medications    Scheduled Meds: . [START ON 06/16/2017] alendronate  70 mg Oral Q7 days  . clopidogrel  75 mg Oral Daily  . diphenhydrAMINE  50 mg Oral Once   Or  . diphenhydrAMINE  50 mg Intravenous Once  . heparin subcutaneous  5,000 Units Subcutaneous Q8H  . insulin aspart  0-5 Units Subcutaneous QHS  . insulin aspart  0-9 Units Subcutaneous TID WC  . insulin glargine  20 Units Subcutaneous QHS  . labetalol  300 mg Oral BID  . LORazepam  0.5 mg Oral QHS  . mouth rinse  15 mL Mouth Rinse BID  . methylPREDNISolone (SOLU-MEDROL) injection  40 mg Intravenous Q4H  . simvastatin  40 mg Oral QHS  . sodium chloride flush  3 mL Intravenous Q12H  . sodium chloride flush  3 mL Intravenous Q12H   Continuous Infusions: . sodium chloride    . sodium chloride    . sodium chloride    . sodium chloride 10 mL/hr (06/14/17 0609)   PRN Meds: sodium chloride, sodium chloride, acetaminophen **OR** acetaminophen, albuterol, bisacodyl, hydrALAZINE, HYDROcodone-acetaminophen, labetalol, ondansetron **OR** ondansetron (ZOFRAN) IV, sodium chloride flush, sodium chloride flush, traZODone   Vital Signs    Vitals:   06/13/17 1220 06/13/17 2204 06/13/17 2358 06/14/17 0423  BP: (!) 141/58 (!) 127/56 (!) 172/64 (!) 154/70  Pulse:      Resp: 17  18   Temp: 98.3 F (36.8 C) 98.6 F (37 C) 98.2 F (36.8 C) 98.1 F (36.7 C)  TempSrc: Oral Oral  Oral  SpO2: 100% 100% 96% 100%  Weight:    72.2 kg (159 lb 3.2 oz)  Height:        Intake/Output Summary (Last 24 hours) at 06/14/17 0908 Last data filed at 06/13/17 1230  Gross per 24 hour  Intake              480 ml  Output             1250 ml  Net             -770 ml   Filed Weights   06/12/17 0400 06/13/17 0401 06/14/17 0423  Weight: 75.8 kg (167 lb 1.6 oz) 73.7 kg (162 lb 6.4 oz)  72.2 kg (159 lb 3.2 oz)    Telemetry    NSR  - Personally Reviewed   Physical Exam   General: WD/WN NAD Head: Normal with normal eyelids Neck: Supple, no JVD Lungs:  CTA, no wheeze Heart: RRR, no murmur Abdomen: Soft, NT/ND Extremities: No edema. No defomity Neuro: no focal findings   Labs    Chemistry  Recent Labs Lab 06/10/17 1936  06/12/17 0416 06/13/17 1052 06/14/17 0235  NA 137  < > 140 137 139  K 3.4*  < > 3.8 4.0 3.7  CL 107  < > 110 107 113*  CO2 19*  < > 20* 21* 20*  GLUCOSE 188*  < > 112* 269* 139*  BUN 24*  < > 40* 30* 26*  CREATININE 1.56*  < > 1.65* 1.54* 1.47*  CALCIUM 8.9  < > 8.8* 8.5* 8.7*  PROT 7.7  --   --   --   --   ALBUMIN 3.3*  --   --   --   --   AST 32  --   --   --   --  ALT 18  --   --   --   --   ALKPHOS 67  --   --   --   --   BILITOT 0.9  --   --   --   --   GFRNONAA 32*  < > 30* 33* 34*  GFRAA 37*  < > 35* 38* 40*  ANIONGAP 11  < > 10 9 6   < > = values in this interval not displayed.   Hematology  Recent Labs Lab 06/11/17 0447 06/11/17 1619 06/12/17 0416 06/14/17 0505  WBC 8.4  --  11.0* 8.2  RBC 3.00*  --  3.37* 3.56*  HGB 7.3* 7.1* 8.7* 9.0*  HCT 22.7* 22.3* 26.6* 28.3*  MCV 75.7*  --  78.9 79.5  MCH 24.3*  --  25.8* 25.3*  MCHC 32.2  --  32.7 31.8  RDW 16.2*  --  17.1* 16.5*  PLT 339  --  311 308    Cardiac Enzymes  Recent Labs Lab 06/10/17 2304 06/11/17 0447 06/11/17 1152 06/11/17 1619  TROPONINI 0.20* 0.26* 0.27* 0.25*    BNP  Recent Labs Lab 06/10/17 1936  BNP 462.3*      Radiology    Dg Chest Port 1 View  Result Date: 06/10/2017 CLINICAL DATA:  71 year old female with shortness of breath. EXAM: PORTABLE CHEST 1 VIEW COMPARISON:  11/03/2012 FINDINGS: The heart size and mediastinal contours are within normal limits. Both lungs are clear. The visualized skeletal structures are unremarkable. IMPRESSION: No active disease. Electronically Signed   By: Kristopher Oppenheim M.D.   On: 06/10/2017 13:44     Cardiac Studies   Echocardiogram: 06/11/2017 Study Conclusions  - Left ventricle: The cavity size was normal. Wall thickness was   increased in a pattern of moderate LVH. Systolic function was   vigorous. The estimated ejection fraction was in the range of 65%   to 70%. Wall motion was normal; there were no regional wall   motion abnormalities. Features are consistent with a pseudonormal   left ventricular filling pattern, with concomitant abnormal   relaxation and increased filling pressure (grade 2 diastolic   dysfunction). - Mitral valve: Moderately calcified annulus. There was systolic   anterior motion. There was moderate regurgitation directed   posteriorly. - Pulmonary arteries: Systolic pressure was mildly increased.  Impressions:  - Compared to the prior study, there has been no significant   interval change.  Patient Profile     71 y.o. female w/ PMH of CAD, PVD, HTN, HLD, and Type 2 DM who presented to [facility] on 06/10/2017 for planned abdominal aortogram. Developed acute respiratory distress during the procedure with Cardiology being consulted for further evaluation.   Assessment & Plan    1. Acute Dyspnea/ Elevated Troponin - Etiology of previous dyspnea unclear but possibilities include ischemia mediated pulmonary edema, volume excess (although pt only received 500 cc saline), elevated blood pressure and COPD. However her troponin is minimally elevated and she has multiple risk factors. She also has significant peripheral vascular disease and will require surgery. I feel definitive evaluation is warranted. Plan cardiac catheterization today (renal function back to baseline); pt premedicated for dye allergy. The risks and benefits including myocardial infarction, CVA and death and worsening renal function discussed and patient agrees to proceed.  2. HTN - Blood pressure mildly elevated. Add amlodipine 5 mg daily and follow.  3. Anemia - Further evaluation  per primary care.  4. PVD - continue plavix and statin. - Vascular Surgery  following. May need surgical intervention following cardiac W/U.  5. Acute on chronic Stage 3 CKD - Renal function back to baseline; follow renal function following procedure.  6. Mitral Regurgitation - moderate by echo this admission. Will fu as outpt.  Signed, Kirk Ruths , MD 9:08 AM 06/14/2017

## 2017-06-14 NOTE — H&P (View-Only) (Signed)
Progress Note  Patient Name: Sherri Brooks Date of Encounter: 06/14/2017  Primary Cardiologist: Dr. Stanford Breed  Subjective   Pt without CP or dyspnea  Inpatient Medications    Scheduled Meds: . [START ON 06/16/2017] alendronate  70 mg Oral Q7 days  . clopidogrel  75 mg Oral Daily  . diphenhydrAMINE  50 mg Oral Once   Or  . diphenhydrAMINE  50 mg Intravenous Once  . heparin subcutaneous  5,000 Units Subcutaneous Q8H  . insulin aspart  0-5 Units Subcutaneous QHS  . insulin aspart  0-9 Units Subcutaneous TID WC  . insulin glargine  20 Units Subcutaneous QHS  . labetalol  300 mg Oral BID  . LORazepam  0.5 mg Oral QHS  . mouth rinse  15 mL Mouth Rinse BID  . methylPREDNISolone (SOLU-MEDROL) injection  40 mg Intravenous Q4H  . simvastatin  40 mg Oral QHS  . sodium chloride flush  3 mL Intravenous Q12H  . sodium chloride flush  3 mL Intravenous Q12H   Continuous Infusions: . sodium chloride    . sodium chloride    . sodium chloride    . sodium chloride 10 mL/hr (06/14/17 0609)   PRN Meds: sodium chloride, sodium chloride, acetaminophen **OR** acetaminophen, albuterol, bisacodyl, hydrALAZINE, HYDROcodone-acetaminophen, labetalol, ondansetron **OR** ondansetron (ZOFRAN) IV, sodium chloride flush, sodium chloride flush, traZODone   Vital Signs    Vitals:   06/13/17 1220 06/13/17 2204 06/13/17 2358 06/14/17 0423  BP: (!) 141/58 (!) 127/56 (!) 172/64 (!) 154/70  Pulse:      Resp: 17  18   Temp: 98.3 F (36.8 C) 98.6 F (37 C) 98.2 F (36.8 C) 98.1 F (36.7 C)  TempSrc: Oral Oral  Oral  SpO2: 100% 100% 96% 100%  Weight:    72.2 kg (159 lb 3.2 oz)  Height:        Intake/Output Summary (Last 24 hours) at 06/14/17 0908 Last data filed at 06/13/17 1230  Gross per 24 hour  Intake              480 ml  Output             1250 ml  Net             -770 ml   Filed Weights   06/12/17 0400 06/13/17 0401 06/14/17 0423  Weight: 75.8 kg (167 lb 1.6 oz) 73.7 kg (162 lb 6.4 oz)  72.2 kg (159 lb 3.2 oz)    Telemetry    NSR  - Personally Reviewed   Physical Exam   General: WD/WN NAD Head: Normal with normal eyelids Neck: Supple, no JVD Lungs:  CTA, no wheeze Heart: RRR, no murmur Abdomen: Soft, NT/ND Extremities: No edema. No defomity Neuro: no focal findings   Labs    Chemistry  Recent Labs Lab 06/10/17 1936  06/12/17 0416 06/13/17 1052 06/14/17 0235  NA 137  < > 140 137 139  K 3.4*  < > 3.8 4.0 3.7  CL 107  < > 110 107 113*  CO2 19*  < > 20* 21* 20*  GLUCOSE 188*  < > 112* 269* 139*  BUN 24*  < > 40* 30* 26*  CREATININE 1.56*  < > 1.65* 1.54* 1.47*  CALCIUM 8.9  < > 8.8* 8.5* 8.7*  PROT 7.7  --   --   --   --   ALBUMIN 3.3*  --   --   --   --   AST 32  --   --   --   --  ALT 18  --   --   --   --   ALKPHOS 67  --   --   --   --   BILITOT 0.9  --   --   --   --   GFRNONAA 32*  < > 30* 33* 34*  GFRAA 37*  < > 35* 38* 40*  ANIONGAP 11  < > 10 9 6   < > = values in this interval not displayed.   Hematology  Recent Labs Lab 06/11/17 0447 06/11/17 1619 06/12/17 0416 06/14/17 0505  WBC 8.4  --  11.0* 8.2  RBC 3.00*  --  3.37* 3.56*  HGB 7.3* 7.1* 8.7* 9.0*  HCT 22.7* 22.3* 26.6* 28.3*  MCV 75.7*  --  78.9 79.5  MCH 24.3*  --  25.8* 25.3*  MCHC 32.2  --  32.7 31.8  RDW 16.2*  --  17.1* 16.5*  PLT 339  --  311 308    Cardiac Enzymes  Recent Labs Lab 06/10/17 2304 06/11/17 0447 06/11/17 1152 06/11/17 1619  TROPONINI 0.20* 0.26* 0.27* 0.25*    BNP  Recent Labs Lab 06/10/17 1936  BNP 462.3*      Radiology    Dg Chest Port 1 View  Result Date: 06/10/2017 CLINICAL DATA:  71 year old female with shortness of breath. EXAM: PORTABLE CHEST 1 VIEW COMPARISON:  11/03/2012 FINDINGS: The heart size and mediastinal contours are within normal limits. Both lungs are clear. The visualized skeletal structures are unremarkable. IMPRESSION: No active disease. Electronically Signed   By: Kristopher Oppenheim M.D.   On: 06/10/2017 13:44     Cardiac Studies   Echocardiogram: 06/11/2017 Study Conclusions  - Left ventricle: The cavity size was normal. Wall thickness was   increased in a pattern of moderate LVH. Systolic function was   vigorous. The estimated ejection fraction was in the range of 65%   to 70%. Wall motion was normal; there were no regional wall   motion abnormalities. Features are consistent with a pseudonormal   left ventricular filling pattern, with concomitant abnormal   relaxation and increased filling pressure (grade 2 diastolic   dysfunction). - Mitral valve: Moderately calcified annulus. There was systolic   anterior motion. There was moderate regurgitation directed   posteriorly. - Pulmonary arteries: Systolic pressure was mildly increased.  Impressions:  - Compared to the prior study, there has been no significant   interval change.  Patient Profile     71 y.o. female w/ PMH of CAD, PVD, HTN, HLD, and Type 2 DM who presented to [facility] on 06/10/2017 for planned abdominal aortogram. Developed acute respiratory distress during the procedure with Cardiology being consulted for further evaluation.   Assessment & Plan    1. Acute Dyspnea/ Elevated Troponin - Etiology of previous dyspnea unclear but possibilities include ischemia mediated pulmonary edema, volume excess (although pt only received 500 cc saline), elevated blood pressure and COPD. However her troponin is minimally elevated and she has multiple risk factors. She also has significant peripheral vascular disease and will require surgery. I feel definitive evaluation is warranted. Plan cardiac catheterization today (renal function back to baseline); pt premedicated for dye allergy. The risks and benefits including myocardial infarction, CVA and death and worsening renal function discussed and patient agrees to proceed.  2. HTN - Blood pressure mildly elevated. Add amlodipine 5 mg daily and follow.  3. Anemia - Further evaluation  per primary care.  4. PVD - continue plavix and statin. - Vascular Surgery  following. May need surgical intervention following cardiac W/U.  5. Acute on chronic Stage 3 CKD - Renal function back to baseline; follow renal function following procedure.  6. Mitral Regurgitation - moderate by echo this admission. Will fu as outpt.  Signed, Kirk Ruths , MD 9:08 AM 06/14/2017

## 2017-06-15 ENCOUNTER — Other Ambulatory Visit: Payer: Self-pay | Admitting: Adult Health

## 2017-06-15 DIAGNOSIS — I209 Angina pectoris, unspecified: Secondary | ICD-10-CM

## 2017-06-15 DIAGNOSIS — I214 Non-ST elevation (NSTEMI) myocardial infarction: Secondary | ICD-10-CM

## 2017-06-15 LAB — BASIC METABOLIC PANEL
Anion gap: 9 (ref 5–15)
BUN: 29 mg/dL — AB (ref 6–20)
CALCIUM: 8.3 mg/dL — AB (ref 8.9–10.3)
CO2: 19 mmol/L — AB (ref 22–32)
Chloride: 106 mmol/L (ref 101–111)
Creatinine, Ser: 1.46 mg/dL — ABNORMAL HIGH (ref 0.44–1.00)
GFR calc Af Amer: 40 mL/min — ABNORMAL LOW (ref >60)
GFR, EST NON AFRICAN AMERICAN: 35 mL/min — AB (ref >60)
GLUCOSE: 275 mg/dL — AB (ref 65–99)
POTASSIUM: 3.7 mmol/L (ref 3.5–5.1)
Sodium: 134 mmol/L — ABNORMAL LOW (ref 135–145)

## 2017-06-15 LAB — GLUCOSE, CAPILLARY
Glucose-Capillary: 267 mg/dL — ABNORMAL HIGH (ref 65–99)
Glucose-Capillary: 252 mg/dL — ABNORMAL HIGH (ref 65–99)

## 2017-06-15 MED ORDER — ASPIRIN 81 MG PO CHEW
81.0000 mg | CHEWABLE_TABLET | Freq: Every day | ORAL | Status: DC
  Administered 2017-06-15: 81 mg via ORAL
  Filled 2017-06-15: qty 1

## 2017-06-15 MED ORDER — ATORVASTATIN CALCIUM 20 MG PO TABS: 20.0000 mg | ORAL_TABLET | Freq: Every day | ORAL | 0 refills | Status: AC

## 2017-06-15 MED ORDER — LABETALOL HCL 300 MG PO TABS: 300.0000 mg | ORAL_TABLET | Freq: Two times a day (BID) | ORAL | 0 refills | Status: AC

## 2017-06-15 MED ORDER — AMLODIPINE BESYLATE 5 MG PO TABS: 5.0000 mg | ORAL_TABLET | Freq: Every day | ORAL | 0 refills | Status: DC

## 2017-06-15 MED ORDER — INSULIN GLARGINE 100 UNIT/ML ~~LOC~~ SOLN: 20.0000 [IU] | Freq: Every day | SUBCUTANEOUS | 11 refills | Status: DC

## 2017-06-15 MED ORDER — ASPIRIN 81 MG PO CHEW: 81.0000 mg | CHEWABLE_TABLET | Freq: Every day | ORAL | 0 refills | Status: AC

## 2017-06-15 NOTE — Discharge Summary (Signed)
Physician Discharge Summary  Sherri Brooks QVZ:[id number] DOB: 06-May-1946 DOA: 06/10/2017  PCP: Jake Samples, PA-C  Admit date: 06/10/2017 Discharge date: 06/15/2017  Time spent: > 35 minutes  Recommendations for Outpatient Follow-up:  1. Please ensure follow up with cardiology 2. Ensure follow up with vascular surgery for repeat left lower extremity angiogram with aggressive attempt at stenting from her left common femoral to her below-knee popliteal artery 3. Monitor hgb levels 4. Follow serum creatinine levels last serum creatinine at 1.4   Discharge Diagnoses:  Principal Problem:   Dyspnea Active Problems:   Atherosclerosis of native arteries of the extremities with ulceration (HCC)   Diabetes mellitus with complication (HCC)   Tobacco use disorder   Anemia of unknown etiology   Acute kidney injury ([place])   Essential hypertension   PVD (peripheral vascular disease) ([place])   Discharge Condition: stable  Diet recommendation: heart healthy  Filed Weights   06/13/17 0401 06/14/17 0423 06/15/17 0230  Weight: 73.7 kg (162 lb 6.4 oz) 72.2 kg (159 lb 3.2 oz) 74 kg (163 lb 2.3 oz)    History of present illness:  71 y.o.femalewith medical history significant hypertension, diabetes, peripheral vascular disease status post left femoral to below knee popliteal artery bypass grafting 2011, hyperlipidemia, GERD, CAD underwent angiogram 8/27 and developed dyspnea.    We were subsequently consulted for further medical evaluation and recommendations.  Hospital Course:  #1. Dyspnea, thought 2/2 Possible COPD and Volume verload:  Likely multifactorial specifically mild exacerbation of undiagnosed COPD in setting of possible volume overload during angiogram and anemia. May have had "flash" pulmonary edema with elevated BP's requiring labetalol.  EKG without acute changes and her O2 sats were normal on 3 L.  - Echocardiogram per cards - EF 32-95%, grade 2 diastolic dysfunction (see  report) - Defer diuresis to cardiology. Lasix on hold. - Recommend outpatient pulmonary function tests - Patient is currently breathing comfortably on room air on day of discharge  #2. Troponemia:  Likely demand ischemia in setting of volume overload, elevated pro BNP. - echo with grade 2 diastolic dysfunction, EF 18-84% - cardiology performed cardiac cath which reported the following:  Cath reveals significant CAD and she is S/P PCI of RCA; Cardiology recommends continuing ASA, plavix and statin.  #3 Peripheral vascular disease: - Vascular on board who recommended follow up  for repeat left lower extremity angiogram with aggressive attempt at stenting from her left common femoral to her below-knee popliteal artery. They are to set up f/u appointment   # 4. Acute kidney injury. Baseline recently maybe 1.2?  Up to 1.8, with contrast and possible volume overload as noted above (vs also received lasix).  Appears euvolemic. - Holding lasix per cards and d/c HCTZ - Serum creatinine stable at 1.4  #5 Hypokalemia: WNL on last check.  #6. Hypertension.  - plan is for: amlodipine 5 mg daily and labetalol. HCTZ discontinued.  #7. Anemia.  - stable recommend further outpatient evaluation  5. Diabetes. Serum glucose 144 on admission. Home medications include Lantus 30 units at bedtime -lantus 20 units nightly, -Carb modified diet -Obtain a hemoglobin A1c 6.2  #6. Tobacco use. -Cessationcounseling   Procedures:  As above.S/P PCI of RCA  Consultations:  Cardiology   Vascular surgery  Discharge Exam: Vitals:   06/15/17 0230 06/15/17 0744  BP: (!) 158/40 (!) 142/75  Pulse: 92 81  Resp: 18 17  Temp: 98.5 F (36.9 C) 98.4 F (36.9 C)  SpO2: 97% 100%  General: Pt in nad. Alert and awake Cardiovascular: rrr, no rubs Respiratory: no increased wob, no wheezes  Discharge Instructions   Discharge Instructions    Call MD for:  difficulty breathing, headache or  visual disturbances    Complete by:  As directed    Call MD for:  persistant dizziness or light-headedness    Complete by:  As directed    Call MD for:  severe uncontrolled pain    Complete by:  As directed    Call MD for:  temperature >100.4    Complete by:  As directed    Diet - low sodium heart healthy    Complete by:  As directed    Discharge instructions    Complete by:  As directed    Please be sure to follow up with your cardiologist after hospital discharge. Please call their office to confirm follow up appointment date and time.   Increase activity slowly    Complete by:  As directed      Current Discharge Medication List    START taking these medications   Details  amLODipine (NORVASC) 5 MG tablet Take 1 tablet (5 mg total) by mouth daily. Qty: 30 tablet, Refills: 0    aspirin 81 MG chewable tablet Chew 1 tablet (81 mg total) by mouth daily. Qty: 30 tablet, Refills: 0    atorvastatin (LIPITOR) 20 MG tablet Take 1 tablet (20 mg total) by mouth at bedtime. Qty: 30 tablet, Refills: 0      CONTINUE these medications which have CHANGED   Details  insulin glargine (LANTUS) 100 UNIT/ML injection Inject 0.2 mLs (20 Units total) into the skin at bedtime. Qty: 10 mL, Refills: 11    labetalol (NORMODYNE) 300 MG tablet Take 1 tablet (300 mg total) by mouth 2 (two) times daily. Qty: 60 tablet, Refills: 0      CONTINUE these medications which have NOT CHANGED   Details  acetaminophen (TYLENOL) 500 MG tablet Take 500-1,000 mg by mouth 2 (two) times daily.    albuterol (PROVENTIL HFA;VENTOLIN HFA) 108 (90 BASE) MCG/ACT inhaler Inhale 2 puffs into the lungs every 6 (six) hours as needed. For bronchitis and coughing    alendronate (FOSAMAX) 70 MG tablet Take 70 mg by mouth every 7 (seven) days. On Mondays    clopidogrel (PLAVIX) 75 MG tablet Take 75 mg by mouth daily.      gabapentin (NEURONTIN) 100 MG capsule Take 100 mg by mouth 3 (three) times daily as needed (general  pain).    Iron-Vitamins (GERITOL COMPLETE PO) Take 1 tablet by mouth daily.     LORazepam (ATIVAN) 0.5 MG tablet Take 0.5 mg by mouth at bedtime.     Omega-3 Fatty Acids (FISH OIL) 1000 MG CPDR Take 1,000 mg by mouth daily.       STOP taking these medications     aspirin 325 MG EC tablet      hydrochlorothiazide (HYDRODIURIL) 25 MG tablet      simvastatin (ZOCOR) 40 MG tablet        Allergies  Allergen Reactions  . Iohexol      Code: HIVES, Desc: PT STATES SHE EXPERIENCED ITCHING W/ IV DYE IN PAST-ARS 01/21/10, Onset Date: [id number]       The results of significant diagnostics from this hospitalization (including imaging, microbiology, ancillary and laboratory) are listed below for reference.    Significant Diagnostic Studies: Dg Chest Port 1 View  Result Date: 06/10/2017 CLINICAL DATA:  71 year old female with shortness  of breath. EXAM: PORTABLE CHEST 1 VIEW COMPARISON:  11/03/2012 FINDINGS: The heart size and mediastinal contours are within normal limits. Both lungs are clear. The visualized skeletal structures are unremarkable. IMPRESSION: No active disease. Electronically Signed   By: Kristopher Oppenheim M.D.   On: 06/10/2017 13:44    Microbiology: No results found for this or any previous visit (from the past 240 hour(s)).   Labs: Basic Metabolic Panel:  Recent Labs Lab 06/11/17 0447 06/12/17 0416 06/13/17 1052 06/14/17 0235 06/15/17 0317  NA 137 140 137 139 134*  K 3.4* 3.8 4.0 3.7 3.7  CL 106 110 107 113* 106  CO2 21* 20* 21* 20* 19*  GLUCOSE 236* 112* 269* 139* 275*  BUN 34* 40* 30* 26* 29*  CREATININE 1.80* 1.65* 1.54* 1.47* 1.46*  CALCIUM 8.8* 8.8* 8.5* 8.7* 8.3*   Liver Function Tests:  Recent Labs Lab 06/10/17 1936  AST 32  ALT 18  ALKPHOS 67  BILITOT 0.9  PROT 7.7  ALBUMIN 3.3*   No results for input(s): LIPASE, AMYLASE in the last 168 hours. No results for input(s): AMMONIA in the last 168 hours. CBC:  Recent Labs Lab 06/10/17 1936  06/11/17 0447 06/11/17 1619 06/12/17 0416 06/14/17 0505  WBC 8.3 8.4  --  11.0* 8.2  HGB 7.8* 7.3* 7.1* 8.7* 9.0*  HCT 25.1* 22.7* 22.3* 26.6* 28.3*  MCV 77.0* 75.7*  --  78.9 79.5  PLT 376 339  --  311 308   Cardiac Enzymes:  Recent Labs Lab 06/10/17 1936 06/10/17 2304 06/11/17 0447 06/11/17 1152 06/11/17 1619  TROPONINI 0.13* 0.20* 0.26* 0.27* 0.25*   BNP: BNP (last 3 results)  Recent Labs  06/10/17 1936  BNP 462.3*    ProBNP (last 3 results) No results for input(s): PROBNP in the last 8760 hours.  CBG:  Recent Labs Lab 06/14/17 0614 06/14/17 1104 06/14/17 1631 06/14/17 1943 06/15/17 0555  GLUCAP 120* 139* 172* 216* 267*    Signed:  Velvet Bathe MD.  [facility] 06/15/2017, 11:24 AM

## 2017-06-15 NOTE — Progress Notes (Signed)
  Progress Note    06/15/2017 11:20 AM 1 Day Post-Op  Subjective:  Feeling much better today  Vitals:   06/15/17 0230 06/15/17 0744  BP: (!) 158/40 (!) 142/75  Pulse: 92 81  Resp: 18 17  Temp: 98.5 F (36.9 C) 98.4 F (36.9 C)  SpO2: 97% 100%    Physical Exam: aaox3 Groin site cdi Left tma site with lateral serous drainage  CBC    Component Value Date/Time   WBC 8.2 06/14/2017 0505   RBC 3.56 (L) 06/14/2017 0505   HGB 9.0 (L) 06/14/2017 0505   HCT 28.3 (L) 06/14/2017 0505   PLT 308 06/14/2017 0505   MCV 79.5 06/14/2017 0505   MCH 25.3 (L) 06/14/2017 0505   MCHC 31.8 06/14/2017 0505   RDW 16.5 (H) 06/14/2017 0505   LYMPHSABS 1.8 11/03/2012 1215   MONOABS 0.8 11/03/2012 1215   EOSABS 0.1 11/03/2012 1215   BASOSABS 0.0 11/03/2012 1215    BMET    Component Value Date/Time   NA 134 (L) 06/15/2017 0317   NA 140 08/22/2011 0924   K 3.7 06/15/2017 0317   K 3.8 08/22/2011 0924   CL 106 06/15/2017 0317   CL 107 08/22/2011 0924   CO2 19 (L) 06/15/2017 0317   CO2 25 08/22/2011 0924   GLUCOSE 275 (H) 06/15/2017 0317   BUN 29 (H) 06/15/2017 0317   CREATININE 1.46 (H) 06/15/2017 0317   CALCIUM 8.3 (L) 06/15/2017 0317   GFRNONAA 35 (L) 06/15/2017 0317   GFRAA 40 (L) 06/15/2017 0317    INR    Component Value Date/Time   INR 1.05 06/13/2017 1417     Intake/Output Summary (Last 24 hours) at 06/15/17 1120 Last data filed at 06/15/17 0746  Gross per 24 hour  Intake            846.1 ml  Output                0 ml  Net            846.1 ml     Assessment:  71 y.o. female is s/p cdb of left EIA and required cardiac cath post op now stable and ok for discharge from cardiology standpoint.   Plan: I reviewed her images with Dr. Scot Dock and discussed with her her limited options for left lower extremity revascularization to attempt to heal her transmetatarsal amputation. At this time she is okay for discharge from the vascular surgery standpoint. I'll set her up for  repeat left lower extremity angiogram with aggressive attempt at stenting from her left common femoral to her below-knee popliteal artery. She demonstrates good understanding and will be contacted by [facility] office next week for scheduling.   Mikayela Deats C. Donzetta Matters, MD [facility] Office: [phone] Pager: [phone]  06/15/2017 11:20 AM

## 2017-06-15 NOTE — Progress Notes (Signed)
Progress Note  Patient Name: Sherri Brooks Date of Encounter: 06/15/2017  Primary Cardiologist: Dr. Stanford Breed  Subjective   No chest pain or dyspnea  Inpatient Medications    Scheduled Meds: . active partnership for health of your heart book   Does not apply Once  . [START ON 06/16/2017] alendronate  70 mg Oral Q7 days  . amLODipine  5 mg Oral Daily  . atorvastatin  20 mg Oral QHS  . clopidogrel  75 mg Oral Daily  . heparin subcutaneous  5,000 Units Subcutaneous Q8H  . insulin aspart  0-5 Units Subcutaneous QHS  . insulin aspart  0-9 Units Subcutaneous TID WC  . insulin glargine  20 Units Subcutaneous QHS  . labetalol  300 mg Oral BID  . LORazepam  0.5 mg Oral QHS  . mouth rinse  15 mL Mouth Rinse BID  . sodium chloride flush  3 mL Intravenous Q12H   Continuous Infusions: . sodium chloride    . sodium chloride    . sodium chloride 72.2 mL/hr at 06/15/17 0100   PRN Meds: sodium chloride, acetaminophen **OR** acetaminophen, albuterol, bisacodyl, hydrALAZINE, HYDROcodone-acetaminophen, labetalol, ondansetron **OR** ondansetron (ZOFRAN) IV, sodium chloride flush, traZODone   Vital Signs    Vitals:   06/14/17 1859 06/14/17 1900 06/14/17 2000 06/15/17 0230  BP: (!) 167/71 (!) 170/59  (!) 158/40  Pulse: 83 83 89 92  Resp: 16 (!) 23 17 18   Temp: 97.6 F (36.4 C)   98.5 F (36.9 C)  TempSrc: Oral   Oral  SpO2: 97% 97% 99% 97%  Weight:    74 kg (163 lb 2.3 oz)  Height:        Intake/Output Summary (Last 24 hours) at 06/15/17 0706 Last data filed at 06/15/17 0100  Gross per 24 hour  Intake            486.1 ml  Output                0 ml  Net            486.1 ml   Filed Weights   06/13/17 0401 06/14/17 0423 06/15/17 0230  Weight: 73.7 kg (162 lb 6.4 oz) 72.2 kg (159 lb 3.2 oz) 74 kg (163 lb 2.3 oz)    Telemetry    NSR  - Personally Reviewed   Physical Exam   General: WD/WN sleeping Head: Normal  Neck: No JVD Lungs:  CTA, no wheeze or rhonchi Heart:  RRR Abdomen: Soft, NT/ND, no masses Extremities: No edema. No deformity. Radial cath site with no hematoma Neuro: grossly intact   Labs    Chemistry  Recent Labs Lab 06/10/17 1936  06/13/17 1052 06/14/17 0235 06/15/17 0317  NA 137  < > 137 139 134*  K 3.4*  < > 4.0 3.7 3.7  CL 107  < > 107 113* 106  CO2 19*  < > 21* 20* 19*  GLUCOSE 188*  < > 269* 139* 275*  BUN 24*  < > 30* 26* 29*  CREATININE 1.56*  < > 1.54* 1.47* 1.46*  CALCIUM 8.9  < > 8.5* 8.7* 8.3*  PROT 7.7  --   --   --   --   ALBUMIN 3.3*  --   --   --   --   AST 32  --   --   --   --   ALT 18  --   --   --   --   [facility]  67  --   --   --   --   BILITOT 0.9  --   --   --   --   GFRNONAA 32*  < > 33* 34* 35*  GFRAA 37*  < > 38* 40* 40*  ANIONGAP 11  < > 9 6 9   < > = values in this interval not displayed.   Hematology  Recent Labs Lab 06/11/17 0447 06/11/17 1619 06/12/17 0416 06/14/17 0505  WBC 8.4  --  11.0* 8.2  RBC 3.00*  --  3.37* 3.56*  HGB 7.3* 7.1* 8.7* 9.0*  HCT 22.7* 22.3* 26.6* 28.3*  MCV 75.7*  --  78.9 79.5  MCH 24.3*  --  25.8* 25.3*  MCHC 32.2  --  32.7 31.8  RDW 16.2*  --  17.1* 16.5*  PLT 339  --  311 308    Cardiac Enzymes  Recent Labs Lab 06/10/17 2304 06/11/17 0447 06/11/17 1152 06/11/17 1619  TROPONINI 0.20* 0.26* 0.27* 0.25*    BNP  Recent Labs Lab 06/10/17 1936  BNP 462.3*      Radiology    Dg Chest Port 1 View  Result Date: 06/10/2017 CLINICAL DATA:  71 year old female with shortness of breath. EXAM: PORTABLE CHEST 1 VIEW COMPARISON:  11/03/2012 FINDINGS: The heart size and mediastinal contours are within normal limits. Both lungs are clear. The visualized skeletal structures are unremarkable. IMPRESSION: No active disease. Electronically Signed   By: Kristopher Oppenheim M.D.   On: 06/10/2017 13:44    Cardiac Studies   Echocardiogram: 06/11/2017 Study Conclusions  - Left ventricle: The cavity size was normal. Wall thickness was   increased in a pattern  of moderate LVH. Systolic function was   vigorous. The estimated ejection fraction was in the range of 65%   to 70%. Wall motion was normal; there were no regional wall   motion abnormalities. Features are consistent with a pseudonormal   left ventricular filling pattern, with concomitant abnormal   relaxation and increased filling pressure (grade 2 diastolic   dysfunction). - Mitral valve: Moderately calcified annulus. There was systolic   anterior motion. There was moderate regurgitation directed   posteriorly. - Pulmonary arteries: Systolic pressure was mildly increased.  Impressions:  - Compared to the prior study, there has been no significant   interval change.  Patient Profile     71 y.o. female w/ PMH of CAD, PVD, HTN, HLD, and Type 2 DM who presented to [facility] on 06/10/2017 for planned abdominal aortogram. Developed acute respiratory distress during the procedure with Cardiology being consulted for further evaluation.   Assessment & Plan    1. Acute Dyspnea/ Elevated Troponin/CAD - Cath reveals significant CAD and she is S/P PCI of RCA; continue ASA, plavix and statin.  2. HTN - Blood pressure mildly elevated. Continue amlodipine 5 mg daily and labetalol and follow. HCTZ DCed. Adjust regimen as outpt.  3. Anemia - Further evaluation per primary care.  4. PVD - continue plavix and statin. - FU vascular surgery following DC.  5. Acute on chronic Stage 3 CKD - Renal function unchanged this AM but at risk for contrast nephropathy; needs BMET Tuesday.  6. Mitral Regurgitation - moderate by echo this admission. Will fu as outpt.  Pt can be DCed from a cardiac standpoint with BMET Tuesday and TOC appt 1 week; fu with me 8-12 weeks  Signed, Kirk Ruths , MD 7:06 AM 06/15/2017

## 2017-06-15 NOTE — Progress Notes (Signed)
TR BAND REMOVAL  LOCATION:    right radial  DEFLATED PER PROTOCOL:    Yes.    TIME BAND OFF / DRESSING APPLIED:    22:30   SITE UPON ARRIVAL:    Level 1   SITE AFTER BAND REMOVAL:    Level 1  CIRCULATION SENSATION AND MOVEMENT:    Within Normal Limits   Yes.    COMMENTS:   Post TR band instructions given. Pt tolerated well.

## 2017-06-18 ENCOUNTER — Encounter: Payer: Self-pay | Admitting: Vascular Surgery

## 2017-06-18 ENCOUNTER — Encounter (HOSPITAL_COMMUNITY): Payer: Self-pay | Admitting: Cardiology

## 2017-06-18 MED FILL — Lidocaine HCl Local Inj 2%: INTRAMUSCULAR | Qty: 10 | Status: AC

## 2017-06-19 ENCOUNTER — Ambulatory Visit: Payer: Medicare HMO | Admitting: Vascular Surgery

## 2017-06-19 ENCOUNTER — Encounter (HOSPITAL_COMMUNITY): Payer: Medicare HMO

## 2017-06-20 ENCOUNTER — Other Ambulatory Visit: Payer: Self-pay | Admitting: *Deleted

## 2017-06-20 ENCOUNTER — Telehealth: Payer: Self-pay | Admitting: Physician Assistant

## 2017-06-20 NOTE — Telephone Encounter (Signed)
Tried to call pt she is not available spoke with daughter, per caller. She will have her call back. No DPR on file.

## 2017-06-20 NOTE — Telephone Encounter (Signed)
TOC Patient-Please call Patient-Pt has an appointment on 06-27-17 with [facility].

## 2017-06-21 NOTE — Telephone Encounter (Signed)
Called voice mail phone- unable to leave message  Voice mail is full. The home phone is disconnected.

## 2017-06-24 NOTE — Telephone Encounter (Signed)
Attempted to contact patient, home # disconnected, mobile # mailbox full.

## 2017-06-26 ENCOUNTER — Telehealth: Payer: Self-pay | Admitting: Cardiology

## 2017-06-26 ENCOUNTER — Encounter (HOSPITAL_COMMUNITY): Payer: Medicare HMO

## 2017-06-26 NOTE — Telephone Encounter (Signed)
S.w pt she states that she is calling about her appt tomorrow, she may not be able to come to appt nut she states that she does not want to cancel right now she still has "one more person that she can call" for a ride. I have scheduled another appt on 07-12-17. Pt states that she will call back before the end of the day to cancel appt tomorrow if she needs to cancel.

## 2017-06-26 NOTE — Telephone Encounter (Signed)
New message ° ° ° ° °Returning a call to the nurse °

## 2017-06-27 ENCOUNTER — Ambulatory Visit (INDEPENDENT_AMBULATORY_CARE_PROVIDER_SITE_OTHER): Payer: Medicare HMO | Admitting: Physician Assistant

## 2017-06-27 ENCOUNTER — Encounter: Payer: Self-pay | Admitting: Physician Assistant

## 2017-06-27 VITALS — BP 130/58 | HR 64 | Resp 18 | Ht 63.0 in | Wt 167.6 lb

## 2017-06-27 DIAGNOSIS — I2510 Atherosclerotic heart disease of native coronary artery without angina pectoris: Secondary | ICD-10-CM | POA: Diagnosis not present

## 2017-06-27 DIAGNOSIS — I10 Essential (primary) hypertension: Secondary | ICD-10-CM

## 2017-06-27 DIAGNOSIS — I739 Peripheral vascular disease, unspecified: Secondary | ICD-10-CM

## 2017-06-27 DIAGNOSIS — D649 Anemia, unspecified: Secondary | ICD-10-CM

## 2017-06-27 DIAGNOSIS — E785 Hyperlipidemia, unspecified: Secondary | ICD-10-CM | POA: Diagnosis not present

## 2017-06-27 DIAGNOSIS — N183 Chronic kidney disease, stage 3 unspecified: Secondary | ICD-10-CM

## 2017-06-27 DIAGNOSIS — Z79899 Other long term (current) drug therapy: Secondary | ICD-10-CM

## 2017-06-27 NOTE — Progress Notes (Signed)
Cardiology Office Note    Date:  06/29/2017   ID:  Sherri, Brooks [birth year], MRN [id number]  PCP:  Sherri Samples, PA-C  Cardiologist:  Dr. Stanford Breed  Chief Complaint  Patient presents with  . Hospitalization Follow-up    followed by Dr. Stanford Breed, recent PCI    History of Present Illness:  Sherri Brooks is a 71 y.o. female with PMH of HTN, PVD, DM II, GERD, and CAD. Patient has been evaluated by Dr. Scot Dock in all vascular surgery for nonhealing wound on her left transmetatarsal. She underwent lower extremity arteriogram on 06/10/2017, near the end of the procedure, she developed respiratory distress. Cardiology service was consulted. She was given IV Lasix. Her heart rate was elevated, hemoglobin prior to the procedure was down to 7.5. Echocardiogram obtained on 06/03/2017 showed EF 65-70%, grade 2 DD, moderately calcified mitral valve with moderate regurgitation. Given minimally elevated troponin and her pulmonary edema, definitive evaluation was recommended. She underwent cardiac catheterization on 06/14/2017 which showed 80% ostial RCA lesion treated with Caryl Comes 3.0 x 12 mm DES, 70% ostial D1 lesion followed by an 80% lesion, 55% distal LAD lesion, 50% OM1 lesion, 55% ostial OM 2 lesion, mild 2+ MR, EF greater than 65%.  She presents today for cardiology office visit. She denies any further chest discomfort. She continued to have some degree of shortness of breath with strenuous activity, however no shortness of breath at rest. He has been compliant with aspirin and Plavix. We emphasized the need to take DAPT for at least one year. Otherwise, she does not have significant lower extremity edema, orthopnea or PND episodes. Given the recent anemia, I will recheck a CBC. I also wanted to check a basic metabolic panel to make sure her renal function is stable. Her transmetatarsal wound is no longer draining, it appears to be healing.   Past Medical History:  Diagnosis Date  .  Arthritis   . CAD (coronary artery disease)   . Cellulitis of buttock, left 2010  . Diabetes mellitus   . GERD (gastroesophageal reflux disease)   . Hyperlipidemia   . Hypertension   . MI (myocardial infarction) ([place])   . PVD (peripheral vascular disease) ([place])   . Wears glasses     Past Surgical History:  Procedure Laterality Date  . ABDOMINAL AORTAGRAM N/A 07/28/2012   Procedure: ABDOMINAL Maxcine Ham;  Surgeon: Angelia Mould, MD;  Location: [facility] CATH LAB;  Service: Cardiovascular;  Laterality: N/A;  . ABDOMINAL AORTOGRAM W/LOWER EXTREMITY N/A 06/10/2017   Procedure: ABDOMINAL AORTOGRAM W/LOWER EXTREMITY;  Surgeon: Angelia Mould, MD;  Location: [place] CV LAB;  Service: Cardiovascular;  Laterality: N/A;  . COLONOSCOPY  10/26/2011   Procedure: COLONOSCOPY;  Surgeon: Dorothyann Peng, MD;  [facility];  Service: Endoscopy;  Laterality: N/A;  1:30  . CORONARY STENT INTERVENTION N/A 06/14/2017   Procedure: CORONARY STENT INTERVENTION;  Surgeon: Leonie Man, MD;  Location: [place] CV LAB;  Service: Cardiovascular;  Laterality: N/A;  . FEMORAL-POPLITEAL BYPASS GRAFT  07/2010   pt has had 3 fem-pop bpg  . FEMORAL-POPLITEAL BYPASS GRAFT  11/04/2012   Procedure: BYPASS GRAFT FEMORAL-POPLITEAL ARTERY;  Surgeon: Angelia Mould, MD;  Location: [facility] OR;  Service: Vascular;  Laterality: Left;  Redo Left Femoral Popliteal Bypass with PTFE  . FOOT AMPUTATION  2010   left  . LEFT HEART CATH AND CORONARY ANGIOGRAPHY N/A 06/14/2017   Procedure: LEFT HEART CATH AND CORONARY ANGIOGRAPHY;  Surgeon: Ellyn Hack,  Leonie Green, MD;  Location: [place] CV LAB;  Service: Cardiovascular;  Laterality: N/A;  . LOWER EXTREMITY ANGIOGRAM Bilateral 07/28/2012   Procedure: LOWER EXTREMITY ANGIOGRAM;  Surgeon: Angelia Mould, MD;  Location: [facility] CATH LAB;  Service: Cardiovascular;  Laterality: Bilateral;  . LOWER EXTREMITY ANGIOGRAM Left 01/02/2016   Procedure: Lower Extremity  Angiogram;  Surgeon: Angelia Mould, MD;  Location: [place] CV LAB;  Service: Cardiovascular;  Laterality: Left;  . ORIF ANKLE FRACTURE Left 01/27/2013   Procedure: OPEN REDUCTION INTERNAL FIXATION (ORIF) ANKLE FRACTURE;  Surgeon: Wylene Simmer, MD;  Location: [place];  Service: Orthopedics;  Laterality: Left;  . PERCUTANEOUS STENT INTERVENTION Left 07/28/2012   Procedure: PERCUTANEOUS STENT INTERVENTION;  Surgeon: Angelia Mould, MD;  Location: [facility] CATH LAB;  Service: Cardiovascular;  Laterality: Left;  lt ext tiliac stentx1  . PERIPHERAL VASCULAR BALLOON ANGIOPLASTY Left 06/10/2017   Procedure: PERIPHERAL VASCULAR BALLOON ANGIOPLASTY;  Surgeon: Angelia Mould, MD;  Location: [place] CV LAB;  Service: Cardiovascular;  Laterality: Left;  external iliac  . PERIPHERAL VASCULAR CATHETERIZATION N/A 01/02/2016   Procedure: Abdominal Aortogram;  Surgeon: Angelia Mould, MD;  Location: [place] CV LAB;  Service: Cardiovascular;  Laterality: N/A;  . PERIPHERAL VASCULAR CATHETERIZATION Left 01/02/2016   Procedure: Peripheral Vascular Intervention;  Surgeon: Angelia Mould, MD;  Location: [place] CV LAB;  Service: Cardiovascular;  Laterality: Left;  lt ext iliac  . right common iliac stent  10/2009   Dr Doren Custard ([place])  . S/P Hysterecotmy     partial  . TONSILLECTOMY      Current Medications: Outpatient Medications Prior to Visit  Medication Sig Dispense Refill  . acetaminophen (TYLENOL) 500 MG tablet Take 500-1,000 mg by mouth 2 (two) times daily.    Marland Kitchen albuterol (PROVENTIL HFA;VENTOLIN HFA) 108 (90 BASE) MCG/ACT inhaler Inhale 2 puffs into the lungs every 6 (six) hours as needed. For bronchitis and coughing    . alendronate (FOSAMAX) 70 MG tablet Take 70 mg by mouth every 7 (seven) days. On Mondays    . amLODipine (NORVASC) 5 MG tablet Take 1 tablet (5 mg total) by mouth daily. 30 tablet 0  . aspirin 81 MG chewable tablet Chew 1 tablet (81 mg  total) by mouth daily. 30 tablet 0  . atorvastatin (LIPITOR) 20 MG tablet Take 1 tablet (20 mg total) by mouth at bedtime. 30 tablet 0  . clopidogrel (PLAVIX) 75 MG tablet Take 75 mg by mouth daily.      Marland Kitchen gabapentin (NEURONTIN) 100 MG capsule Take 100 mg by mouth 3 (three) times daily as needed (general pain).    . insulin glargine (LANTUS) 100 UNIT/ML injection Inject 0.2 mLs (20 Units total) into the skin at bedtime. 10 mL 11  . Iron-Vitamins (GERITOL COMPLETE PO) Take 1 tablet by mouth daily.     Marland Kitchen labetalol (NORMODYNE) 300 MG tablet Take 1 tablet (300 mg total) by mouth 2 (two) times daily. 60 tablet 0  . LORazepam (ATIVAN) 0.5 MG tablet Take 0.5 mg by mouth at bedtime.     . Omega-3 Fatty Acids (FISH OIL) 1000 MG CPDR Take 1,000 mg by mouth daily.      No facility-administered medications prior to visit.      Allergies:   Iohexol   Social History   Social History  . Marital status: Widowed    Spouse name: N/A  . Number of children: 3  . Years of education: N/A   Occupational  History  . retired; Product/process development scientist Retired   Social History Main Topics  . Smoking status: Former Smoker    Packs/day: 0.40    Years: 20.00    Types: Cigarettes    Start date: 11/04/2012    Quit date: 03/11/2016  . Smokeless tobacco: Never Used  . Alcohol use No  . Drug use: No  . Sexual activity: Not Asked   Other Topics Concern  . None   Social History Narrative  . None     Family History:  The patient's family history includes Alzheimer's disease in her mother; Brain cancer in her father; Cancer in her father; Diabetes in her mother; Heart disease in her unknown relative.   ROS:   Please see the history of present illness.    ROS All other systems reviewed and are negative.   PHYSICAL EXAM:   VS:  BP (!) 130/58   Pulse 64   Resp 18   Ht 5\' 3"  (1.6 m)   Wt 167 lb 9.6 oz (76 kg)   BMI 29.69 kg/m    GEN: Well nourished, well developed, in no acute distress  HEENT: normal    Neck: no JVD, carotid bruits, or masses Cardiac: RRR; no murmurs, rubs, or gallops,no edema  Respiratory:  clear to auscultation bilaterally, normal work of breathing GI: soft, nontender, nondistended, + BS MS: no deformity or atrophy  Skin: warm and dry, no rash Neuro:  Alert and Oriented x 3, Strength and sensation are intact Psych: euthymic mood, full affect  Wt Readings from Last 3 Encounters:  06/27/17 167 lb 9.6 oz (76 kg)  06/15/17 163 lb 2.3 oz (74 kg)  06/05/17 165 lb (74.8 kg)      Studies/Labs Reviewed:   EKG:  EKG is ordered today.  The ekg ordered today demonstrates Normal sinus rhythm, poor R wave progression in the anterior leads, otherwise no significant ST-T wave changes.  Recent Labs: 06/10/2017: ALT 18; B Natriuretic Peptide 462.3 06/27/2017: BUN 19; Creatinine, Ser 1.87; Hemoglobin 9.6; Platelets 304; Potassium 4.6; Sodium 140   Lipid Panel    Component Value Date/Time   CHOL  07/08/2010 0605    140        ATP III CLASSIFICATION:  <200     mg/dL   Desirable  200-239  mg/dL   Borderline High  >=240    mg/dL   High          TRIG 88 07/08/2010 0605   HDL 32 (L) 07/08/2010 0605   CHOLHDL 4.4 07/08/2010 0605   VLDL 18 07/08/2010 0605   LDLCALC  07/08/2010 0605    90        Total Cholesterol/HDL:CHD Risk Coronary Heart Disease Risk Table                     Men   Women  1/2 Average Risk   3.4   3.3  Average Risk       5.0   4.4  2 X Average Risk   9.6   7.1  3 X Average Risk  23.4   11.0        Use the calculated Patient Ratio above and the CHD Risk Table to determine the patient's CHD Risk.        ATP III CLASSIFICATION (LDL):  <100     mg/dL   Optimal  100-129  mg/dL   Near or Above  Optimal  130-159  mg/dL   Borderline  160-189  mg/dL   High  >190     mg/dL   Very High    Additional studies/ records that were reviewed today include:   Echo 06/11/2017 - Left ventricle: The cavity size was normal. Wall thickness was    increased in a pattern of moderate LVH. Systolic function was   vigorous. The estimated ejection fraction was in the range of 65%   to 70%. Wall motion was normal; there were no regional wall   motion abnormalities. Features are consistent with a pseudonormal   left ventricular filling pattern, with concomitant abnormal   relaxation and increased filling pressure (grade 2 diastolic   dysfunction). - Mitral valve: Moderately calcified annulus. There was systolic   anterior motion. There was moderate regurgitation directed   posteriorly. - Pulmonary arteries: Systolic pressure was mildly increased.  Impressions:  - Compared to the prior study, there has been no significant   interval change.   Cath 06/14/2017 Conclusion     Ost RCA lesion, 80 %stenosed.  A STENT (XIENCE DED) SIERRA 3.00 X 12 MM drug eluting stent was successfully placed. Post-dilated to 3.6 mm.  Post intervention, there is a 0% residual stenosis.  ______________________  Colon Flattery 1st Diag lesion, 70 %stenosed. 1st Diag lesion, 80 %stenosed.  Dist LAD lesion, 55 %stenosed - beyond both Diag branches.  Ost 1st Mrg to 1st Mrg lesion, 55 %stenosed. Ost 2nd Mrg to 2nd Mrg lesion, 55 %stenosed.  There is hyperdynamic left ventricular systolic function. The left ventricular ejection fraction is greater than 65% by visual estimate.  There is mild (2+) mitral regurgitation.   Severe ostial RCA disease - PCI with DES Severe ostial & proximal D1 (large vessel) - not favorable for PCI, but could consider PTCA. Moderate ostial OM2 & Om2. Normal EF Systemic HTN with mild-moderately elevated LVEDP.   Plan: Transfer to Post-procedure unit for post PCI care & TR Band Removal  Continue ASA/Plavix for minimum of 6 months - could temporarily hold Plavix at that time for procedure.  Continue aggressive RF modifications - BP control, lipids, glycemic control.  Monitor for contrast hypersensitivity.     ASSESSMENT:      1. Coronary artery disease involving native coronary artery of native heart without angina pectoris   2. Essential hypertension   3. Encounter for long-term (current) use of medications   4. Hyperlipidemia LDL goal <70   5. Anemia, unspecified type   6. CKD (chronic kidney disease), stage III   7. PVD (peripheral vascular disease) ([place])      PLAN:  In order of problems listed above:  1. CAD: Recently underwent PCI of RCA, she never truly had chest pain, her presenting symptom was weakness and shortness of breath. She is still have diagonal residual which is medically managed. She denies any obvious anginal symptoms at this time. She will need aspirin and Plavix for minimum 6 month after which time plavix could temporarily be held for procedure once approved by cardiologist, ideally she will need DAPT for 1 year  2. PVD: Recently underwent lower extremity angiography by Dr. Scot Dock, the transmetatarsal wound is healing.  3. Hypertension: Blood pressure well controlled today.  4. Hyperlipidemia: On Lipitor 20 mg daily, LDL goal < 70, will defer fasting lipid panel and LFT to primary care provider.  5. Anemia: Repeat CBC today  6. CKD stage III: Recheck basic metabolic panel today after recent cath and lower extremity angiography, need to  monitor for contrast-induced nephropathy    Medication Adjustments/Labs and Tests Ordered: Current medicines are reviewed at length with the patient today.  Concerns regarding medicines are outlined above.  Medication changes, Labs and Tests ordered today are listed in the Patient Instructions below. Patient Instructions  Medication Instructions:   No changes  Labwork:   Your physician recommends you have labwork today. This will be to check your blood count (CBC), electrolytes and kidney function (BMET).  Your physician recommends that you return for lab work in: 2 months. This will be a fasting test to check your cholesterol and liver  function.  Testing/Procedures:  none  Follow-Up:  3 months with Dr. Stanford Breed  If you need a refill on your cardiac medications before your next appointment, please call your pharmacy.      Hilbert Corrigan, [place]  06/29/2017 9:38 AM    [address], [place], [place]  [address] Phone: [phone]; Fax: [phone]

## 2017-06-27 NOTE — Patient Instructions (Signed)
Medication Instructions:   No changes  Labwork:   Your physician recommends you have labwork today. This will be to check your blood count (CBC), electrolytes and kidney function (BMET).  Your physician recommends that you return for lab work in: 2 months. This will be a fasting test to check your cholesterol and liver function.  Testing/Procedures:  none  Follow-Up:  3 months with Dr. Stanford Breed  If you need a refill on your cardiac medications before your next appointment, please call your pharmacy.

## 2017-06-27 NOTE — Telephone Encounter (Signed)
Lm2cb-check to see if she is coming to appt today

## 2017-06-27 NOTE — Telephone Encounter (Signed)
Daughter returning your call.

## 2017-06-27 NOTE — Telephone Encounter (Signed)
Pt came for appt with Isaac Laud

## 2017-06-28 LAB — BASIC METABOLIC PANEL
BUN/Creatinine Ratio: 10 — ABNORMAL LOW (ref 12–28)
BUN: 19 mg/dL (ref 8–27)
CO2: 18 mmol/L — ABNORMAL LOW (ref 20–29)
CREATININE: 1.87 mg/dL — AB (ref 0.57–1.00)
Calcium: 9.6 mg/dL (ref 8.7–10.3)
Chloride: 106 mmol/L (ref 96–106)
GFR calc Af Amer: 31 mL/min/{1.73_m2} — ABNORMAL LOW (ref >59)
GFR, EST NON AFRICAN AMERICAN: 26 mL/min/{1.73_m2} — AB (ref >59)
GLUCOSE: 171 mg/dL — AB (ref 65–99)
Potassium: 4.6 mmol/L (ref 3.5–5.2)
Sodium: 140 mmol/L (ref 134–144)

## 2017-06-28 LAB — CBC
HEMATOCRIT: 30.1 % — AB (ref 34.0–46.6)
HEMOGLOBIN: 9.6 g/dL — AB (ref 11.1–15.9)
MCH: 25.7 pg — AB (ref 26.6–33.0)
MCHC: 31.9 g/dL (ref 31.5–35.7)
MCV: 81 fL (ref 79–97)
Platelets: 304 10*3/uL (ref 150–379)
RBC: 3.74 x10E6/uL — AB (ref 3.77–5.28)
RDW: 17.4 % — ABNORMAL HIGH (ref 12.3–15.4)
WBC: 8.5 10*3/uL (ref 3.4–10.8)

## 2017-06-28 NOTE — Progress Notes (Signed)
Anemia improving, kidney function worsened but she is not on obvious nephrotoxic drug, maybe related to recent cath. Recommend repeat BMET in 1 week.

## 2017-06-29 ENCOUNTER — Encounter: Payer: Self-pay | Admitting: Physician Assistant

## 2017-07-01 ENCOUNTER — Telehealth: Payer: Self-pay

## 2017-07-01 NOTE — Telephone Encounter (Signed)
Received a call from patient about recent lab results.Results given.Advised to have repeat bmet done 07/05/17.Stated she will be admitted to [facility] hospital 9/20 she will have repeated then.

## 2017-07-03 ENCOUNTER — Ambulatory Visit (HOSPITAL_COMMUNITY)
Admission: RE | Admit: 2017-07-03 | Discharge: 2017-07-03 | Disposition: A | Discharge status: Home / Self Care | Payer: Medicare HMO | Source: Ambulatory Visit | Attending: Vascular Surgery | Admitting: Vascular Surgery

## 2017-07-03 ENCOUNTER — Telehealth: Payer: Self-pay | Admitting: Vascular Surgery

## 2017-07-03 ENCOUNTER — Encounter (HOSPITAL_COMMUNITY)
Admission: RE | Disposition: A | Discharge status: Home / Self Care | Payer: Self-pay | Source: Ambulatory Visit | Attending: Vascular Surgery

## 2017-07-03 DIAGNOSIS — Z7982 Long term (current) use of aspirin: Secondary | ICD-10-CM | POA: Diagnosis not present

## 2017-07-03 DIAGNOSIS — Z79899 Other long term (current) drug therapy: Secondary | ICD-10-CM | POA: Diagnosis not present

## 2017-07-03 DIAGNOSIS — I252 Old myocardial infarction: Secondary | ICD-10-CM | POA: Diagnosis not present

## 2017-07-03 DIAGNOSIS — Z794 Long term (current) use of insulin: Secondary | ICD-10-CM | POA: Insufficient documentation

## 2017-07-03 DIAGNOSIS — I2510 Atherosclerotic heart disease of native coronary artery without angina pectoris: Secondary | ICD-10-CM | POA: Diagnosis not present

## 2017-07-03 DIAGNOSIS — Z87891 Personal history of nicotine dependence: Secondary | ICD-10-CM | POA: Diagnosis not present

## 2017-07-03 DIAGNOSIS — E785 Hyperlipidemia, unspecified: Secondary | ICD-10-CM | POA: Insufficient documentation

## 2017-07-03 DIAGNOSIS — E119 Type 2 diabetes mellitus without complications: Secondary | ICD-10-CM | POA: Diagnosis not present

## 2017-07-03 DIAGNOSIS — I7092 Chronic total occlusion of artery of the extremities: Secondary | ICD-10-CM | POA: Diagnosis not present

## 2017-07-03 DIAGNOSIS — I998 Other disorder of circulatory system: Secondary | ICD-10-CM | POA: Diagnosis not present

## 2017-07-03 DIAGNOSIS — I10 Essential (primary) hypertension: Secondary | ICD-10-CM | POA: Diagnosis not present

## 2017-07-03 DIAGNOSIS — I70202 Unspecified atherosclerosis of native arteries of extremities, left leg: Secondary | ICD-10-CM | POA: Diagnosis not present

## 2017-07-03 DIAGNOSIS — Z7902 Long term (current) use of antithrombotics/antiplatelets: Secondary | ICD-10-CM | POA: Insufficient documentation

## 2017-07-03 HISTORY — PX: LOWER EXTREMITY ANGIOGRAM: SHX5508

## 2017-07-03 HISTORY — PX: LOWER EXTREMITY INTERVENTION: CATH118252

## 2017-07-03 LAB — POCT I-STAT, CHEM 8
BUN: 44 mg/dL — ABNORMAL HIGH (ref 6–20)
CALCIUM ION: 1.13 mmol/L — AB (ref 1.15–1.40)
CREATININE: 2 mg/dL — AB (ref 0.44–1.00)
Chloride: 108 mmol/L (ref 101–111)
Glucose, Bld: 222 mg/dL — ABNORMAL HIGH (ref 65–99)
HEMATOCRIT: 32 % — AB (ref 36.0–46.0)
HEMOGLOBIN: 10.9 g/dL — AB (ref 12.0–15.0)
Potassium: 3.8 mmol/L (ref 3.5–5.1)
SODIUM: 140 mmol/L (ref 135–145)
TCO2: 22 mmol/L (ref 22–32)

## 2017-07-03 LAB — GLUCOSE, CAPILLARY
GLUCOSE-CAPILLARY: 276 mg/dL — AB (ref 65–99)
GLUCOSE-CAPILLARY: 239 mg/dL — AB (ref 65–99)

## 2017-07-03 LAB — POCT ACTIVATED CLOTTING TIME
ACTIVATED CLOTTING TIME: 219 s
ACTIVATED CLOTTING TIME: 208 s
ACTIVATED CLOTTING TIME: 180 s
Activated Clotting Time: 191 seconds

## 2017-07-03 SURGERY — LOWER EXTREMITY INTERVENTION
Anesthesia: LOCAL | Laterality: Left

## 2017-07-03 MED ORDER — SODIUM CHLORIDE 0.9 % IV SOLN: INTRAVENOUS | Status: DC

## 2017-07-03 MED ORDER — SODIUM CHLORIDE 0.9% FLUSH: 3.0000 mL | INTRAVENOUS | Status: DC | PRN

## 2017-07-03 MED ORDER — HEPARIN (PORCINE) IN NACL 2-0.9 UNIT/ML-% IJ SOLN
INTRAMUSCULAR | Status: AC | PRN
  Administered 2017-07-03: 1000 mL via INTRA_ARTERIAL

## 2017-07-03 MED ORDER — SODIUM CHLORIDE 0.9 % WEIGHT BASED INFUSION: 1.0000 mL/kg/h | INTRAVENOUS | Status: DC

## 2017-07-03 MED ORDER — HEPARIN (PORCINE) IN NACL 2-0.9 UNIT/ML-% IJ SOLN
INTRAMUSCULAR | Status: AC
  Filled 2017-07-03: qty 1000

## 2017-07-03 MED ORDER — MORPHINE SULFATE (PF) 10 MG/ML IV SOLN: 2.0000 mg | INTRAVENOUS | Status: DC | PRN

## 2017-07-03 MED ORDER — HYDRALAZINE HCL 20 MG/ML IJ SOLN
INTRAMUSCULAR | Status: AC
  Filled 2017-07-03: qty 1

## 2017-07-03 MED ORDER — METHYLPREDNISOLONE SODIUM SUCC 125 MG IJ SOLR
125.0000 mg | INTRAMUSCULAR | Status: AC
  Administered 2017-07-03: 125 mg via INTRAVENOUS

## 2017-07-03 MED ORDER — LIDOCAINE HCL 2 % IJ SOLN
INTRAMUSCULAR | Status: AC
  Filled 2017-07-03: qty 10

## 2017-07-03 MED ORDER — FAMOTIDINE IN NACL 20-0.9 MG/50ML-% IV SOLN
INTRAVENOUS | Status: AC
  Administered 2017-07-03: 20 mg via INTRAVENOUS
  Filled 2017-07-03: qty 50

## 2017-07-03 MED ORDER — HEPARIN SODIUM (PORCINE) 1000 UNIT/ML IJ SOLN
INTRAMUSCULAR | Status: DC | PRN
  Administered 2017-07-03: 8000 [IU] via INTRAVENOUS

## 2017-07-03 MED ORDER — OXYCODONE HCL 5 MG PO TABS: 5.0000 mg | ORAL_TABLET | ORAL | Status: DC | PRN

## 2017-07-03 MED ORDER — SODIUM CHLORIDE 0.9% FLUSH: 3.0000 mL | Freq: Two times a day (BID) | INTRAVENOUS | Status: DC

## 2017-07-03 MED ORDER — METHYLPREDNISOLONE SODIUM SUCC 125 MG IJ SOLR
INTRAMUSCULAR | Status: AC
  Administered 2017-07-03: 125 mg via INTRAVENOUS
  Filled 2017-07-03: qty 2

## 2017-07-03 MED ORDER — FAMOTIDINE IN NACL 20-0.9 MG/50ML-% IV SOLN
20.0000 mg | INTRAVENOUS | Status: AC
  Administered 2017-07-03: 20 mg via INTRAVENOUS

## 2017-07-03 MED ORDER — LABETALOL HCL 5 MG/ML IV SOLN: 10.0000 mg | INTRAVENOUS | Status: DC | PRN

## 2017-07-03 MED ORDER — SODIUM CHLORIDE 0.9 % IV SOLN: 250.0000 mL | INTRAVENOUS | Status: DC | PRN

## 2017-07-03 MED ORDER — FENTANYL CITRATE (PF) 100 MCG/2ML IJ SOLN
INTRAMUSCULAR | Status: AC
  Filled 2017-07-03: qty 2

## 2017-07-03 MED ORDER — HYDRALAZINE HCL 20 MG/ML IJ SOLN: 5.0000 mg | INTRAMUSCULAR | Status: DC | PRN

## 2017-07-03 MED ORDER — IODIXANOL 320 MG/ML IV SOLN
INTRAVENOUS | Status: DC | PRN
  Administered 2017-07-03: 75 mL via INTRA_ARTERIAL

## 2017-07-03 MED ORDER — DIPHENHYDRAMINE HCL 50 MG/ML IJ SOLN
25.0000 mg | INTRAMUSCULAR | Status: AC
  Administered 2017-07-03: 25 mg via INTRAVENOUS

## 2017-07-03 MED ORDER — DIPHENHYDRAMINE HCL 50 MG/ML IJ SOLN
INTRAMUSCULAR | Status: AC
  Administered 2017-07-03: 25 mg via INTRAVENOUS
  Filled 2017-07-03: qty 1

## 2017-07-03 MED ORDER — FENTANYL CITRATE (PF) 100 MCG/2ML IJ SOLN
INTRAMUSCULAR | Status: DC | PRN
  Administered 2017-07-03: 50 ug via INTRAVENOUS

## 2017-07-03 MED ORDER — HEPARIN SODIUM (PORCINE) 1000 UNIT/ML IJ SOLN
INTRAMUSCULAR | Status: AC
  Filled 2017-07-03: qty 1

## 2017-07-03 MED ORDER — HYDRALAZINE HCL 20 MG/ML IJ SOLN
INTRAMUSCULAR | Status: DC | PRN
  Administered 2017-07-03: 10 mg via INTRAVENOUS

## 2017-07-03 SURGICAL SUPPLY — 25 items
BALLN STERLING OTW 5X220X150 (BALLOONS) ×2
BALLOON STERLING OTW 5X220X150 (BALLOONS) ×1 IMPLANT
CATH OMNI FLUSH 5F 65CM (CATHETERS) ×2 IMPLANT
CATH QUICKCROSS .035X135CM (MICROCATHETER) ×2 IMPLANT
CATH QUICKCROSS SUPP .035X90CM (MICROCATHETER) ×2 IMPLANT
CATH SOFT-VU 4F 65 STRAIGHT (CATHETERS) ×1 IMPLANT
CATH SOFT-VU STRAIGHT 4F 65CM (CATHETERS) ×1
COVER PRB 48X5XTLSCP FOLD TPE (BAG) ×1 IMPLANT
COVER PROBE 5X48 (BAG) ×1
DEVICE TORQUE .025-.038 (MISCELLANEOUS) ×2 IMPLANT
GUIDEWIRE ANGLED .035X260CM (WIRE) ×2 IMPLANT
KIT ENCORE 26 ADVANTAGE (KITS) ×2 IMPLANT
KIT MICROINTRODUCER STIFF 5F (SHEATH) ×2 IMPLANT
KIT PV (KITS) ×2 IMPLANT
SHEATH HIGHFLEX ANSEL 6FRX55 (SHEATH) ×2 IMPLANT
SHEATH PINNACLE 5F 10CM (SHEATH) ×2 IMPLANT
STENT VIABAHN 5X250X120 (Permanent Stent) ×2 IMPLANT
STENT VIABAHN 6X250X120 (Permanent Stent) ×2 IMPLANT
STENT VIABAHN 6X50X120 (Permanent Stent) ×2 IMPLANT
SYR MEDRAD MARK V 150ML (SYRINGE) ×2 IMPLANT
TRANSDUCER W/STOPCOCK (MISCELLANEOUS) ×2 IMPLANT
TRAY PV CATH (CUSTOM PROCEDURE TRAY) ×2 IMPLANT
WIRE BENTSON .035X145CM (WIRE) ×4 IMPLANT
WIRE G V18X300CM (WIRE) ×2 IMPLANT
WIRE ROSEN-J .035X260CM (WIRE) ×2 IMPLANT

## 2017-07-03 NOTE — Telephone Encounter (Signed)
Sched appt 08/16/17; lab at 9:00 and MD at 10:15. Spoke to pt's daughter.

## 2017-07-03 NOTE — H&P (Signed)
HP    History of Present Illness: This is a 71 y.o. female with failed bypasses on the left. Recently underwent balloon angioplasty of left EIA and subsequent pci of rca for acute respiratory distress post procedure. Has occluded left sfa and is here for attempted revascularization.  Past Medical History:  Diagnosis Date  . Arthritis   . CAD (coronary artery disease)   . Cellulitis of buttock, left 2010  . Diabetes mellitus   . GERD (gastroesophageal reflux disease)   . Hyperlipidemia   . Hypertension   . MI (myocardial infarction) ([place])   . PVD (peripheral vascular disease) ([place])   . Wears glasses     Past Surgical History:  Procedure Laterality Date  . ABDOMINAL AORTAGRAM N/A 07/28/2012   Procedure: ABDOMINAL Maxcine Ham;  Surgeon: Angelia Mould, MD;  Location: [facility] CATH LAB;  Service: Cardiovascular;  Laterality: N/A;  . ABDOMINAL AORTOGRAM W/LOWER EXTREMITY N/A 06/10/2017   Procedure: ABDOMINAL AORTOGRAM W/LOWER EXTREMITY;  Surgeon: Angelia Mould, MD;  Location: [place] CV LAB;  Service: Cardiovascular;  Laterality: N/A;  . COLONOSCOPY  10/26/2011   Procedure: COLONOSCOPY;  Surgeon: Dorothyann Peng, MD;  [facility];  Service: Endoscopy;  Laterality: N/A;  1:30  . CORONARY STENT INTERVENTION N/A 06/14/2017   Procedure: CORONARY STENT INTERVENTION;  Surgeon: Leonie Man, MD;  Location: [place] CV LAB;  Service: Cardiovascular;  Laterality: N/A;  . FEMORAL-POPLITEAL BYPASS GRAFT  07/2010   pt has had 3 fem-pop bpg  . FEMORAL-POPLITEAL BYPASS GRAFT  11/04/2012   Procedure: BYPASS GRAFT FEMORAL-POPLITEAL ARTERY;  Surgeon: Angelia Mould, MD;  Location: [facility] OR;  Service: Vascular;  Laterality: Left;  Redo Left Femoral Popliteal Bypass with PTFE  . FOOT AMPUTATION  2010   left  . LEFT HEART CATH AND CORONARY ANGIOGRAPHY N/A 06/14/2017   Procedure: LEFT HEART CATH AND CORONARY ANGIOGRAPHY;  Surgeon: Leonie Man, MD;  Location: [place]  CV LAB;  Service: Cardiovascular;  Laterality: N/A;  . LOWER EXTREMITY ANGIOGRAM Bilateral 07/28/2012   Procedure: LOWER EXTREMITY ANGIOGRAM;  Surgeon: Angelia Mould, MD;  Location: [facility] CATH LAB;  Service: Cardiovascular;  Laterality: Bilateral;  . LOWER EXTREMITY ANGIOGRAM Left 01/02/2016   Procedure: Lower Extremity Angiogram;  Surgeon: Angelia Mould, MD;  Location: [place] CV LAB;  Service: Cardiovascular;  Laterality: Left;  . ORIF ANKLE FRACTURE Left 01/27/2013   Procedure: OPEN REDUCTION INTERNAL FIXATION (ORIF) ANKLE FRACTURE;  Surgeon: Wylene Simmer, MD;  Location: [place];  Service: Orthopedics;  Laterality: Left;  . PERCUTANEOUS STENT INTERVENTION Left 07/28/2012   Procedure: PERCUTANEOUS STENT INTERVENTION;  Surgeon: Angelia Mould, MD;  Location: [facility] CATH LAB;  Service: Cardiovascular;  Laterality: Left;  lt ext tiliac stentx1  . PERIPHERAL VASCULAR BALLOON ANGIOPLASTY Left 06/10/2017   Procedure: PERIPHERAL VASCULAR BALLOON ANGIOPLASTY;  Surgeon: Angelia Mould, MD;  Location: [place] CV LAB;  Service: Cardiovascular;  Laterality: Left;  external iliac  . PERIPHERAL VASCULAR CATHETERIZATION N/A 01/02/2016   Procedure: Abdominal Aortogram;  Surgeon: Angelia Mould, MD;  Location: [place] CV LAB;  Service: Cardiovascular;  Laterality: N/A;  . PERIPHERAL VASCULAR CATHETERIZATION Left 01/02/2016   Procedure: Peripheral Vascular Intervention;  Surgeon: Angelia Mould, MD;  Location: [place] CV LAB;  Service: Cardiovascular;  Laterality: Left;  lt ext iliac  . right common iliac stent  10/2009   Dr Doren Custard ([place])  . S/P Hysterecotmy     partial  . TONSILLECTOMY  Allergies  Allergen Reactions  . Iohexol Hives     Code: HIVES, Desc: PT STATES SHE EXPERIENCED ITCHING W/ IV DYE IN PAST-ARS 01/21/10, Onset Date: [id number]     Prior to Admission medications   Medication Sig Start Date End Date Taking? Authorizing  Provider  acetaminophen (TYLENOL) 500 MG tablet Take 500-1,000 mg by mouth 2 (two) times daily.   Yes [provider]  albuterol (PROVENTIL HFA;VENTOLIN HFA) 108 (90 BASE) MCG/ACT inhaler Inhale 2 puffs into the lungs every 6 (six) hours as needed. For bronchitis and coughing   Yes [provider]  alendronate (FOSAMAX) 70 MG tablet Take 70 mg by mouth every 7 (seven) days. On Mondays 08/30/11  Yes [provider]  amLODipine (NORVASC) 5 MG tablet Take 1 tablet (5 mg total) by mouth daily. 06/16/17  Yes Velvet Bathe, MD  aspirin 81 MG chewable tablet Chew 1 tablet (81 mg total) by mouth daily. 06/16/17  Yes Velvet Bathe, MD  atorvastatin (LIPITOR) 20 MG tablet Take 1 tablet (20 mg total) by mouth at bedtime. Patient taking differently: Take 20 mg by mouth daily.  06/15/17  Yes Velvet Bathe, MD  clopidogrel (PLAVIX) 75 MG tablet Take 75 mg by mouth daily.     Yes [provider]  gabapentin (NEURONTIN) 100 MG capsule Take 100 mg by mouth 3 (three) times daily as needed (general pain).   Yes [provider]  insulin glargine (LANTUS) 100 UNIT/ML injection Inject 0.2 mLs (20 Units total) into the skin at bedtime. Patient taking differently: Inject 30 Units into the skin See admin instructions. Check blood sugar twice daily. If sugar is close to 200 take 30 units. 06/15/17  Yes Velvet Bathe, MD  Iron-Vitamins (GERITOL COMPLETE PO) Take 1 tablet by mouth daily.    Yes [provider]  labetalol (NORMODYNE) 300 MG tablet Take 1 tablet (300 mg total) by mouth 2 (two) times daily. 06/15/17  Yes Velvet Bathe, MD  LORazepam (ATIVAN) 0.5 MG tablet Take 0.5 mg by mouth at bedtime.    Yes [provider]  Omega-3 Fatty Acids (FISH OIL) 1000 MG CPDR Take 1,000 mg by mouth daily.    Yes [provider]    Social History   Social History  . Marital status: Widowed    Spouse name: N/A  . Number of children: 3  . Years of education: N/A    Occupational History  . retired; Product/process development scientist Retired   Social History Main Topics  . Smoking status: Former Smoker    Packs/day: 0.40    Years: 20.00    Types: Cigarettes    Start date: 11/04/2012    Quit date: 03/11/2016  . Smokeless tobacco: Never Used  . Alcohol use No  . Drug use: No  . Sexual activity: Not on file   Other Topics Concern  . Not on file   Social History Narrative  . No narrative on file     Family History  Problem Relation Age of Onset  . Brain cancer Father   . Cancer Father   . Diabetes Mother        many family members  . Alzheimer's disease Mother   . Heart disease Unknown     ROS: left lateral foot wound   Physical Examination  Vitals:   07/03/17 1057  BP: (!) 117/97  Pulse: 70  Temp: (!) 97.5 F (36.4 C)  SpO2: 100%   Body mass index is 29.58 kg/m.  Both feet  are warm and well-perfused.  No hematoma right groin.  The wound on the lateral aspect of her left transmetatarsal amputation is stable without erythema or drainage.  CBC    Component Value Date/Time   WBC 8.5 06/27/2017 1105   WBC 8.2 06/14/2017 0505   RBC 3.74 (L) 06/27/2017 1105   RBC 3.56 (L) 06/14/2017 0505   HGB 10.9 (L) 07/03/2017 1134   HGB 9.6 (L) 06/27/2017 1105   HCT 32.0 (L) 07/03/2017 1134   HCT 30.1 (L) 06/27/2017 1105   PLT 304 06/27/2017 1105   MCV 81 06/27/2017 1105   MCH 25.7 (L) 06/27/2017 1105   MCH 25.3 (L) 06/14/2017 0505   MCHC 31.9 06/27/2017 1105   MCHC 31.8 06/14/2017 0505   RDW 17.4 (H) 06/27/2017 1105   LYMPHSABS 1.8 11/03/2012 1215   MONOABS 0.8 11/03/2012 1215   EOSABS 0.1 11/03/2012 1215   BASOSABS 0.0 11/03/2012 1215    BMET    Component Value Date/Time   NA 140 07/03/2017 1134   NA 140 06/27/2017 1105   K 3.8 07/03/2017 1134   K 3.8 08/22/2011 0924   CL 108 07/03/2017 1134   CL 107 08/22/2011 0924   CO2 18 (L) 06/27/2017 1105   CO2 25 08/22/2011 0924   GLUCOSE 222 (H) 07/03/2017 1134   BUN 44 (H)  07/03/2017 1134   BUN 19 06/27/2017 1105   CREATININE 2.00 (H) 07/03/2017 1134   CALCIUM 9.6 06/27/2017 1105   GFRNONAA 26 (L) 06/27/2017 1105   GFRAA 31 (L) 06/27/2017 1105    COAGS: Lab Results  Component Value Date   INR 1.05 06/13/2017   INR 1.00 11/03/2012   INR 1.11 07/27/2010        ASSESSMENT/PLAN: This is a 71 y.o. female with wound on left lateral aspect of tma. Will attempt endovascular salvage procedure today.  Larcenia Holaday C. Donzetta Matters, MD [facility] Office: [phone] Pager: [phone]

## 2017-07-03 NOTE — Progress Notes (Signed)
Site area: rt groin fa sheath Site Prior to Removal:  Level 0 Pressure Applied For: 20 minutes Manual:   yes Patient Status During Pull:  stable Post Pull Site:  Level 0 Post Pull Instructions Given:  yes Post Pull Pulses Present: dopplered Dressing Applied:  Gauze and tegaderm Bedrest begins @ 1800 Comments:

## 2017-07-03 NOTE — Discharge Instructions (Signed)
Angiogram, Care After °This sheet gives you information about how to care for yourself after your procedure. Your health care provider may also give you more specific instructions. If you have problems or questions, contact your health care provider. °What can I expect after the procedure? °After the procedure, it is common to have bruising and tenderness at the catheter insertion area. °Follow these instructions at home: °Insertion site care °· Follow instructions from your health care provider about how to take care of your insertion site. Make sure you: °? Wash your hands with soap and water before you change your bandage (dressing). If soap and water are not available, use hand sanitizer. °? Change your dressing as told by your health care provider. °? Leave stitches (sutures), skin glue, or adhesive strips in place. These skin closures may need to stay in place for 2 weeks or longer. If adhesive strip edges start to loosen and curl up, you may trim the loose edges. Do not remove adhesive strips completely unless your health care provider tells you to do that. °· Do not take baths, swim, or use a hot tub until your health care provider approves. °· You may shower 24-48 hours after the procedure or as told by your health care provider. °? Gently wash the site with plain soap and water. °? Pat the area dry with a clean towel. °? Do not rub the site. This may cause bleeding. °· Do not apply powder or lotion to the site. Keep the site clean and dry. °· Check your insertion site every day for signs of infection. Check for: °? Redness, swelling, or pain. °? Fluid or blood. °? Warmth. °? Pus or a bad smell. °Activity °· Rest as told by your health care provider, usually for 1-2 days. °· Do not lift anything that is heavier than 10 lbs. (4.5 kg) or as told by your health care provider. °· Do not drive for 24 hours if you were given a medicine to help you relax (sedative). °· Do not drive or use heavy machinery while  taking prescription pain medicine. °General instructions °· Return to your normal activities as told by your health care provider, usually in about a week. Ask your health care provider what activities are safe for you. °· If the catheter site starts bleeding, lie flat and put pressure on the site. If the bleeding does not stop, get help right away. This is a medical emergency. °· Drink enough fluid to keep your urine clear or pale yellow. This helps flush the contrast dye from your body. °· Take over-the-counter and prescription medicines only as told by your health care provider. °· Keep all follow-up visits as told by your health care provider. This is important. °Contact a health care provider if: °· You have a fever or chills. °· You have redness, swelling, or pain around your insertion site. °· You have fluid or blood coming from your insertion site. °· The insertion site feels warm to the touch. °· You have pus or a bad smell coming from your insertion site. °· You have bruising around the insertion site. °· You notice blood collecting in the tissue around the catheter site (hematoma). The hematoma may be painful to the touch. °Get help right away if: °· You have severe pain at the catheter insertion area. °· The catheter insertion area swells very fast. °· The catheter insertion area is bleeding, and the bleeding does not stop when you hold steady pressure on the area. °·   The area near or just beyond the catheter insertion site becomes pale, cool, tingly, or numb. These symptoms may represent a serious problem that is an emergency. Do not wait to see if the symptoms will go away. Get medical help right away. Call your local emergency services (911 in the [place] Do not drive yourself to the hospital. Summary  After the procedure, it is common to have bruising and tenderness at the catheter insertion area.  After the procedure, it is important to rest and drink plenty of fluids.  Do not take baths,  swim, or use a hot tub until your health care provider says it is okay to do so. You may shower 24-48 hours after the procedure or as told by your health care provider.  If the catheter site starts bleeding, lie flat and put pressure on the site. If the bleeding does not stop, get help right away. This is a medical emergency. This information is not intended to replace advice given to you by your health care provider. Make sure you discuss any questions you have with your health care provider. Document Released: 04/19/2005 Document Revised: 09/05/2016 Document Reviewed: 09/05/2016 Elsevier Interactive Patient Education  2017 [employer]. Moderate Conscious Sedation, Adult, Care After These instructions provide you with information about caring for yourself after your procedure. Your health care provider may also give you more specific instructions. Your treatment has been planned according to current medical practices, but problems sometimes occur. Call your health care provider if you have any problems or questions after your procedure. What can I expect after the procedure? After your procedure, it is common:  To feel sleepy for several hours.  To feel clumsy and have poor balance for several hours.  To have poor judgment for several hours.  To vomit if you eat too soon.  Follow these instructions at home: For at least 24 hours after the procedure:   Do not: ? Participate in activities where you could fall or become injured. ? Drive. ? Use heavy machinery. ? Drink alcohol. ? Take sleeping pills or medicines that cause drowsiness. ? Make important decisions or sign legal documents. ? Take care of children on your own.  Rest. Eating and drinking  Follow the diet recommended by your health care provider.  If you vomit: ? Drink water, juice, or soup when you can drink without vomiting. ? Make sure you have little or no nausea before eating solid foods. General  instructions  Have a responsible adult stay with you until you are awake and alert.  Take over-the-counter and prescription medicines only as told by your health care provider.  If you smoke, do not smoke without supervision.  Keep all follow-up visits as told by your health care provider. This is important. Contact a health care provider if:  You keep feeling nauseous or you keep vomiting.  You feel light-headed.  You develop a rash.  You have a fever. Get help right away if:  You have trouble breathing. This information is not intended to replace advice given to you by your health care provider. Make sure you discuss any questions you have with your health care provider. Document Released: 07/22/2013 Document Revised: 03/05/2016 Document Reviewed: 01/21/2016 Elsevier Interactive Patient Education  [employer].

## 2017-07-03 NOTE — Op Note (Signed)
    Patient name: Sherri Brooks MRN: [id number] DOB: [birth year] Sex: female  07/03/2017 Pre-operative Diagnosis: critical left lower extremity ischemia Post-operative diagnosis:  Same Surgeon:  Erlene Quan C. Donzetta Matters, MD Procedure Performed: 1.  US guided cannulation of right common femoral artery 2.  Aortogram with left lower extremity angiogram 3.  Stent from left common femoral to tibioperoneal trunk with 5 x 270mm viabahn, 6 x 261mm viabahn and 6 x 32mm viabahn  Indications:  71 year old female with a history of left femoral to popliteal artery bypasses 2 that have both failed. She has a transmetatarsal amputation on the left the now has ulceration at the lateral aspect. She underwent balloon angioplasty of her external iliac artery stent recently and had complication of acute coronary syndrome requiring PCI of the right coronary artery. She is now here for possible stenting of the left lower extremity.  Findings: The aortic bifurcation is heavily calcified and was difficult to cross. The stent on the left side are all patent without flow-limiting stenosis. There is a 75% at least stenosis of the left profunda femoris artery. The SFA occludes mid thigh. The takeoff of the previous femoral-popliteal artery bypass was patent. This bypass occluded approximately 2 cm after takeoff and there was evidence of both bypasses distally into the tibioperoneal trunk. After stenting of the previous bypass with viabahn stents there was 0% flow-limiting stenosis and dominant runoff was via the peroneal artery to the level of the foot as preintervention.   Procedure:  The patient was identified in the holding area and taken to room 8.  The patient was then placed supine on the table and prepped and draped in the usual sterile fashion.  A time out was called.  Ultrasound was used to evaluate the right common femoral artery.  It was patent .  A digital ultrasound image was acquired.  A micropuncture needle was used to  access the right common femoral artery under ultrasound guidance.  An 018 wire was advanced without resistance and a micropuncture sheath was placed.  The 018 wire was removed and a benson wire was placed.  The micropuncture sheath was exchanged for a 5 french sheath. Initially we went directly up and over with Omni catheter and Bentson wire but could not get the Omni to track inferiorly we were within a stent strut. We then pulled back and used a Glidewire and straight catheter to get in the aorta and then performed aortogram with Omni catheter. We will then able to cross the bifurcation using the Glidewire and the Omni and exchanged for a quick cross catheter in place the Glidewire deep into the ultimately bypass graft and exchanged for Rosen wire and then exchanged for long 6 French Hyperflex sheath and the patient was heparinized. We then used quick cross catheter and Glidewire to get all the way through the previous bypass and confirmed intraluminal access in the tibioperoneal trunk. We then primarily stented from the tibioperoneal trunk to the common femoral artery with 3 successive viabahn stents. Completion angiogram demonstrated 0 stenosis where there was once and occluded bypass and there was brisk flow through the peroneal artery all the way to the level of the foot. Patient tolerated procedure well without immediate complication.    Contrast: 75cc  Brandon C. Donzetta Matters, MD [facility] Office: [phone] Pager: [phone]

## 2017-07-03 NOTE — Telephone Encounter (Signed)
-----   Message from Mena Goes, RN sent at 07/03/2017  3:25 PM EDT ----- Regarding: 4-6 weeks   ----- Message ----- From: Waynetta Sandy, MD Sent: 07/03/2017   3:03 PM To: [address]  Sherri Brooks [id number] 16-Mar-1946  07/03/2017 Pre-operative Diagnosis: critical left lower extremity ischemia  Surgeon:  Eda Paschal. Donzetta Matters, MD  Procedure Performed: 1.  US guided cannulation of right common femoral artery 2.  Aortogram with left lower extremity angiogram 3.  Stent from left common femoral to tibioperoneal trunk with 5 x 261mm viabahn, 6 x 272mm viabahn and 6 x 58mm viabahn  F/u with Dr. Scot Dock 4-6 weeks with ABI and left leg duplex

## 2017-07-04 ENCOUNTER — Encounter (HOSPITAL_COMMUNITY): Payer: Self-pay | Admitting: Vascular Surgery

## 2017-07-12 ENCOUNTER — Ambulatory Visit: Payer: Medicare HMO | Admitting: Physician Assistant

## 2017-07-16 DIAGNOSIS — I10 Essential (primary) hypertension: Secondary | ICD-10-CM | POA: Diagnosis not present

## 2017-07-16 DIAGNOSIS — D649 Anemia, unspecified: Secondary | ICD-10-CM | POA: Diagnosis not present

## 2017-07-16 DIAGNOSIS — Z6828 Body mass index (BMI) 28.0-28.9, adult: Secondary | ICD-10-CM | POA: Diagnosis not present

## 2017-07-16 DIAGNOSIS — G4700 Insomnia, unspecified: Secondary | ICD-10-CM | POA: Diagnosis not present

## 2017-07-16 DIAGNOSIS — Z23 Encounter for immunization: Secondary | ICD-10-CM | POA: Diagnosis not present

## 2017-07-20 ENCOUNTER — Encounter (HOSPITAL_COMMUNITY): Payer: Self-pay | Admitting: Emergency Medicine

## 2017-07-20 ENCOUNTER — Emergency Department (HOSPITAL_COMMUNITY)
Admission: EM | Admit: 2017-07-20 | Discharge: 2017-07-20 | Disposition: A | Discharge status: Home / Self Care | Payer: Medicare HMO | Attending: Emergency Medicine | Admitting: Emergency Medicine

## 2017-07-20 ENCOUNTER — Emergency Department (HOSPITAL_COMMUNITY): Payer: Medicare HMO

## 2017-07-20 DIAGNOSIS — Z87891 Personal history of nicotine dependence: Secondary | ICD-10-CM | POA: Diagnosis not present

## 2017-07-20 DIAGNOSIS — E119 Type 2 diabetes mellitus without complications: Secondary | ICD-10-CM | POA: Insufficient documentation

## 2017-07-20 DIAGNOSIS — Z794 Long term (current) use of insulin: Secondary | ICD-10-CM | POA: Insufficient documentation

## 2017-07-20 DIAGNOSIS — Z95820 Peripheral vascular angioplasty status with implants and grafts: Secondary | ICD-10-CM | POA: Insufficient documentation

## 2017-07-20 DIAGNOSIS — I10 Essential (primary) hypertension: Secondary | ICD-10-CM | POA: Insufficient documentation

## 2017-07-20 DIAGNOSIS — R600 Localized edema: Secondary | ICD-10-CM | POA: Diagnosis present

## 2017-07-20 DIAGNOSIS — Z8679 Personal history of other diseases of the circulatory system: Secondary | ICD-10-CM | POA: Diagnosis not present

## 2017-07-20 DIAGNOSIS — Z79899 Other long term (current) drug therapy: Secondary | ICD-10-CM | POA: Diagnosis not present

## 2017-07-20 DIAGNOSIS — R2242 Localized swelling, mass and lump, left lower limb: Secondary | ICD-10-CM | POA: Diagnosis not present

## 2017-07-20 DIAGNOSIS — M7989 Other specified soft tissue disorders: Secondary | ICD-10-CM

## 2017-07-20 DIAGNOSIS — I2510 Atherosclerotic heart disease of native coronary artery without angina pectoris: Secondary | ICD-10-CM | POA: Insufficient documentation

## 2017-07-20 DIAGNOSIS — Z7982 Long term (current) use of aspirin: Secondary | ICD-10-CM | POA: Diagnosis not present

## 2017-07-20 LAB — COMPREHENSIVE METABOLIC PANEL
ALK PHOS: 82 U/L (ref 38–126)
ALT: 17 U/L (ref 14–54)
AST: 24 U/L (ref 15–41)
Albumin: 3.4 g/dL — ABNORMAL LOW (ref 3.5–5.0)
Anion gap: 8 (ref 5–15)
BUN: 23 mg/dL — AB (ref 6–20)
CHLORIDE: 111 mmol/L (ref 101–111)
CO2: 20 mmol/L — AB (ref 22–32)
CREATININE: 1.31 mg/dL — AB (ref 0.44–1.00)
Calcium: 8.7 mg/dL — ABNORMAL LOW (ref 8.9–10.3)
GFR calc Af Amer: 46 mL/min — ABNORMAL LOW (ref >60)
GFR, EST NON AFRICAN AMERICAN: 40 mL/min — AB (ref >60)
Glucose, Bld: 68 mg/dL (ref 65–99)
Potassium: 4 mmol/L (ref 3.5–5.1)
SODIUM: 139 mmol/L (ref 135–145)
Total Bilirubin: 0.7 mg/dL (ref 0.3–1.2)
Total Protein: 7.4 g/dL (ref 6.5–8.1)

## 2017-07-20 LAB — CBC WITH DIFFERENTIAL/PLATELET
BASOS ABS: 0.1 10*3/uL (ref 0.0–0.1)
Basophils Relative: 1 %
EOS PCT: 3 %
Eosinophils Absolute: 0.3 10*3/uL (ref 0.0–0.7)
HCT: 27.5 % — ABNORMAL LOW (ref 36.0–46.0)
HEMOGLOBIN: 8.8 g/dL — AB (ref 12.0–15.0)
LYMPHS PCT: 20 %
Lymphs Abs: 1.5 10*3/uL (ref 0.7–4.0)
MCH: 26 pg (ref 26.0–34.0)
MCHC: 32 g/dL (ref 30.0–36.0)
MCV: 81.1 fL (ref 78.0–100.0)
Monocytes Absolute: 0.6 10*3/uL (ref 0.1–1.0)
Monocytes Relative: 8 %
NEUTROS PCT: 68 %
Neutro Abs: 5.2 10*3/uL (ref 1.7–7.7)
PLATELETS: 251 10*3/uL (ref 150–400)
RBC: 3.39 MIL/uL — AB (ref 3.87–5.11)
RDW: 20.6 % — ABNORMAL HIGH (ref 11.5–15.5)
WBC: 7.6 10*3/uL (ref 4.0–10.5)

## 2017-07-20 NOTE — Discharge Instructions (Signed)
Your ultrasound does not show blood clot. Your blood work is reassuring.  Please follow-up with Dr. Donzetta Matters this next week as scheduled.  Keep your legs elevated on 2 pillows at rest and in sleep. Wear compressing stockings.  Return for worsening symptoms, including fever, worsening swelling or pain, difficulty breathing or any other symptoms concerning to you

## 2017-07-20 NOTE — ED Triage Notes (Signed)
Pt reports vascular procedure completed x1 week ago on LLE. Pt reports increase in swelling. Pt denies pain,fever, injury and is able to bear weight on LLE.

## 2017-07-20 NOTE — ED Provider Notes (Signed)
[facility] DEPT Provider Note   CSN: [id number] Arrival date & time: 07/20/17  0813     History   Chief Complaint Chief Complaint  Patient presents with  . Leg Swelling    HPI Sherri Brooks is a 71 y.o. female.  The history is provided by the patient.   71 year old female who presents with left leg swelling. She has history of DM, HTN, PVD s/p stent to left common femoral to tibioperoneal trunk by Dr. Donzetta Matters on 07/03/17. States That since her surgery she has been having intermittent swelling of the left lower extremity. Denies any pain. States that typically swelling is worse when she is on her feet and walking more. Typically when she rests and sleeps in the late morning, her swelling is improved. Denies any fevers or chills, numbness or weakness, cough, shortness of breath or chest pain. No orthopnea or PND.  Past Medical History:  Diagnosis Date  . Arthritis   . CAD (coronary artery disease)   . Cellulitis of buttock, left 2010  . Diabetes mellitus   . GERD (gastroesophageal reflux disease)   . Hyperlipidemia   . Hypertension   . MI (myocardial infarction) ([place])   . PVD (peripheral vascular disease) ([place])   . Wears glasses     Patient Active Problem List   Diagnosis Date Noted  . Acute kidney injury ([place]) 06/10/2017  . Dyspnea 06/10/2017  . Essential hypertension 06/10/2017  . PVD (peripheral vascular disease) ([place])   . Ankle fracture, bimalleolar, closed 12/29/2012  . Anemia of unknown etiology 11/06/2012  . Atherosclerotic PVD with ulceration ([place]) 11/04/2012  . Diabetes mellitus with complication ([place]) 96/22/2979  . Tobacco use disorder 11/04/2012  . Open wound of left foot 11/04/2012  . Atherosclerosis of native arteries of the extremities with ulceration ([place]) 07/09/2012  . Weight loss, unintentional 09/18/2011  . Anorexia 09/18/2011  . Early satiety 09/18/2011  . 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/07/2011    Past Surgical History:  Procedure Laterality  Date  . ABDOMINAL AORTAGRAM N/A 07/28/2012   Procedure: ABDOMINAL Maxcine Ham;  Surgeon: Angelia Mould, MD;  Location: [facility] CATH LAB;  Service: Cardiovascular;  Laterality: N/A;  . ABDOMINAL AORTOGRAM W/LOWER EXTREMITY N/A 06/10/2017   Procedure: ABDOMINAL AORTOGRAM W/LOWER EXTREMITY;  Surgeon: Angelia Mould, MD;  Location: [place] [facility] CV LAB;  Service: Cardiovascular;  Laterality: N/A;  . COLONOSCOPY  10/26/2011   Procedure: COLONOSCOPY;  Surgeon: Dorothyann Peng, MD;  [facility];  Service: Endoscopy;  Laterality: N/A;  1:30  . CORONARY STENT INTERVENTION N/A 06/14/2017   Procedure: CORONARY STENT INTERVENTION;  Surgeon: Leonie Man, MD;  Location: [place] CV LAB;  Service: Cardiovascular;  Laterality: N/A;  . FEMORAL-POPLITEAL BYPASS GRAFT  07/2010   pt has had 3 fem-pop bpg  . FEMORAL-POPLITEAL BYPASS GRAFT  11/04/2012   Procedure: BYPASS GRAFT FEMORAL-POPLITEAL ARTERY;  Surgeon: Angelia Mould, MD;  Location: [facility] OR;  Service: Vascular;  Laterality: Left;  Redo Left Femoral Popliteal Bypass with PTFE  . FOOT AMPUTATION  2010   left  . LEFT HEART CATH AND CORONARY ANGIOGRAPHY N/A 06/14/2017   Procedure: LEFT HEART CATH AND CORONARY ANGIOGRAPHY;  Surgeon: Leonie Man, MD;  Location: [place] CV LAB;  Service: Cardiovascular;  Laterality: N/A;  . LOWER EXTREMITY ANGIOGRAM Bilateral 07/28/2012   Procedure: LOWER EXTREMITY ANGIOGRAM;  Surgeon: Angelia Mould, MD;  Location: [facility] CATH LAB;  Service: Cardiovascular;  Laterality: Bilateral;  . LOWER EXTREMITY ANGIOGRAM Left  01/02/2016   Procedure: Lower Extremity Angiogram;  Surgeon: Angelia Mould, MD;  Location: [place] CV LAB;  Service: Cardiovascular;  Laterality: Left;  . LOWER EXTREMITY ANGIOGRAM Left 07/03/2017   Procedure: Lower Extremity Angiogram;  Surgeon: Waynetta Sandy, MD;  Location: [place] CV LAB;  Service: Cardiovascular;  Laterality: Left;  . LOWER EXTREMITY  INTERVENTION Left 07/03/2017   Procedure: Lower Extremity Intervention;  Surgeon: Waynetta Sandy, MD;  Location: [place] CV LAB;  Service: Cardiovascular;  Laterality: Left;  . ORIF ANKLE FRACTURE Left 01/27/2013   Procedure: OPEN REDUCTION INTERNAL FIXATION (ORIF) ANKLE FRACTURE;  Surgeon: Wylene Simmer, MD;  Location: [facility];  Service: Orthopedics;  Laterality: Left;  . PERCUTANEOUS STENT INTERVENTION Left 07/28/2012   Procedure: PERCUTANEOUS STENT INTERVENTION;  Surgeon: Angelia Mould, MD;  Location: [facility] CATH LAB;  Service: Cardiovascular;  Laterality: Left;  lt ext tiliac stentx1  . PERIPHERAL VASCULAR BALLOON ANGIOPLASTY Left 06/10/2017   Procedure: PERIPHERAL VASCULAR BALLOON ANGIOPLASTY;  Surgeon: Angelia Mould, MD;  Location: [place] CV LAB;  Service: Cardiovascular;  Laterality: Left;  external iliac  . PERIPHERAL VASCULAR CATHETERIZATION N/A 01/02/2016   Procedure: Abdominal Aortogram;  Surgeon: Angelia Mould, MD;  Location: [place] CV LAB;  Service: Cardiovascular;  Laterality: N/A;  . PERIPHERAL VASCULAR CATHETERIZATION Left 01/02/2016   Procedure: Peripheral Vascular Intervention;  Surgeon: Angelia Mould, MD;  Location: [place] CV LAB;  Service: Cardiovascular;  Laterality: Left;  lt ext iliac  . right common iliac stent  10/2009   Dr Doren Custard ([place])  . S/P Hysterecotmy     partial  . TONSILLECTOMY      OB History    No data available       Home Medications    Prior to Admission medications   Medication Sig Start Date End Date Taking? Authorizing Provider  acetaminophen (TYLENOL) 500 MG tablet Take 500-1,000 mg by mouth 2 (two) times daily.    [provider]  albuterol (PROVENTIL HFA;VENTOLIN HFA) 108 (90 BASE) MCG/ACT inhaler Inhale 2 puffs into the lungs every 6 (six) hours as needed. For bronchitis and coughing    [provider]  alendronate (FOSAMAX) 70 MG tablet Take 70 mg by mouth  every 7 (seven) days. On Mondays 08/30/11   [provider]  amLODipine (NORVASC) 5 MG tablet Take 1 tablet (5 mg total) by mouth daily. 06/16/17   Velvet Bathe, MD  aspirin 81 MG chewable tablet Chew 1 tablet (81 mg total) by mouth daily. 06/16/17   Velvet Bathe, MD  atorvastatin (LIPITOR) 20 MG tablet Take 1 tablet (20 mg total) by mouth at bedtime. Patient taking differently: Take 20 mg by mouth daily.  06/15/17   Velvet Bathe, MD  clopidogrel (PLAVIX) 75 MG tablet Take 75 mg by mouth daily.      [provider]  gabapentin (NEURONTIN) 100 MG capsule Take 100 mg by mouth 3 (three) times daily as needed (general pain).    [provider]  insulin glargine (LANTUS) 100 UNIT/ML injection Inject 0.2 mLs (20 Units total) into the skin at bedtime. Patient taking differently: Inject 30 Units into the skin See admin instructions. Check blood sugar twice daily. If sugar is close to 200 take 30 units. 06/15/17   Velvet Bathe, MD  Iron-Vitamins (GERITOL COMPLETE PO) Take 1 tablet by mouth daily.     [provider]  labetalol (NORMODYNE) 300 MG tablet Take 1 tablet (300 mg total) by mouth  2 (two) times daily. 06/15/17   Velvet Bathe, MD  LORazepam (ATIVAN) 0.5 MG tablet Take 0.5 mg by mouth at bedtime.     [provider]  Omega-3 Fatty Acids (FISH OIL) 1000 MG CPDR Take 1,000 mg by mouth daily.     [provider]    Family History Family History  Problem Relation Age of Onset  . Brain cancer Father   . Cancer Father   . Diabetes Mother        many family members  . Alzheimer's disease Mother   . Heart disease Unknown     Social History Social History  Substance Use Topics  . Smoking status: Former Smoker    Packs/day: 0.40    Years: 20.00    Types: Cigarettes    Start date: 11/04/2012    Quit date: 03/11/2016  . Smokeless tobacco: Never Used  . Alcohol use No     Allergies   Iohexol   Review of Systems Review of Systems    Constitutional: Negative for fever.  Respiratory: Negative for shortness of breath.   Cardiovascular: Positive for leg swelling. Negative for chest pain.  Gastrointestinal: Negative for nausea and vomiting.  All other systems reviewed and are negative.    Physical Exam Updated Vital Signs BP (!) 155/70 (BP Location: Right Arm)   Pulse 71   Temp 97.8 F (36.6 C) (Oral)   Resp 16   Ht 5\' 3"  (1.6 m)   Wt 72.6 kg (160 lb)   SpO2 100%   BMI 28.34 kg/m   Physical Exam Physical Exam  Nursing note and vitals reviewed. Constitutional: Well developed, well nourished, non-toxic, and in no acute distress Head: Normocephalic and atraumatic.  Mouth/Throat: Oropharynx is clear and moist.  Neck: Normal range of motion. Neck supple.  Cardiovascular: Normal rate and regular rhythm.  +2 DP pulse on the LLE.  Pulmonary/Chest: Effort normal and breath sounds normal.  Abdominal: Soft. There is no tenderness. There is no rebound and no guarding.  Musculoskeletal: Normal range of motion. Left forefoot amputation. There is +1 pitting edema to the LLE up to the mid shin.  Neurological: Alert, no facial droop, fluent speech, moves all extremities symmetrically, sensation to light touch in tact in bilateral lower extremities, full strength ankle dorsi/plantarflexion bilaterally Skin: Skin is warm and dry.  Psychiatric: Cooperative   ED Treatments / Results  Labs (all labs ordered are listed, but only abnormal results are displayed) Labs Reviewed  CBC WITH DIFFERENTIAL/PLATELET - Abnormal; Notable for the following:       Result Value   RBC 3.39 (*)    Hemoglobin 8.8 (*)    HCT 27.5 (*)    RDW 20.6 (*)    All other components within normal limits  COMPREHENSIVE METABOLIC PANEL - Abnormal; Notable for the following:    CO2 20 (*)    BUN 23 (*)    Creatinine, Ser 1.31 (*)    Calcium 8.7 (*)    Albumin 3.4 (*)    GFR calc non Af Amer 40 (*)    GFR calc Af Amer 46 (*)    All other components  within normal limits    EKG  EKG Interpretation None       Radiology US Venous Img Lower Unilateral Left  Result Date: 07/20/2017 CLINICAL DATA:  Recent stenting of the left common femoral artery. Left leg swelling. EXAM: LEFT LOWER EXTREMITY VENOUS DOPPLER ULTRASOUND TECHNIQUE: Gray-scale sonography with graded compression, as well as color  Doppler and duplex ultrasound were performed to evaluate the lower extremity deep venous systems from the level of the common femoral vein and including the common femoral, femoral, profunda femoral, popliteal and calf veins including the posterior tibial, peroneal and gastrocnemius veins when visible. The superficial great saphenous vein was also interrogated. Spectral Doppler was utilized to evaluate flow at rest and with distal augmentation maneuvers in the common femoral, femoral and popliteal veins. COMPARISON:  None. FINDINGS: Contralateral Common Femoral Vein: Respiratory phasicity is normal and symmetric with the symptomatic side. No evidence of thrombus. Normal compressibility. Common Femoral Vein: No evidence of thrombus. Normal compressibility, respiratory phasicity and response to augmentation. Saphenofemoral Junction: No evidence of thrombus. Normal compressibility and flow on color Doppler imaging. Profunda Femoral Vein: No evidence of thrombus. Normal compressibility and flow on color Doppler imaging. Femoral Vein: No evidence of thrombus. Normal compressibility, respiratory phasicity and response to augmentation. Popliteal Vein: No evidence of thrombus. Normal compressibility, respiratory phasicity and response to augmentation. Calf Veins: No evidence of thrombus. Normal compressibility and flow on color Doppler imaging. Superficial Great Saphenous Vein: No evidence of thrombus. Normal compressibility and flow on color Doppler imaging. Venous Reflux:  None. Other Findings:  None. IMPRESSION: No evidence of DVT within the left lower extremity.  Electronically Signed   By: Lajean Manes M.D.   On: 07/20/2017 10:30    Procedures Procedures (including critical care time)  Medications Ordered in ED Medications - No data to display   Initial Impression / Assessment and Plan / ED Course  I have reviewed the triage vital signs and the nursing notes.  Pertinent labs & imaging results that were available during my care of the patient were reviewed by me and considered in my medical decision making (see chart for details).     Presents with intermittent left leg swelling since left common femoral stent placed 9/19 by Dr. Donzetta Matters. Well appearing. No acute distress. Vitals non-concerning.   Some pitting edema to unilateral left leg. There is a palpable DP pulse in the LLE. NV in tact. Leg seems well perfused, and not felt to be complication of stent. [place] LE ordered for evaluation of DVT.   [place] without DVT. No signs of cellulitis at this time, and this is intermittent process since surgery. Blood work with improved renal function and normal liver function. No evidence of other fluid overload symptoms to suggest diastolic heart failure.  Will elevated at rest, use compression stockings. Supportive care for now. She has follow-up with Dr. Donzetta Matters this week, which she is encourage to attend.  Strict return and follow-up instructions reviewed. She expressed understanding of all discharge instructions and felt comfortable with the plan of care.   Final Clinical Impressions(s) / ED Diagnoses   Final diagnoses:  Left leg swelling    New Prescriptions New Prescriptions   No medications on file     Forde Dandy, MD 07/20/17 1108

## 2017-07-20 NOTE — ED Notes (Signed)
Patient transported to Ultrasound 

## 2017-08-12 ENCOUNTER — Other Ambulatory Visit: Payer: Self-pay

## 2017-08-12 DIAGNOSIS — I739 Peripheral vascular disease, unspecified: Secondary | ICD-10-CM

## 2017-08-16 ENCOUNTER — Ambulatory Visit (INDEPENDENT_AMBULATORY_CARE_PROVIDER_SITE_OTHER): Admit: 2017-08-16 | Discharge: 2017-08-16 | Disposition: A | Discharge status: Home / Self Care | Payer: Medicare HMO

## 2017-08-16 ENCOUNTER — Ambulatory Visit (HOSPITAL_COMMUNITY)
Admit: 2017-08-16 | Discharge: 2017-08-16 | Disposition: A | Discharge status: Home / Self Care | Payer: Medicare HMO | Source: Ambulatory Visit | Attending: Vascular Surgery | Admitting: Vascular Surgery

## 2017-08-16 ENCOUNTER — Ambulatory Visit (INDEPENDENT_AMBULATORY_CARE_PROVIDER_SITE_OTHER): Payer: Medicare HMO | Admitting: Vascular Surgery

## 2017-08-16 ENCOUNTER — Encounter: Payer: Self-pay | Admitting: Vascular Surgery

## 2017-08-16 VITALS — BP 132/52 | HR 72 | Temp 97.1°F | Resp 16 | Ht 63.0 in | Wt 170.0 lb

## 2017-08-16 DIAGNOSIS — I739 Peripheral vascular disease, unspecified: Secondary | ICD-10-CM

## 2017-08-16 DIAGNOSIS — Z89432 Acquired absence of left foot: Secondary | ICD-10-CM | POA: Insufficient documentation

## 2017-08-16 DIAGNOSIS — Z95828 Presence of other vascular implants and grafts: Secondary | ICD-10-CM | POA: Insufficient documentation

## 2017-08-16 LAB — VAS US LOWER EXTREMITY ARTERIAL DUPLEX
RPOPDPSV: 68 cm/s
RSFMPSV: -58 cm/s
Right popliteal prox sys PSV: 67 cm/s
Right super femoral dist sys PSV: -69 cm/s
Right super femoral prox sys PSV: -62 cm/s

## 2017-08-16 NOTE — Progress Notes (Signed)
Patient ID: Sherri Brooks, female   DOB: 1946/03/14, 71 y.o.   MRN: [id number]  Reason for Consult: PAD (4-6 wk f/u ABI, LE Art L, S/p Aortogram with L LE Angiogram.)   Referred by Jake Samples, PA*  Subjective:     HPI:  Sherri Brooks is a 71 y.o. female with a history of meth multiple left lower semi-bypasses that have failed and she recently underwent stenting of her left common femoral artery to her tibioperoneal trunk with covered stents.  This was done for pain in her left lower extremity as well as a wound on the lateral aspect of her transmetatarsal reputation.  She is now feeling much better and is healing the wound on the left TMA.  She has no fevers or chills.  She is walking without limitation.  She has no right lower extremity symptoms.  Past Medical History:  Diagnosis Date  . Arthritis   . CAD (coronary artery disease)   . Cellulitis of buttock, left 2010  . Diabetes mellitus   . GERD (gastroesophageal reflux disease)   . Hyperlipidemia   . Hypertension   . MI (myocardial infarction) ([place])   . PVD (peripheral vascular disease) ([place])   . Wears glasses    Family History  Problem Relation Age of Onset  . Brain cancer Father   . Cancer Father   . Diabetes Mother        many family members  . Alzheimer's disease Mother   . Heart disease Unknown    Past Surgical History:  Procedure Laterality Date  . ABDOMINAL AORTAGRAM N/A 07/28/2012   Procedure: ABDOMINAL Maxcine Ham;  Surgeon: Angelia Mould, MD;  Location: [facility] CATH LAB;  Service: Cardiovascular;  Laterality: N/A;  . ABDOMINAL AORTOGRAM W/LOWER EXTREMITY N/A 06/10/2017   Procedure: ABDOMINAL AORTOGRAM W/LOWER EXTREMITY;  Surgeon: Angelia Mould, MD;  Location: [place] CV LAB;  Service: Cardiovascular;  Laterality: N/A;  . COLONOSCOPY  10/26/2011   Procedure: COLONOSCOPY;  Surgeon: Dorothyann Peng, MD;  [facility];  Service: Endoscopy;  Laterality: N/A;  1:30  . CORONARY STENT  INTERVENTION N/A 06/14/2017   Procedure: CORONARY STENT INTERVENTION;  Surgeon: Leonie Man, MD;  Location: [place] CV LAB;  Service: Cardiovascular;  Laterality: N/A;  . FEMORAL-POPLITEAL BYPASS GRAFT  07/2010   pt has had 3 fem-pop bpg  . FEMORAL-POPLITEAL BYPASS GRAFT  11/04/2012   Procedure: BYPASS GRAFT FEMORAL-POPLITEAL ARTERY;  Surgeon: Angelia Mould, MD;  Location: [facility] OR;  Service: Vascular;  Laterality: Left;  Redo Left Femoral Popliteal Bypass with PTFE  . FOOT AMPUTATION  2010   left  . LEFT HEART CATH AND CORONARY ANGIOGRAPHY N/A 06/14/2017   Procedure: LEFT HEART CATH AND CORONARY ANGIOGRAPHY;  Surgeon: Leonie Man, MD;  Location: [place] CV LAB;  Service: Cardiovascular;  Laterality: N/A;  . LOWER EXTREMITY ANGIOGRAM Bilateral 07/28/2012   Procedure: LOWER EXTREMITY ANGIOGRAM;  Surgeon: Angelia Mould, MD;  Location: [facility] CATH LAB;  Service: Cardiovascular;  Laterality: Bilateral;  . LOWER EXTREMITY ANGIOGRAM Left 01/02/2016   Procedure: Lower Extremity Angiogram;  Surgeon: Angelia Mould, MD;  Location: [place] CV LAB;  Service: Cardiovascular;  Laterality: Left;  . LOWER EXTREMITY ANGIOGRAM Left 07/03/2017   Procedure: Lower Extremity Angiogram;  Surgeon: Waynetta Sandy, MD;  Location: [place] CV LAB;  Service: Cardiovascular;  Laterality: Left;  . LOWER EXTREMITY INTERVENTION Left 07/03/2017   Procedure: Lower Extremity Intervention;  Surgeon: Servando Snare  Harrell Gave, MD;  Location: [place] CV LAB;  Service: Cardiovascular;  Laterality: Left;  . ORIF ANKLE FRACTURE Left 01/27/2013   Procedure: OPEN REDUCTION INTERNAL FIXATION (ORIF) ANKLE FRACTURE;  Surgeon: Wylene Simmer, MD;  Location: [place];  Service: Orthopedics;  Laterality: Left;  . PERCUTANEOUS STENT INTERVENTION Left 07/28/2012   Procedure: PERCUTANEOUS STENT INTERVENTION;  Surgeon: Angelia Mould, MD;  Location: [facility] CATH LAB;  Service:  Cardiovascular;  Laterality: Left;  lt ext tiliac stentx1  . PERIPHERAL VASCULAR BALLOON ANGIOPLASTY Left 06/10/2017   Procedure: PERIPHERAL VASCULAR BALLOON ANGIOPLASTY;  Surgeon: Angelia Mould, MD;  Location: [place] CV LAB;  Service: Cardiovascular;  Laterality: Left;  external iliac  . PERIPHERAL VASCULAR CATHETERIZATION N/A 01/02/2016   Procedure: Abdominal Aortogram;  Surgeon: Angelia Mould, MD;  Location: [place] CV LAB;  Service: Cardiovascular;  Laterality: N/A;  . PERIPHERAL VASCULAR CATHETERIZATION Left 01/02/2016   Procedure: Peripheral Vascular Intervention;  Surgeon: Angelia Mould, MD;  Location: [place] CV LAB;  Service: Cardiovascular;  Laterality: Left;  lt ext iliac  . right common iliac stent  10/2009   Dr Doren Custard ([place])  . S/P Hysterecotmy     partial  . TONSILLECTOMY      Short Social History:  Social History  Substance Use Topics  . Smoking status: Former Smoker    Packs/day: 0.40    Years: 20.00    Types: Cigarettes    Start date: 11/04/2012    Quit date: 03/11/2016  . Smokeless tobacco: Never Used  . Alcohol use No    Allergies  Allergen Reactions  . Iohexol Hives     Code: HIVES, Desc: PT STATES SHE EXPERIENCED ITCHING W/ IV DYE IN PAST-ARS 01/21/10, Onset Date: [id number]     Current Outpatient Prescriptions  Medication Sig Dispense Refill  . acetaminophen (TYLENOL) 500 MG tablet Take 500-1,000 mg by mouth 2 (two) times daily.    Marland Kitchen albuterol (PROVENTIL HFA;VENTOLIN HFA) 108 (90 BASE) MCG/ACT inhaler Inhale 2 puffs into the lungs every 6 (six) hours as needed. For bronchitis and coughing    . alendronate (FOSAMAX) 70 MG tablet Take 70 mg by mouth every 7 (seven) days. On Mondays    . amLODipine (NORVASC) 5 MG tablet Take 1 tablet (5 mg total) by mouth daily. 30 tablet 0  . aspirin 81 MG chewable tablet Chew 1 tablet (81 mg total) by mouth daily. 30 tablet 0  . atorvastatin (LIPITOR) 20 MG tablet Take 1 tablet (20 mg total) by  mouth at bedtime. (Patient taking differently: Take 20 mg by mouth daily. ) 30 tablet 0  . clopidogrel (PLAVIX) 75 MG tablet Take 75 mg by mouth daily.      Marland Kitchen gabapentin (NEURONTIN) 100 MG capsule Take 100 mg by mouth 3 (three) times daily as needed (general pain).    . insulin glargine (LANTUS) 100 UNIT/ML injection Inject 0.2 mLs (20 Units total) into the skin at bedtime. (Patient taking differently: Inject 30 Units into the skin See admin instructions. Check blood sugar twice daily. If sugar is close to 200 take 30 units.) 10 mL 11  . Iron-Vitamins (GERITOL COMPLETE PO) Take 1 tablet by mouth daily.     Marland Kitchen labetalol (NORMODYNE) 300 MG tablet Take 1 tablet (300 mg total) by mouth 2 (two) times daily. 60 tablet 0  . LORazepam (ATIVAN) 0.5 MG tablet Take 0.5 mg by mouth at bedtime.     . Omega-3 Fatty Acids (FISH OIL) 1000 MG CPDR  Take 1,000 mg by mouth daily.      No current facility-administered medications for this visit.     Review of Systems  Constitutional:  Constitutional negative. HENT: HENT negative.  Eyes: Eyes negative.  Respiratory: Positive for cough and shortness of breath.  Musculoskeletal: Musculoskeletal negative.  Skin: Positive for wound.  Neurological: Neurological negative. Hematologic: Hematologic/lymphatic negative.  Psychiatric: Psychiatric negative.        Objective:  Objective   Vitals:   08/16/17 1044  BP: (!) 132/52  Pulse: 72  Resp: 16  Temp: (!) 97.1 F (36.2 C)  SpO2: 100%  Weight: 170 lb (77.1 kg)  Height: 5\' 3"  (1.6 m)   Body mass index is 30.11 kg/m.  Physical Exam  Constitutional: She is oriented to person, place, and time. She appears well-developed.  HENT:  Head: Normocephalic.  Neck: Normal range of motion.  Cardiovascular: Normal rate.   Strong left dp signal  Pulmonary/Chest: Effort normal.  Abdominal: Soft.  Musculoskeletal: Normal range of motion.  Left tma with lateral scab where previously open wound. Left foot is warm    Neurological: She is alert and oriented to person, place, and time.  Skin: Skin is warm and dry.  Psychiatric: She has a normal mood and affect. Her behavior is normal. Thought content normal.    Data: I have independently interpreted her left lower extremity duplex which demonstrates a peak systolic velocity of 101 proximal to our stent with no evidence of restenosis within the stent and an ABI of 0.95 although with a monophasic waveform.     Assessment/Plan:     71 year old female follows up after endovascular revascularization of her left lower extremity.  She is now healing her wound and has no pain in her foot.  I will have her follow-up with Dr. Doren Custard in a few months with repeat ABI.  Should she have return of her pain or wound and then we will see her sooner.  He will continue Plavix and Lipitor.     Waynetta Sandy MD [facility]

## 2017-08-16 NOTE — Addendum Note (Signed)
Addended by: Lianne Cure A on: 08/16/2017 04:50 PM   Modules accepted: Orders

## 2017-08-25 DIAGNOSIS — R404 Transient alteration of awareness: Secondary | ICD-10-CM | POA: Diagnosis not present

## 2017-08-25 DIAGNOSIS — I130 Hypertensive heart and chronic kidney disease with heart failure and stage 1 through stage 4 chronic kidney disease, or unspecified chronic kidney disease: Secondary | ICD-10-CM | POA: Diagnosis not present

## 2017-08-25 DIAGNOSIS — B9689 Other specified bacterial agents as the cause of diseases classified elsewhere: Secondary | ICD-10-CM | POA: Diagnosis not present

## 2017-08-25 DIAGNOSIS — Z794 Long term (current) use of insulin: Secondary | ICD-10-CM | POA: Diagnosis not present

## 2017-08-25 DIAGNOSIS — R7309 Other abnormal glucose: Secondary | ICD-10-CM | POA: Diagnosis not present

## 2017-08-25 DIAGNOSIS — N179 Acute kidney failure, unspecified: Secondary | ICD-10-CM | POA: Diagnosis not present

## 2017-08-25 DIAGNOSIS — T68XXXA Hypothermia, initial encounter: Secondary | ICD-10-CM | POA: Diagnosis not present

## 2017-08-25 DIAGNOSIS — N390 Urinary tract infection, site not specified: Secondary | ICD-10-CM | POA: Diagnosis not present

## 2017-08-25 DIAGNOSIS — E11649 Type 2 diabetes mellitus with hypoglycemia without coma: Secondary | ICD-10-CM | POA: Diagnosis not present

## 2017-08-25 DIAGNOSIS — N183 Chronic kidney disease, stage 3 (moderate): Secondary | ICD-10-CM | POA: Diagnosis not present

## 2017-08-25 DIAGNOSIS — E1122 Type 2 diabetes mellitus with diabetic chronic kidney disease: Secondary | ICD-10-CM | POA: Diagnosis not present

## 2017-08-25 DIAGNOSIS — E162 Hypoglycemia, unspecified: Secondary | ICD-10-CM | POA: Diagnosis not present

## 2017-08-25 DIAGNOSIS — D649 Anemia, unspecified: Secondary | ICD-10-CM | POA: Diagnosis not present

## 2017-08-26 DIAGNOSIS — E162 Hypoglycemia, unspecified: Secondary | ICD-10-CM | POA: Diagnosis not present

## 2017-09-02 ENCOUNTER — Encounter: Payer: Self-pay | Admitting: Cardiology

## 2017-09-03 NOTE — Progress Notes (Deleted)
HPI: Follow-up coronary artery disease. Patient had lower extremity arteriogram August 2018 for peripheral vascular disease. She developed respiratory distress/pulmonary edema following the procedure. Echocardiogram August 2018 showed vigorous LV function, grade 2 diastolic dysfunction, moderate mitral regurgitation. Troponin mildly elevated. Cardiac catheterization August 2018 showed 80% ostial right coronary artery, 70% ostial first diagonal followed by 80% lesion, 55% distal LAD, 50% first obtuse marginal, 55% second obtuse marginal, 2+ mitral regurgitation and ejection fraction greater than 65%. Patient had PCI of RCA with drug-eluting stent. Carotid Dopplers August 2018 showed 40-59% right and 1-39% left stenosis. Since last seen,   Current Outpatient Medications  Medication Sig Dispense Refill  . acetaminophen (TYLENOL) 500 MG tablet Take 500-1,000 mg by mouth 2 (two) times daily.    Marland Kitchen albuterol (PROVENTIL HFA;VENTOLIN HFA) 108 (90 BASE) MCG/ACT inhaler Inhale 2 puffs into the lungs every 6 (six) hours as needed. For bronchitis and coughing    . alendronate (FOSAMAX) 70 MG tablet Take 70 mg by mouth every 7 (seven) days. On Mondays    . amLODipine (NORVASC) 5 MG tablet Take 1 tablet (5 mg total) by mouth daily. 30 tablet 0  . aspirin 81 MG chewable tablet Chew 1 tablet (81 mg total) by mouth daily. 30 tablet 0  . atorvastatin (LIPITOR) 20 MG tablet Take 1 tablet (20 mg total) by mouth at bedtime. (Patient taking differently: Take 20 mg by mouth daily. ) 30 tablet 0  . clopidogrel (PLAVIX) 75 MG tablet Take 75 mg by mouth daily.      Marland Kitchen gabapentin (NEURONTIN) 100 MG capsule Take 100 mg by mouth 3 (three) times daily as needed (general pain).    . insulin glargine (LANTUS) 100 UNIT/ML injection Inject 0.2 mLs (20 Units total) into the skin at bedtime. (Patient taking differently: Inject 30 Units into the skin See admin instructions. Check blood sugar twice daily. If sugar is close to 200  take 30 units.) 10 mL 11  . Iron-Vitamins (GERITOL COMPLETE PO) Take 1 tablet by mouth daily.     Marland Kitchen labetalol (NORMODYNE) 300 MG tablet Take 1 tablet (300 mg total) by mouth 2 (two) times daily. 60 tablet 0  . LORazepam (ATIVAN) 0.5 MG tablet Take 0.5 mg by mouth at bedtime.     . Omega-3 Fatty Acids (FISH OIL) 1000 MG CPDR Take 1,000 mg by mouth daily.      No current facility-administered medications for this visit.      Past Medical History:  Diagnosis Date  . Arthritis   . CAD (coronary artery disease)   . Cellulitis of buttock, left 2010  . Diabetes mellitus   . GERD (gastroesophageal reflux disease)   . Hyperlipidemia   . Hypertension   . MI (myocardial infarction) ([place])   . PVD (peripheral vascular disease) ([place])   . Wears glasses     Past Surgical History:  Procedure Laterality Date  . ABDOMINAL AORTAGRAM N/A 07/28/2012   Procedure: ABDOMINAL Maxcine Ham;  Surgeon: Angelia Mould, MD;  Location: [facility] CATH LAB;  Service: Cardiovascular;  Laterality: N/A;  . ABDOMINAL AORTOGRAM W/LOWER EXTREMITY N/A 06/10/2017   Procedure: ABDOMINAL AORTOGRAM W/LOWER EXTREMITY;  Surgeon: Angelia Mould, MD;  Location: [place] CV LAB;  Service: Cardiovascular;  Laterality: N/A;  . COLONOSCOPY  10/26/2011   Procedure: COLONOSCOPY;  Surgeon: Dorothyann Peng, MD;  [facility];  Service: Endoscopy;  Laterality: N/A;  1:30  . CORONARY STENT INTERVENTION N/A 06/14/2017   Procedure: CORONARY STENT  INTERVENTION;  Surgeon: Leonie Man, MD;  Location: [place] CV LAB;  Service: Cardiovascular;  Laterality: N/A;  . FEMORAL-POPLITEAL BYPASS GRAFT  07/2010   pt has had 3 fem-pop bpg  . FEMORAL-POPLITEAL BYPASS GRAFT  11/04/2012   Procedure: BYPASS GRAFT FEMORAL-POPLITEAL ARTERY;  Surgeon: Angelia Mould, MD;  Location: [facility] OR;  Service: Vascular;  Laterality: Left;  Redo Left Femoral Popliteal Bypass with PTFE  . FOOT AMPUTATION  2010   left  . LEFT HEART CATH AND  CORONARY ANGIOGRAPHY N/A 06/14/2017   Procedure: LEFT HEART CATH AND CORONARY ANGIOGRAPHY;  Surgeon: Leonie Man, MD;  Location: [place] CV LAB;  Service: Cardiovascular;  Laterality: N/A;  . LOWER EXTREMITY ANGIOGRAM Bilateral 07/28/2012   Procedure: LOWER EXTREMITY ANGIOGRAM;  Surgeon: Angelia Mould, MD;  Location: [facility] CATH LAB;  Service: Cardiovascular;  Laterality: Bilateral;  . LOWER EXTREMITY ANGIOGRAM Left 01/02/2016   Procedure: Lower Extremity Angiogram;  Surgeon: Angelia Mould, MD;  Location: [place] CV LAB;  Service: Cardiovascular;  Laterality: Left;  . LOWER EXTREMITY ANGIOGRAM Left 07/03/2017   Procedure: Lower Extremity Angiogram;  Surgeon: Waynetta Sandy, MD;  Location: [place] CV LAB;  Service: Cardiovascular;  Laterality: Left;  . LOWER EXTREMITY INTERVENTION Left 07/03/2017   Procedure: Lower Extremity Intervention;  Surgeon: Waynetta Sandy, MD;  Location: [place] CV LAB;  Service: Cardiovascular;  Laterality: Left;  . ORIF ANKLE FRACTURE Left 01/27/2013   Procedure: OPEN REDUCTION INTERNAL FIXATION (ORIF) ANKLE FRACTURE;  Surgeon: Wylene Simmer, MD;  Location: [place];  Service: Orthopedics;  Laterality: Left;  . PERCUTANEOUS STENT INTERVENTION Left 07/28/2012   Procedure: PERCUTANEOUS STENT INTERVENTION;  Surgeon: Angelia Mould, MD;  Location: [facility] CATH LAB;  Service: Cardiovascular;  Laterality: Left;  lt ext tiliac stentx1  . PERIPHERAL VASCULAR BALLOON ANGIOPLASTY Left 06/10/2017   Procedure: PERIPHERAL VASCULAR BALLOON ANGIOPLASTY;  Surgeon: Angelia Mould, MD;  Location: [place] CV LAB;  Service: Cardiovascular;  Laterality: Left;  external iliac  . PERIPHERAL VASCULAR CATHETERIZATION N/A 01/02/2016   Procedure: Abdominal Aortogram;  Surgeon: Angelia Mould, MD;  Location: [place] CV LAB;  Service: Cardiovascular;  Laterality: N/A;  . PERIPHERAL VASCULAR CATHETERIZATION Left  01/02/2016   Procedure: Peripheral Vascular Intervention;  Surgeon: Angelia Mould, MD;  Location: [place] CV LAB;  Service: Cardiovascular;  Laterality: Left;  lt ext iliac  . right common iliac stent  10/2009   Dr Doren Custard ([place])  . S/P Hysterecotmy     partial  . TONSILLECTOMY      Social History   Socioeconomic History  . Marital status: Widowed    Spouse name: Not on file  . Number of children: 3  . Years of education: Not on file  . Highest education level: Not on file  Social Needs  . Financial resource strain: Not on file  . Food insecurity - worry: Not on file  . Food insecurity - inability: Not on file  . Transportation needs - medical: Not on file  . Transportation needs - non-medical: Not on file  Occupational History  . Occupation: retired; Biochemist, clinical: RETIRED  Tobacco Use  . Smoking status: Former Smoker    Packs/day: 0.40    Years: 20.00    Pack years: 8.00    Types: Cigarettes    Start date: 11/04/2012    Last attempt to quit: 03/11/2016    Years since quitting: 1.4  . Smokeless  tobacco: Never Used  Substance and Sexual Activity  . Alcohol use: No    Alcohol/week: 0.0 oz  . Drug use: No  . Sexual activity: Not on file  Other Topics Concern  . Not on file  Social History Narrative  . Not on file    Family History  Problem Relation Age of Onset  . Brain cancer Father   . Cancer Father   . Diabetes Mother        many family members  . Alzheimer's disease Mother   . Heart disease Unknown     ROS: no fevers or chills, productive cough, hemoptysis, dysphasia, odynophagia, melena, hematochezia, dysuria, hematuria, rash, seizure activity, orthopnea, PND, pedal edema, claudication. Remaining systems are negative.  Physical Exam: Well-developed well-nourished in no acute distress.  Skin is warm and dry.  HEENT is normal.  Neck is supple.  Chest is clear to auscultation with normal expansion.  Cardiovascular exam is regular  rate and rhythm.  Abdominal exam nontender or distended. No masses palpated. Extremities show no edema. neuro grossly intact  ECG- personally reviewed  A/P  1 coronary artery disease-doing well with no chest pain. Continue aspirin, Plavix and statin.  2 carotid artery disease-plan follow-up carotid Dopplers August 2019.  3 hypertension-blood pressure is controlled. Continue present medications.  4 moderate mitral regurgitation-patient will need follow-up echo is in the future.  5 hyperlipidemia-continue statin.  6 peripheral vascular disease-managed by vascular surgery.  7 chronic stage III kidney disease-management per internal medicine.  Kirk Ruths, MD

## 2017-09-12 ENCOUNTER — Ambulatory Visit: Payer: Medicare HMO | Admitting: Cardiology

## 2017-09-24 ENCOUNTER — Encounter (HOSPITAL_COMMUNITY): Payer: Self-pay | Admitting: *Deleted

## 2017-09-25 DIAGNOSIS — M1711 Unilateral primary osteoarthritis, right knee: Secondary | ICD-10-CM | POA: Diagnosis not present

## 2017-09-25 DIAGNOSIS — M545 Low back pain: Secondary | ICD-10-CM | POA: Diagnosis not present

## 2017-09-30 NOTE — Progress Notes (Deleted)
HPI: Follow-up coronary artery disease. Patient had lower extremity arteriogram August 2018 for peripheral vascular disease. She developed respiratory distress/pulmonary edema following the procedure. Echocardiogram August 2018 showed vigorous LV function, grade 2 diastolic dysfunction, moderate mitral regurgitation. Troponin mildly elevated. Cardiac catheterization August 2018 showed 80% ostial right coronary artery, 70% ostial first diagonal followed by 80% lesion, 55% distal LAD, 50% first obtuse marginal, 55% second obtuse marginal, 2+ mitral regurgitation and ejection fraction greater than 65%. Patient had PCI of RCA with drug-eluting stent. Carotid Dopplers August 2018 showed 40-59% right and 1-39% left stenosis. Since last seen,   Current Outpatient Medications  Medication Sig Dispense Refill  . acetaminophen (TYLENOL) 500 MG tablet Take 500-1,000 mg by mouth 2 (two) times daily.    Marland Kitchen albuterol (PROVENTIL HFA;VENTOLIN HFA) 108 (90 BASE) MCG/ACT inhaler Inhale 2 puffs into the lungs every 6 (six) hours as needed. For bronchitis and coughing    . alendronate (FOSAMAX) 70 MG tablet Take 70 mg by mouth every 7 (seven) days. On Mondays    . amLODipine (NORVASC) 5 MG tablet Take 1 tablet (5 mg total) by mouth daily. 30 tablet 0  . aspirin 81 MG chewable tablet Chew 1 tablet (81 mg total) by mouth daily. 30 tablet 0  . atorvastatin (LIPITOR) 20 MG tablet Take 1 tablet (20 mg total) by mouth at bedtime. (Patient taking differently: Take 20 mg by mouth daily. ) 30 tablet 0  . clopidogrel (PLAVIX) 75 MG tablet Take 75 mg by mouth daily.      Marland Kitchen gabapentin (NEURONTIN) 100 MG capsule Take 100 mg by mouth 3 (three) times daily as needed (general pain).    . insulin glargine (LANTUS) 100 UNIT/ML injection Inject 0.2 mLs (20 Units total) into the skin at bedtime. (Patient taking differently: Inject 30 Units into the skin See admin instructions. Check blood sugar twice daily. If sugar is close to 200  take 30 units.) 10 mL 11  . Iron-Vitamins (GERITOL COMPLETE PO) Take 1 tablet by mouth daily.     Marland Kitchen labetalol (NORMODYNE) 300 MG tablet Take 1 tablet (300 mg total) by mouth 2 (two) times daily. 60 tablet 0  . LORazepam (ATIVAN) 0.5 MG tablet Take 0.5 mg by mouth at bedtime.     . Omega-3 Fatty Acids (FISH OIL) 1000 MG CPDR Take 1,000 mg by mouth daily.      No current facility-administered medications for this visit.      Past Medical History:  Diagnosis Date  . Arthritis   . CAD (coronary artery disease)   . Cellulitis of buttock, left 2010  . Diabetes mellitus   . GERD (gastroesophageal reflux disease)   . Hyperlipidemia   . Hypertension   . MI (myocardial infarction) ([place])   . PVD (peripheral vascular disease) ([place])   . Wears glasses     Past Surgical History:  Procedure Laterality Date  . ABDOMINAL AORTAGRAM N/A 07/28/2012   Procedure: ABDOMINAL Maxcine Ham;  Surgeon: Angelia Mould, MD;  Location: [facility] CATH LAB;  Service: Cardiovascular;  Laterality: N/A;  . ABDOMINAL AORTOGRAM W/LOWER EXTREMITY N/A 06/10/2017   Procedure: ABDOMINAL AORTOGRAM W/LOWER EXTREMITY;  Surgeon: Angelia Mould, MD;  Location: [place] CV LAB;  Service: Cardiovascular;  Laterality: N/A;  . COLONOSCOPY  10/26/2011   Procedure: COLONOSCOPY;  Surgeon: Dorothyann Peng, MD;  [facility];  Service: Endoscopy;  Laterality: N/A;  1:30  . CORONARY STENT INTERVENTION N/A 06/14/2017   Procedure: CORONARY STENT  INTERVENTION;  Surgeon: Leonie Man, MD;  Location: [place] CV LAB;  Service: Cardiovascular;  Laterality: N/A;  . FEMORAL-POPLITEAL BYPASS GRAFT  07/2010   pt has had 3 fem-pop bpg  . FEMORAL-POPLITEAL BYPASS GRAFT  11/04/2012   Procedure: BYPASS GRAFT FEMORAL-POPLITEAL ARTERY;  Surgeon: Angelia Mould, MD;  Location: [facility] OR;  Service: Vascular;  Laterality: Left;  Redo Left Femoral Popliteal Bypass with PTFE  . FOOT AMPUTATION  2010   left  . LEFT HEART CATH AND  CORONARY ANGIOGRAPHY N/A 06/14/2017   Procedure: LEFT HEART CATH AND CORONARY ANGIOGRAPHY;  Surgeon: Leonie Man, MD;  Location: [place] CV LAB;  Service: Cardiovascular;  Laterality: N/A;  . LOWER EXTREMITY ANGIOGRAM Bilateral 07/28/2012   Procedure: LOWER EXTREMITY ANGIOGRAM;  Surgeon: Angelia Mould, MD;  Location: [facility] CATH LAB;  Service: Cardiovascular;  Laterality: Bilateral;  . LOWER EXTREMITY ANGIOGRAM Left 01/02/2016   Procedure: Lower Extremity Angiogram;  Surgeon: Angelia Mould, MD;  Location: [place] CV LAB;  Service: Cardiovascular;  Laterality: Left;  . LOWER EXTREMITY ANGIOGRAM Left 07/03/2017   Procedure: Lower Extremity Angiogram;  Surgeon: Waynetta Sandy, MD;  Location: [place] CV LAB;  Service: Cardiovascular;  Laterality: Left;  . LOWER EXTREMITY INTERVENTION Left 07/03/2017   Procedure: Lower Extremity Intervention;  Surgeon: Waynetta Sandy, MD;  Location: [place] CV LAB;  Service: Cardiovascular;  Laterality: Left;  . ORIF ANKLE FRACTURE Left 01/27/2013   Procedure: OPEN REDUCTION INTERNAL FIXATION (ORIF) ANKLE FRACTURE;  Surgeon: Wylene Simmer, MD;  Location: [place];  Service: Orthopedics;  Laterality: Left;  . PERCUTANEOUS STENT INTERVENTION Left 07/28/2012   Procedure: PERCUTANEOUS STENT INTERVENTION;  Surgeon: Angelia Mould, MD;  Location: [facility] CATH LAB;  Service: Cardiovascular;  Laterality: Left;  lt ext tiliac stentx1  . PERIPHERAL VASCULAR BALLOON ANGIOPLASTY Left 06/10/2017   Procedure: PERIPHERAL VASCULAR BALLOON ANGIOPLASTY;  Surgeon: Angelia Mould, MD;  Location: [place] CV LAB;  Service: Cardiovascular;  Laterality: Left;  external iliac  . PERIPHERAL VASCULAR CATHETERIZATION N/A 01/02/2016   Procedure: Abdominal Aortogram;  Surgeon: Angelia Mould, MD;  Location: [place] CV LAB;  Service: Cardiovascular;  Laterality: N/A;  . PERIPHERAL VASCULAR CATHETERIZATION Left  01/02/2016   Procedure: Peripheral Vascular Intervention;  Surgeon: Angelia Mould, MD;  Location: [place] CV LAB;  Service: Cardiovascular;  Laterality: Left;  lt ext iliac  . right common iliac stent  10/2009   Dr Doren Custard ([place])  . S/P Hysterecotmy     partial  . TONSILLECTOMY      Social History   Socioeconomic History  . Marital status: Widowed    Spouse name: Not on file  . Number of children: 3  . Years of education: Not on file  . Highest education level: Not on file  Social Needs  . Financial resource strain: Not on file  . Food insecurity - worry: Not on file  . Food insecurity - inability: Not on file  . Transportation needs - medical: Not on file  . Transportation needs - non-medical: Not on file  Occupational History  . Occupation: retired; Biochemist, clinical: RETIRED  Tobacco Use  . Smoking status: Former Smoker    Packs/day: 0.40    Years: 20.00    Pack years: 8.00    Types: Cigarettes    Start date: 11/04/2012    Last attempt to quit: 03/11/2016    Years since quitting: 1.5  . Smokeless  tobacco: Never Used  Substance and Sexual Activity  . Alcohol use: No    Alcohol/week: 0.0 oz  . Drug use: No  . Sexual activity: Not on file  Other Topics Concern  . Not on file  Social History Narrative  . Not on file    Family History  Problem Relation Age of Onset  . Brain cancer Father   . Cancer Father   . Diabetes Mother        many family members  . Alzheimer's disease Mother   . Heart disease Unknown     ROS: no fevers or chills, productive cough, hemoptysis, dysphasia, odynophagia, melena, hematochezia, dysuria, hematuria, rash, seizure activity, orthopnea, PND, pedal edema, claudication. Remaining systems are negative.  Physical Exam: Well-developed well-nourished in no acute distress.  Skin is warm and dry.  HEENT is normal.  Neck is supple.  Chest is clear to auscultation with normal expansion.  Cardiovascular exam is regular  rate and rhythm.  Abdominal exam nontender or distended. No masses palpated. Extremities show no edema. neuro grossly intact  ECG- personally reviewed  A/P  1 coronary artery disease-doing well with no chest pain. Continue aspirin, Plavix and statin.  2 carotid artery disease-plan follow-up carotid Dopplers August 2019.  3 hypertension-blood pressure is controlled. Continue present medications.  4 moderate mitral regurgitation-patient will need follow-up echo is in the future.  5 hyperlipidemia-continue statin.  6 peripheral vascular disease-managed by vascular surgery.  7 chronic stage III kidney disease-management per internal medicine.  Kirk Ruths, MD

## 2017-10-01 ENCOUNTER — Encounter (HOSPITAL_COMMUNITY): Payer: Self-pay | Admitting: *Deleted

## 2017-10-10 ENCOUNTER — Ambulatory Visit: Payer: Medicare HMO | Admitting: Cardiology

## 2017-10-11 ENCOUNTER — Encounter: Payer: Self-pay | Admitting: *Deleted

## 2017-12-04 ENCOUNTER — Encounter (HOSPITAL_COMMUNITY): Payer: Medicare HMO

## 2017-12-04 ENCOUNTER — Ambulatory Visit: Payer: Medicare HMO | Admitting: Vascular Surgery

## 2018-01-08 ENCOUNTER — Ambulatory Visit: Payer: Medicare HMO | Admitting: Vascular Surgery

## 2018-01-08 ENCOUNTER — Encounter: Payer: Self-pay | Admitting: Vascular Surgery

## 2018-01-08 ENCOUNTER — Other Ambulatory Visit: Payer: Self-pay

## 2018-01-08 ENCOUNTER — Ambulatory Visit (HOSPITAL_COMMUNITY)
Admission: RE | Admit: 2018-01-08 | Discharge: 2018-01-08 | Disposition: A | Discharge status: Home / Self Care | Payer: Medicare HMO | Source: Ambulatory Visit | Attending: Vascular Surgery | Admitting: Vascular Surgery

## 2018-01-08 VITALS — BP 145/63 | HR 64 | Temp 97.7°F | Resp 16 | Ht 63.0 in | Wt 167.0 lb

## 2018-01-08 DIAGNOSIS — I739 Peripheral vascular disease, unspecified: Secondary | ICD-10-CM

## 2018-01-08 NOTE — Progress Notes (Signed)
Patient name: Sherri Brooks MRN: [id number] DOB: 1945-12-29 Sex: female  REASON FOR VISIT:   Follow-up of critical limb ischemia of left lower extremity.  HPI:   Sherri Brooks is a pleasant 72 y.o. female who is had 2 previous left femoropopliteal bypasses which have both failed.  She had a transmetatarsal amputation of the left which developed ulceration on the lateral aspect.  She required PCI of the right coronary artery for acute coronary syndrome recently.  From our standpoint, on 07/03/2017, she underwent angioplasty and stenting of the left common femoral artery to the tibial peroneal trunk by Dr. Servando Snare.  She comes in for a follow-up visit.  Today, she has no specific complaints.  The wound on her left transmetatarsal amputation site is healed.  She denies any specific complaints on the right except for some occasional claudication in the right calf.  She quit smoking in 2017.  Current Outpatient Medications  Medication Sig Dispense Refill  . acetaminophen (TYLENOL) 500 MG tablet Take 500-1,000 mg by mouth 2 (two) times daily.    Marland Kitchen albuterol (PROVENTIL HFA;VENTOLIN HFA) 108 (90 BASE) MCG/ACT inhaler Inhale 2 puffs into the lungs every 6 (six) hours as needed. For bronchitis and coughing    . alendronate (FOSAMAX) 70 MG tablet Take 70 mg by mouth every 7 (seven) days. On Mondays    . amLODipine (NORVASC) 5 MG tablet Take 1 tablet (5 mg total) by mouth daily. 30 tablet 0  . aspirin 81 MG chewable tablet Chew 1 tablet (81 mg total) by mouth daily. 30 tablet 0  . atorvastatin (LIPITOR) 20 MG tablet Take 1 tablet (20 mg total) by mouth at bedtime. (Patient taking differently: Take 20 mg by mouth daily. ) 30 tablet 0  . clopidogrel (PLAVIX) 75 MG tablet Take 75 mg by mouth daily.      Marland Kitchen gabapentin (NEURONTIN) 100 MG capsule Take 100 mg by mouth 3 (three) times daily as needed (general pain).    . insulin glargine (LANTUS) 100 UNIT/ML injection Inject 0.2 mLs (20 Units total) into  the skin at bedtime. (Patient taking differently: Inject 30 Units into the skin See admin instructions. Check blood sugar twice daily. If sugar is close to 200 take 30 units.) 10 mL 11  . Iron-Vitamins (GERITOL COMPLETE PO) Take 1 tablet by mouth daily.     Marland Kitchen labetalol (NORMODYNE) 300 MG tablet Take 1 tablet (300 mg total) by mouth 2 (two) times daily. 60 tablet 0  . LORazepam (ATIVAN) 0.5 MG tablet Take 0.5 mg by mouth at bedtime.     . Omega-3 Fatty Acids (FISH OIL) 1000 MG CPDR Take 1,000 mg by mouth daily.      No current facility-administered medications for this visit.     REVIEW OF SYSTEMS:  [X]  denotes positive finding, [ ]  denotes negative finding Cardiac  Comments:  Chest pain or chest pressure:    Shortness of breath upon exertion:    Short of breath when lying flat:    Irregular heart rhythm:    Constitutional    Fever or chills:     PHYSICAL EXAM:   Vitals:   01/08/18 1540  BP: (!) 145/63  Pulse: 64  Resp: 16  Temp: 97.7 F (36.5 C)  TempSrc: Oral  SpO2: 99%  Weight: 167 lb (75.8 kg)  Height: 5\' 3"  (1.6 m)    GENERAL: The patient is a well-nourished female, in no acute distress. The vital signs are documented above. CARDIOVASCULAR: There  is a regular rate and rhythm. PULMONARY: There is good air exchange bilaterally without wheezing or rales. The left transmetatarsal amputation site is healed. The right foot has no open ulcers. Both feet are warm and adequately perfused.  DATA:   LOWER EXTREMITY ARTERIAL DOPPLER STUDY: I have independently interpreted her lower extremity arterial Doppler study.  On the left side, which is the site of concern, there is a biphasic posterior tibial signal and biphasic dorsalis pedis signal.  ABI is 89%.  On the right side there is a monophasic dorsalis pedis and posterior tibial signal with an ABI of 33%.  MEDICAL ISSUES:   CRITICAL LIMB ISCHEMIA LEFT LOWER EXTREMITY: The patient is doing well status post endovascular  revascularization on the left and now the wound on the transmetatarsal amputation site has healed.  I have encouraged her to stay as active as possible.  Fortunately she is not a smoker.  We have also discussed the importance of nutrition.  She is on Plavix, aspirin, and a statin.  I will see her back in 3 months with a duplex of the left leg and also ABIs.  I have instructed her on the importance of taking good care of her right foot given that she has marginal perfusion on the right.  However she has minimal symptoms and I think the right foot will be fine as long as she does not develop any ulcerations.  I will see her in 3 months.  She knows to call sooner if she has problems.  Deitra Mayo [facility] [phone]

## 2018-01-10 ENCOUNTER — Other Ambulatory Visit: Payer: Self-pay

## 2018-01-10 DIAGNOSIS — I739 Peripheral vascular disease, unspecified: Secondary | ICD-10-CM

## 2018-04-23 ENCOUNTER — Ambulatory Visit: Payer: Medicare HMO | Admitting: Vascular Surgery

## 2018-04-23 ENCOUNTER — Encounter (HOSPITAL_COMMUNITY): Payer: Medicare HMO

## 2018-04-23 ENCOUNTER — Other Ambulatory Visit (HOSPITAL_COMMUNITY): Payer: Medicare HMO

## 2018-05-19 ENCOUNTER — Ambulatory Visit (INDEPENDENT_AMBULATORY_CARE_PROVIDER_SITE_OTHER): Payer: Medicare HMO | Admitting: Otolaryngology

## 2018-05-19 DIAGNOSIS — H66012 Acute suppurative otitis media with spontaneous rupture of ear drum, left ear: Secondary | ICD-10-CM | POA: Diagnosis not present

## 2018-05-19 DIAGNOSIS — H9012 Conductive hearing loss, unilateral, left ear, with unrestricted hearing on the contralateral side: Secondary | ICD-10-CM

## 2018-05-19 DIAGNOSIS — H903 Sensorineural hearing loss, bilateral: Secondary | ICD-10-CM | POA: Diagnosis not present

## 2018-06-02 ENCOUNTER — Ambulatory Visit (INDEPENDENT_AMBULATORY_CARE_PROVIDER_SITE_OTHER): Payer: Medicare HMO | Admitting: Otolaryngology

## 2018-06-02 DIAGNOSIS — H7202 Central perforation of tympanic membrane, left ear: Secondary | ICD-10-CM | POA: Diagnosis not present

## 2018-06-02 DIAGNOSIS — H9072 Mixed conductive and sensorineural hearing loss, unilateral, left ear, with unrestricted hearing on the contralateral side: Secondary | ICD-10-CM

## 2018-06-02 DIAGNOSIS — H903 Sensorineural hearing loss, bilateral: Secondary | ICD-10-CM | POA: Diagnosis not present

## 2018-06-25 ENCOUNTER — Encounter: Payer: Self-pay | Admitting: Vascular Surgery

## 2018-06-25 ENCOUNTER — Ambulatory Visit (HOSPITAL_COMMUNITY): Admission: RE | Admit: 2018-06-25 | Payer: Medicare HMO | Source: Ambulatory Visit

## 2018-06-25 ENCOUNTER — Ambulatory Visit: Payer: Medicare HMO | Admitting: Vascular Surgery

## 2018-10-01 ENCOUNTER — Other Ambulatory Visit (HOSPITAL_COMMUNITY): Payer: Self-pay | Admitting: Family Medicine

## 2018-10-01 DIAGNOSIS — Z1231 Encounter for screening mammogram for malignant neoplasm of breast: Secondary | ICD-10-CM

## 2018-12-04 ENCOUNTER — Ambulatory Visit (INDEPENDENT_AMBULATORY_CARE_PROVIDER_SITE_OTHER): Payer: Medicare HMO | Admitting: Otolaryngology

## 2019-04-22 ENCOUNTER — Other Ambulatory Visit (HOSPITAL_COMMUNITY): Payer: Self-pay | Admitting: Family Medicine

## 2019-04-22 DIAGNOSIS — Z1231 Encounter for screening mammogram for malignant neoplasm of breast: Secondary | ICD-10-CM

## 2019-05-21 IMAGING — US US EXTREM LOW VENOUS*L*
1 series · 13 of 24 positions shown · non-contrast
Comparison: None.

CLINICAL DATA: Recent stenting of the left common femoral artery.
Left leg swelling.



[Series 1: us extrem low venous*left* · 0.08mm/px · 13 of 34 slices shown]
[im 1/34]
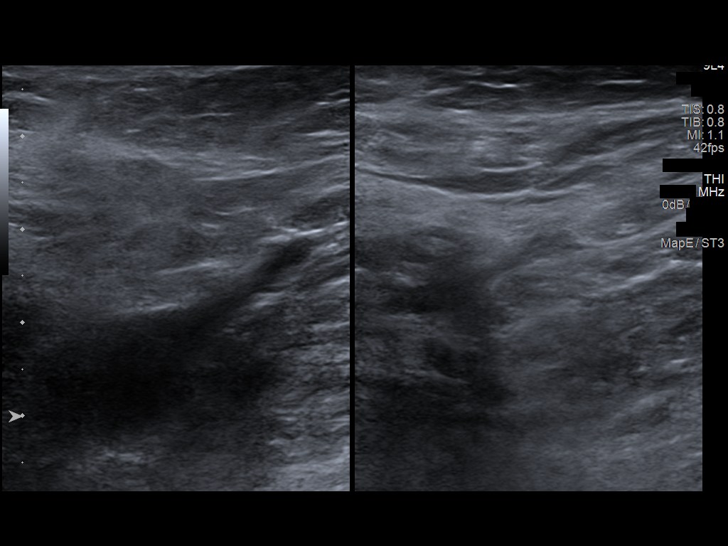
[im 3/34]
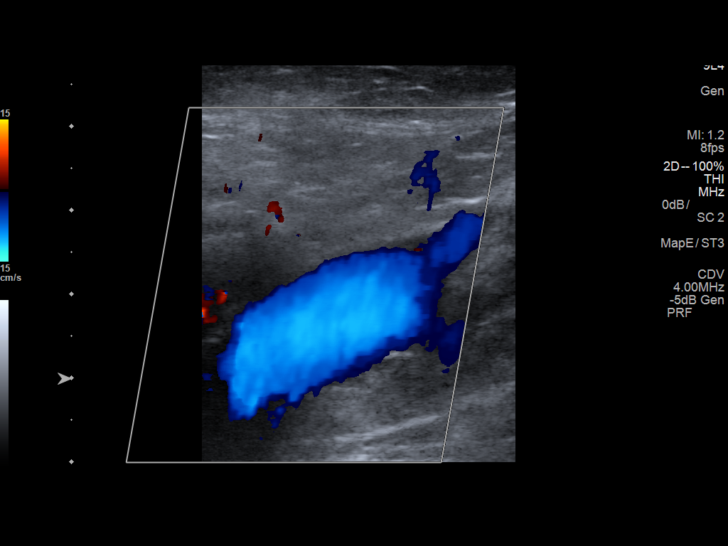
[im 6/34]
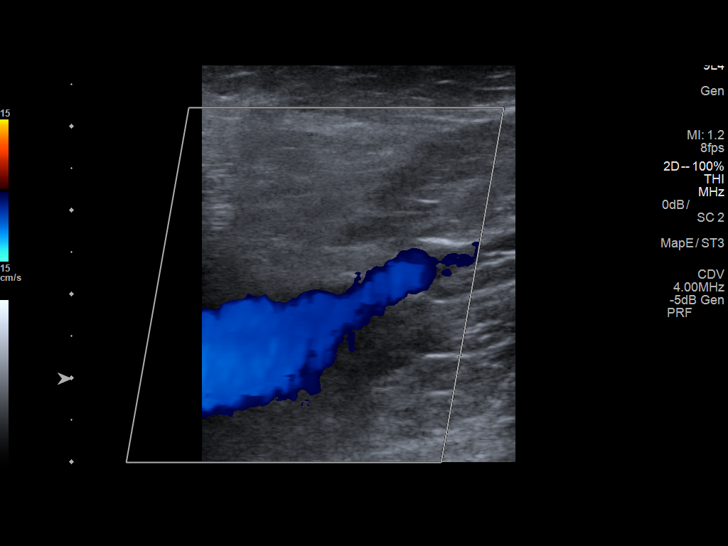
[im 9/34]
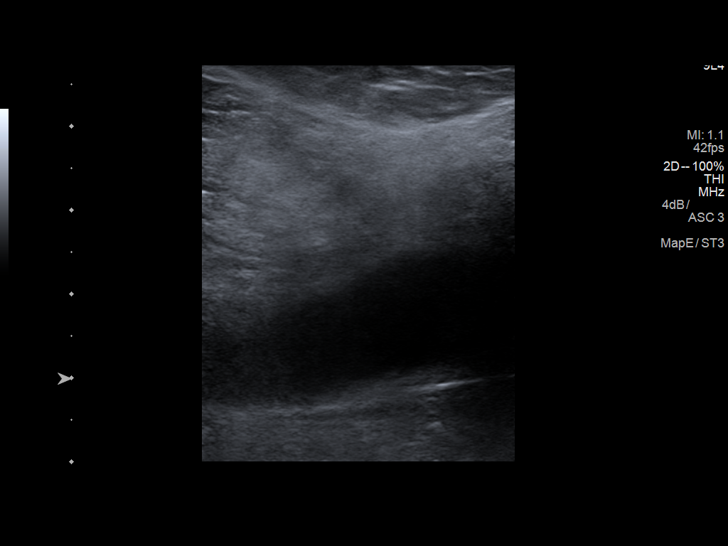
[im 12/34]
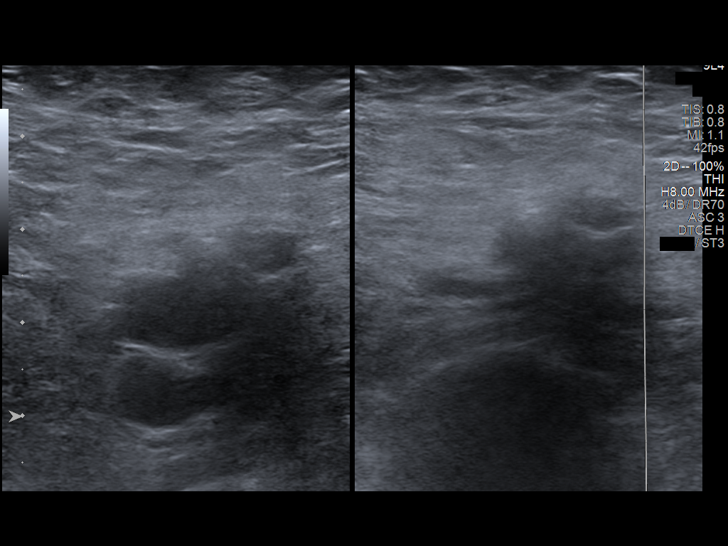
[im 15/34]
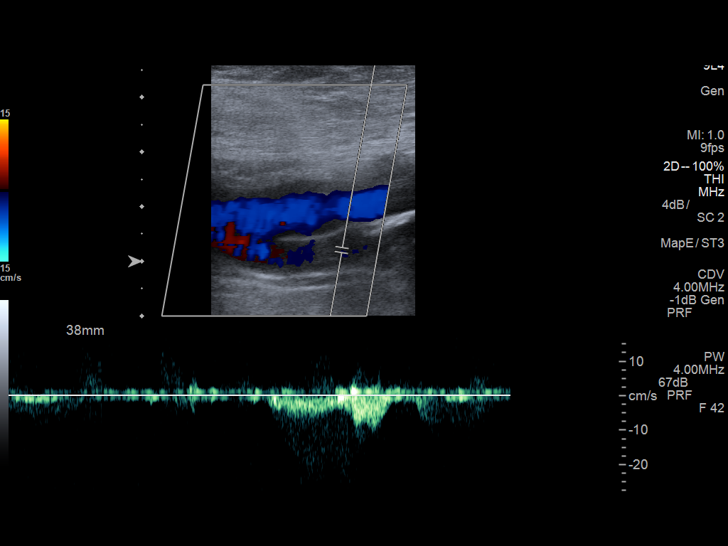
[im 18/34]
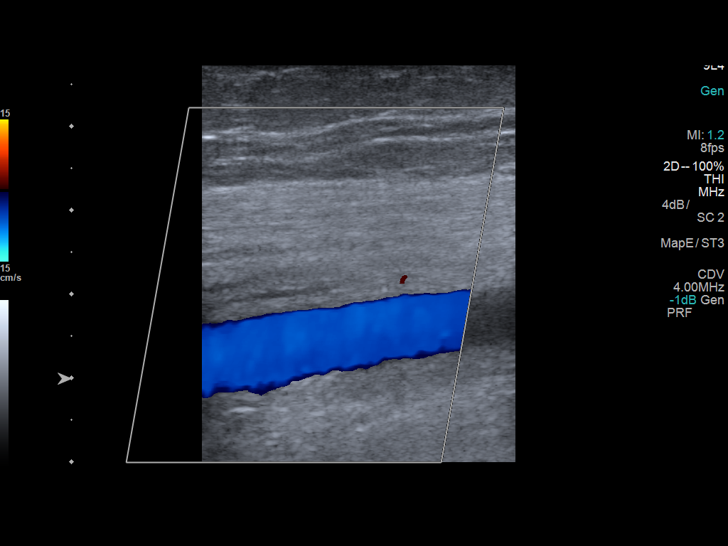
[im 19/34]
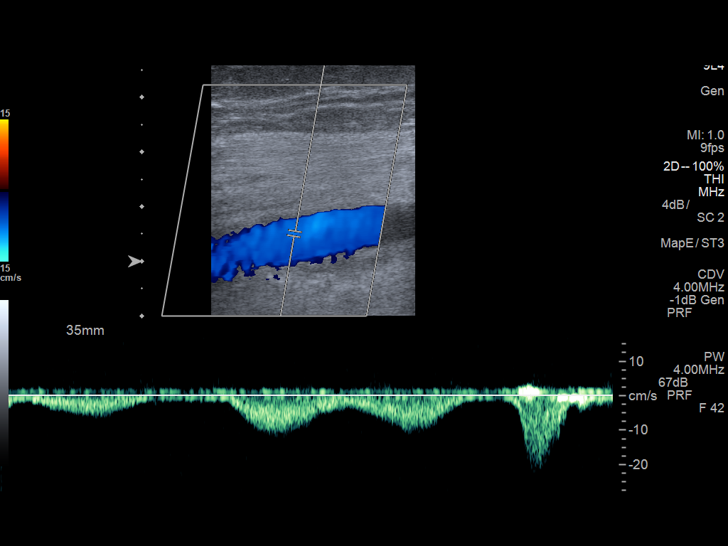
[im 22/34]
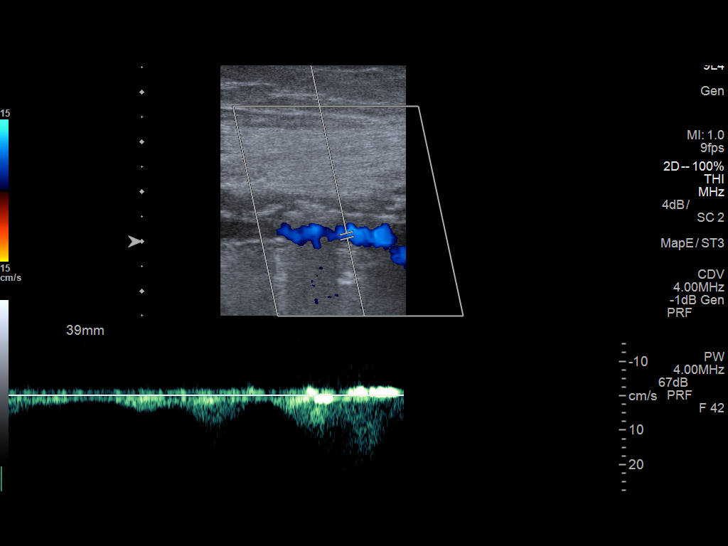
[im 25/34]
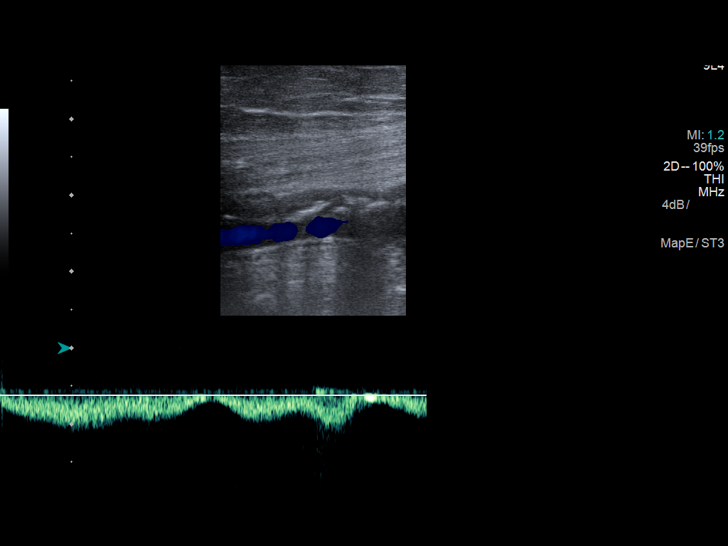
[im 28/34]
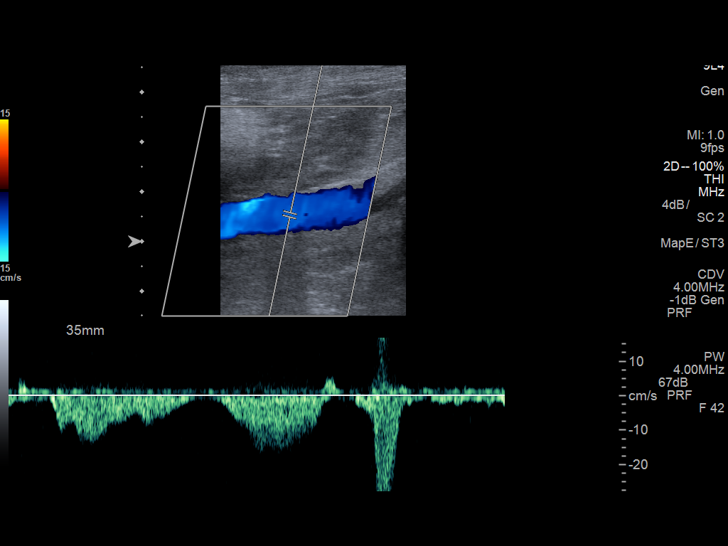
[im 31/34]
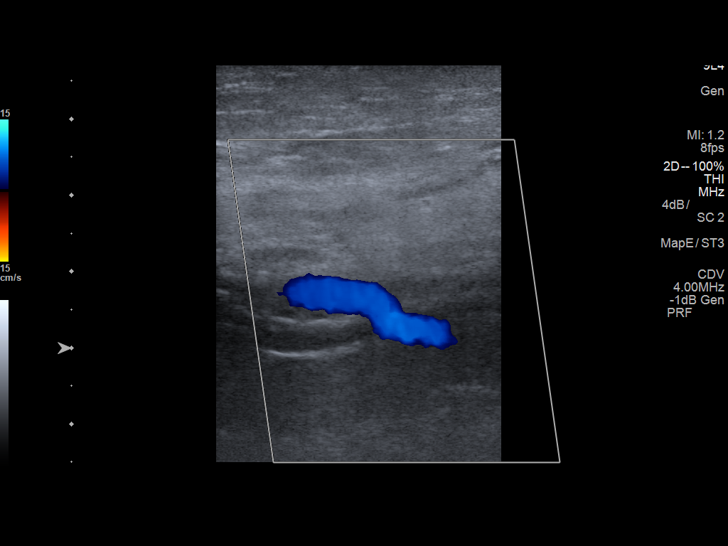
[im 34/34]
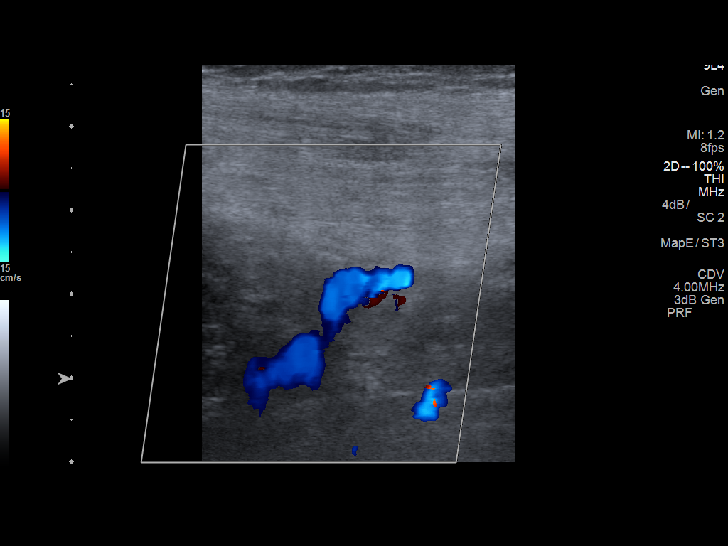

[13 of 24 positions shown; findings below may reference images not displayed]

FINDINGS: Contralateral Common Femoral Vein: Respiratory phasicity is normal
and symmetric with the symptomatic side. No evidence of thrombus.
Normal compressibility.

Common Femoral Vein: No evidence of thrombus. Normal
compressibility, respiratory phasicity and response to augmentation.

Saphenofemoral Junction: No evidence of thrombus. Normal
compressibility and flow on color Doppler imaging.

Profunda Femoral Vein: No evidence of thrombus. Normal
compressibility and flow on color Doppler imaging.

Femoral Vein: No evidence of thrombus. Normal compressibility,
respiratory phasicity and response to augmentation.

Popliteal Vein: No evidence of thrombus. Normal compressibility,
respiratory phasicity and response to augmentation.

Calf Veins: No evidence of thrombus. Normal compressibility and flow
on color Doppler imaging.

Superficial Great Saphenous Vein: No evidence of thrombus. Normal
compressibility and flow on color Doppler imaging.

Venous Reflux:  None.

Other Findings:  None.
IMPRESSION: No evidence of DVT within the left lower extremity.

## 2019-06-23 DIAGNOSIS — E1129 Type 2 diabetes mellitus with other diabetic kidney complication: Secondary | ICD-10-CM | POA: Diagnosis not present

## 2019-06-23 DIAGNOSIS — Z6826 Body mass index (BMI) 26.0-26.9, adult: Secondary | ICD-10-CM | POA: Diagnosis not present

## 2019-06-23 DIAGNOSIS — E663 Overweight: Secondary | ICD-10-CM | POA: Diagnosis not present

## 2019-06-23 DIAGNOSIS — B351 Tinea unguium: Secondary | ICD-10-CM | POA: Diagnosis not present

## 2019-06-23 DIAGNOSIS — F419 Anxiety disorder, unspecified: Secondary | ICD-10-CM | POA: Diagnosis not present

## 2019-06-23 DIAGNOSIS — E1140 Type 2 diabetes mellitus with diabetic neuropathy, unspecified: Secondary | ICD-10-CM | POA: Diagnosis not present

## 2019-07-01 DIAGNOSIS — Z6826 Body mass index (BMI) 26.0-26.9, adult: Secondary | ICD-10-CM | POA: Diagnosis not present

## 2019-07-01 DIAGNOSIS — R443 Hallucinations, unspecified: Secondary | ICD-10-CM | POA: Diagnosis not present

## 2019-07-01 DIAGNOSIS — R410 Disorientation, unspecified: Secondary | ICD-10-CM | POA: Diagnosis not present

## 2019-07-01 DIAGNOSIS — E663 Overweight: Secondary | ICD-10-CM | POA: Diagnosis not present

## 2019-07-10 DIAGNOSIS — F419 Anxiety disorder, unspecified: Secondary | ICD-10-CM | POA: Diagnosis not present

## 2019-07-10 DIAGNOSIS — R410 Disorientation, unspecified: Secondary | ICD-10-CM | POA: Diagnosis not present

## 2019-07-10 DIAGNOSIS — I5032 Chronic diastolic (congestive) heart failure: Secondary | ICD-10-CM | POA: Diagnosis not present

## 2019-07-10 DIAGNOSIS — R443 Hallucinations, unspecified: Secondary | ICD-10-CM | POA: Diagnosis not present

## 2019-07-10 DIAGNOSIS — Z89432 Acquired absence of left foot: Secondary | ICD-10-CM | POA: Diagnosis not present

## 2019-07-10 DIAGNOSIS — I739 Peripheral vascular disease, unspecified: Secondary | ICD-10-CM | POA: Diagnosis not present

## 2019-07-10 DIAGNOSIS — J449 Chronic obstructive pulmonary disease, unspecified: Secondary | ICD-10-CM | POA: Diagnosis not present

## 2019-07-10 DIAGNOSIS — E1165 Type 2 diabetes mellitus with hyperglycemia: Secondary | ICD-10-CM | POA: Diagnosis not present

## 2019-07-15 DIAGNOSIS — E7800 Pure hypercholesterolemia, unspecified: Secondary | ICD-10-CM | POA: Diagnosis not present

## 2019-07-15 DIAGNOSIS — F449 Dissociative and conversion disorder, unspecified: Secondary | ICD-10-CM | POA: Diagnosis not present

## 2019-07-15 DIAGNOSIS — E119 Type 2 diabetes mellitus without complications: Secondary | ICD-10-CM | POA: Diagnosis not present

## 2019-07-15 DIAGNOSIS — I10 Essential (primary) hypertension: Secondary | ICD-10-CM | POA: Diagnosis not present

## 2019-07-17 ENCOUNTER — Ambulatory Visit: Payer: Medicare HMO | Admitting: Podiatry

## 2019-07-17 ENCOUNTER — Other Ambulatory Visit: Payer: Self-pay

## 2019-07-17 DIAGNOSIS — B351 Tinea unguium: Secondary | ICD-10-CM | POA: Diagnosis not present

## 2019-07-17 DIAGNOSIS — E1169 Type 2 diabetes mellitus with other specified complication: Secondary | ICD-10-CM

## 2019-07-17 DIAGNOSIS — Z89619 Acquired absence of unspecified leg above knee: Secondary | ICD-10-CM

## 2019-07-17 DIAGNOSIS — Q666 Other congenital valgus deformities of feet: Secondary | ICD-10-CM | POA: Diagnosis not present

## 2019-07-17 NOTE — Progress Notes (Signed)
Subjective:  Patient ID: Sherri Brooks, female    DOB: 1946-07-10,  MRN: [id number]  Chief Complaint  Patient presents with  . Nail Problem    Right 1st nail is painful    73 y.o. female presents  for diabetic foot care. Last AIC 6.2 .  States that the nails especially the right third toenail is painful and thickened. Denies numbness and tingling in their feet. Denies cramping in legs and thighs.  Review of Systems: Negative except as noted in the HPI. Denies N/V/F/Ch.  Past Medical History:  Diagnosis Date  . Arthritis   . CAD (coronary artery disease)   . Cellulitis of buttock, left 2010  . Diabetes mellitus   . GERD (gastroesophageal reflux disease)   . Hyperlipidemia   . Hypertension   . MI (myocardial infarction) ([place])   . PVD (peripheral vascular disease) ([place])   . Wears glasses     Current Outpatient Medications:  .  acetaminophen (TYLENOL) 500 MG tablet, Take 500-1,000 mg by mouth 2 (two) times daily., Disp: , Rfl:  .  albuterol (PROVENTIL HFA;VENTOLIN HFA) 108 (90 BASE) MCG/ACT inhaler, Inhale 2 puffs into the lungs every 6 (six) hours as needed. For bronchitis and coughing, Disp: , Rfl:  .  alendronate (FOSAMAX) 70 MG tablet, Take 70 mg by mouth every 7 (seven) days. On Mondays, Disp: , Rfl:  .  amLODipine (NORVASC) 5 MG tablet, Take 1 tablet (5 mg total) by mouth daily., Disp: 30 tablet, Rfl: 0 .  aspirin 81 MG chewable tablet, Chew 1 tablet (81 mg total) by mouth daily., Disp: 30 tablet, Rfl: 0 .  atorvastatin (LIPITOR) 20 MG tablet, Take 1 tablet (20 mg total) by mouth at bedtime. (Patient taking differently: Take 20 mg by mouth daily. ), Disp: 30 tablet, Rfl: 0 .  clopidogrel (PLAVIX) 75 MG tablet, Take 75 mg by mouth daily.  , Disp: , Rfl:  .  gabapentin (NEURONTIN) 100 MG capsule, Take 100 mg by mouth 3 (three) times daily as needed (general pain)., Disp: , Rfl:  .  insulin glargine (LANTUS) 100 UNIT/ML injection, Inject 0.2 mLs (20 Units total) into the skin  at bedtime. (Patient taking differently: Inject 30 Units into the skin See admin instructions. Check blood sugar twice daily. If sugar is close to 200 take 30 units.), Disp: 10 mL, Rfl: 11 .  Iron-Vitamins (GERITOL COMPLETE PO), Take 1 tablet by mouth daily. , Disp: , Rfl:  .  labetalol (NORMODYNE) 300 MG tablet, Take 1 tablet (300 mg total) by mouth 2 (two) times daily., Disp: 60 tablet, Rfl: 0 .  LORazepam (ATIVAN) 0.5 MG tablet, Take 0.5 mg by mouth at bedtime. , Disp: , Rfl:  .  Omega-3 Fatty Acids (FISH OIL) 1000 MG CPDR, Take 1,000 mg by mouth daily. , Disp: , Rfl:   Social History   Tobacco Use  Smoking Status Former Smoker  . Packs/day: 0.40  . Years: 20.00  . Pack years: 8.00  . Types: Cigarettes  . Start date: 11/04/2012  . Quit date: 03/11/2016  . Years since quitting: 3.4  Smokeless Tobacco Never Used    Allergies  Allergen Reactions  . Iohexol Hives     Code: HIVES, Desc: PT STATES SHE EXPERIENCED ITCHING W/ IV DYE IN PAST-ARS 01/21/10, Onset Date: [id number]    Objective:  There were no vitals filed for this visit. There is no height or weight on file to calculate BMI. Constitutional Well developed. Well nourished.  Vascular Dorsalis pedis  pulses present 1+ bilaterally  Posterior tibial pulses 1+ bilaterally  Pedal hair growth diminished. Capillary refill normal to all digits.  No cyanosis or clubbing noted.  Neurologic Normal speech. Oriented to person, place, and time. Epicritic sensation to light touch grossly present bilaterally. Protective sensation with 5.07 monofilament  present bilaterally. Vibratory sensation present bilaterally.  Dermatologic Nails elongated, thickened, dystrophic. No open wounds. No skin lesions.  Orthopedic: Normal joint ROM without pain or crepitus bilaterally. No visible deformities. No bony tenderness.  History of midfoot amputation right Hindfoot valgus left   Assessment:   1. History of amputation of lower extremity  associated with diabetes mellitus ([place])   2. Onychomycosis   3. Congenital hindfoot valgus    Plan:  Patient was evaluated and treated and all questions answered.  Diabetes with amputation history, Onychomycosis -Educated on diabetic footcare. Diabetic risk level 3 -Nails x10 debrided sharply and manually with large nail nipper and rotary burr.   Procedure: Nail Debridement Rationale: Patient meets criteria for routine foot care due to PAD Type of Debridement: manual, sharp debridement. Instrumentation: Nail nipper, rotary burr. Number of Nails: 5  History of missed amputation left -Would benefit from toe filler -Would benefit from ASO component to the left foot given hindfoot deformity  Return in about 3 months (around 10/17/2019) for DM footcare .

## 2019-07-24 ENCOUNTER — Ambulatory Visit: Payer: Medicare HMO | Admitting: Orthotics

## 2019-07-24 ENCOUNTER — Other Ambulatory Visit: Payer: Self-pay

## 2019-07-24 DIAGNOSIS — S82843P Displaced bimalleolar fracture of unspecified lower leg, subsequent encounter for closed fracture with malunion: Secondary | ICD-10-CM

## 2019-07-24 DIAGNOSIS — Z89619 Acquired absence of unspecified leg above knee: Secondary | ICD-10-CM

## 2019-07-24 DIAGNOSIS — Q666 Other congenital valgus deformities of feet: Secondary | ICD-10-CM

## 2019-07-24 DIAGNOSIS — E1169 Type 2 diabetes mellitus with other specified complication: Secondary | ICD-10-CM

## 2019-08-11 DIAGNOSIS — R0902 Hypoxemia: Secondary | ICD-10-CM | POA: Diagnosis not present

## 2019-08-11 DIAGNOSIS — E11649 Type 2 diabetes mellitus with hypoglycemia without coma: Secondary | ICD-10-CM | POA: Diagnosis not present

## 2019-08-11 DIAGNOSIS — R569 Unspecified convulsions: Secondary | ICD-10-CM | POA: Diagnosis not present

## 2019-08-11 DIAGNOSIS — I110 Hypertensive heart disease with heart failure: Secondary | ICD-10-CM | POA: Diagnosis not present

## 2019-08-11 DIAGNOSIS — E162 Hypoglycemia, unspecified: Secondary | ICD-10-CM | POA: Diagnosis not present

## 2019-08-11 DIAGNOSIS — Z794 Long term (current) use of insulin: Secondary | ICD-10-CM | POA: Diagnosis not present

## 2019-08-11 DIAGNOSIS — R404 Transient alteration of awareness: Secondary | ICD-10-CM | POA: Diagnosis not present

## 2019-08-11 DIAGNOSIS — E161 Other hypoglycemia: Secondary | ICD-10-CM | POA: Diagnosis not present

## 2019-08-11 DIAGNOSIS — R4182 Altered mental status, unspecified: Secondary | ICD-10-CM | POA: Diagnosis not present

## 2019-08-11 DIAGNOSIS — F17210 Nicotine dependence, cigarettes, uncomplicated: Secondary | ICD-10-CM | POA: Diagnosis not present

## 2019-08-11 DIAGNOSIS — E876 Hypokalemia: Secondary | ICD-10-CM | POA: Diagnosis not present

## 2019-08-11 DIAGNOSIS — I509 Heart failure, unspecified: Secondary | ICD-10-CM | POA: Diagnosis not present

## 2019-08-11 DIAGNOSIS — E119 Type 2 diabetes mellitus without complications: Secondary | ICD-10-CM | POA: Diagnosis not present

## 2019-08-12 ENCOUNTER — Telehealth: Payer: Self-pay | Admitting: Podiatry

## 2019-08-12 NOTE — Telephone Encounter (Signed)
pts daughter Levada Dy called and left message checking status of diabetic shoes for pt.  I returned call and left message that shoes are not back we are waiting to get prior auth for the possible brace and that I would call pt when shoes/brace come in.

## 2019-08-15 DIAGNOSIS — E1122 Type 2 diabetes mellitus with diabetic chronic kidney disease: Secondary | ICD-10-CM | POA: Diagnosis not present

## 2019-08-15 DIAGNOSIS — E1140 Type 2 diabetes mellitus with diabetic neuropathy, unspecified: Secondary | ICD-10-CM | POA: Diagnosis not present

## 2019-08-15 DIAGNOSIS — I130 Hypertensive heart and chronic kidney disease with heart failure and stage 1 through stage 4 chronic kidney disease, or unspecified chronic kidney disease: Secondary | ICD-10-CM | POA: Diagnosis not present

## 2019-08-15 DIAGNOSIS — I5032 Chronic diastolic (congestive) heart failure: Secondary | ICD-10-CM | POA: Diagnosis not present

## 2019-08-31 DIAGNOSIS — E663 Overweight: Secondary | ICD-10-CM | POA: Diagnosis not present

## 2019-08-31 DIAGNOSIS — E1140 Type 2 diabetes mellitus with diabetic neuropathy, unspecified: Secondary | ICD-10-CM | POA: Diagnosis not present

## 2019-08-31 DIAGNOSIS — Z6827 Body mass index (BMI) 27.0-27.9, adult: Secondary | ICD-10-CM | POA: Diagnosis not present

## 2019-08-31 DIAGNOSIS — Z89431 Acquired absence of right foot: Secondary | ICD-10-CM | POA: Diagnosis not present

## 2019-08-31 NOTE — Progress Notes (Signed)
Patient was seen today for Toe Filler left, along with an [place] AFO left to offer rear foot stability, longitudinal arch support and forefoot cushion.   This brace will support ambulation in sagital and frontal plaes. Please refer to Dr. March Rummage chart notes 10/2 for further claarification.

## 2019-09-14 DIAGNOSIS — E1122 Type 2 diabetes mellitus with diabetic chronic kidney disease: Secondary | ICD-10-CM | POA: Diagnosis not present

## 2019-09-14 DIAGNOSIS — I5032 Chronic diastolic (congestive) heart failure: Secondary | ICD-10-CM | POA: Diagnosis not present

## 2019-09-14 DIAGNOSIS — J449 Chronic obstructive pulmonary disease, unspecified: Secondary | ICD-10-CM | POA: Diagnosis not present

## 2019-09-14 DIAGNOSIS — I130 Hypertensive heart and chronic kidney disease with heart failure and stage 1 through stage 4 chronic kidney disease, or unspecified chronic kidney disease: Secondary | ICD-10-CM | POA: Diagnosis not present

## 2019-10-06 ENCOUNTER — Other Ambulatory Visit: Payer: Self-pay

## 2019-10-06 ENCOUNTER — Ambulatory Visit (INDEPENDENT_AMBULATORY_CARE_PROVIDER_SITE_OTHER): Payer: Medicare HMO | Admitting: Orthotics

## 2019-10-06 DIAGNOSIS — Z89619 Acquired absence of unspecified leg above knee: Secondary | ICD-10-CM

## 2019-10-06 DIAGNOSIS — Q666 Other congenital valgus deformities of feet: Secondary | ICD-10-CM

## 2019-10-06 DIAGNOSIS — E1169 Type 2 diabetes mellitus with other specified complication: Secondary | ICD-10-CM | POA: Diagnosis not present

## 2019-10-06 DIAGNOSIS — S82843P Displaced bimalleolar fracture of unspecified lower leg, subsequent encounter for closed fracture with malunion: Secondary | ICD-10-CM

## 2019-10-06 NOTE — Progress Notes (Signed)

## 2019-10-15 DIAGNOSIS — Z794 Long term (current) use of insulin: Secondary | ICD-10-CM | POA: Diagnosis not present

## 2019-10-15 DIAGNOSIS — I5032 Chronic diastolic (congestive) heart failure: Secondary | ICD-10-CM | POA: Diagnosis not present

## 2019-10-15 DIAGNOSIS — I130 Hypertensive heart and chronic kidney disease with heart failure and stage 1 through stage 4 chronic kidney disease, or unspecified chronic kidney disease: Secondary | ICD-10-CM | POA: Diagnosis not present

## 2019-10-15 DIAGNOSIS — E1122 Type 2 diabetes mellitus with diabetic chronic kidney disease: Secondary | ICD-10-CM | POA: Diagnosis not present

## 2019-10-23 ENCOUNTER — Ambulatory Visit: Payer: Medicare HMO | Admitting: Podiatry

## 2019-11-03 ENCOUNTER — Ambulatory Visit (INDEPENDENT_AMBULATORY_CARE_PROVIDER_SITE_OTHER): Payer: Medicare HMO | Admitting: Orthotics

## 2019-11-03 ENCOUNTER — Other Ambulatory Visit: Payer: Self-pay

## 2019-11-03 DIAGNOSIS — E1169 Type 2 diabetes mellitus with other specified complication: Secondary | ICD-10-CM | POA: Diagnosis not present

## 2019-11-03 DIAGNOSIS — Z89619 Acquired absence of unspecified leg above knee: Secondary | ICD-10-CM

## 2019-11-03 DIAGNOSIS — S82843P Displaced bimalleolar fracture of unspecified lower leg, subsequent encounter for closed fracture with malunion: Secondary | ICD-10-CM

## 2019-11-03 DIAGNOSIS — Q666 Other congenital valgus deformities of feet: Secondary | ICD-10-CM

## 2019-11-03 NOTE — Progress Notes (Signed)
Patient picked up partial foot transmet [place] AFO.  Patient advised to watchfor any swelling/irration among distal aspect of residual foot/stump.   She seemed well pleased with fit and function.

## 2019-11-04 DIAGNOSIS — Z6827 Body mass index (BMI) 27.0-27.9, adult: Secondary | ICD-10-CM | POA: Diagnosis not present

## 2019-11-04 DIAGNOSIS — F419 Anxiety disorder, unspecified: Secondary | ICD-10-CM | POA: Diagnosis not present

## 2019-11-04 DIAGNOSIS — Z0001 Encounter for general adult medical examination with abnormal findings: Secondary | ICD-10-CM | POA: Diagnosis not present

## 2019-11-04 DIAGNOSIS — I10 Essential (primary) hypertension: Secondary | ICD-10-CM | POA: Diagnosis not present

## 2019-11-04 DIAGNOSIS — Z1389 Encounter for screening for other disorder: Secondary | ICD-10-CM | POA: Diagnosis not present

## 2019-11-04 DIAGNOSIS — H6692 Otitis media, unspecified, left ear: Secondary | ICD-10-CM | POA: Diagnosis not present

## 2019-11-04 DIAGNOSIS — E1129 Type 2 diabetes mellitus with other diabetic kidney complication: Secondary | ICD-10-CM | POA: Diagnosis not present

## 2019-11-04 DIAGNOSIS — E663 Overweight: Secondary | ICD-10-CM | POA: Diagnosis not present

## 2019-11-04 DIAGNOSIS — H6092 Unspecified otitis externa, left ear: Secondary | ICD-10-CM | POA: Diagnosis not present

## 2019-11-04 DIAGNOSIS — Z0000 Encounter for general adult medical examination without abnormal findings: Secondary | ICD-10-CM | POA: Diagnosis not present

## 2019-11-15 DIAGNOSIS — I130 Hypertensive heart and chronic kidney disease with heart failure and stage 1 through stage 4 chronic kidney disease, or unspecified chronic kidney disease: Secondary | ICD-10-CM | POA: Diagnosis not present

## 2019-11-15 DIAGNOSIS — J449 Chronic obstructive pulmonary disease, unspecified: Secondary | ICD-10-CM | POA: Diagnosis not present

## 2019-11-15 DIAGNOSIS — I5032 Chronic diastolic (congestive) heart failure: Secondary | ICD-10-CM | POA: Diagnosis not present

## 2019-11-15 DIAGNOSIS — E1122 Type 2 diabetes mellitus with diabetic chronic kidney disease: Secondary | ICD-10-CM | POA: Diagnosis not present

## 2019-11-15 DIAGNOSIS — Z720 Tobacco use: Secondary | ICD-10-CM | POA: Diagnosis not present

## 2019-11-15 DIAGNOSIS — N183 Chronic kidney disease, stage 3 unspecified: Secondary | ICD-10-CM | POA: Diagnosis not present

## 2019-12-13 DIAGNOSIS — I130 Hypertensive heart and chronic kidney disease with heart failure and stage 1 through stage 4 chronic kidney disease, or unspecified chronic kidney disease: Secondary | ICD-10-CM | POA: Diagnosis not present

## 2019-12-13 DIAGNOSIS — E1122 Type 2 diabetes mellitus with diabetic chronic kidney disease: Secondary | ICD-10-CM | POA: Diagnosis not present

## 2019-12-13 DIAGNOSIS — J449 Chronic obstructive pulmonary disease, unspecified: Secondary | ICD-10-CM | POA: Diagnosis not present

## 2019-12-13 DIAGNOSIS — I5032 Chronic diastolic (congestive) heart failure: Secondary | ICD-10-CM | POA: Diagnosis not present

## 2019-12-29 DIAGNOSIS — S0990XA Unspecified injury of head, initial encounter: Secondary | ICD-10-CM | POA: Diagnosis not present

## 2019-12-29 DIAGNOSIS — E11649 Type 2 diabetes mellitus with hypoglycemia without coma: Secondary | ICD-10-CM | POA: Diagnosis not present

## 2019-12-29 DIAGNOSIS — E119 Type 2 diabetes mellitus without complications: Secondary | ICD-10-CM | POA: Diagnosis not present

## 2019-12-29 DIAGNOSIS — I509 Heart failure, unspecified: Secondary | ICD-10-CM | POA: Diagnosis not present

## 2019-12-29 DIAGNOSIS — S0502XA Injury of conjunctiva and corneal abrasion without foreign body, left eye, initial encounter: Secondary | ICD-10-CM | POA: Diagnosis not present

## 2019-12-29 DIAGNOSIS — H5712 Ocular pain, left eye: Secondary | ICD-10-CM | POA: Diagnosis not present

## 2019-12-29 DIAGNOSIS — S0542XA Penetrating wound of orbit with or without foreign body, left eye, initial encounter: Secondary | ICD-10-CM | POA: Diagnosis not present

## 2019-12-29 DIAGNOSIS — W1830XA Fall on same level, unspecified, initial encounter: Secondary | ICD-10-CM | POA: Diagnosis not present

## 2019-12-29 DIAGNOSIS — Z794 Long term (current) use of insulin: Secondary | ICD-10-CM | POA: Diagnosis not present

## 2019-12-29 DIAGNOSIS — I110 Hypertensive heart disease with heart failure: Secondary | ICD-10-CM | POA: Diagnosis not present

## 2020-01-08 ENCOUNTER — Ambulatory Visit: Payer: Medicare HMO | Admitting: Orthotics

## 2020-01-13 DIAGNOSIS — I5032 Chronic diastolic (congestive) heart failure: Secondary | ICD-10-CM | POA: Diagnosis not present

## 2020-01-13 DIAGNOSIS — N183 Chronic kidney disease, stage 3 unspecified: Secondary | ICD-10-CM | POA: Diagnosis not present

## 2020-01-13 DIAGNOSIS — I130 Hypertensive heart and chronic kidney disease with heart failure and stage 1 through stage 4 chronic kidney disease, or unspecified chronic kidney disease: Secondary | ICD-10-CM | POA: Diagnosis not present

## 2020-01-13 DIAGNOSIS — E1122 Type 2 diabetes mellitus with diabetic chronic kidney disease: Secondary | ICD-10-CM | POA: Diagnosis not present

## 2020-01-21 ENCOUNTER — Inpatient Hospital Stay (HOSPITAL_COMMUNITY)
Admission: EM | Admit: 2020-01-21 | Discharge: 2020-01-23 | DRG: 689 | Disposition: A | Discharge status: Home Health | Payer: Medicare HMO | Attending: Internal Medicine | Admitting: Internal Medicine

## 2020-01-21 ENCOUNTER — Emergency Department (HOSPITAL_COMMUNITY): Payer: Medicare HMO

## 2020-01-21 ENCOUNTER — Encounter (HOSPITAL_COMMUNITY): Payer: Self-pay | Admitting: *Deleted

## 2020-01-21 ENCOUNTER — Other Ambulatory Visit: Payer: Self-pay

## 2020-01-21 DIAGNOSIS — E118 Type 2 diabetes mellitus with unspecified complications: Secondary | ICD-10-CM | POA: Diagnosis present

## 2020-01-21 DIAGNOSIS — J439 Emphysema, unspecified: Secondary | ICD-10-CM | POA: Diagnosis not present

## 2020-01-21 DIAGNOSIS — Z955 Presence of coronary angioplasty implant and graft: Secondary | ICD-10-CM

## 2020-01-21 DIAGNOSIS — Z87891 Personal history of nicotine dependence: Secondary | ICD-10-CM | POA: Diagnosis not present

## 2020-01-21 DIAGNOSIS — G9341 Metabolic encephalopathy: Secondary | ICD-10-CM | POA: Diagnosis present

## 2020-01-21 DIAGNOSIS — I2510 Atherosclerotic heart disease of native coronary artery without angina pectoris: Secondary | ICD-10-CM | POA: Diagnosis not present

## 2020-01-21 DIAGNOSIS — I10 Essential (primary) hypertension: Secondary | ICD-10-CM | POA: Diagnosis present

## 2020-01-21 DIAGNOSIS — Z7983 Long term (current) use of bisphosphonates: Secondary | ICD-10-CM

## 2020-01-21 DIAGNOSIS — Z820 Family history of epilepsy and other diseases of the nervous system: Secondary | ICD-10-CM | POA: Diagnosis not present

## 2020-01-21 DIAGNOSIS — Z794 Long term (current) use of insulin: Secondary | ICD-10-CM

## 2020-01-21 DIAGNOSIS — Z20822 Contact with and (suspected) exposure to covid-19: Secondary | ICD-10-CM | POA: Diagnosis present

## 2020-01-21 DIAGNOSIS — Z89432 Acquired absence of left foot: Secondary | ICD-10-CM

## 2020-01-21 DIAGNOSIS — I129 Hypertensive chronic kidney disease with stage 1 through stage 4 chronic kidney disease, or unspecified chronic kidney disease: Secondary | ICD-10-CM | POA: Diagnosis present

## 2020-01-21 DIAGNOSIS — E785 Hyperlipidemia, unspecified: Secondary | ICD-10-CM | POA: Diagnosis present

## 2020-01-21 DIAGNOSIS — E1122 Type 2 diabetes mellitus with diabetic chronic kidney disease: Secondary | ICD-10-CM | POA: Diagnosis not present

## 2020-01-21 DIAGNOSIS — M1990 Unspecified osteoarthritis, unspecified site: Secondary | ICD-10-CM | POA: Diagnosis not present

## 2020-01-21 DIAGNOSIS — Z808 Family history of malignant neoplasm of other organs or systems: Secondary | ICD-10-CM

## 2020-01-21 DIAGNOSIS — Z7982 Long term (current) use of aspirin: Secondary | ICD-10-CM

## 2020-01-21 DIAGNOSIS — E1151 Type 2 diabetes mellitus with diabetic peripheral angiopathy without gangrene: Secondary | ICD-10-CM | POA: Diagnosis present

## 2020-01-21 DIAGNOSIS — N1832 Chronic kidney disease, stage 3b: Secondary | ICD-10-CM | POA: Diagnosis present

## 2020-01-21 DIAGNOSIS — R918 Other nonspecific abnormal finding of lung field: Secondary | ICD-10-CM | POA: Diagnosis not present

## 2020-01-21 DIAGNOSIS — J984 Other disorders of lung: Secondary | ICD-10-CM

## 2020-01-21 DIAGNOSIS — K219 Gastro-esophageal reflux disease without esophagitis: Secondary | ICD-10-CM | POA: Diagnosis present

## 2020-01-21 DIAGNOSIS — Z91041 Radiographic dye allergy status: Secondary | ICD-10-CM

## 2020-01-21 DIAGNOSIS — Z7902 Long term (current) use of antithrombotics/antiplatelets: Secondary | ICD-10-CM | POA: Diagnosis not present

## 2020-01-21 DIAGNOSIS — R413 Other amnesia: Secondary | ICD-10-CM | POA: Diagnosis not present

## 2020-01-21 DIAGNOSIS — R4781 Slurred speech: Secondary | ICD-10-CM | POA: Diagnosis present

## 2020-01-21 DIAGNOSIS — E119 Type 2 diabetes mellitus without complications: Secondary | ICD-10-CM | POA: Diagnosis present

## 2020-01-21 DIAGNOSIS — Z833 Family history of diabetes mellitus: Secondary | ICD-10-CM | POA: Diagnosis not present

## 2020-01-21 DIAGNOSIS — R911 Solitary pulmonary nodule: Secondary | ICD-10-CM | POA: Diagnosis present

## 2020-01-21 DIAGNOSIS — I252 Old myocardial infarction: Secondary | ICD-10-CM

## 2020-01-21 DIAGNOSIS — Z885 Allergy status to narcotic agent status: Secondary | ICD-10-CM

## 2020-01-21 DIAGNOSIS — E860 Dehydration: Secondary | ICD-10-CM | POA: Diagnosis present

## 2020-01-21 DIAGNOSIS — Z888 Allergy status to other drugs, medicaments and biological substances status: Secondary | ICD-10-CM

## 2020-01-21 DIAGNOSIS — R270 Ataxia, unspecified: Secondary | ICD-10-CM | POA: Diagnosis not present

## 2020-01-21 DIAGNOSIS — R4182 Altered mental status, unspecified: Secondary | ICD-10-CM | POA: Diagnosis not present

## 2020-01-21 DIAGNOSIS — N390 Urinary tract infection, site not specified: Secondary | ICD-10-CM | POA: Diagnosis not present

## 2020-01-21 DIAGNOSIS — N183 Chronic kidney disease, stage 3 unspecified: Secondary | ICD-10-CM

## 2020-01-21 LAB — CBC WITH DIFFERENTIAL/PLATELET
Abs Immature Granulocytes: 0.03 10*3/uL (ref 0.00–0.07)
Basophils Absolute: 0.1 10*3/uL (ref 0.0–0.1)
Basophils Relative: 1 %
Eosinophils Absolute: 0.2 10*3/uL (ref 0.0–0.5)
Eosinophils Relative: 3 %
HCT: 29.6 % — ABNORMAL LOW (ref 36.0–46.0)
Hemoglobin: 9.2 g/dL — ABNORMAL LOW (ref 12.0–15.0)
Immature Granulocytes: 0 %
Lymphocytes Relative: 31 %
Lymphs Abs: 2.2 10*3/uL (ref 0.7–4.0)
MCH: 26.5 pg (ref 26.0–34.0)
MCHC: 31.1 g/dL (ref 30.0–36.0)
MCV: 85.3 fL (ref 80.0–100.0)
Monocytes Absolute: 0.6 10*3/uL (ref 0.1–1.0)
Monocytes Relative: 8 %
Neutro Abs: 4.2 10*3/uL (ref 1.7–7.7)
Neutrophils Relative %: 57 %
Platelets: 401 10*3/uL — ABNORMAL HIGH (ref 150–400)
RBC: 3.47 MIL/uL — ABNORMAL LOW (ref 3.87–5.11)
RDW: 20.3 % — ABNORMAL HIGH (ref 11.5–15.5)
WBC: 7.3 10*3/uL (ref 4.0–10.5)
nRBC: 0 % (ref 0.0–0.2)

## 2020-01-21 LAB — LIPASE, BLOOD: Lipase: 16 U/L (ref 11–51)

## 2020-01-21 LAB — URINALYSIS, ROUTINE W REFLEX MICROSCOPIC
Bilirubin Urine: NEGATIVE
Glucose, UA: NEGATIVE mg/dL
Hgb urine dipstick: NEGATIVE
Ketones, ur: NEGATIVE mg/dL
Nitrite: NEGATIVE
Protein, ur: NEGATIVE mg/dL
Specific Gravity, Urine: 1.008 (ref 1.005–1.030)
pH: 5 (ref 5.0–8.0)

## 2020-01-21 LAB — TROPONIN I (HIGH SENSITIVITY)
Troponin I (High Sensitivity): 14 ng/L (ref <18)
Troponin I (High Sensitivity): 14 ng/L (ref <18)

## 2020-01-21 LAB — COMPREHENSIVE METABOLIC PANEL
ALT: 13 U/L (ref 0–44)
AST: 21 U/L (ref 15–41)
Albumin: 3.1 g/dL — ABNORMAL LOW (ref 3.5–5.0)
Alkaline Phosphatase: 107 U/L (ref 38–126)
Anion gap: 10 (ref 5–15)
BUN: 18 mg/dL (ref 8–23)
CO2: 26 mmol/L (ref 22–32)
Calcium: 10.6 mg/dL — ABNORMAL HIGH (ref 8.9–10.3)
Chloride: 103 mmol/L (ref 98–111)
Creatinine, Ser: 1.37 mg/dL — ABNORMAL HIGH (ref 0.44–1.00)
GFR calc Af Amer: 44 mL/min — ABNORMAL LOW (ref >60)
GFR calc non Af Amer: 38 mL/min — ABNORMAL LOW (ref >60)
Glucose, Bld: 107 mg/dL — ABNORMAL HIGH (ref 70–99)
Potassium: 3.5 mmol/L (ref 3.5–5.1)
Sodium: 139 mmol/L (ref 135–145)
Total Bilirubin: 0.5 mg/dL (ref 0.3–1.2)
Total Protein: 8.1 g/dL (ref 6.5–8.1)

## 2020-01-21 LAB — GLUCOSE, CAPILLARY
Glucose-Capillary: 61 mg/dL — ABNORMAL LOW (ref 70–99)
Glucose-Capillary: 75 mg/dL (ref 70–99)

## 2020-01-21 LAB — BRAIN NATRIURETIC PEPTIDE: B Natriuretic Peptide: 57 pg/mL (ref 0.0–100.0)

## 2020-01-21 MED ORDER — ACETAMINOPHEN 650 MG RE SUPP: 650.0000 mg | Freq: Four times a day (QID) | RECTAL | Status: DC | PRN

## 2020-01-21 MED ORDER — ONDANSETRON HCL 4 MG PO TABS: 4.0000 mg | ORAL_TABLET | Freq: Four times a day (QID) | ORAL | Status: DC | PRN

## 2020-01-21 MED ORDER — POLYETHYLENE GLYCOL 3350 17 G PO PACK: 17.0000 g | PACK | Freq: Every day | ORAL | Status: DC | PRN

## 2020-01-21 MED ORDER — SODIUM CHLORIDE 0.9 % IV SOLN
1.0000 g | Freq: Every day | INTRAVENOUS | Status: DC
  Administered 2020-01-22: 1 g via INTRAVENOUS
  Filled 2020-01-21: qty 10

## 2020-01-21 MED ORDER — ENOXAPARIN SODIUM 40 MG/0.4ML ~~LOC~~ SOLN
40.0000 mg | SUBCUTANEOUS | Status: DC
  Administered 2020-01-21 – 2020-01-22 (×2): 40 mg via SUBCUTANEOUS
  Filled 2020-01-21 (×2): qty 0.4

## 2020-01-21 MED ORDER — POTASSIUM CHLORIDE IN NACL 20-0.9 MEQ/L-% IV SOLN: INTRAVENOUS | Status: DC

## 2020-01-21 MED ORDER — AMLODIPINE BESYLATE 5 MG PO TABS
5.0000 mg | ORAL_TABLET | Freq: Every day | ORAL | Status: DC
  Administered 2020-01-21 – 2020-01-23 (×3): 5 mg via ORAL
  Filled 2020-01-21 (×3): qty 1

## 2020-01-21 MED ORDER — ONDANSETRON HCL 4 MG/2ML IJ SOLN: 4.0000 mg | Freq: Four times a day (QID) | INTRAMUSCULAR | Status: DC | PRN

## 2020-01-21 MED ORDER — INSULIN ASPART 100 UNIT/ML ~~LOC~~ SOLN
0.0000 [IU] | Freq: Three times a day (TID) | SUBCUTANEOUS | Status: DC
  Administered 2020-01-22 (×2): 2 [IU] via SUBCUTANEOUS

## 2020-01-21 MED ORDER — ATORVASTATIN CALCIUM 20 MG PO TABS
20.0000 mg | ORAL_TABLET | Freq: Every day | ORAL | Status: DC
  Administered 2020-01-21 – 2020-01-22 (×2): 20 mg via ORAL
  Filled 2020-01-21 (×2): qty 2

## 2020-01-21 MED ORDER — CLOPIDOGREL BISULFATE 75 MG PO TABS
75.0000 mg | ORAL_TABLET | Freq: Every day | ORAL | Status: DC
  Administered 2020-01-21 – 2020-01-23 (×3): 75 mg via ORAL
  Filled 2020-01-21 (×3): qty 1

## 2020-01-21 MED ORDER — ASPIRIN 81 MG PO CHEW
81.0000 mg | CHEWABLE_TABLET | Freq: Every day | ORAL | Status: DC
  Administered 2020-01-21 – 2020-01-23 (×3): 81 mg via ORAL
  Filled 2020-01-21 (×3): qty 1

## 2020-01-21 MED ORDER — SODIUM CHLORIDE 0.9 % IV BOLUS
500.0000 mL | Freq: Once | INTRAVENOUS | Status: AC
  Administered 2020-01-21: 16:00:00 500 mL via INTRAVENOUS

## 2020-01-21 MED ORDER — ACETAMINOPHEN 325 MG PO TABS: 650.0000 mg | ORAL_TABLET | Freq: Four times a day (QID) | ORAL | Status: DC | PRN

## 2020-01-21 MED ORDER — SODIUM CHLORIDE 0.9 % IV SOLN: INTRAVENOUS | Status: DC

## 2020-01-21 MED ORDER — LABETALOL HCL 200 MG PO TABS
300.0000 mg | ORAL_TABLET | Freq: Two times a day (BID) | ORAL | Status: DC
  Administered 2020-01-21 – 2020-01-23 (×4): 300 mg via ORAL
  Filled 2020-01-21 (×4): qty 2

## 2020-01-21 NOTE — H&P (Signed)
History and Physical    Sherri Brooks BHA:[id number] DOB: [birth year] DOA: 01/21/2020  PCP: Redmond School, MD   Patient coming from: Home  I have personally briefly reviewed patient's old medical records in [place]  Chief Complaint: Altered mental status  HPI: Sherri Brooks is a 74 y.o. female with medical history significant for diabetes mellitus, hypertension, myocardial infarction, tobacco abuse. Patient was brought to the ED with reports of unsteady gait and confusion since yesterday.  Of slurred speech for the past 2 days per daughter.   History is mostly obtained from chart review, at the time of my evaluation patient speech is confused, hard to tell if her speech is slurred.  ED Course: Temperature 97.6, blood pressure systolic 973Z to 329J.  O2 sats greater than 96% on room air.  WBC 7.3.  UA showed trace leukocytes few bacteria.  Portable chest x-ray negative for acute abnormality, showed nodular opacity, CT chest recommended.  Atrophic and ischemic changes without acute abnormality.  Brain MRI-motion artifact, but no acute intracranial process, mild cerebral atrophy and chronic microvascular ischemic changes.  Tissue spiculated left upper lobe pulmonary nodule measuring 2.3 x 1.1 cm highly suspicious for primary bronchogenic malignancy. Hospitalist admit for altered mental status.  Review of Systems: Unable to assess due to altered mental status.  Past Medical History:  Diagnosis Date  . Arthritis   . CAD (coronary artery disease)   . Cellulitis of buttock, left 2010  . Diabetes mellitus   . GERD (gastroesophageal reflux disease)   . Hyperlipidemia   . Hypertension   . MI (myocardial infarction) ([place])   . PVD (peripheral vascular disease) ([place])   . Wears glasses     Past Surgical History:  Procedure Laterality Date  . ABDOMINAL AORTAGRAM N/A 07/28/2012   Procedure: ABDOMINAL Maxcine Ham;  Surgeon: Angelia Mould, MD;  Location: [facility] CATH LAB;  Service:  Cardiovascular;  Laterality: N/A;  . ABDOMINAL AORTOGRAM W/LOWER EXTREMITY N/A 06/10/2017   Procedure: ABDOMINAL AORTOGRAM W/LOWER EXTREMITY;  Surgeon: Angelia Mould, MD;  Location: [facility] CV LAB;  Service: Cardiovascular;  Laterality: N/A;  . COLONOSCOPY  10/26/2011   Procedure: COLONOSCOPY;  Surgeon: Dorothyann Peng, MD;  [facility];  Service: Endoscopy;  Laterality: N/A;  1:30  . CORONARY STENT INTERVENTION N/A 06/14/2017   Procedure: CORONARY STENT INTERVENTION;  Surgeon: Leonie Man, MD;  Location: [place] CV LAB;  Service: Cardiovascular;  Laterality: N/A;  . FEMORAL-POPLITEAL BYPASS GRAFT  07/2010   pt has had 3 fem-pop bpg  . FEMORAL-POPLITEAL BYPASS GRAFT  11/04/2012   Procedure: BYPASS GRAFT FEMORAL-POPLITEAL ARTERY;  Surgeon: Angelia Mould, MD;  Location: [facility] OR;  Service: Vascular;  Laterality: Left;  Redo Left Femoral Popliteal Bypass with PTFE  . FOOT AMPUTATION  2010   left  . LEFT HEART CATH AND CORONARY ANGIOGRAPHY N/A 06/14/2017   Procedure: LEFT HEART CATH AND CORONARY ANGIOGRAPHY;  Surgeon: Leonie Man, MD;  Location: [place] CV LAB;  Service: Cardiovascular;  Laterality: N/A;  . LOWER EXTREMITY ANGIOGRAM Bilateral 07/28/2012   Procedure: LOWER EXTREMITY ANGIOGRAM;  Surgeon: Angelia Mould, MD;  Location[facility] CATH LAB;  Service: Cardiovascular;  Laterality: Bilateral;  . LOWER EXTREMITY ANGIOGRAM Left 01/02/2016   Procedure: Lower Extremity Angiogram;  Surgeon: Angelia Mould, MD;  Location: [place] CV LAB;  Service: Cardiovascular;  Laterality: Left;  . LOWER EXTREMITY ANGIOGRAM Left 07/03/2017   Procedure: Lower Extremity Angiogram;  Surgeon: Waynetta Sandy, MD;  Location: [place] CV LAB;  Service: Cardiovascular;  Laterality: Left;  . LOWER EXTREMITY INTERVENTION Left 07/03/2017   Procedure: Lower Extremity Intervention;  Surgeon: Waynetta Sandy, MD;  Location: [place] CV LAB;   Service: Cardiovascular;  Laterality: Left;  . ORIF ANKLE FRACTURE Left 01/27/2013   Procedure: OPEN REDUCTION INTERNAL FIXATION (ORIF) ANKLE FRACTURE;  Surgeon: Wylene Simmer, MD;  Location: [place];  Service: Orthopedics;  Laterality: Left;  . PERCUTANEOUS STENT INTERVENTION Left 07/28/2012   Procedure: PERCUTANEOUS STENT INTERVENTION;  Surgeon: Angelia Mould, MD;  Location: [facility] CATH LAB;  Service: Cardiovascular;  Laterality: Left;  lt ext tiliac stentx1  . PERIPHERAL VASCULAR BALLOON ANGIOPLASTY Left 06/10/2017   Procedure: PERIPHERAL VASCULAR BALLOON ANGIOPLASTY;  Surgeon: Angelia Mould, MD;  Location: [place] CV LAB;  Service: Cardiovascular;  Laterality: Left;  external iliac  . PERIPHERAL VASCULAR CATHETERIZATION N/A 01/02/2016   Procedure: Abdominal Aortogram;  Surgeon: Angelia Mould, MD;  Location: [place] CV LAB;  Service: Cardiovascular;  Laterality: N/A;  . PERIPHERAL VASCULAR CATHETERIZATION Left 01/02/2016   Procedure: Peripheral Vascular Intervention;  Surgeon: Angelia Mould, MD;  Location: [place] CV LAB;  Service: Cardiovascular;  Laterality: Left;  lt ext iliac  . right common iliac stent  10/2009   Dr Doren Custard ([place])  . S/P Hysterecotmy     partial  . TONSILLECTOMY       reports that she quit smoking about 3 years ago. Her smoking use included cigarettes. She started smoking about 7 years ago. She has a 8.00 pack-year smoking history. She has never used smokeless tobacco. She reports that she does not drink alcohol or use drugs.  Allergies  Allergen Reactions  . Hydrocodone Hives and Itching  . Iohexol Hives         Family History  Problem Relation Age of Onset  . Brain cancer Father   . Cancer Father   . Diabetes Mother        many family members  . Alzheimer's disease Mother   . Heart disease Other     Prior to Admission medications   Medication Sig Start Date End Date Taking? Authorizing Provider    albuterol (PROVENTIL HFA;VENTOLIN HFA) 108 (90 BASE) MCG/ACT inhaler Inhale 2 puffs into the lungs every 6 (six) hours as needed. For bronchitis and coughing   Yes [provider]  alendronate (FOSAMAX) 70 MG tablet Take 70 mg by mouth every 7 (seven) days. On Mondays 08/30/11  Yes [provider]  ALPRAZolam Duanne Moron) 0.5 MG tablet Take 0.5 mg by mouth 3 (three) times daily. 01/01/20  Yes [provider]  amLODipine (NORVASC) 5 MG tablet Take 1 tablet (5 mg total) by mouth daily. 06/16/17  Yes Velvet Bathe, MD  aspirin 81 MG chewable tablet Chew 1 tablet (81 mg total) by mouth daily. 06/16/17  Yes Velvet Bathe, MD  atorvastatin (LIPITOR) 20 MG tablet Take 1 tablet (20 mg total) by mouth at bedtime. Patient taking differently: Take 20 mg by mouth daily.  06/15/17  Yes Velvet Bathe, MD  clopidogrel (PLAVIX) 75 MG tablet Take 75 mg by mouth daily.     Yes [provider]  furosemide (LASIX) 20 MG tablet Take 20 mg by mouth every morning. 01/01/20  Yes [provider]  gabapentin (NEURONTIN) 100 MG capsule Take 100 mg by mouth 3 (three) times daily.    Yes [provider]  insulin glargine (LANTUS) 100 UNIT/ML injection Inject 0.2 mLs (20 Units  total) into the skin at bedtime. Patient taking differently: Inject 30 Units into the skin at bedtime.  06/15/17  Yes Velvet Bathe, MD  Iron-Vitamins (GERITOL COMPLETE PO) Take 1 tablet by mouth daily.    Yes [provider]  labetalol (NORMODYNE) 300 MG tablet Take 1 tablet (300 mg total) by mouth 2 (two) times daily. 06/15/17  Yes Velvet Bathe, MD  latanoprost (XALATAN) 0.005 % ophthalmic solution Place 1 drop into both eyes at bedtime.  01/01/20  Yes [provider]  Omega-3 Fatty Acids (FISH OIL) 1000 MG CPDR Take 1,000 mg by mouth daily.    Yes [provider]    Physical Exam: Exam limited by altered mental status.  Vitals:   01/21/20 1430 01/21/20 1653 01/21/20 1700 01/21/20 1730   BP: (!) 150/55 (!) 188/68 (!) 180/59 (!) 184/66  Pulse: 62 63 66 69  Resp: 19 19 18  (!) 24  Temp:      TempSrc:      SpO2: 95% 100% 99% 100%  Weight:      Height:        Constitutional: Sleeping but easily arousable, calm, comfortable Vitals:   01/21/20 1430 01/21/20 1653 01/21/20 1700 01/21/20 1730  BP: (!) 150/55 (!) 188/68 (!) 180/59 (!) 184/66  Pulse: 62 63 66 69  Resp: 19 19 18  (!) 24  Temp:      TempSrc:      SpO2: 95% 100% 99% 100%  Weight:      Height:       Eyes: PERRL, lids and conjunctivae normal ENMT: Unable to assess mucosa patient not following directions.  Lips appear dry. Neck: normal, supple, no masses, no thyromegaly Respiratory: clear to auscultation bilaterally, no wheezing, no crackles. Normal respiratory effort. No accessory muscle use.  Cardiovascular: Regular rate and rhythm, no murmurs / rubs / gallops. No extremity edema. 2+ pedal pulses.   Abdomen: no tenderness, no masses palpated. No hepatosplenomegaly. Bowel sounds positive.  Musculoskeletal: no clubbing / cyanosis.  Left lower extremity ray amputation.  Good ROM, no contractures..  Skin: no rashes, lesions, ulcers. No induration Neurologic: Unable to assess, no apparent facial asymmetry.  Moving all extremities spontaneously, 5/5 strength in bilateral upper extremities. Psychiatric: Speech is confused.  Able to tell me her name.  But not oriented to place time or situation.  Labs on Admission: I have personally reviewed following labs and imaging studies  CBC: Recent Labs  Lab 01/21/20 1431  WBC 7.3  NEUTROABS 4.2  HGB 9.2*  HCT 29.6*  MCV 85.3  PLT 637*   Basic Metabolic Panel: Recent Labs  Lab 01/21/20 1431  NA 139  K 3.5  CL 103  CO2 26  GLUCOSE 107*  BUN 18  CREATININE 1.37*  CALCIUM 10.6*    Liver Function Tests: Recent Labs  Lab 01/21/20 1431  AST 21  ALT 13  ALKPHOS 107  BILITOT 0.5  PROT 8.1  ALBUMIN 3.1*   Recent Labs  Lab 01/21/20 1431  LIPASE 16    Urine analysis:    Component Value Date/Time   COLORURINE STRAW (A) 01/21/2020 1400   APPEARANCEUR HAZY (A) 01/21/2020 1400   LABSPEC 1.008 01/21/2020 1400   PHURINE 5.0 01/21/2020 1400   GLUCOSEU NEGATIVE 01/21/2020 1400   HGBUR NEGATIVE 01/21/2020 1400   BILIRUBINUR NEGATIVE 01/21/2020 1400   KETONESUR NEGATIVE 01/21/2020 1400   PROTEINUR NEGATIVE 01/21/2020 1400   UROBILINOGEN 0.2 11/06/2012 1631   NITRITE NEGATIVE 01/21/2020 1400   LEUKOCYTESUR TRACE (A) 01/21/2020  [place] on Admission: DG Chest Port 1 View  Result Date: 01/21/2020 CLINICAL DATA:  Altered mental status EXAM: PORTABLE CHEST 1 VIEW COMPARISON:  None. FINDINGS: The heart size and mediastinal contours are within normal limits. Nodular opacity measuring 1.9 cm in projection projects over the left pulmonary apex. The visualized skeletal structures are unremarkable. IMPRESSION: 1.  No acute abnormality of the lungs in AP portable projection. 2. Nodular opacity measuring 1.9 cm in projection projects over the left pulmonary apex. Recommend CT to further evaluate. Electronically Signed   By: Eddie Candle M.D.   On: 01/21/2020 14:47    EKG: Independently reviewed.  Sinus rhythm, QTC 459.  Q waves 1 and aVL.  Repolarization abnormalities V3 through V6.  No significant changes compared to prior.  Assessment/Plan Principal Problem:   Acute metabolic encephalopathy Active Problems:   Diabetes mellitus with complication (HCC)   Essential hypertension   Pulmonary nodule  Acute metabolic encephalopathy-confusion, unsteady gait, slurred speech x 2 days.  Head CT unremarkable, MRI negative for acute intracranial process, motion degraded. Except for confusion her neurologic exam though limited is nonfocal.  Her UA suggest infection with trace leukocytes few bacteria, will treat as UTI as etiology for encephalopathy. ? dehydration.  She rules out for sepsis.  CT chest-no acute process, but shows spiculated  pulmonary nodule suspicious for primary bronchogenic malignancy. - Obtain urine cultures -IV ceftriaxone 1 g daily -CBC, BMP a.m. - 551ml bolus given, continue N/s + 20 KCL 100cc/hr x 15hrs -PT evaluation when able -Bedside swallow evaluation -At this time will hold off on further stroke work-up. -Hold home gabapentin, Xanax 0.5 mg 3 times daily,  Pulmonary nodule-incidental finding.Spiculated left upper lobe pulmonary nodule measuring 2.3 x 1.7 cm, highly suspicious for primary bronchogenic malignancy. -Will need to follow-up with outpatient provider  Diabetes mellitus-random glucose 107.  -Hemoglobin A1c -Hold home Lantus 20 units - SSI- s  Hypertension-elevated. -Resume home Norvasc 5 mg, labetalol 300 mg twice daily -Hold home Lasix for now  Coronary artery disease-stable.  EKG without significant change. -Resume home aspirin, Plavix, statins.  DVT prophylaxis: Lovenox Code Status: Full code Family Communication: None at bedside Disposition Plan: ~ 2 days, requiring antibiotics pending improvement in mental status. Consults called: None Admission status: Inpatient, telemetry I certify that at the point of admission it is my clinical judgment that the patient will require inpatient hospital care spanning beyond 2 midnights from the point of admission due to high intensity of service, high risk for further deterioration and high frequency of surveillance required. The following factors support the patient status of inpatient: Requiring IV antibiotics pending improvement in mental status.   Bethena Roys MD [facility]  01/21/2020, 9:40 PM

## 2020-01-21 NOTE — ED Provider Notes (Addendum)
[facility] EMERGENCY DEPARTMENT Provider Note   CSN: [id number] Arrival date & time: 01/21/20  1326     History Chief Complaint  Patient presents with  . Altered Mental Status    Sherri Brooks is a 74 y.o. female.  Patient brought in by her daughter.  Family member noted that patient had an unsteady gait and seemed to be confused since yesterday.  But does seem to be very sleepy today.  No cough congestion no fevers no nausea vomiting or diarrhea.  Patient oriented to place and able to hold conversation but not to year and month.  Past medical history significant for coronary artery disease peripheral vascular disease.  Had partial amputation of the left foot.  Patient has diabetes.        Past Medical History:  Diagnosis Date  . Arthritis   . CAD (coronary artery disease)   . Cellulitis of buttock, left 2010  . Diabetes mellitus   . GERD (gastroesophageal reflux disease)   . Hyperlipidemia   . Hypertension   . MI (myocardial infarction) ([place])   . PVD (peripheral vascular disease) ([place])   . Wears glasses     Patient Active Problem List   Diagnosis Date Noted  . Acute kidney injury ([place]) 06/10/2017  . Dyspnea 06/10/2017  . Essential hypertension 06/10/2017  . PVD (peripheral vascular disease) ([place])   . Ankle fracture, bimalleolar, closed 12/29/2012  . Anemia of unknown etiology 11/06/2012  . Atherosclerotic PVD with ulceration ([place]) 11/04/2012  . Diabetes mellitus with complication ([place]) 43/15/4008  . Tobacco use disorder 11/04/2012  . Open wound of left foot 11/04/2012  . Atherosclerosis of native arteries of the extremities with ulceration ([place]) 07/09/2012  . Weight loss, unintentional 09/18/2011  . Anorexia 09/18/2011  . Early satiety 09/18/2011  . 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/07/2011    Past Surgical History:  Procedure Laterality Date  . ABDOMINAL AORTAGRAM N/A 07/28/2012   Procedure: ABDOMINAL Maxcine Ham;  Surgeon: Angelia Mould, MD;  Location:  [facility] CATH LAB;  Service: Cardiovascular;  Laterality: N/A;  . ABDOMINAL AORTOGRAM W/LOWER EXTREMITY N/A 06/10/2017   Procedure: ABDOMINAL AORTOGRAM W/LOWER EXTREMITY;  Surgeon: Angelia Mould, MD;  Location: [place] CV LAB;  Service: Cardiovascular;  Laterality: N/A;  . COLONOSCOPY  10/26/2011   Procedure: COLONOSCOPY;  Surgeon: Dorothyann Peng, MD;  Location: [facility];  Service: Endoscopy;  Laterality: N/A;  1:30  . CORONARY STENT INTERVENTION N/A 06/14/2017   Procedure: CORONARY STENT INTERVENTION;  Surgeon: Leonie Man, MD;  Location: [place] CV LAB;  Service: Cardiovascular;  Laterality: N/A;  . FEMORAL-POPLITEAL BYPASS GRAFT  07/2010   pt has had 3 fem-pop bpg  . FEMORAL-POPLITEAL BYPASS GRAFT  11/04/2012   Procedure: BYPASS GRAFT FEMORAL-POPLITEAL ARTERY;  Surgeon: Angelia Mould, MD;  Location: [facility] OR;  Service: Vascular;  Laterality: Left;  Redo Left Femoral Popliteal Bypass with PTFE  . FOOT AMPUTATION  2010   left  . LEFT HEART CATH AND CORONARY ANGIOGRAPHY N/A 06/14/2017   Procedure: LEFT HEART CATH AND CORONARY ANGIOGRAPHY;  Surgeon: Leonie Man, MD;  Location: [place] CV LAB;  Service: Cardiovascular;  Laterality: N/A;  . LOWER EXTREMITY ANGIOGRAM Bilateral 07/28/2012   Procedure: LOWER EXTREMITY ANGIOGRAM;  Surgeon: Angelia Mould, MD;  Location: [facility] CATH LAB;  Service: Cardiovascular;  Laterality: Bilateral;  . LOWER EXTREMITY ANGIOGRAM Left 01/02/2016   Procedure: Lower Extremity Angiogram;  Surgeon: Angelia Mould, MD;  Location: [place] CV LAB;  Service:  Cardiovascular;  Laterality: Left;  . LOWER EXTREMITY ANGIOGRAM Left 07/03/2017   Procedure: Lower Extremity Angiogram;  Surgeon: Waynetta Sandy, MD;  Location: [place] CV LAB;  Service: Cardiovascular;  Laterality: Left;  . LOWER EXTREMITY INTERVENTION Left 07/03/2017   Procedure: Lower Extremity Intervention;  Surgeon: Waynetta Sandy, MD;  Location: [place] CV LAB;  Service: Cardiovascular;  Laterality: Left;  . ORIF ANKLE FRACTURE Left 01/27/2013   Procedure: OPEN REDUCTION INTERNAL FIXATION (ORIF) ANKLE FRACTURE;  Surgeon: Wylene Simmer, MD;  Location: [place];  Service: Orthopedics;  Laterality: Left;  . PERCUTANEOUS STENT INTERVENTION Left 07/28/2012   Procedure: PERCUTANEOUS STENT INTERVENTION;  Surgeon: Angelia Mould, MD;  Location: [facility] CATH LAB;  Service: Cardiovascular;  Laterality: Left;  lt ext tiliac stentx1  . PERIPHERAL VASCULAR BALLOON ANGIOPLASTY Left 06/10/2017   Procedure: PERIPHERAL VASCULAR BALLOON ANGIOPLASTY;  Surgeon: Angelia Mould, MD;  Location: [place] CV LAB;  Service: Cardiovascular;  Laterality: Left;  external iliac  . PERIPHERAL VASCULAR CATHETERIZATION N/A 01/02/2016   Procedure: Abdominal Aortogram;  Surgeon: Angelia Mould, MD;  Location: [place] CV LAB;  Service: Cardiovascular;  Laterality: N/A;  . PERIPHERAL VASCULAR CATHETERIZATION Left 01/02/2016   Procedure: Peripheral Vascular Intervention;  Surgeon: Angelia Mould, MD;  Location: [place] CV LAB;  Service: Cardiovascular;  Laterality: Left;  lt ext iliac  . right common iliac stent  10/2009   Dr Doren Custard ([place])  . S/P Hysterecotmy     partial  . TONSILLECTOMY       OB History   No obstetric history on file.     Family History  Problem Relation Age of Onset  . Brain cancer Father   . Cancer Father   . Diabetes Mother        many family members  . Alzheimer's disease Mother   . Heart disease Other     Social History   Tobacco Use  . Smoking status: Former Smoker    Packs/day: 0.40    Years: 20.00    Pack years: 8.00    Types: Cigarettes    Start date: 11/04/2012    Quit date: 03/11/2016    Years since quitting: 3.8  . Smokeless tobacco: Never Used  Substance Use Topics  . Alcohol use: No    Alcohol/week: 0.0 standard drinks  . Drug use: No    Home Medications Prior to  Admission medications   Medication Sig Start Date End Date Taking? Authorizing Provider  acetaminophen (TYLENOL) 500 MG tablet Take 500-1,000 mg by mouth 2 (two) times daily.    [provider]  albuterol (PROVENTIL HFA;VENTOLIN HFA) 108 (90 BASE) MCG/ACT inhaler Inhale 2 puffs into the lungs every 6 (six) hours as needed. For bronchitis and coughing    [provider]  alendronate (FOSAMAX) 70 MG tablet Take 70 mg by mouth every 7 (seven) days. On Mondays 08/30/11   [provider]  amLODipine (NORVASC) 5 MG tablet Take 1 tablet (5 mg total) by mouth daily. 06/16/17   Velvet Bathe, MD  aspirin 81 MG chewable tablet Chew 1 tablet (81 mg total) by mouth daily. 06/16/17   Velvet Bathe, MD  atorvastatin (LIPITOR) 20 MG tablet Take 1 tablet (20 mg total) by mouth at bedtime. Patient taking differently: Take 20 mg by mouth daily.  06/15/17   Velvet Bathe, MD  clopidogrel (PLAVIX) 75 MG tablet Take 75 mg by mouth daily.      [provider]  gabapentin (NEURONTIN) 100 MG capsule Take 100 mg by mouth 3 (three) times daily as needed (general pain).    [provider]  insulin glargine (LANTUS) 100 UNIT/ML injection Inject 0.2 mLs (20 Units total) into the skin at bedtime. Patient taking differently: Inject 30 Units into the skin See admin instructions. Check blood sugar twice daily. If sugar is close to 200 take 30 units. 06/15/17   Velvet Bathe, MD  Iron-Vitamins (GERITOL COMPLETE PO) Take 1 tablet by mouth daily.     [provider]  labetalol (NORMODYNE) 300 MG tablet Take 1 tablet (300 mg total) by mouth 2 (two) times daily. 06/15/17   Velvet Bathe, MD  LORazepam (ATIVAN) 0.5 MG tablet Take 0.5 mg by mouth at bedtime.     [provider]  Omega-3 Fatty Acids (FISH OIL) 1000 MG CPDR Take 1,000 mg by mouth daily.     [provider]    Allergies    Hydrocodone and Iohexol  Review of Systems   Review of Systems  Unable to perform  ROS: Mental status change    Physical Exam Updated Vital Signs BP (!) 150/55   Pulse 62   Temp 97.6 F (36.4 C) (Oral)   Resp 19   Ht 1.6 m (5\' 3" )   Wt 68 kg   SpO2 95%   BMI 26.57 kg/m   Physical Exam Vitals and nursing note reviewed.  Constitutional:      General: She is not in acute distress.    Appearance: Normal appearance. She is well-developed.  HENT:     Head: Normocephalic and atraumatic.  Eyes:     Extraocular Movements: Extraocular movements intact.     Conjunctiva/sclera: Conjunctivae normal.     Pupils: Pupils are equal, round, and reactive to light.  Cardiovascular:     Rate and Rhythm: Normal rate and regular rhythm.     Heart sounds: No murmur.  Pulmonary:     Effort: Pulmonary effort is normal. No respiratory distress.     Breath sounds: Normal breath sounds.  Abdominal:     Palpations: Abdomen is soft.     Tenderness: There is no abdominal tenderness.  Musculoskeletal:     Cervical back: Normal range of motion and neck supple.  Skin:    General: Skin is warm and dry.  Neurological:     General: No focal deficit present.     Mental Status: She is alert.     Comments: Patient will move all extremities.  Patient will talk normal.  But states she is very fatigued.  Somewhat somnolent.     ED Results / Procedures / Treatments   Labs (all labs ordered are listed, but only abnormal results are displayed) Labs Reviewed  CBC WITH DIFFERENTIAL/PLATELET - Abnormal; Notable for the following components:      Result Value   RBC 3.47 (*)    Hemoglobin 9.2 (*)    HCT 29.6 (*)    RDW 20.3 (*)    Platelets 401 (*)    All other components within normal limits  COMPREHENSIVE METABOLIC PANEL  LIPASE, BLOOD  URINALYSIS, ROUTINE W REFLEX MICROSCOPIC  BRAIN NATRIURETIC PEPTIDE  TROPONIN I (HIGH SENSITIVITY)  TROPONIN I (HIGH SENSITIVITY)    EKG EKG Interpretation  Date/Time:  Thursday January 21 2020 13:38:56 EDT Ventricular Rate:  65 PR Interval:      QRS Duration: 103 QT Interval:  441 QTC Calculation: 459 R Axis:   4 Text Interpretation: Sinus rhythm Short PR interval  Left ventricular hypertrophy Borderline T abnormalities, inferior leads No significant change since last tracing Confirmed by Fredia Sorrow [phone]) on 01/21/2020 1:50:49 PM   Radiology DG Chest Port 1 View  Result Date: 01/21/2020 CLINICAL DATA:  Altered mental status EXAM: PORTABLE CHEST 1 VIEW COMPARISON:  None. FINDINGS: The heart size and mediastinal contours are within normal limits. Nodular opacity measuring 1.9 cm in projection projects over the left pulmonary apex. The visualized skeletal structures are unremarkable. IMPRESSION: 1.  No acute abnormality of the lungs in AP portable projection. 2. Nodular opacity measuring 1.9 cm in projection projects over the left pulmonary apex. Recommend CT to further evaluate. Electronically Signed   By: Eddie Candle M.D.   On: 01/21/2020 14:47    Procedures Procedures (including critical care time)  Medications Ordered in ED Medications  0.9 %  sodium chloride infusion (has no administration in time range)  sodium chloride 0.9 % bolus 500 mL (has no administration in time range)    ED Course  I have reviewed the triage vital signs and the nursing notes.  Pertinent labs & imaging results that were available during my care of the patient were reviewed by me and considered in my medical decision making (see chart for details).    MDM Rules/Calculators/A&P                      Patient seems to have decreased mental status here today.  Patient has not been ambulated to assess the gait.  But does have movement of all 4 extremities.  Altered mental status work-up has been ordered including CT head chest x-ray spectrum of labs including urinalysis to see if we can find a source with altered mental status.  Patient certainly has a lot of risk factors potentially for stroke.  With her peripheral vascular disease and her  diabetes.  Patient will be turned over to evening physician Dr. Karlton Lemon to check on labs and further follow-up.  Final Clinical Impression(s) / ED Diagnoses Final diagnoses:  Altered mental status, unspecified altered mental status type    Rx / DC Orders ED Discharge Orders    None       Fredia Sorrow, MD 01/21/20 1540    Fredia Sorrow, MD 01/21/20 1541

## 2020-01-21 NOTE — ED Triage Notes (Signed)
Family member noted pt with unsteady gait and confused since yesterday.  Pt oriented to place but not year year and month.

## 2020-01-21 NOTE — ED Provider Notes (Addendum)
Care of the patient assumed at the change of shift. Introduced myself to patient and daughter at bedside. Patient alert and oriented x 2. Confused about recent events with talking out of her head/slurring speech for 2 days per daughter.  Physical Exam  BP (!) 150/55   Pulse 62   Temp 97.6 F (36.4 C) (Oral)   Resp 19   Ht 5\' 3"  (1.6 m)   Wt 68 kg   SpO2 95%   BMI 26.57 kg/m   Physical Exam Constitutional:      Appearance: Normal appearance.  HENT:     Head: Normocephalic and atraumatic.     Nose: Nose normal.     Mouth/Throat:     Mouth: Mucous membranes are moist.  Eyes:     Extraocular Movements: Extraocular movements intact.     Conjunctiva/sclera: Conjunctivae normal.  Cardiovascular:     Rate and Rhythm: Normal rate.  Pulmonary:     Effort: Pulmonary effort is normal.     Breath sounds: Normal breath sounds.  Abdominal:     General: Abdomen is flat.     Palpations: Abdomen is soft.     Tenderness: There is no abdominal tenderness.  Musculoskeletal:        General: No swelling. Normal range of motion.     Cervical back: Neck supple.  Skin:    General: Skin is warm and dry.  Neurological:     General: No focal deficit present.     Mental Status: She is alert. She is disoriented.  Psychiatric:        Mood and Affect: Mood normal.      ED Course/Procedures   Clinical Course as of Jan 20 1950  Thu Jan 21, 2020  1541 Poor IV access, multiple attempts by RN. R EJ placed with one attempt, no [place] used. IV flushes well, but does not withdraw.    [CS]  1610 CXR reviewed, will send for CT chest. Also concern for stroke given slurred speech and reported off balance gait by daughter. CT head reviewed, no large bleed, will send for MRI.    [CS]  9604 CT Head report has not crossed into Epic but from PACS:IMPRESSION: Chronic atrophic and ischemic changes without acute abnormality.   [CS]  5409 MRI Report: IMPRESSION: No acute intracranial process. Mild cerebral atrophy  and chronic microvascular ischemic changes. Left mastoid effusion. Please note that motion artifact limits evaluation.   [CS]  1725 CT Chest: IMPRESSION: 1. Spiculated left upper lobe pulmonary nodule measuring 2.3 x 1.7 cm, highly suspicious for primary bronchogenic malignancy. 2. Prominent subcarinal lymph node measuring 14 mm, nonspecific. Lack of IV contrast limits assessment for hilar adenopathy. 3. Mild emphysema and bronchial thickening. 4. Incidental cholelithiasis in the upper abdomen.   [CS]  8119 Patient still somnolent, slow to respond with slurry of speech. Unclear etiology. Discussed CT and MRI findings with patient and daughter at bedside including the possibility of lung cancer. Will plan admission for further evaluation.    [CS]  1751 Spoke with Dr. Arlyce Dice who will admit for further evaluation.    [CS]    Clinical Course User Index [CS] Truddie Hidden, MD    Angiocath insertion  Date/Time: 01/21/2020 3:43 PM Performed by: Truddie Hidden, MD Authorized by: Truddie Hidden, MD  Consent: Verbal consent obtained. Consent given by: patient Comments: 20ga Right EJ placed without [place].      MDM  MDM Number of Diagnoses or Management Options  Truddie Hidden, MD 01/21/20 Velta [place]    Truddie Hidden, MD 01/21/20 [phone]

## 2020-01-22 ENCOUNTER — Encounter: Payer: Self-pay | Admitting: Counselor

## 2020-01-22 DIAGNOSIS — N183 Chronic kidney disease, stage 3 unspecified: Secondary | ICD-10-CM

## 2020-01-22 DIAGNOSIS — I10 Essential (primary) hypertension: Secondary | ICD-10-CM

## 2020-01-22 DIAGNOSIS — R918 Other nonspecific abnormal finding of lung field: Secondary | ICD-10-CM

## 2020-01-22 DIAGNOSIS — R413 Other amnesia: Secondary | ICD-10-CM

## 2020-01-22 DIAGNOSIS — N1832 Chronic kidney disease, stage 3b: Secondary | ICD-10-CM

## 2020-01-22 LAB — IRON AND TIBC
Iron: 15 ug/dL — ABNORMAL LOW (ref 28–170)
Saturation Ratios: 4 % — ABNORMAL LOW (ref 10.4–31.8)
TIBC: 344 ug/dL (ref 250–450)
UIBC: 329 ug/dL

## 2020-01-22 LAB — GLUCOSE, CAPILLARY
Glucose-Capillary: 86 mg/dL (ref 70–99)
Glucose-Capillary: 181 mg/dL — ABNORMAL HIGH (ref 70–99)
Glucose-Capillary: 185 mg/dL — ABNORMAL HIGH (ref 70–99)
Glucose-Capillary: 86 mg/dL (ref 70–99)

## 2020-01-22 LAB — BASIC METABOLIC PANEL
Anion gap: 8 (ref 5–15)
BUN: 16 mg/dL (ref 8–23)
CO2: 25 mmol/L (ref 22–32)
Calcium: 9.2 mg/dL (ref 8.9–10.3)
Chloride: 108 mmol/L (ref 98–111)
Creatinine, Ser: 1.25 mg/dL — ABNORMAL HIGH (ref 0.44–1.00)
GFR calc Af Amer: 49 mL/min — ABNORMAL LOW (ref >60)
GFR calc non Af Amer: 42 mL/min — ABNORMAL LOW (ref >60)
Glucose, Bld: 103 mg/dL — ABNORMAL HIGH (ref 70–99)
Potassium: 3.8 mmol/L (ref 3.5–5.1)
Sodium: 141 mmol/L (ref 135–145)

## 2020-01-22 LAB — T4, FREE: Free T4: 0.75 ng/dL (ref 0.61–1.12)

## 2020-01-22 LAB — MAGNESIUM: Magnesium: 1.7 mg/dL (ref 1.7–2.4)

## 2020-01-22 LAB — AMMONIA: Ammonia: <9 umol/L — ABNORMAL LOW (ref 9–35)

## 2020-01-22 LAB — TSH: TSH: 2.074 u[IU]/mL (ref 0.350–4.500)

## 2020-01-22 LAB — CBC
HCT: 30.7 % — ABNORMAL LOW (ref 36.0–46.0)
Hemoglobin: 9.5 g/dL — ABNORMAL LOW (ref 12.0–15.0)
MCH: 26.4 pg (ref 26.0–34.0)
MCHC: 30.9 g/dL (ref 30.0–36.0)
MCV: 85.3 fL (ref 80.0–100.0)
Platelets: 373 10*3/uL (ref 150–400)
RBC: 3.6 MIL/uL — ABNORMAL LOW (ref 3.87–5.11)
RDW: 20.3 % — ABNORMAL HIGH (ref 11.5–15.5)
WBC: 7.2 10*3/uL (ref 4.0–10.5)
nRBC: 0 % (ref 0.0–0.2)

## 2020-01-22 LAB — FOLATE: Folate: 14.7 ng/mL (ref >5.9)

## 2020-01-22 LAB — SARS CORONAVIRUS 2 (TAT 6-24 HRS): SARS Coronavirus 2: NEGATIVE

## 2020-01-22 LAB — FERRITIN: Ferritin: 23 ng/mL (ref 11–307)

## 2020-01-22 LAB — VITAMIN B12: Vitamin B-12: 1042 pg/mL — ABNORMAL HIGH (ref 180–914)

## 2020-01-22 MED ORDER — MAGNESIUM SULFATE 2 GM/50ML IV SOLN
2.0000 g | Freq: Once | INTRAVENOUS | Status: AC
  Administered 2020-01-22: 2 g via INTRAVENOUS
  Filled 2020-01-22: qty 50

## 2020-01-22 MED ORDER — NICOTINE 14 MG/24HR TD PT24
14.0000 mg | MEDICATED_PATCH | Freq: Every day | TRANSDERMAL | Status: DC
  Administered 2020-01-22 – 2020-01-23 (×2): 14 mg via TRANSDERMAL
  Filled 2020-01-22 (×2): qty 1

## 2020-01-22 MED ORDER — SODIUM CHLORIDE 0.9 % IV SOLN
250.0000 mg | Freq: Once | INTRAVENOUS | Status: AC
  Administered 2020-01-22: 250 mg via INTRAVENOUS
  Filled 2020-01-22: qty 20

## 2020-01-22 NOTE — Progress Notes (Signed)
PROGRESS NOTE  Sherri Brooks RKY:[id number] DOB: 1945-10-22 DOA: 01/21/2020 PCP: Redmond School, MD  Brief History:  74 year old female with a history of hypertension, diabetes mellitus type 2, tobacco abuse, hyperlipidemia, coronary artery disease, peripheral vascular disease presenting with 4-day history of worsening confusion that began on 01/18/2020. The patient has been staying with her sister since that period of time. When she stays with her sister, the patient manages her own medications. However when the patient is at home, the patient's daughter usually dispenses her medications. Therefore, there is a concern that the patient may not be taking the appropriate medications. The patient's daughter noted the patient to have increasing confusion over the last 3 to 4 days. She stated that the patient was "talking out of her head". Daughter also states that the patient has had decreased oral intake for the last month. The patient also had increasing gait instability without any syncope or falls. There has not been any new medications. There is no complaints of fevers, chills, chest pain, shortness breath, nausea, vomiting, diarrhea, abdominal pain, dysuria, hematuria. The patient has had a new nonproductive cough. Daughter does state that the patient has had some memory loss recently although she is not able to tell me the timeframe. In the emergency department, the patient was afebrile hemodynamic stable with oxygen saturation 100% room air. BMP showed serum creatinine 1.37. Otherwise electrolytes and LFTs were unremarkable. CBC was unremarkable. EKG shows sinus rhythm with nonspecific ST changes. CT chest showed a spiculated left upper lobe nodule 2.3 x 1.7 cm. MRI of the brain was negative for acute findings except for left mastoid effusion.  Assessment/Plan: Acute metabolic encephalopathy -Secondary to UTI and dehydration -UA shows 11-20 WBC--follow culture -Serum B15 -Folic  acid -Continue IV fluids -TSH -MRI brain-neg for acute findings -Continue ceftriaxone -Holding alprazolam -Holding gabapentin -slowly improving with abx and IVF but remains confused  Spiculated pulmonary nodule -Daughter informed of findings -Outpatient PET scan  CKD stage 3b -baseline creatinine 1.2-1.5  Diabetes mellitus type 2 -NovoLog sliding scale -Hemoglobin A1c  Peripheral vascular disease -Continue aspirin and Plavix  Coronary artery disease -No chest pain presently -Personally reviewed EKG--sinus rhythm nonspecific ST changes  Essential hypertension -Continue amlodipine and labetalol  Hyperlipidemia -continue statin        Disposition Plan: Patient From: Hom D/C Place: Home - 1-2  Days Barriers: Not Clinically Stable-- remains confused  Family Communication:   Family at bedside  Consultants:    Code Status:  FULL / DNR  DVT Prophylaxis:  [place] Heparin / [place] Lovenox   Procedures: As Listed in Progress Note Above  Antibiotics: Ceftriaxone 4/8>>     Subjective: Patient denies fevers, chills, headache, chest pain, dyspnea, nausea, vomiting, diarrhea, abdominal pain, dysuria, hematuria, hematochezia, and melena.   Objective: Vitals:   01/21/20 2045 01/21/20 2200 01/22/20 0204 01/22/20 0533  BP:  (!) 167/63 137/60 (!) 149/75  Pulse:  88 70 74  Resp:  20 16 16   Temp:  97.8 F (36.6 C) 98.3 F (36.8 C) 98.4 F (36.9 C)  TempSrc:  Oral Oral Oral  SpO2: 100% 100% 100% 100%  Weight:      Height:        Intake/Output Summary (Last 24 hours) at 01/22/2020 0737 Last data filed at 01/22/2020 0647 Gross per 24 hour  Intake 1190.22 ml  Output 400 ml  Net 790.22 ml   Weight change:  Exam:   General:  Pt is  alert, follows commands appropriately, not in acute distress  HEENT: No icterus, No thrush, No neck mass, [place]/AT  Cardiovascular: RRR, S1/S2, no rubs, no gallops  Respiratory: fine bibasilar crackles. No wheeze  Abdomen: Soft/+BS,  non tender, non distended, no guarding  Extremities: No edema, No lymphangitis, No petechiae, No rashes, no synovitis   Data Reviewed: I have personally reviewed following labs and imaging studies Basic Metabolic Panel: Recent Labs  Lab 01/21/20 1431 01/22/20 0601  NA 139 141  K 3.5 3.8  CL 103 108  CO2 26 25  GLUCOSE 107* 103*  BUN 18 16  CREATININE 1.37* 1.25*  CALCIUM 10.6* 9.2   Liver Function Tests: Recent Labs  Lab 01/21/20 1431  AST 21  ALT 13  ALKPHOS 107  BILITOT 0.5  PROT 8.1  ALBUMIN 3.1*   Recent Labs  Lab 01/21/20 1431  LIPASE 16   No results for input(s): AMMONIA in the last 168 hours. Coagulation Profile: No results for input(s): INR, PROTIME in the last 168 hours. CBC: Recent Labs  Lab 01/21/20 1431 01/22/20 0601  WBC 7.3 7.2  NEUTROABS 4.2  --   HGB 9.2* 9.5*  HCT 29.6* 30.7*  MCV 85.3 85.3  PLT 401* 373   Cardiac Enzymes: No results for input(s): CKTOTAL, CKMB, CKMBINDEX, TROPONINI in the last 168 hours. BNP: Invalid input(s): POCBNP CBG: Recent Labs  Lab 01/21/20 2014 01/21/20 2048  GLUCAP 61* 75   HbA1C: No results for input(s): HGBA1C in the last 72 hours. Urine analysis:    Component Value Date/Time   COLORURINE STRAW (A) 01/21/2020 1400   APPEARANCEUR HAZY (A) 01/21/2020 1400   LABSPEC 1.008 01/21/2020 1400   PHURINE 5.0 01/21/2020 1400   GLUCOSEU NEGATIVE 01/21/2020 1400   HGBUR NEGATIVE 01/21/2020 1400   BILIRUBINUR NEGATIVE 01/21/2020 1400   KETONESUR NEGATIVE 01/21/2020 1400   PROTEINUR NEGATIVE 01/21/2020 1400   UROBILINOGEN 0.2 11/06/2012 1631   NITRITE NEGATIVE 01/21/2020 1400   LEUKOCYTESUR TRACE (A) 01/21/2020 1400   Sepsis Labs: @LABRCNTIP (procalcitonin:4,lacticidven:4) ) Recent Results (from the past 240 hour(s))  SARS CORONAVIRUS 2 (Aryia Delira 6-24 HRS) Nasopharyngeal Nasopharyngeal Swab     Status: None   Collection Time: 01/21/20  6:12 PM   Specimen: Nasopharyngeal Swab  Result Value Ref Range Status    SARS Coronavirus 2 NEGATIVE NEGATIVE Final    Comment: (NOTE) SARS-CoV-2 target nucleic acids are NOT DETECTED. The SARS-CoV-2 RNA is generally detectable in upper and lower respiratory specimens during the acute phase of infection. Negative results do not preclude SARS-CoV-2 infection, do not rule out co-infections with other pathogens, and should not be used as the sole basis for treatment or other patient management decisions. Negative results must be combined with clinical observations, patient history, and epidemiological information. The expected result is Negative. Fact Sheet for Patients: SugarRoll.be Fact Sheet for Healthcare Providers: https://www.woods-mathews.com/ This test is not yet approved or cleared by the [place] FDA and  has been authorized for detection and/or diagnosis of SARS-CoV-2 by FDA under an Emergency Use Authorization (EUA). This EUA will remain  in effect (meaning this test can be used) for the duration of the COVID-19 declaration under Section 56 4(b)(1) of the Act, 21 U.S.C. section 360bbb-3(b)(1), unless the authorization is terminated or revoked sooner. Performed at [facility], [place] [address], [place], [place] [address]      Scheduled Meds: . amLODipine  5 mg Oral Daily  . aspirin  81 mg Oral Daily  . atorvastatin  20 mg  Oral QHS  . clopidogrel  75 mg Oral Daily  . enoxaparin (LOVENOX) injection  40 mg Subcutaneous Q24H  . insulin aspart  0-9 Units Subcutaneous TID WC  . labetalol  300 mg Oral BID   Continuous Infusions: . 0.9 % NaCl with KCl 20 mEq / L 100 mL/hr at 01/22/20 0359  . cefTRIAXone (ROCEPHIN)  IV      Procedures/Studies: CT Head Wo Contrast  Result Date: 01/21/2020 CLINICAL DATA:  Altered mental status and confusion for 2 days EXAM: CT HEAD WITHOUT CONTRAST TECHNIQUE: Contiguous axial images were obtained from the base of the skull through the vertex without intravenous  contrast. COMPARISON:  None. FINDINGS: Brain: No evidence of acute infarction, hemorrhage, hydrocephalus, extra-axial collection or mass lesion/mass effect. Chronic atrophic and white matter ischemic changes are noted. Vascular: No hyperdense vessel or unexpected calcification. Skull: Normal. Negative for fracture or focal lesion. Sinuses/Orbits: No acute finding. Other: None. IMPRESSION: Chronic atrophic and ischemic changes without acute abnormality. Electronically Signed   By: Inez Catalina M.D.   On: 01/21/2020 15:33   CT Chest Wo Contrast  Result Date: 01/21/2020 CLINICAL DATA:  Abnormal xray - lung nodule, >= 1 cm EXAM: CT CHEST WITHOUT CONTRAST TECHNIQUE: Multidetector CT imaging of the chest was performed following the standard protocol without IV contrast. COMPARISON:  Radiograph earlier this day. FINDINGS: Cardiovascular: Diffuse aortic atherosclerosis. No aortic aneurysm. Dense coronary artery calcifications. No pericardial effusion. The heart is normal in size. Mediastinum/Nodes: Prominent subcarinal node measuring 14 mm. Nonspecific prominent lower paratracheal nodes measuring up to 8 mm. Limited assessment for hilar adenopathy in the absence of IV contrast. No esophageal wall thickening. No suspicious thyroid nodule. Lungs/Pleura: Spiculated left upper lobe pulmonary nodule measuring 2.3 x 1.7 cm, series 4, image 22. no other pulmonary nodule or mass, allowing for mild motion artifact. Mild emphysema and bronchial thickening. Linear opacities in the right and left lower lobe are likely subsegmental atelectasis. No pleural fluid. Upper Abdomen: Gallstones. No adrenal nodule. No evidence of focal hepatic lesion or metastatic disease in the upper abdomen. Musculoskeletal: No lytic or blastic bone lesion. No acute osseous abnormality. IMPRESSION: 1. Spiculated left upper lobe pulmonary nodule measuring 2.3 x 1.7 cm, highly suspicious for primary bronchogenic malignancy. 2. Prominent subcarinal lymph node  measuring 14 mm, nonspecific. Lack of IV contrast limits assessment for hilar adenopathy. 3. Mild emphysema and bronchial thickening. 4. Incidental cholelithiasis in the upper abdomen. Aortic Atherosclerosis (ICD10-I70.0) and Emphysema (ICD10-J43.9). Electronically Signed   By: Keith Rake M.D.   On: 01/21/2020 17:20   MR Brain Wo Contrast (neuro protocol)  Result Date: 01/21/2020 CLINICAL DATA:  Ataxia, stroke suspected. Unsteady gait and confusion since yesterday. EXAM: MRI HEAD WITHOUT CONTRAST TECHNIQUE: Multiplanar, multiecho pulse sequences of the brain and surrounding structures were obtained without intravenous contrast. COMPARISON:  01/21/2020 head CT.  05/11/2016 MRI head. FINDINGS: Please note that motion artifact limits evaluation. Brain: No focal diffusion-weighted signal abnormality. Mild diffuse parenchymal volume loss with ex vacuo dilatation. No midline shift, mass lesion or extra-axial fluid collection. Scattered T2/FLAIR hyperintense foci involving the periventricular and subcortical white matter are unchanged and nonspecific however commonly associated with chronic microvascular ischemic changes. No intracranial hemorrhage. Vascular: Normal flow voids. Skull and upper cervical spine: Normal marrow signal. Sinuses/Orbits: Normal orbits. Left mastoid effusion. Clear paranasal sinuses. Other: None. IMPRESSION: No acute intracranial process. Mild cerebral atrophy and chronic microvascular ischemic changes. Left mastoid effusion. Please note that motion artifact limits evaluation. Electronically Signed   By:  Primitivo Gauze M.D.   On: 01/21/2020 16:52   DG Chest Port 1 View  Result Date: 01/21/2020 CLINICAL DATA:  Altered mental status EXAM: PORTABLE CHEST 1 VIEW COMPARISON:  None. FINDINGS: The heart size and mediastinal contours are within normal limits. Nodular opacity measuring 1.9 cm in projection projects over the left pulmonary apex. The visualized skeletal structures are  unremarkable. IMPRESSION: 1.  No acute abnormality of the lungs in AP portable projection. 2. Nodular opacity measuring 1.9 cm in projection projects over the left pulmonary apex. Recommend CT to further evaluate. Electronically Signed   By: Eddie Candle M.D.   On: 01/21/2020 14:47    Orson Eva, DO  [facility]  If 7PM-7AM, please contact night-coverage [email or web address] Password [email or web address] 01/22/2020, 7:37 AM   LOS: 1 day

## 2020-01-22 NOTE — Plan of Care (Signed)
  Problem: Nutrition: Goal: Adequate nutrition will be maintained Outcome: Progressing   Problem: Education: Goal: Knowledge of General Education information will improve Description: Including pain rating scale, medication(s)/side effects and non-pharmacologic comfort measures Outcome: Not Progressing

## 2020-01-22 NOTE — Evaluation (Signed)
Physical Therapy Evaluation Patient Details Name: Sherri Brooks MRN: [id number] DOB: 11-09-1945 Today's Date: 01/22/2020   History of Present Illness  Sherri Brooks is a 74 y.o. female with medical history significant for diabetes mellitus, hypertension, myocardial infarction, tobacco abuse.Patient was brought to the ED with reports of unsteady gait and confusion since yesterday.  Of slurred speech for the past 2 days per daughter.  History is mostly obtained from chart review, at the time of my evaluation patient speech is confused, hard to tell if her speech is slurred.    Clinical Impression  Patient limited for functional mobility as stated below secondary to BLE weakness, fatigue and poor standing balance. Patient attempting to get out of bed upon entrance to room for session. She appears to be confused and a poor historian of PLOF/home set up. She uses RW to tranfer to standing and for ambulation and requires assist for balance and safety. She drifts right and left during ambulation despite cues to stay in the middle of the hallway. Patient returned to room and assisted back into bed - RN aware. She states she is very fatigued following ambulation. Patient will benefit from continued physical therapy in hospital and recommended venue below to increase strength, balance, endurance for safe ADLs and gait.     Follow Up Recommendations Home health PT;Supervision for mobility/OOB;Supervision/Assistance - 24 hour    Equipment Recommendations  None recommended by PT    Recommendations for Other Services       Precautions / Restrictions Precautions Precautions: Fall Restrictions Weight Bearing Restrictions: No      Mobility  Bed Mobility Overal bed mobility: Needs Assistance Bed Mobility: Supine to Sit;Sit to Supine     Supine to sit: Supervision Sit to supine: Supervision   General bed mobility comments: Patient attempting to get out of bed upon entrance for  session  Transfers Overall transfer level: Needs assistance Equipment used: Rolling walker (2 wheeled) Transfers: Sit to/from [facility] Sit to Stand: Min guard Stand pivot transfers: Min guard       General transfer comment: min guard for safety and balance, patient able to transfer to standing without much difficulty but demonstrates unsteadiness upon standing with RW  Ambulation/Gait Ambulation/Gait assistance: Min guard Gait Distance (Feet): 40 Feet Assistive device: Rolling walker (2 wheeled) Gait Pattern/deviations: Drifts right/left Gait velocity: decreased   General Gait Details: ambulates with decreased cadence with use of RW, guard for balance and safety, patient drifting to R/L despite verbal cueing to stay in the middle of the hallway, fatigued at end of ambulation  Stairs            Wheelchair Mobility    Modified Rankin (Stroke Patients Only)       Balance Overall balance assessment: Needs assistance Sitting-balance support: No upper extremity supported;Feet supported Sitting balance-Leahy Scale: Normal Sitting balance - Comments: seated EOB   Standing balance support: Bilateral upper extremity supported;During functional activity Standing balance-Leahy Scale: Poor Standing balance comment: fair/poor requires RW for balance                             Pertinent Vitals/Pain Pain Assessment: No/denies pain    Home Living Family/patient expects to be discharged to:: Private residence Living Arrangements: Other relatives Available Help at Discharge: Family           Home Equipment: Gilford Rile - 2 wheels;Walker - 4 wheels;Cane - single point Additional Comments: Patient appears to be confused  and is a poor historian    Prior Function Level of Independence: Needs assistance   Gait / Transfers Assistance Needed: Patient states household ambulator with rollator  ADL's / Homemaking Assistance Needed: family  assists  Comments: Patient appears to be a poor historian     Hand Dominance        Extremity/Trunk Assessment   Upper Extremity Assessment Upper Extremity Assessment: Overall WFL for tasks assessed    Lower Extremity Assessment Lower Extremity Assessment: Generalized weakness    Cervical / Trunk Assessment Cervical / Trunk Assessment: Normal  Communication      Cognition Arousal/Alertness: Awake/alert Behavior During Therapy: WFL for tasks assessed/performed Overall Cognitive Status: No family/caregiver present to determine baseline cognitive functioning                                        General Comments      Exercises     Assessment/Plan    PT Assessment Patient needs continued PT services  PT Problem List Decreased strength;Decreased knowledge of use of DME;Decreased activity tolerance;Decreased safety awareness;Decreased balance;Decreased mobility       PT Treatment Interventions DME instruction;Balance training;Gait training;Neuromuscular re-education;Stair training;Functional mobility training;Patient/family education;Therapeutic activities;Therapeutic exercise    PT Goals (Current goals can be found in the Care Plan section)  Acute Rehab PT Goals Patient Stated Goal: Return home PT Goal Formulation: With patient Time For Goal Achievement: 02/05/20 Potential to Achieve Goals: Fair    Frequency Min 3X/week   Barriers to discharge        Co-evaluation               AM-PAC PT "6 Clicks" Mobility  Outcome Measure Help needed turning from your back to your side while in a flat bed without using bedrails?: None Help needed moving from lying on your back to sitting on the side of a flat bed without using bedrails?: None Help needed moving to and from a bed to a chair (including a wheelchair)?: A Little Help needed standing up from a chair using your arms (e.g., wheelchair or bedside chair)?: A Little Help needed to walk in  hospital room?: A Little Help needed climbing 3-5 steps with a railing? : A Lot 6 Click Score: 19    End of Session Equipment Utilized During Treatment: Gait belt Activity Tolerance: Patient tolerated treatment well;Patient limited by fatigue Patient left: in bed;with call bell/phone within reach;with bed alarm set Nurse Communication: Mobility status PT Visit Diagnosis: Unsteadiness on feet (R26.81);Other abnormalities of gait and mobility (R26.89);Muscle weakness (generalized) (M62.81)    Time: 9381-8299 PT Time Calculation (min) (ACUTE ONLY): 14 min   Charges:   PT Evaluation $PT Eval Moderate Complexity: 1 Mod          2:41 PM, 01/22/20 Mearl Latin PT, DPT Physical Therapist at [facility]

## 2020-01-22 NOTE — TOC Transition Note (Signed)
Transition of Care [facility]) - CM/SW Discharge Note   Patient Details  Name: Simren Popson MRN: [id number] Date of Birth: July 29, 1946  [facility] of [facility]) CM/SW Contact:  Lanyia Jewel, Chauncey Reading, RN Phone Number: 01/22/2020, 2:35 PM   Clinical Narrative:  Acute metabolic encephalopathy.  From home with daughter, Levada Dy. Has been splitting her time between her dtr. And sister. Discussed home health PT recommendation. Angela agreeable. No preference on provider. Referred to [facility].   Levada Dy states that her mom will be staying with her indefinitley and someone will be with patient 24/7. Has a RW at home.  Has a PCP, daughter drives patient to appointments.  No issues obtaining medication, meds are pre fill/ packaged for patient.       Barriers to Discharge: Continued Medical Work up   Patient Goals and CMS Choice Patient states their goals for this hospitalization and ongoing recovery are:: daughter would like patient to return, patient will stay with daugther 24/7 CMS Medicare.gov Compare Post Acute Care list provided to:: Other (Comment Required)(dtr.) Choice offered to / list presented to : Adult Children      Discharge Plan and Services   Discharge Planning Services: CM Consult Post Acute Care Choice: Home Health                    HH Arranged: PT [place]: [place] Date [facility] Agency Contacted: 01/22/20 Time [place]: 6314 Representative spoke with at [place]: [place] Determinants of Health ([place]) Interventions     Readmission Risk Interventions No flowsheet data found.

## 2020-01-22 NOTE — Care Management Important Message (Signed)
Important Message  Patient Details  Name: Sherri Brooks MRN: [id number] Date of Birth: [birth year]   Medicare Important Message Given:  Yes     Tommy Medal 01/22/2020, 2:45 PM

## 2020-01-22 NOTE — Plan of Care (Signed)
  Problem: Acute Rehab PT Goals(only PT should resolve) Goal: Patient Will Transfer Sit To/From Stand Outcome: Progressing Flowsheets (Taken 01/22/2020 1443) Patient will transfer sit to/from stand: with supervision Goal: Pt Will Transfer Bed To Chair/Chair To Bed Outcome: Progressing Flowsheets (Taken 01/22/2020 1443) Pt will Transfer Bed to Chair/Chair to Bed: with supervision Goal: Pt Will Ambulate Outcome: Progressing Flowsheets (Taken 01/22/2020 1443) Pt will Ambulate:  75 feet  with supervision  with rolling walker  2:44 PM, 01/22/20 Mearl Latin PT, DPT Physical Therapist at [facility]

## 2020-01-23 LAB — GLUCOSE, CAPILLARY
Glucose-Capillary: 110 mg/dL — ABNORMAL HIGH (ref 70–99)
Glucose-Capillary: 116 mg/dL — ABNORMAL HIGH (ref 70–99)

## 2020-01-23 LAB — CBC
HCT: 27 % — ABNORMAL LOW (ref 36.0–46.0)
Hemoglobin: 8.4 g/dL — ABNORMAL LOW (ref 12.0–15.0)
MCH: 26.5 pg (ref 26.0–34.0)
MCHC: 31.1 g/dL (ref 30.0–36.0)
MCV: 85.2 fL (ref 80.0–100.0)
Platelets: 344 10*3/uL (ref 150–400)
RBC: 3.17 MIL/uL — ABNORMAL LOW (ref 3.87–5.11)
RDW: 20.2 % — ABNORMAL HIGH (ref 11.5–15.5)
WBC: 7 10*3/uL (ref 4.0–10.5)
nRBC: 0 % (ref 0.0–0.2)

## 2020-01-23 LAB — BASIC METABOLIC PANEL
Anion gap: 11 (ref 5–15)
BUN: 14 mg/dL (ref 8–23)
CO2: 24 mmol/L (ref 22–32)
Calcium: 8.8 mg/dL — ABNORMAL LOW (ref 8.9–10.3)
Chloride: 105 mmol/L (ref 98–111)
Creatinine, Ser: 1.22 mg/dL — ABNORMAL HIGH (ref 0.44–1.00)
GFR calc Af Amer: 50 mL/min — ABNORMAL LOW (ref >60)
GFR calc non Af Amer: 43 mL/min — ABNORMAL LOW (ref >60)
Glucose, Bld: 110 mg/dL — ABNORMAL HIGH (ref 70–99)
Potassium: 3.6 mmol/L (ref 3.5–5.1)
Sodium: 140 mmol/L (ref 135–145)

## 2020-01-23 LAB — MAGNESIUM: Magnesium: 2.3 mg/dL (ref 1.7–2.4)

## 2020-01-23 LAB — HEMOGLOBIN A1C
Hgb A1c MFr Bld: 7.5 % — ABNORMAL HIGH (ref 4.8–5.6)
Mean Plasma Glucose: 169 mg/dL

## 2020-01-23 MED ORDER — CEFDINIR 300 MG PO CAPS: 300.0000 mg | ORAL_CAPSULE | Freq: Two times a day (BID) | ORAL | 0 refills | Status: DC

## 2020-01-23 MED ORDER — FERROUS SULFATE 325 (65 FE) MG PO TABS: 325.0000 mg | ORAL_TABLET | Freq: Every day | ORAL | Status: DC

## 2020-01-23 MED ORDER — FERROUS SULFATE 325 (65 FE) MG PO TABS: 325.0000 mg | ORAL_TABLET | Freq: Every day | ORAL | 3 refills | Status: AC

## 2020-01-23 MED ORDER — CEFDINIR 300 MG PO CAPS: 300.0000 mg | ORAL_CAPSULE | Freq: Two times a day (BID) | ORAL | Status: DC

## 2020-01-23 NOTE — Discharge Summary (Signed)
Physician Discharge Summary  Sherri Brooks BMW:[id number] DOB: 1946/05/17 DOA: 01/21/2020  PCP: Redmond School, MD  Admit date: 01/21/2020 Discharge date: 01/23/2020  Admitted From: Home Disposition:  Home  Recommendations for Outpatient Follow-up:  1. Follow up with PCP in 1-2 weeks 2. Please order PET scan of lung for suspicious lung nodule and consider referral to IR for biopsy     Discharge Condition: Stable CODE STATUS: FULL Diet recommendation: Heart Healthy   Brief/Interim Summary: 74 year old female with a history of hypertension, diabetes mellitus type 2, tobacco abuse, hyperlipidemia, coronary artery disease, peripheral vascular disease presenting with 4-day history of worsening confusion that began on 01/18/2020. The patient has been staying with her sister since that period of time. When she stays with her sister, the patient manages her own medications. However when the patient is at home, the patient's daughter usually dispenses her medications. Therefore, there is a concern that the patient may not be taking the appropriate medications. The patient's daughter noted the patient to have increasing confusion over the last 3 to 4 days. She stated that the patient was "talking out of her head". Daughter also states that the patient has had decreased oral intake for the last month. The patient also had increasing gait instability without any syncope or falls. There has not been any new medications. There is no complaints of fevers, chills, chest pain, shortness breath, nausea, vomiting, diarrhea, abdominal pain, dysuria, hematuria. The patient has had a new nonproductive cough. Daughter does state that the patient has had some memory loss recently although she is not able to tell me the timeframe. In the emergency department, the patient was afebrile hemodynamic stable with oxygen saturation 100% room air. BMP showed serum creatinine 1.37. Otherwise electrolytes and LFTs were  unremarkable. CBC was unremarkable. EKG shows sinus rhythm with nonspecific ST changes. CT chest showed a spiculated left upper lobe nodule 2.3 x 1.7 cm. MRI of the brain was negative for acute findings except for left mastoid effusion.  Discharge Diagnoses:  Acute metabolic encephalopathy -Secondary to UTI and dehydration -UA shows 11-20 WBC--follow culture--pending at time of d/c -Serum U72--5366 -Folic YQIH--47.4 -Continue IV fluids -TSH--2.074 -MRI brain-neg for acute findings -Continue ceftriaxone>>d/c with 4 more days of cefdinir -Holding alprazolam during hospitalization -Holding gabapentin during hospitalization -4/10--improving, near baseline -pt has underlying cognitive impairment-->out pt referral to neuropsychology  Spiculated pulmonary nodule -Daughter informed of findings -Outpatient PET scan -follow up PCP  CKD stage 3b -baseline creatinine 1.2-1.5 -serum creatinine 1.22 on day of d/c  Diabetes mellitus type 2 -NovoLog sliding scale -4/9 Hemoglobin A1c--7.5  Peripheral vascular disease -Continue aspirin and Plavix  Coronary artery disease -No chest pain presently -Personally reviewed EKG--sinus rhythm nonspecific ST changes  Essential hypertension -Continue amlodipine and labetalol  Hyperlipidemia -continue statin     Discharge Instructions  Discharge Instructions    Ambulatory referral to Neuropsychology   Complete by: As directed      Allergies as of 01/23/2020      Reactions   Hydrocodone Hives, Itching   Iohexol Hives         Medication List    STOP taking these medications   gabapentin 100 MG capsule Commonly known as: NEURONTIN     TAKE these medications   albuterol 108 (90 Base) MCG/ACT inhaler Commonly known as: VENTOLIN HFA Inhale 2 puffs into the lungs every 6 (six) hours as needed. For bronchitis and coughing   alendronate 70 MG tablet Commonly known as: FOSAMAX Take 70 mg by mouth  every 7 (seven) days. On  Mondays   ALPRAZolam 0.5 MG tablet Commonly known as: XANAX Take 0.5 mg by mouth 3 (three) times daily.   amLODipine 5 MG tablet Commonly known as: NORVASC Take 1 tablet (5 mg total) by mouth daily.   aspirin 81 MG chewable tablet Chew 1 tablet (81 mg total) by mouth daily.   atorvastatin 20 MG tablet Commonly known as: LIPITOR Take 1 tablet (20 mg total) by mouth at bedtime. What changed: when to take this   cefdinir 300 MG capsule Commonly known as: OMNICEF Take 1 capsule (300 mg total) by mouth every 12 (twelve) hours.   clopidogrel 75 MG tablet Commonly known as: PLAVIX Take 75 mg by mouth daily.   ferrous sulfate 325 (65 FE) MG tablet Take 1 tablet (325 mg total) by mouth daily with breakfast. Start taking on: January 24, 2020   Fish Oil 1000 MG Cpdr Take 1,000 mg by mouth daily.   furosemide 20 MG tablet Commonly known as: LASIX Take 20 mg by mouth every morning.   GERITOL COMPLETE PO Take 1 tablet by mouth daily.   insulin glargine 100 UNIT/ML injection Commonly known as: LANTUS Inject 0.2 mLs (20 Units total) into the skin at bedtime. What changed: how much to take   labetalol 300 MG tablet Commonly known as: NORMODYNE Take 1 tablet (300 mg total) by mouth 2 (two) times daily.   latanoprost 0.005 % ophthalmic solution Commonly known as: XALATAN Place 1 drop into both eyes at bedtime.       Allergies  Allergen Reactions   Hydrocodone Hives and Itching   Iohexol Hives         Consultations:  none   Procedures/Studies: CT Head Wo Contrast  Result Date: 01/21/2020 CLINICAL DATA:  Altered mental status and confusion for 2 days EXAM: CT HEAD WITHOUT CONTRAST TECHNIQUE: Contiguous axial images were obtained from the base of the skull through the vertex without intravenous contrast. COMPARISON:  None. FINDINGS: Brain: No evidence of acute infarction, hemorrhage, hydrocephalus, extra-axial collection or mass lesion/mass effect. Chronic atrophic  and white matter ischemic changes are noted. Vascular: No hyperdense vessel or unexpected calcification. Skull: Normal. Negative for fracture or focal lesion. Sinuses/Orbits: No acute finding. Other: None. IMPRESSION: Chronic atrophic and ischemic changes without acute abnormality. Electronically Signed   By: Inez Catalina M.D.   On: 01/21/2020 15:33   CT Chest Wo Contrast  Result Date: 01/21/2020 CLINICAL DATA:  Abnormal xray - lung nodule, >= 1 cm EXAM: CT CHEST WITHOUT CONTRAST TECHNIQUE: Multidetector CT imaging of the chest was performed following the standard protocol without IV contrast. COMPARISON:  Radiograph earlier this day. FINDINGS: Cardiovascular: Diffuse aortic atherosclerosis. No aortic aneurysm. Dense coronary artery calcifications. No pericardial effusion. The heart is normal in size. Mediastinum/Nodes: Prominent subcarinal node measuring 14 mm. Nonspecific prominent lower paratracheal nodes measuring up to 8 mm. Limited assessment for hilar adenopathy in the absence of IV contrast. No esophageal wall thickening. No suspicious thyroid nodule. Lungs/Pleura: Spiculated left upper lobe pulmonary nodule measuring 2.3 x 1.7 cm, series 4, image 22. no other pulmonary nodule or mass, allowing for mild motion artifact. Mild emphysema and bronchial thickening. Linear opacities in the right and left lower lobe are likely subsegmental atelectasis. No pleural fluid. Upper Abdomen: Gallstones. No adrenal nodule. No evidence of focal hepatic lesion or metastatic disease in the upper abdomen. Musculoskeletal: No lytic or blastic bone lesion. No acute osseous abnormality. IMPRESSION: 1. Spiculated left upper lobe pulmonary nodule  measuring 2.3 x 1.7 cm, highly suspicious for primary bronchogenic malignancy. 2. Prominent subcarinal lymph node measuring 14 mm, nonspecific. Lack of IV contrast limits assessment for hilar adenopathy. 3. Mild emphysema and bronchial thickening. 4. Incidental cholelithiasis in the  upper abdomen. Aortic Atherosclerosis (ICD10-I70.0) and Emphysema (ICD10-J43.9). Electronically Signed   By: Keith Rake M.D.   On: 01/21/2020 17:20   MR Brain Wo Contrast (neuro protocol)  Result Date: 01/21/2020 CLINICAL DATA:  Ataxia, stroke suspected. Unsteady gait and confusion since yesterday. EXAM: MRI HEAD WITHOUT CONTRAST TECHNIQUE: Multiplanar, multiecho pulse sequences of the brain and surrounding structures were obtained without intravenous contrast. COMPARISON:  01/21/2020 head CT.  05/11/2016 MRI head. FINDINGS: Please note that motion artifact limits evaluation. Brain: No focal diffusion-weighted signal abnormality. Mild diffuse parenchymal volume loss with ex vacuo dilatation. No midline shift, mass lesion or extra-axial fluid collection. Scattered T2/FLAIR hyperintense foci involving the periventricular and subcortical white matter are unchanged and nonspecific however commonly associated with chronic microvascular ischemic changes. No intracranial hemorrhage. Vascular: Normal flow voids. Skull and upper cervical spine: Normal marrow signal. Sinuses/Orbits: Normal orbits. Left mastoid effusion. Clear paranasal sinuses. Other: None. IMPRESSION: No acute intracranial process. Mild cerebral atrophy and chronic microvascular ischemic changes. Left mastoid effusion. Please note that motion artifact limits evaluation. Electronically Signed   By: Primitivo Gauze M.D.   On: 01/21/2020 16:52   DG Chest Port 1 View  Result Date: 01/21/2020 CLINICAL DATA:  Altered mental status EXAM: PORTABLE CHEST 1 VIEW COMPARISON:  None. FINDINGS: The heart size and mediastinal contours are within normal limits. Nodular opacity measuring 1.9 cm in projection projects over the left pulmonary apex. The visualized skeletal structures are unremarkable. IMPRESSION: 1.  No acute abnormality of the lungs in AP portable projection. 2. Nodular opacity measuring 1.9 cm in projection projects over the left pulmonary  apex. Recommend CT to further evaluate. Electronically Signed   By: Eddie Candle M.D.   On: 01/21/2020 14:47         Discharge Exam: Vitals:   01/23/20 1226 01/23/20 1334  BP:  (!) 145/46  Pulse: 68 69  Resp:    Temp:  98 F (36.7 C)  SpO2:  100%   Vitals:   01/23/20 0530 01/23/20 0900 01/23/20 1226 01/23/20 1334  BP: (!) 158/69 (!) 159/56  (!) 145/46  Pulse: 76 72 68 69  Resp: 19     Temp: 97.7 F (36.5 C)   98 F (36.7 C)  TempSrc:    Oral  SpO2: 99%   100%  Weight:      Height:        General: Pt is alert, awake, not in acute distress Cardiovascular: RRR, S1/S2 +, no rubs, no gallops Respiratory: CTA bilaterally, no wheezing, no rhonchi Abdominal: Soft, NT, ND, bowel sounds + Extremities: no edema, no cyanosis   The results of significant diagnostics from this hospitalization (including imaging, microbiology, ancillary and laboratory) are listed below for reference.    Significant Diagnostic Studies: CT Head Wo Contrast  Result Date: 01/21/2020 CLINICAL DATA:  Altered mental status and confusion for 2 days EXAM: CT HEAD WITHOUT CONTRAST TECHNIQUE: Contiguous axial images were obtained from the base of the skull through the vertex without intravenous contrast. COMPARISON:  None. FINDINGS: Brain: No evidence of acute infarction, hemorrhage, hydrocephalus, extra-axial collection or mass lesion/mass effect. Chronic atrophic and white matter ischemic changes are noted. Vascular: No hyperdense vessel or unexpected calcification. Skull: Normal. Negative for fracture or focal lesion. Sinuses/Orbits: No  acute finding. Other: None. IMPRESSION: Chronic atrophic and ischemic changes without acute abnormality. Electronically Signed   By: Inez Catalina M.D.   On: 01/21/2020 15:33   CT Chest Wo Contrast  Result Date: 01/21/2020 CLINICAL DATA:  Abnormal xray - lung nodule, >= 1 cm EXAM: CT CHEST WITHOUT CONTRAST TECHNIQUE: Multidetector CT imaging of the chest was performed following  the standard protocol without IV contrast. COMPARISON:  Radiograph earlier this day. FINDINGS: Cardiovascular: Diffuse aortic atherosclerosis. No aortic aneurysm. Dense coronary artery calcifications. No pericardial effusion. The heart is normal in size. Mediastinum/Nodes: Prominent subcarinal node measuring 14 mm. Nonspecific prominent lower paratracheal nodes measuring up to 8 mm. Limited assessment for hilar adenopathy in the absence of IV contrast. No esophageal wall thickening. No suspicious thyroid nodule. Lungs/Pleura: Spiculated left upper lobe pulmonary nodule measuring 2.3 x 1.7 cm, series 4, image 22. no other pulmonary nodule or mass, allowing for mild motion artifact. Mild emphysema and bronchial thickening. Linear opacities in the right and left lower lobe are likely subsegmental atelectasis. No pleural fluid. Upper Abdomen: Gallstones. No adrenal nodule. No evidence of focal hepatic lesion or metastatic disease in the upper abdomen. Musculoskeletal: No lytic or blastic bone lesion. No acute osseous abnormality. IMPRESSION: 1. Spiculated left upper lobe pulmonary nodule measuring 2.3 x 1.7 cm, highly suspicious for primary bronchogenic malignancy. 2. Prominent subcarinal lymph node measuring 14 mm, nonspecific. Lack of IV contrast limits assessment for hilar adenopathy. 3. Mild emphysema and bronchial thickening. 4. Incidental cholelithiasis in the upper abdomen. Aortic Atherosclerosis (ICD10-I70.0) and Emphysema (ICD10-J43.9). Electronically Signed   By: Keith Rake M.D.   On: 01/21/2020 17:20   MR Brain Wo Contrast (neuro protocol)  Result Date: 01/21/2020 CLINICAL DATA:  Ataxia, stroke suspected. Unsteady gait and confusion since yesterday. EXAM: MRI HEAD WITHOUT CONTRAST TECHNIQUE: Multiplanar, multiecho pulse sequences of the brain and surrounding structures were obtained without intravenous contrast. COMPARISON:  01/21/2020 head CT.  05/11/2016 MRI head. FINDINGS: Please note that motion  artifact limits evaluation. Brain: No focal diffusion-weighted signal abnormality. Mild diffuse parenchymal volume loss with ex vacuo dilatation. No midline shift, mass lesion or extra-axial fluid collection. Scattered T2/FLAIR hyperintense foci involving the periventricular and subcortical white matter are unchanged and nonspecific however commonly associated with chronic microvascular ischemic changes. No intracranial hemorrhage. Vascular: Normal flow voids. Skull and upper cervical spine: Normal marrow signal. Sinuses/Orbits: Normal orbits. Left mastoid effusion. Clear paranasal sinuses. Other: None. IMPRESSION: No acute intracranial process. Mild cerebral atrophy and chronic microvascular ischemic changes. Left mastoid effusion. Please note that motion artifact limits evaluation. Electronically Signed   By: Primitivo Gauze M.D.   On: 01/21/2020 16:52   DG Chest Port 1 View  Result Date: 01/21/2020 CLINICAL DATA:  Altered mental status EXAM: PORTABLE CHEST 1 VIEW COMPARISON:  None. FINDINGS: The heart size and mediastinal contours are within normal limits. Nodular opacity measuring 1.9 cm in projection projects over the left pulmonary apex. The visualized skeletal structures are unremarkable. IMPRESSION: 1.  No acute abnormality of the lungs in AP portable projection. 2. Nodular opacity measuring 1.9 cm in projection projects over the left pulmonary apex. Recommend CT to further evaluate. Electronically Signed   By: Eddie Candle M.D.   On: 01/21/2020 14:47     Microbiology: Recent Results (from the past 240 hour(s))  Culture, Urine     Status: None (Preliminary result)   Collection Time: 01/21/20  5:54 PM   Specimen: Urine, Clean Catch  Result Value Ref Range Status  Specimen Description   Final    URINE, CLEAN CATCH Performed at [facility], [address], [place], [place] [address]    Special Requests   Final    NONE Performed at [facility], [address], [place], [place] [address]     Culture   Final    CULTURE REINCUBATED FOR BETTER GROWTH Performed at [facility], [place] [address] [place], [place] [address]    Report Status PENDING  Incomplete  SARS CORONAVIRUS 2 (Sheritta Deeg 6-24 HRS) Nasopharyngeal Nasopharyngeal Swab     Status: None   Collection Time: 01/21/20  6:12 PM   Specimen: Nasopharyngeal Swab  Result Value Ref Range Status   SARS Coronavirus 2 NEGATIVE NEGATIVE Final    Comment: (NOTE) SARS-CoV-2 target nucleic acids are NOT DETECTED. The SARS-CoV-2 RNA is generally detectable in upper and lower respiratory specimens during the acute phase of infection. Negative results do not preclude SARS-CoV-2 infection, do not rule out co-infections with other pathogens, and should not be used as the sole basis for treatment or other patient management decisions. Negative results must be combined with clinical observations, patient history, and epidemiological information. The expected result is Negative. Fact Sheet for Patients: SugarRoll.be Fact Sheet for Healthcare Providers: https://www.woods-mathews.com/ This test is not yet approved or cleared by the [place] FDA and  has been authorized for detection and/or diagnosis of SARS-CoV-2 by FDA under an Emergency Use Authorization (EUA). This EUA will remain  in effect (meaning this test can be used) for the duration of the COVID-19 declaration under Section 56 4(b)(1) of the Act, 21 U.S.C. section 360bbb-3(b)(1), unless the authorization is terminated or revoked sooner. Performed at [facility], [place] [address], [place], [place] [address]      Labs: Basic Metabolic Panel: Recent Labs  Lab 01/21/20 1431 01/21/20 1431 01/22/20 0601 01/22/20 0807 01/23/20 0604  NA 139  --  141  --  140  K 3.5   < > 3.8  --  3.6  CL 103  --  108  --  105  CO2 26  --  25  --  24  GLUCOSE 107*  --  103*  --  110*  BUN 18  --  16  --  14  CREATININE 1.37*  --  1.25*   --  1.22*  CALCIUM 10.6*  --  9.2  --  8.8*  MG  --   --   --  1.7 2.3   < > = values in this interval not displayed.   Liver Function Tests: Recent Labs  Lab 01/21/20 1431  AST 21  ALT 13  ALKPHOS 107  BILITOT 0.5  PROT 8.1  ALBUMIN 3.1*   Recent Labs  Lab 01/21/20 1431  LIPASE 16   Recent Labs  Lab 01/22/20 0807  AMMONIA <9*   CBC: Recent Labs  Lab 01/21/20 1431 01/22/20 0601 01/23/20 0604  WBC 7.3 7.2 7.0  NEUTROABS 4.2  --   --   HGB 9.2* 9.5* 8.4*  HCT 29.6* 30.7* 27.0*  MCV 85.3 85.3 85.2  PLT 401* 373 344   Cardiac Enzymes: No results for input(s): CKTOTAL, CKMB, CKMBINDEX, TROPONINI in the last 168 hours. BNP: Invalid input(s): POCBNP CBG: Recent Labs  Lab 01/22/20 1159 01/22/20 1606 01/22/20 2134 01/23/20 0731 01/23/20 1122  GLUCAP 181* 185* 86 110* 116*    Time coordinating discharge:  36 minutes  Signed:  Orson Eva, DO [facility] Pager: [phone] 01/23/2020, 4:07 PM

## 2020-01-23 NOTE — Progress Notes (Signed)
IV removed, patient tolerated well. Reviewed AVS with patient's daughter, Otilio Saber, who verbalized understanding.  Patient assisted with dressing and taken to front lobby via wheelchair and transported home by her daughter, Otilio Saber.

## 2020-01-24 LAB — URINE CULTURE: Culture: >=100000 — AB

## 2020-01-24 NOTE — Progress Notes (Signed)
01/21/20 Urine culture returned Aerococcus sp. -I called rx for amoxil 500 mg #15, on po tid, no RF to [facility] -I contacted daughter and notified her of results and to pick up new Rx.  DTat

## 2020-01-25 ENCOUNTER — Other Ambulatory Visit: Payer: Self-pay

## 2020-01-25 ENCOUNTER — Ambulatory Visit: Payer: Medicare HMO | Admitting: Orthotics

## 2020-01-25 DIAGNOSIS — E1169 Type 2 diabetes mellitus with other specified complication: Secondary | ICD-10-CM

## 2020-01-25 DIAGNOSIS — S82843P Displaced bimalleolar fracture of unspecified lower leg, subsequent encounter for closed fracture with malunion: Secondary | ICD-10-CM

## 2020-01-25 DIAGNOSIS — Q666 Other congenital valgus deformities of feet: Secondary | ICD-10-CM

## 2020-01-26 NOTE — Progress Notes (Signed)
Ordered 1 size bigger in l and w to accommodate brace.

## 2020-01-27 DIAGNOSIS — N390 Urinary tract infection, site not specified: Secondary | ICD-10-CM | POA: Diagnosis not present

## 2020-01-27 DIAGNOSIS — R4182 Altered mental status, unspecified: Secondary | ICD-10-CM | POA: Diagnosis not present

## 2020-01-27 DIAGNOSIS — Z6824 Body mass index (BMI) 24.0-24.9, adult: Secondary | ICD-10-CM | POA: Diagnosis not present

## 2020-01-27 DIAGNOSIS — I130 Hypertensive heart and chronic kidney disease with heart failure and stage 1 through stage 4 chronic kidney disease, or unspecified chronic kidney disease: Secondary | ICD-10-CM | POA: Diagnosis not present

## 2020-01-27 DIAGNOSIS — E1140 Type 2 diabetes mellitus with diabetic neuropathy, unspecified: Secondary | ICD-10-CM | POA: Diagnosis not present

## 2020-01-27 DIAGNOSIS — J449 Chronic obstructive pulmonary disease, unspecified: Secondary | ICD-10-CM | POA: Diagnosis not present

## 2020-01-27 DIAGNOSIS — I7025 Atherosclerosis of native arteries of other extremities with ulceration: Secondary | ICD-10-CM | POA: Diagnosis not present

## 2020-01-27 DIAGNOSIS — Z23 Encounter for immunization: Secondary | ICD-10-CM | POA: Diagnosis not present

## 2020-01-27 DIAGNOSIS — Z89431 Acquired absence of right foot: Secondary | ICD-10-CM | POA: Diagnosis not present

## 2020-01-27 DIAGNOSIS — G9341 Metabolic encephalopathy: Secondary | ICD-10-CM | POA: Diagnosis not present

## 2020-01-30 DIAGNOSIS — I129 Hypertensive chronic kidney disease with stage 1 through stage 4 chronic kidney disease, or unspecified chronic kidney disease: Secondary | ICD-10-CM | POA: Diagnosis not present

## 2020-01-30 DIAGNOSIS — E1151 Type 2 diabetes mellitus with diabetic peripheral angiopathy without gangrene: Secondary | ICD-10-CM | POA: Diagnosis not present

## 2020-01-30 DIAGNOSIS — J439 Emphysema, unspecified: Secondary | ICD-10-CM | POA: Diagnosis not present

## 2020-01-30 DIAGNOSIS — G9341 Metabolic encephalopathy: Secondary | ICD-10-CM | POA: Diagnosis not present

## 2020-01-30 DIAGNOSIS — I2510 Atherosclerotic heart disease of native coronary artery without angina pectoris: Secondary | ICD-10-CM | POA: Diagnosis not present

## 2020-01-30 DIAGNOSIS — N1832 Chronic kidney disease, stage 3b: Secondary | ICD-10-CM | POA: Diagnosis not present

## 2020-01-30 DIAGNOSIS — E1122 Type 2 diabetes mellitus with diabetic chronic kidney disease: Secondary | ICD-10-CM | POA: Diagnosis not present

## 2020-01-30 DIAGNOSIS — I700 Atherosclerosis of aorta: Secondary | ICD-10-CM | POA: Diagnosis not present

## 2020-01-30 DIAGNOSIS — R911 Solitary pulmonary nodule: Secondary | ICD-10-CM | POA: Diagnosis not present

## 2020-02-02 DIAGNOSIS — I129 Hypertensive chronic kidney disease with stage 1 through stage 4 chronic kidney disease, or unspecified chronic kidney disease: Secondary | ICD-10-CM | POA: Diagnosis not present

## 2020-02-02 DIAGNOSIS — N1832 Chronic kidney disease, stage 3b: Secondary | ICD-10-CM | POA: Diagnosis not present

## 2020-02-02 DIAGNOSIS — R911 Solitary pulmonary nodule: Secondary | ICD-10-CM | POA: Diagnosis not present

## 2020-02-02 DIAGNOSIS — E1122 Type 2 diabetes mellitus with diabetic chronic kidney disease: Secondary | ICD-10-CM | POA: Diagnosis not present

## 2020-02-02 DIAGNOSIS — E1151 Type 2 diabetes mellitus with diabetic peripheral angiopathy without gangrene: Secondary | ICD-10-CM | POA: Diagnosis not present

## 2020-02-02 DIAGNOSIS — J439 Emphysema, unspecified: Secondary | ICD-10-CM | POA: Diagnosis not present

## 2020-02-02 DIAGNOSIS — I2510 Atherosclerotic heart disease of native coronary artery without angina pectoris: Secondary | ICD-10-CM | POA: Diagnosis not present

## 2020-02-02 DIAGNOSIS — G9341 Metabolic encephalopathy: Secondary | ICD-10-CM | POA: Diagnosis not present

## 2020-02-02 DIAGNOSIS — I700 Atherosclerosis of aorta: Secondary | ICD-10-CM | POA: Diagnosis not present

## 2020-02-04 DIAGNOSIS — N1832 Chronic kidney disease, stage 3b: Secondary | ICD-10-CM | POA: Diagnosis not present

## 2020-02-04 DIAGNOSIS — E1151 Type 2 diabetes mellitus with diabetic peripheral angiopathy without gangrene: Secondary | ICD-10-CM | POA: Diagnosis not present

## 2020-02-04 DIAGNOSIS — G9341 Metabolic encephalopathy: Secondary | ICD-10-CM | POA: Diagnosis not present

## 2020-02-04 DIAGNOSIS — J439 Emphysema, unspecified: Secondary | ICD-10-CM | POA: Diagnosis not present

## 2020-02-04 DIAGNOSIS — I700 Atherosclerosis of aorta: Secondary | ICD-10-CM | POA: Diagnosis not present

## 2020-02-04 DIAGNOSIS — I129 Hypertensive chronic kidney disease with stage 1 through stage 4 chronic kidney disease, or unspecified chronic kidney disease: Secondary | ICD-10-CM | POA: Diagnosis not present

## 2020-02-04 DIAGNOSIS — R911 Solitary pulmonary nodule: Secondary | ICD-10-CM | POA: Diagnosis not present

## 2020-02-04 DIAGNOSIS — I2510 Atherosclerotic heart disease of native coronary artery without angina pectoris: Secondary | ICD-10-CM | POA: Diagnosis not present

## 2020-02-04 DIAGNOSIS — E1122 Type 2 diabetes mellitus with diabetic chronic kidney disease: Secondary | ICD-10-CM | POA: Diagnosis not present

## 2020-02-11 DIAGNOSIS — G9341 Metabolic encephalopathy: Secondary | ICD-10-CM | POA: Diagnosis not present

## 2020-02-11 DIAGNOSIS — J439 Emphysema, unspecified: Secondary | ICD-10-CM | POA: Diagnosis not present

## 2020-02-11 DIAGNOSIS — I129 Hypertensive chronic kidney disease with stage 1 through stage 4 chronic kidney disease, or unspecified chronic kidney disease: Secondary | ICD-10-CM | POA: Diagnosis not present

## 2020-02-11 DIAGNOSIS — R911 Solitary pulmonary nodule: Secondary | ICD-10-CM | POA: Diagnosis not present

## 2020-02-11 DIAGNOSIS — E1151 Type 2 diabetes mellitus with diabetic peripheral angiopathy without gangrene: Secondary | ICD-10-CM | POA: Diagnosis not present

## 2020-02-11 DIAGNOSIS — I700 Atherosclerosis of aorta: Secondary | ICD-10-CM | POA: Diagnosis not present

## 2020-02-11 DIAGNOSIS — I2510 Atherosclerotic heart disease of native coronary artery without angina pectoris: Secondary | ICD-10-CM | POA: Diagnosis not present

## 2020-02-11 DIAGNOSIS — E1122 Type 2 diabetes mellitus with diabetic chronic kidney disease: Secondary | ICD-10-CM | POA: Diagnosis not present

## 2020-02-11 DIAGNOSIS — N1832 Chronic kidney disease, stage 3b: Secondary | ICD-10-CM | POA: Diagnosis not present

## 2020-02-12 DIAGNOSIS — I5032 Chronic diastolic (congestive) heart failure: Secondary | ICD-10-CM | POA: Diagnosis not present

## 2020-02-12 DIAGNOSIS — J449 Chronic obstructive pulmonary disease, unspecified: Secondary | ICD-10-CM | POA: Diagnosis not present

## 2020-02-12 DIAGNOSIS — I130 Hypertensive heart and chronic kidney disease with heart failure and stage 1 through stage 4 chronic kidney disease, or unspecified chronic kidney disease: Secondary | ICD-10-CM | POA: Diagnosis not present

## 2020-02-12 DIAGNOSIS — N183 Chronic kidney disease, stage 3 unspecified: Secondary | ICD-10-CM | POA: Diagnosis not present

## 2020-02-12 DIAGNOSIS — E1122 Type 2 diabetes mellitus with diabetic chronic kidney disease: Secondary | ICD-10-CM | POA: Diagnosis not present

## 2020-02-17 ENCOUNTER — Other Ambulatory Visit: Payer: Medicare HMO | Admitting: Counselor

## 2020-02-22 DIAGNOSIS — I129 Hypertensive chronic kidney disease with stage 1 through stage 4 chronic kidney disease, or unspecified chronic kidney disease: Secondary | ICD-10-CM | POA: Diagnosis not present

## 2020-02-22 DIAGNOSIS — E1151 Type 2 diabetes mellitus with diabetic peripheral angiopathy without gangrene: Secondary | ICD-10-CM | POA: Diagnosis not present

## 2020-02-22 DIAGNOSIS — G9341 Metabolic encephalopathy: Secondary | ICD-10-CM | POA: Diagnosis not present

## 2020-02-22 DIAGNOSIS — R911 Solitary pulmonary nodule: Secondary | ICD-10-CM | POA: Diagnosis not present

## 2020-02-24 ENCOUNTER — Encounter: Payer: Medicare HMO | Admitting: Counselor

## 2020-02-25 ENCOUNTER — Ambulatory Visit: Payer: Medicare HMO | Admitting: Internal Medicine

## 2020-02-25 ENCOUNTER — Other Ambulatory Visit: Payer: Self-pay

## 2020-02-25 ENCOUNTER — Encounter: Payer: Self-pay | Admitting: Internal Medicine

## 2020-02-25 VITALS — BP 90/40 | HR 105 | Temp 98.0°F | Ht 63.0 in | Wt 137.8 lb

## 2020-02-25 DIAGNOSIS — J441 Chronic obstructive pulmonary disease with (acute) exacerbation: Secondary | ICD-10-CM

## 2020-02-25 DIAGNOSIS — E1122 Type 2 diabetes mellitus with diabetic chronic kidney disease: Secondary | ICD-10-CM | POA: Diagnosis not present

## 2020-02-25 DIAGNOSIS — C3412 Malignant neoplasm of upper lobe, left bronchus or lung: Secondary | ICD-10-CM

## 2020-02-25 DIAGNOSIS — I700 Atherosclerosis of aorta: Secondary | ICD-10-CM | POA: Diagnosis not present

## 2020-02-25 DIAGNOSIS — J410 Simple chronic bronchitis: Secondary | ICD-10-CM

## 2020-02-25 DIAGNOSIS — I2510 Atherosclerotic heart disease of native coronary artery without angina pectoris: Secondary | ICD-10-CM | POA: Diagnosis not present

## 2020-02-25 DIAGNOSIS — J439 Emphysema, unspecified: Secondary | ICD-10-CM | POA: Diagnosis not present

## 2020-02-25 DIAGNOSIS — N1832 Chronic kidney disease, stage 3b: Secondary | ICD-10-CM | POA: Diagnosis not present

## 2020-02-25 DIAGNOSIS — I129 Hypertensive chronic kidney disease with stage 1 through stage 4 chronic kidney disease, or unspecified chronic kidney disease: Secondary | ICD-10-CM | POA: Diagnosis not present

## 2020-02-25 DIAGNOSIS — R911 Solitary pulmonary nodule: Secondary | ICD-10-CM | POA: Diagnosis not present

## 2020-02-25 DIAGNOSIS — G9341 Metabolic encephalopathy: Secondary | ICD-10-CM | POA: Diagnosis not present

## 2020-02-25 DIAGNOSIS — E1151 Type 2 diabetes mellitus with diabetic peripheral angiopathy without gangrene: Secondary | ICD-10-CM | POA: Diagnosis not present

## 2020-02-25 MED ORDER — ALBUTEROL SULFATE (2.5 MG/3ML) 0.083% IN NEBU: 2.5000 mg | INHALATION_SOLUTION | Freq: Four times a day (QID) | RESPIRATORY_TRACT | 12 refills | Status: AC | PRN

## 2020-02-25 MED ORDER — VARENICLINE TARTRATE 1 MG PO TABS: 1.0000 mg | ORAL_TABLET | Freq: Two times a day (BID) | ORAL | 5 refills | Status: DC

## 2020-02-25 NOTE — Patient Instructions (Addendum)
The patient should have follow up scheduled with APP in 2 months.   Prior to next visit patient should have: PET scan We will contact you with scheduling Bronchoscopy   Varenicline -- Varenicline (brand name: Chantix) is a prescription medication that works in the brain to reduce nicotine withdrawal symptoms and cigarette cravings. In several studies, it was more effective than placebo (a lookalike substitute that contains no medication.)  It should be taken after eating with a full glass of water as follows: ?One 0.5 mg tablet daily for three days ?One 0.5 mg tablet twice daily for the next four days ?One 1.0 mg tablet twice daily starting at day 7  You should plan to quit smoking between one and four weeks after starting varenicline.   You should continue it for 12 weeks before concluding if it is working; if you successfully quit at 12 weeks, you may continue taking it for an additional 12 weeks. If you have not quit after taking varenicline for 12 weeks, talk to your health care provider about the next step. Options include working harder to make behavioral changes and continuing varenicline or switching to another treatment.  Common side effects of varenicline include nausea and abnormal dreams.  In the very early years of Chantix there were some concerns about Chantix affecting mood and depression.  However, there has been lots of research on this and this is no longer a concern.  Chantix is considered safe to use even in patients taking medication for depression.   In 2011, the [place] Food and Drug Administration (FDA) issued an advisory that, in people who already have heart or blood vessel disease, varenicline may increase the risk of acute heart problems. If there is such a risk, it appears to be small, and is likely outweighed by the benefits of quitting smoking.  Flexible Bronchoscopy  Flexible bronchoscopy is a procedure that is used to examine the passageways in the lungs. During  the procedure, a thin, flexible tool with a camera on it (bronchoscope) is passed into the mouth or nose, down through the windpipe (trachea), and into the air tubes (bronchi) in the lungs. This tool allows your health care provider to look at your lungs from the inside and take testing (diagnostic) samples if needed. Tell a health care provider about:  Any allergies you have.  All medicines you are taking, including vitamins, herbs, eye drops, creams, and over-the-counter medicines.  Any problems you or family members have had with anesthetic medicines.  Any blood disorders you have.  Any surgeries you have had.  Any medical conditions you have.  Whether you are pregnant or may be pregnant. What are the risks? Generally, this is a safe procedure. However, problems may occur, including:  Infection.  Bleeding.  Damage to other structures or organs.  Allergic reactions to medicines.  Collapsed lung (pneumothorax).  Increased need for oxygen or difficulty breathing after the procedure. What happens before the procedure? Medicines Ask your health care provider about:  Changing or stopping your regular medicines. This is especially important if you are taking diabetes medicines or blood thinners.  Taking medicines such as aspirin and ibuprofen. These medicines can thin your blood. Do not take these medicines before your procedure if your health care provider instructs you not to. You may be given antibiotic medicine to help prevent infection. Staying hydrated Follow instructions from your health care provider about hydration, which may include:  Up to 2 hours before the procedure - you may continue  to drink clear liquids, such as water, clear fruit juice, black coffee, and plain tea. Eating and drinking Follow instructions from your health care provider about eating and drinking, which may include:  8 hours before the procedure - stop eating heavy meals or foods such as meat,  fried foods, or fatty foods.  6 hours before the procedure - stop eating light meals or foods, such as toast or cereal.  6 hours before the procedure - stop drinking milk or drinks that contain milk.  2 hours before the procedure - stop drinking clear liquids. General instructions  Plan to have someone take you home from the hospital or clinic.  If you will be going home right after the procedure, plan to have someone with you for 24 hours. What happens during the procedure?  To lower your risk of infection: ? Your health care team will wash or sanitize their hands. ? Your skin will be washed with soap.  An IV tube will be inserted into one of your veins.  You will be given a medicine (local anesthetic) to numb your mouth, nose, throat, and voice box (larynx). You may also be given one or more of the following: ? A medicine to help you relax (sedative). ? A medicine to control coughing. ? A medicine to dry up any fluids in your lungs (secretions).  A bronchoscope will be passed into your nose or mouth, and into your lungs. Your health care provider will examine your lungs.  Samples of airway secretions may be collected for testing.  If abnormal areas are seen in your airways, tissue samples may be removed for examination under a microscope (biopsy).  If tissue samples are needed from the outer parts of the lung, a type of X-ray (fluoroscopy) may be used to guide the bronchoscope to these areas.  If bleeding occurs, you may be given medicine to stop or decrease the bleeding. The procedure may vary among health care providers and hospitals. What happens after the procedure?  Do not drive for 24 hours if you were given a sedative.  Your blood pressure, heart rate, breathing rate, and blood oxygen level will be monitored until the medicines you were given have worn off.  You may have a chest X-ray to check for signs of pneumothorax.  You will not be allowed to eat or drink  anything for 2 hours after your procedure.  If a biopsy was taken, it is up to you to get the results of your procedure. Ask your health care provider, or the department that is doing the procedure, when your results will be ready. Summary  Flexible bronchoscopy is a procedure that allows your health care provider to look closely at your lungs from the inside and take testing (diagnostic) samples if needed.  Risks of flexible bronchoscopy include bleeding, infection, and pneumothorax.  Before a flexible bronchoscopy, you will be given a medicine (local anesthetic) to numb your mouth, nose, throat, and voice box (larynx). Then, a bronchoscope will be passed into your nose or mouth, and into your lungs.  After the procedure, your blood pressure, heart rate, breathing rate, and blood oxygen level will be monitored until the medicines you were given have worn off. You may have a chest X-ray to check for signs of pneumothorax.  You will not be allowed to eat or drink anything for 2 hours after your procedure. This information is not intended to replace advice given to you by your health care provider. Make sure you  discuss any questions you have with your health care provider. Document Revised: 09/13/2017 Document Reviewed: 11/03/2016 Elsevier Patient Education  2020 [employer].

## 2020-02-25 NOTE — Progress Notes (Signed)
Georgeana Oertel    [id number]    Sep 12, 1946  Primary Care Physician:Fusco, Purcell Nails, MD  Referring Physician: Redmond [employer], [address] [place],  [place] [address] Reason for Consultation: spot on lung Date of Consultation: 02/25/2020  Chief complaint:   Chief Complaint  Patient presents with  . Consult    spot on lung per CT scan     HPI: Sherri Brooks is a 74 y.o. woman with history of tobacco use disorder who presents for new patient evaluation for lung nodule.  She has a history of tobacco use disorder is a current everyday smoker at 1 pack/day.  She has quit once in the past for 3 months but then resumed due to her nurse.  She has no formal history of COPD but does have type 2 diabetes and peripheral arterial disease and has had lower extremity amputations.  She has had stents placed in her peripheral arteries including an aorto femoral bypass as well as cardiac catheterizations.  She is here with her daughter today.  Daughter notes that she has had decreased appetite and weight loss that is unintentional.  She is coughing more now.  She had an ED visit for confusion which was felt to be secondary to UTI and dehydration.  Part of her work-up included a chest x-ray which was concerning for a lung nodule.  She had a CT study showed a left upper lobe spiculated mass and lymphadenopathy concerning for primary lung malignancy.  While she has been having coughing and sneezing she does not have any hemoptysis.  She does not take any breathing treatments currently but has been given them in the past and they have been helpful.  Upon hearing that her lung mass could be concerning for malignancy she expresses a desire to quit smoking.  Social History   Occupational History  . Occupation: retired; Biochemist, clinical: RETIRED  Tobacco Use  . Smoking status: Current Every Day Smoker    Packs/day: 1.00    Years: 30.00    Pack years: 30.00    Types: Cigarettes     Start date: 11/04/2012  . Smokeless tobacco: Never Used  . Tobacco comment: trying to cut back, 1/2 a pack 02/25/20  Substance and Sexual Activity  . Alcohol use: No    Alcohol/week: 0.0 standard drinks  . Drug use: No  . Sexual activity: Not on file    Relevant family history:  Family History  Problem Relation Age of Onset  . Brain cancer Father   . Cancer Father   . Diabetes Mother        many family members  . Alzheimer's disease Mother   . Heart disease Other     Past Medical History:  Diagnosis Date  . Arthritis   . Asthma   . CAD (coronary artery disease)   . Cellulitis of buttock, left 2010  . Diabetes mellitus   . GERD (gastroesophageal reflux disease)   . Hyperlipidemia   . Hypertension   . MI (myocardial infarction) ([place])   . PVD (peripheral vascular disease) ([place])   . Wears glasses     Past Surgical History:  Procedure Laterality Date  . ABDOMINAL AORTAGRAM N/A 07/28/2012   Procedure: ABDOMINAL Maxcine Ham;  Surgeon: Angelia Mould, MD;  Location: [facility] CATH LAB;  Service: Cardiovascular;  Laterality: N/A;  . ABDOMINAL AORTOGRAM W/LOWER EXTREMITY N/A 06/10/2017   Procedure: ABDOMINAL AORTOGRAM W/LOWER EXTREMITY;  Surgeon: Angelia Mould,  MD;  Location: [facility] CV LAB;  Service: Cardiovascular;  Laterality: N/A;  . COLONOSCOPY  10/26/2011   Procedure: COLONOSCOPY;  Surgeon: Dorothyann Peng, MD;  [facility];  Service: Endoscopy;  Laterality: N/A;  1:30  . CORONARY STENT INTERVENTION N/A 06/14/2017   Procedure: CORONARY STENT INTERVENTION;  Surgeon: Leonie Man, MD;  Location: [facility] CV LAB;  Service: Cardiovascular;  Laterality: N/A;  . FEMORAL-POPLITEAL BYPASS GRAFT  07/2010   pt has had 3 fem-pop bpg  . FEMORAL-POPLITEAL BYPASS GRAFT  11/04/2012   Procedure: BYPASS GRAFT FEMORAL-POPLITEAL ARTERY;  Surgeon: Angelia Mould, MD;  Location: [facility] OR;  Service: Vascular;  Laterality: Left;  Redo Left Femoral Popliteal Bypass  with PTFE  . FOOT AMPUTATION  2010   left  . LEFT HEART CATH AND CORONARY ANGIOGRAPHY N/A 06/14/2017   Procedure: LEFT HEART CATH AND CORONARY ANGIOGRAPHY;  Surgeon: Leonie Man, MD;  Location: [facility] CV LAB;  Service: Cardiovascular;  Laterality: N/A;  . LOWER EXTREMITY ANGIOGRAM Bilateral 07/28/2012   Procedure: LOWER EXTREMITY ANGIOGRAM;  Surgeon: Angelia Mould, MD;  Location: [facility] CATH LAB;  Service: Cardiovascular;  Laterality: Bilateral;  . LOWER EXTREMITY ANGIOGRAM Left 01/02/2016   Procedure: Lower Extremity Angiogram;  Surgeon: Angelia Mould, MD;  Location: [facility] CV LAB;  Service: Cardiovascular;  Laterality: Left;  . LOWER EXTREMITY ANGIOGRAM Left 07/03/2017   Procedure: Lower Extremity Angiogram;  Surgeon: Waynetta Sandy, MD;  Location: [place] CV LAB;  Service: Cardiovascular;  Laterality: Left;  . LOWER EXTREMITY INTERVENTION Left 07/03/2017   Procedure: Lower Extremity Intervention;  Surgeon: Waynetta Sandy, MD;  Location: [place] CV LAB;  Service: Cardiovascular;  Laterality: Left;  . ORIF ANKLE FRACTURE Left 01/27/2013   Procedure: OPEN REDUCTION INTERNAL FIXATION (ORIF) ANKLE FRACTURE;  Surgeon: Wylene Simmer, MD;  Location: [facility];  Service: Orthopedics;  Laterality: Left;  . PERCUTANEOUS STENT INTERVENTION Left 07/28/2012   Procedure: PERCUTANEOUS STENT INTERVENTION;  Surgeon: Angelia Mould, MD;  Location: [facility] CATH LAB;  Service: Cardiovascular;  Laterality: Left;  lt ext tiliac stentx1  . PERIPHERAL VASCULAR BALLOON ANGIOPLASTY Left 06/10/2017   Procedure: PERIPHERAL VASCULAR BALLOON ANGIOPLASTY;  Surgeon: Angelia Mould, MD;  Location: [facility] CV LAB;  Service: Cardiovascular;  Laterality: Left;  external iliac  . PERIPHERAL VASCULAR CATHETERIZATION N/A 01/02/2016   Procedure: Abdominal Aortogram;  Surgeon: Angelia Mould, MD;  Location: [facility] CV LAB;  Service:  Cardiovascular;  Laterality: N/A;  . PERIPHERAL VASCULAR CATHETERIZATION Left 01/02/2016   Procedure: Peripheral Vascular Intervention;  Surgeon: Angelia Mould, MD;  Location: [place] CV LAB;  Service: Cardiovascular;  Laterality: Left;  lt ext iliac  . right common iliac stent  10/2009   Dr Doren Custard ([place])  . S/P Hysterecotmy     partial  . TONSILLECTOMY       Physical Exam: Blood pressure (!) 90/40, pulse (!) 105, temperature 98 F (36.7 C), temperature source Temporal, height 5\' 3"  (1.6 m), weight 137 lb 12.8 oz (62.5 kg), SpO2 100 %. Gen:      No acute distress, thin ENT:  no nasal polyps, mucus membranes moist Lungs:    No increased respiratory effort, symmetric chest wall excursion, diminished breath sounds, clear to auscultation bilaterally, no wheezes or crackles CV:         Regular rate and rhythm; no murmurs, rubs, or gallops.  No pedal edema Abd:      + bowel  sounds; soft, non-tender; no distension MSK: no acute synovitis of DIP or PIP joints, no mechanics hands.  Skin:      Warm and dry; no rashes Neuro: normal speech, no focal facial asymmetry Psych: alert and oriented x3, normal mood and affect   Data Reviewed/Medical Decision Making:  Independent interpretation of tests: Imaging: . Review of patient's CT chest January 21, 2020 images revealed left upper lobe spiculated nodule greater than 2 cm in size as well as an enlarged subcarinal lymph node.  This is concerning for primary lung malignancy. The patient's images have been independently reviewed by me.    PFTs: None on file  Labs:  Lab Results  Component Value Date   WBC 7.0 01/23/2020   HGB 8.4 (L) 01/23/2020   HCT 27.0 (L) 01/23/2020   MCV 85.2 01/23/2020   PLT 344 01/23/2020   Lab Results  Component Value Date   NA 140 01/23/2020   K 3.6 01/23/2020   CL 105 01/23/2020   CO2 24 01/23/2020     Immunization status:  Immunization History  Administered Date(s) Administered  . Influenza, High  Dose Seasonal PF 06/28/2019  . Tdap 01/16/2019    . I reviewed prior external note(s) from ED visit and hospitalization . I reviewed the result(s) of the labs and imaging as noted above.  . I have ordered PET scan  Discussion of management or test interpretation with another colleague - yes regarding decision for bronchoscopy  Assessment:  Left upper lobe lung mass, concerning for primary lung malignancy Mediastinal lymphadenopathy Tobacco use disorder  Plan/Recommendations:  We will order PET scan today.  Discussed bronchoscopy is the most likely diagnostic.  I suspect she will need bronchoscopy with endobronchial ultrasound, plus or minus navigational bronchoscopy.  I personally spent 7 minutes counseling the patient regarding tobacco use disorder.  Patient is symptomatic from tobacco use disorder due to the following condition: COPD, lung cancer.  The patient's response was contemplative.  We discussed nicotine replacement therapy, Wellbutrin, Chantix.  We identified to gather patient specific barriers to change.  The patient is open to future discussions about tobacco cessation.  She would like to try Chantix today for smoking cessation.  Since she has benefited from albuterol inhaler therapy in the past we will get her nebulizer machine at home as well as albuterol as needed.  We discussed disease management and progression at length today.   I spent 65 minutes in the care of this patient today including pre-charting, chart review, review of results, face-to-face care, coordination of care and communication with consultants etc.).  Return to Care: Return in about 2 months (around 04/26/2020).  I will have her follow-up with one of our APPs regarding smoking cessation and COPD.  She will be contacted with the results of PET scan and next steps on bronchoscopy.  Lenice Llamas, MD [facility] [place]  CC: [employer]mond [employer],  MD

## 2020-02-26 DIAGNOSIS — J439 Emphysema, unspecified: Secondary | ICD-10-CM | POA: Diagnosis not present

## 2020-02-26 DIAGNOSIS — I2510 Atherosclerotic heart disease of native coronary artery without angina pectoris: Secondary | ICD-10-CM | POA: Diagnosis not present

## 2020-02-26 DIAGNOSIS — E1122 Type 2 diabetes mellitus with diabetic chronic kidney disease: Secondary | ICD-10-CM | POA: Diagnosis not present

## 2020-02-26 DIAGNOSIS — G9341 Metabolic encephalopathy: Secondary | ICD-10-CM | POA: Diagnosis not present

## 2020-02-26 DIAGNOSIS — E1151 Type 2 diabetes mellitus with diabetic peripheral angiopathy without gangrene: Secondary | ICD-10-CM | POA: Diagnosis not present

## 2020-02-26 DIAGNOSIS — I129 Hypertensive chronic kidney disease with stage 1 through stage 4 chronic kidney disease, or unspecified chronic kidney disease: Secondary | ICD-10-CM | POA: Diagnosis not present

## 2020-02-26 DIAGNOSIS — I700 Atherosclerosis of aorta: Secondary | ICD-10-CM | POA: Diagnosis not present

## 2020-02-26 DIAGNOSIS — R911 Solitary pulmonary nodule: Secondary | ICD-10-CM | POA: Diagnosis not present

## 2020-02-26 DIAGNOSIS — N1832 Chronic kidney disease, stage 3b: Secondary | ICD-10-CM | POA: Diagnosis not present

## 2020-02-29 DIAGNOSIS — I700 Atherosclerosis of aorta: Secondary | ICD-10-CM | POA: Diagnosis not present

## 2020-02-29 DIAGNOSIS — I129 Hypertensive chronic kidney disease with stage 1 through stage 4 chronic kidney disease, or unspecified chronic kidney disease: Secondary | ICD-10-CM | POA: Diagnosis not present

## 2020-02-29 DIAGNOSIS — E1122 Type 2 diabetes mellitus with diabetic chronic kidney disease: Secondary | ICD-10-CM | POA: Diagnosis not present

## 2020-02-29 DIAGNOSIS — R911 Solitary pulmonary nodule: Secondary | ICD-10-CM | POA: Diagnosis not present

## 2020-02-29 DIAGNOSIS — I2510 Atherosclerotic heart disease of native coronary artery without angina pectoris: Secondary | ICD-10-CM | POA: Diagnosis not present

## 2020-02-29 DIAGNOSIS — E1151 Type 2 diabetes mellitus with diabetic peripheral angiopathy without gangrene: Secondary | ICD-10-CM | POA: Diagnosis not present

## 2020-02-29 DIAGNOSIS — J439 Emphysema, unspecified: Secondary | ICD-10-CM | POA: Diagnosis not present

## 2020-02-29 DIAGNOSIS — N1832 Chronic kidney disease, stage 3b: Secondary | ICD-10-CM | POA: Diagnosis not present

## 2020-02-29 DIAGNOSIS — G9341 Metabolic encephalopathy: Secondary | ICD-10-CM | POA: Diagnosis not present

## 2020-03-02 DIAGNOSIS — I700 Atherosclerosis of aorta: Secondary | ICD-10-CM | POA: Diagnosis not present

## 2020-03-02 DIAGNOSIS — J439 Emphysema, unspecified: Secondary | ICD-10-CM | POA: Diagnosis not present

## 2020-03-02 DIAGNOSIS — G9341 Metabolic encephalopathy: Secondary | ICD-10-CM | POA: Diagnosis not present

## 2020-03-02 DIAGNOSIS — R911 Solitary pulmonary nodule: Secondary | ICD-10-CM | POA: Diagnosis not present

## 2020-03-02 DIAGNOSIS — N1832 Chronic kidney disease, stage 3b: Secondary | ICD-10-CM | POA: Diagnosis not present

## 2020-03-02 DIAGNOSIS — E1122 Type 2 diabetes mellitus with diabetic chronic kidney disease: Secondary | ICD-10-CM | POA: Diagnosis not present

## 2020-03-02 DIAGNOSIS — E1151 Type 2 diabetes mellitus with diabetic peripheral angiopathy without gangrene: Secondary | ICD-10-CM | POA: Diagnosis not present

## 2020-03-02 DIAGNOSIS — I2510 Atherosclerotic heart disease of native coronary artery without angina pectoris: Secondary | ICD-10-CM | POA: Diagnosis not present

## 2020-03-02 DIAGNOSIS — I129 Hypertensive chronic kidney disease with stage 1 through stage 4 chronic kidney disease, or unspecified chronic kidney disease: Secondary | ICD-10-CM | POA: Diagnosis not present

## 2020-03-03 DIAGNOSIS — J439 Emphysema, unspecified: Secondary | ICD-10-CM | POA: Diagnosis not present

## 2020-03-03 DIAGNOSIS — G9341 Metabolic encephalopathy: Secondary | ICD-10-CM | POA: Diagnosis not present

## 2020-03-03 DIAGNOSIS — E1151 Type 2 diabetes mellitus with diabetic peripheral angiopathy without gangrene: Secondary | ICD-10-CM | POA: Diagnosis not present

## 2020-03-03 DIAGNOSIS — R911 Solitary pulmonary nodule: Secondary | ICD-10-CM | POA: Diagnosis not present

## 2020-03-03 DIAGNOSIS — N1832 Chronic kidney disease, stage 3b: Secondary | ICD-10-CM | POA: Diagnosis not present

## 2020-03-03 DIAGNOSIS — I700 Atherosclerosis of aorta: Secondary | ICD-10-CM | POA: Diagnosis not present

## 2020-03-03 DIAGNOSIS — I129 Hypertensive chronic kidney disease with stage 1 through stage 4 chronic kidney disease, or unspecified chronic kidney disease: Secondary | ICD-10-CM | POA: Diagnosis not present

## 2020-03-03 DIAGNOSIS — I2510 Atherosclerotic heart disease of native coronary artery without angina pectoris: Secondary | ICD-10-CM | POA: Diagnosis not present

## 2020-03-03 DIAGNOSIS — E1122 Type 2 diabetes mellitus with diabetic chronic kidney disease: Secondary | ICD-10-CM | POA: Diagnosis not present

## 2020-03-07 ENCOUNTER — Other Ambulatory Visit: Payer: Self-pay

## 2020-03-07 ENCOUNTER — Ambulatory Visit (HOSPITAL_COMMUNITY)
Admission: RE | Admit: 2020-03-07 | Discharge: 2020-03-07 | Disposition: A | Discharge status: Home / Self Care | Payer: Medicare HMO | Source: Ambulatory Visit | Attending: Internal Medicine | Admitting: Internal Medicine

## 2020-03-07 DIAGNOSIS — C3412 Malignant neoplasm of upper lobe, left bronchus or lung: Secondary | ICD-10-CM | POA: Diagnosis not present

## 2020-03-07 DIAGNOSIS — R911 Solitary pulmonary nodule: Secondary | ICD-10-CM | POA: Diagnosis not present

## 2020-03-07 MED ORDER — FLUDEOXYGLUCOSE F - 18 (FDG) INJECTION
8.2300 | Freq: Once | INTRAVENOUS | Status: AC | PRN
  Administered 2020-03-07: 8.23 via INTRAVENOUS

## 2020-03-08 ENCOUNTER — Telehealth: Payer: Self-pay | Admitting: Internal Medicine

## 2020-03-08 NOTE — Telephone Encounter (Signed)
I have faxed the order over to [facility] again

## 2020-03-08 NOTE — Telephone Encounter (Signed)
We will need an order for this thanks libby

## 2020-03-09 ENCOUNTER — Other Ambulatory Visit: Payer: Self-pay

## 2020-03-09 ENCOUNTER — Encounter: Payer: Self-pay | Admitting: Counselor

## 2020-03-09 ENCOUNTER — Encounter (HOSPITAL_COMMUNITY): Payer: Self-pay | Admitting: Radiology

## 2020-03-09 ENCOUNTER — Ambulatory Visit (INDEPENDENT_AMBULATORY_CARE_PROVIDER_SITE_OTHER): Payer: Medicare HMO | Admitting: Counselor

## 2020-03-09 ENCOUNTER — Telehealth: Payer: Self-pay | Admitting: Internal Medicine

## 2020-03-09 ENCOUNTER — Ambulatory Visit: Payer: Medicare HMO | Admitting: Psychology

## 2020-03-09 DIAGNOSIS — F0390 Unspecified dementia without behavioral disturbance: Secondary | ICD-10-CM | POA: Diagnosis not present

## 2020-03-09 DIAGNOSIS — R441 Visual hallucinations: Secondary | ICD-10-CM

## 2020-03-09 DIAGNOSIS — F09 Unspecified mental disorder due to known physiological condition: Secondary | ICD-10-CM

## 2020-03-09 DIAGNOSIS — K118 Other diseases of salivary glands: Secondary | ICD-10-CM

## 2020-03-09 DIAGNOSIS — R918 Other nonspecific abnormal finding of lung field: Secondary | ICD-10-CM

## 2020-03-09 DIAGNOSIS — Z87898 Personal history of other specified conditions: Secondary | ICD-10-CM

## 2020-03-09 NOTE — Progress Notes (Signed)
   Psychometrist Note   Cognitive testing was administered to [facility] by Milana Kidney, B.S. (Technician) under the supervision of Alphonzo Severance, Psy.D., ABN. Ms. Dix was able to tolerate all test procedures. Dr. Nicole Kindred met with the patient as needed to manage any emotional reactions to the testing procedures (if applicable). Rest breaks were offered.    The battery of tests administered was selected by Dr. Nicole Kindred with consideration to the patient's current level of functioning, the nature of her symptoms, emotional and behavioral responses during the interview, level of literacy, observed level of motivation/effort, and the nature of the referral question. This battery was communicated to the psychometrist. Communication between Dr. Nicole Kindred and the psychometrist was ongoing throughout the evaluation and Dr. Nicole Kindred was immediately accessible at all times. Dr. Nicole Kindred provided supervision to the technician on the date of this service, to the extent necessary to assure the quality of all services provided.    Ms. Cavey will return in approximately one week for an interactive feedback session with Dr. Nicole Kindred, at which time female test performance, clinical impressions, and treatment recommendations will be reviewed in detail. The patient understands she can contact our office should she require our assistance before this time.   A total of 85 minutes of billable time were spent with Valentino Nose by the technician, including test administration and scoring time. Billing for these services is reflected in Dr. Les Pou note.   This note reflects time spent with the psychometrician and does not include test scores, clinical history, or any interpretations made by Dr. Nicole Kindred. The full report will follow in a separate note.

## 2020-03-09 NOTE — Progress Notes (Signed)
[place] Female, 74 y.o., November 12, 1945 MRN:  [id number] Phone:  [phone] (H) PCP:  Redmond School, MD Coverage:  [employer] Medicare/[employer] Medicare Hmo Next Appt With Neurology 03/16/2020 at 2:30 PM  RE: CT Biopsy Received: Today Message Contents  Markus Daft, MD  Arlyn Leak for CT guided biopsy of left upper lobe lesion.   [place]       Previous Messages   ----- Message -----  From: Garth Bigness D  Sent: 03/09/2020  3:02 PM EDT  To: [facility] Procedure Requests  Subject: CT Biopsy                     Procedure:  CT Biopsy   Reason: lung mass concerning for cancer   History: NM PET, CT in computer   Provider: Spero Geralds   Provider Contact: [phone]

## 2020-03-09 NOTE — Telephone Encounter (Signed)
Called patient to discuss results of CT scan. There is a mass in the lung which needs CT guided biopsy since no pet avid lymphadenopathy. Have ordered this today. There is also pet avidity in the parotid glands which is concerning for malignancy - referral to ENT placed to get this scheduled for biopsy as well.  Patient does not need bronchoscopy at this time as we had previously discussed. Spoke with patient and daughter about these findings - they will await call from our staff regarding referral and biopsy.
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
## 2020-03-10 ENCOUNTER — Encounter (HOSPITAL_COMMUNITY): Payer: Self-pay | Admitting: Radiology

## 2020-03-10 DIAGNOSIS — I2510 Atherosclerotic heart disease of native coronary artery without angina pectoris: Secondary | ICD-10-CM | POA: Diagnosis not present

## 2020-03-10 DIAGNOSIS — I129 Hypertensive chronic kidney disease with stage 1 through stage 4 chronic kidney disease, or unspecified chronic kidney disease: Secondary | ICD-10-CM | POA: Diagnosis not present

## 2020-03-10 DIAGNOSIS — R911 Solitary pulmonary nodule: Secondary | ICD-10-CM | POA: Diagnosis not present

## 2020-03-10 DIAGNOSIS — G9341 Metabolic encephalopathy: Secondary | ICD-10-CM | POA: Diagnosis not present

## 2020-03-10 DIAGNOSIS — E1122 Type 2 diabetes mellitus with diabetic chronic kidney disease: Secondary | ICD-10-CM | POA: Diagnosis not present

## 2020-03-10 DIAGNOSIS — E1151 Type 2 diabetes mellitus with diabetic peripheral angiopathy without gangrene: Secondary | ICD-10-CM | POA: Diagnosis not present

## 2020-03-10 DIAGNOSIS — N1832 Chronic kidney disease, stage 3b: Secondary | ICD-10-CM | POA: Diagnosis not present

## 2020-03-10 DIAGNOSIS — J439 Emphysema, unspecified: Secondary | ICD-10-CM | POA: Diagnosis not present

## 2020-03-10 DIAGNOSIS — I700 Atherosclerosis of aorta: Secondary | ICD-10-CM | POA: Diagnosis not present

## 2020-03-10 NOTE — Progress Notes (Signed)
[facility] Female, 74 y.o., 07-May-1946 MRN:  [id number] Phone:  [phone] (H) PCP:  Redmond School, MD Coverage:  [facility] Medicare/[employer] Medicare Hmo Next Appt With Neurology 03/16/2020 at 2:30 PM  RE: CT Biopsy Received: Yesterday Message Contents  Spero Geralds, MD  Garth Bigness D  Yes that should be fine for procedure thanks       Previous Messages   ----- Message -----  From: Garth Bigness D  Sent: 03/09/2020  3:05 PM EDT  To: Spero Geralds, MD  Subject: FW: CT Biopsy                   Patient is on Plavix and will need to hold for 5 days prior to biopsy. Is it okay to hold patients Plavix for 5 days prior to her Biopsy? Thanks Aniceto Boss  ----- Message -----  From: Garth Bigness D  Sent: 03/09/2020  3:02 PM EDT  To: [facility] Procedure Requests  Subject: CT Biopsy                     Procedure:  CT Biopsy   Reason: lung mass concerning for cancer   History: NM PET, CT in computer   Provider: Spero Geralds   Provider Contact: [phone]

## 2020-03-11 NOTE — Progress Notes (Signed)
[facility]  Patient Name: Vianne Grieshop MRN: [id number] Date of Birth: 1945/11/30 Age: 74 y.o. Education: 12 years  Measurement properties of test scores: IQ, Index, and Standard Scores (SS): Mean = 100; Standard Deviation = 15 Scaled Scores (Ss): Mean = 10; Standard Deviation = 3 Z scores (Z): Mean = 0; Standard Deviation = 1 T scores (T); Mean = 50; Standard Deviation = 10  TEST SCORES:    Note: This summary of test scores accompanies the interpretive report and should not be interpreted by unqualified individuals or in isolation without reference to the report. Test scores are relative to age, gender, and educational history as available and appropriate.   Mental Status Screening     Total Score Descriptor  MMSE "World" 16 Moderate Dementia  MMSE Serial 7s 15 Moderate Dementia      Expected Functioning        Wide Range Achievement Test: Standard/Scaled Score Percentile      Word Reading 94 34      Attention/Processing Speed        Neuropsychological Assessment Battery (Attention Module, Form 1): T-score Percentile      Digits Forward 69 97      Digits Backwards 52 58      Repeatable Battery for the Assessment of Neuropsychological Status (Form A): Scaled Score Percentile      Coding 1 <1      Language        Neuropsychological Assessment Battery (Language Module, Form 1): T-score Percentile      Naming   (24) 32 4      Verbal Fluency: T-score Percentile      Controlled Oral Word Association (F-A-S) 42 21      Semantic Fluency (Animals) 42 21      Memory:        Neuropsychological Assessment Battery (Memory Module, Form 1): T-score Percentile      List Learning           List A Immediate Recall   (0, 3, 3) 19 <1         List B Immediate Recall   (2) 40 16         List A Short Delayed Recall   (0) 23 <1         List A Long Delayed Recall   (0) 30 2         List A Long Delayed Yes/No Recognition Hits   (6) --- <1   List A Long Delayed Yes/No Recognition False Alarms   (3) --- 73         List A Recognition Discriminability Index --- 21      Repeatable Battery for the Assessment of Neuropsychological Status (Form A): Scaled Score Percentile         Figure Recall   (0) 1 <1      Visuospatial/Constructional Functioning        Repeatable Battery for the Assessment of Neuropsychological Status (Form A): Standard/Scaled Score Percentile     Visuospatial/Constructional Index 60 <1         Figure Copy   (13) 4 2         Judgment of Line Orientation   (6) --- <2      Executive Functioning        Trail Making Test: T-Score Percentile      Part A 28 2      Part B Discontinued Discontinued  [place] Diagnostic Aphasia Exam: Raw Score Scaled Score      Complex Ideational Material 6 2      Clock Drawing Raw Score Descriptor      Command 3 Severe Impairment      Rating Scales         Raw Score Descriptor  Geriatric Depression Scale - Short Form 6 Depressed    Romelle Reiley V. Nicole Kindred PsyD, [place] Clinical Neuropsychologist

## 2020-03-14 DIAGNOSIS — I130 Hypertensive heart and chronic kidney disease with heart failure and stage 1 through stage 4 chronic kidney disease, or unspecified chronic kidney disease: Secondary | ICD-10-CM | POA: Diagnosis not present

## 2020-03-14 DIAGNOSIS — E1122 Type 2 diabetes mellitus with diabetic chronic kidney disease: Secondary | ICD-10-CM | POA: Diagnosis not present

## 2020-03-14 DIAGNOSIS — I5032 Chronic diastolic (congestive) heart failure: Secondary | ICD-10-CM | POA: Diagnosis not present

## 2020-03-14 DIAGNOSIS — Z89431 Acquired absence of right foot: Secondary | ICD-10-CM | POA: Diagnosis not present

## 2020-03-15 NOTE — Progress Notes (Signed)
[facility]  Patient Name: Sherri Brooks MRN: [id number] Date of Birth: Jun 16, 1946 Age: 74 y.o. Education: 12 years  Clinical Impressions and Recommendations  Sherri Brooks is a 74 y.o., right-hand dominant, widowed woman who was recently in the hospital (01/21/2020 - 04/10/202) with altered mental status presumably due to insufficient PO intake and a UTI. She and her daughter were limited historians but her daughter reported that she was getting confused and thought she was talking with her mother who has been dead for some time, she mistook her friend for her father, and she may be having formed visual hallucinations of people. There is claims data in our system going back to September, 2020 with principal diagnoses of unspecified disorientation and hallucinations, suggesting that these issues predate her recent episode of AMS. She relinquished most instrumental activities of daily living about 3 years ago, it sounds like.   Sherri Brooks performance on neuropsychological testing was consistent with significant cognitive impairment including memory storage problems, naming deficits, and low scores on most measures of executive function visuospatial/constructional functioning, and processing speed. She did much better on measures of timed verbal fluency and on digit repetition. She screened positive for the presence of depression.   Sherri Brooks presentation is highly concerning for a dementia syndrome, although she is likely presenting somewhat more severely on testing than she would at "baseline" given that she was just in the hospital. Differential favors a mixed dementia (Alzheimer's and Lewy Body Disease), although it is possible that she simply has advanced Alzheimer's. She would benefit from a visit with neurology to assess for Parkinsonism. Would recommend minimizing the use of brain impairing medications (e.g., Lorazepam) to the extent possible. She  is not capable of independently managing her insulin regimen, medications, or making medical or financial decisions of high risk. Appropriate care for individuals at this level of impairment includes settings with essentially 24 hour supervision, such as home with significant assistance from family members or paid supports or skilled nursing.   Diagnostic Impressions: Mixed Alzheimer's and Lewy Body Dementia   Test Findings  Test scores are summarized in additional documentation associated with this encounter. Test scores are relative to age, gender, and educational history as available and appropriate. There were no concerns about performance validity as all findings fell within normal expectations.   General Intellectual Functioning/Achievement:  Performance was average on single word reading, which presents as a reasonable standard of comparison for Sherri Brooks cognitive test performance.   Attention and Processing Efficiency: Performance was very good on indicators of attention with superior digit repetition forward compared to her age and education matched cohort and average digit repetition backward. Performance on measures of processing efficiency was low, with extremely low scores on timed number-symbol coding and on simple numeric sequencing.   Language: Performance was variable on language measures. Visual object confrontation naming was impaired, whereas performance on timed measures of verbal fluency was better, falling in the low average range both in response to letter prompts and in response to the category prompt "animals."   Visuospatial Function: Visuospatial and constructional functioning was impaired, with an extremely low score on the overall index and comparable extremely low scores on both constructional and perceptual tasks.   Learning and Memory: Performance on learning and memory measures suggested advanced storage problems affecting storage of both visual and verbal  information.   In the verbal realm, Sherri Brooks learned 0, 3, and 3 words of a 12-item supra span word list across  three repetitions but then did not recall any of them on immediate or delayed recall, which is impaired. Her recognition discriminability for words from the list versus foils was a bit better and fell in the low average range, suggesting a bit of residual capacity.   In the visual realm, delayed recall for a modestly complex figure was extremely low (she did not recall any of the figure).   Executive Functions: Performance on executive measures was generally low. Clock drawing was consistent with "severe impairment," with poor representation of the numbers and only one hand. Alternating sequencing of numbers and letters of the alphabet was discontinued, because she couldn't make it through the sample item, suggesting great difficulty with the task. Reasoning with verbal information was extremely low on the Complex Ideational Material. By contrast, she performed in the low average range when generating words in response to the letters F-A-S.   Rating Scale(s): Sherri Brooks screened positive for the presence of depression, although many of the positive items may reflect her current cognitive and physical disabilities (e.g., preferring not to leave the house, giving up many of her activities and interests).   Viviano Simas Nicole Kindred PsyD, [place] Clinical Neuropsychologist

## 2020-03-16 ENCOUNTER — Encounter: Payer: Self-pay | Admitting: Counselor

## 2020-03-16 ENCOUNTER — Other Ambulatory Visit: Payer: Self-pay

## 2020-03-16 ENCOUNTER — Ambulatory Visit (INDEPENDENT_AMBULATORY_CARE_PROVIDER_SITE_OTHER): Payer: Medicare HMO | Admitting: Counselor

## 2020-03-16 DIAGNOSIS — F0150 Vascular dementia without behavioral disturbance: Secondary | ICD-10-CM

## 2020-03-16 DIAGNOSIS — G309 Alzheimer's disease, unspecified: Secondary | ICD-10-CM | POA: Diagnosis not present

## 2020-03-16 DIAGNOSIS — G3184 Mild cognitive impairment, so stated: Secondary | ICD-10-CM | POA: Diagnosis not present

## 2020-03-16 DIAGNOSIS — F0280 Dementia in other diseases classified elsewhere without behavioral disturbance: Secondary | ICD-10-CM | POA: Diagnosis not present

## 2020-03-16 NOTE — Progress Notes (Signed)
[facility]  I met with Sherri Brooks daughter Sherri Brooks to review the findings resulting from her neuropsychological evaluation. She was not available at the time of the appointment, although her ability to participate in the appointment is also limited. Since the last appointment, she has been about the same. She has an upcoming biopsy for her lung mass. Time was spent reviewing the impressions and recommendations that are detailed in the evaluation report. We discussed impression of acute on chronic cognitive impairment, and that she likely has a neurodegenerative dementia due to Alzheimer's disease with more minor Lewy Body and vascular components. I reviewed with her the importance of managing the patient's underlying health conditions, providing her with optimal nutrition, and allowing her to return to equilibrium. We also discussed referral to neurology to check for Parkinsonism, which she was interested in. I think that workup beyond that to prove primary parkinsonism is likely to be low yield, because it would not alter prognosis. I took time to explain the findings and answer all the patient's questions. I encouraged Sherri Brooks's daughter to contact me should she have any further questions or if further follow up is desired.   Current Medications and Medical History   Current Outpatient Medications  Medication Sig Dispense Refill  . albuterol (PROVENTIL HFA;VENTOLIN HFA) 108 (90 BASE) MCG/ACT inhaler Inhale 2 puffs into the lungs every 6 (six) hours as needed. For bronchitis and coughing    . albuterol (PROVENTIL) (2.5 MG/3ML) 0.083% nebulizer solution Take 3 mLs (2.5 mg total) by nebulization every 6 (six) hours as needed for wheezing or shortness of breath. 75 mL 12  . alendronate (FOSAMAX) 70 MG tablet Take 70 mg by mouth every 7 (seven) days. On Mondays    . ALPRAZolam (XANAX) 0.5 MG tablet Take 0.5 mg by mouth 3 (three) times daily.    Marland Kitchen  amLODipine (NORVASC) 5 MG tablet Take 1 tablet (5 mg total) by mouth daily. 30 tablet 0  . aspirin 81 MG chewable tablet Chew 1 tablet (81 mg total) by mouth daily. 30 tablet 0  . atorvastatin (LIPITOR) 20 MG tablet Take 1 tablet (20 mg total) by mouth at bedtime. (Patient taking differently: Take 20 mg by mouth daily. ) 30 tablet 0  . clopidogrel (PLAVIX) 75 MG tablet Take 75 mg by mouth daily.      . ferrous sulfate 325 (65 FE) MG tablet Take 1 tablet (325 mg total) by mouth daily with breakfast.  3  . furosemide (LASIX) 20 MG tablet Take 20 mg by mouth every morning.    . insulin glargine (LANTUS) 100 UNIT/ML injection Inject 0.2 mLs (20 Units total) into the skin at bedtime. (Patient taking differently: Inject 30 Units into the skin at bedtime. ) 10 mL 11  . Iron-Vitamins (GERITOL COMPLETE PO) Take 1 tablet by mouth daily.     Marland Kitchen labetalol (NORMODYNE) 300 MG tablet Take 1 tablet (300 mg total) by mouth 2 (two) times daily. 60 tablet 0  . latanoprost (XALATAN) 0.005 % ophthalmic solution Place 1 drop into both eyes at bedtime.     . Omega-3 Fatty Acids (FISH OIL) 1000 MG CPDR Take 1,000 mg by mouth daily.     . varenicline (CHANTIX CONTINUING MONTH PAK) 1 MG tablet Take 1 tablet (1 mg total) by mouth 2 (two) times daily. 60 tablet 5   No current facility-administered medications for this visit.   Patient Active Problem List   Diagnosis Date Noted  . CKD (  chronic kidney disease) stage 3, GFR 30-59 ml/min 01/22/2020  . Memory loss   . Acute metabolic encephalopathy 95/79/0092  . Pulmonary nodule 01/21/2020  . Acute kidney injury ([place]) 06/10/2017  . Dyspnea 06/10/2017  . Essential hypertension 06/10/2017  . PVD (peripheral vascular disease) ([place])   . Ankle fracture, bimalleolar, closed 12/29/2012  . Anemia of unknown etiology 11/06/2012  . Atherosclerotic PVD with ulceration ([place]) 11/04/2012  . Diabetes mellitus with complication ([place]) 00/41/5930  . Tobacco use disorder 11/04/2012  .  Open wound of left foot 11/04/2012  . Atherosclerosis of native arteries of the extremities with ulceration ([place]) 07/09/2012  . Weight loss, unintentional 09/18/2011  . Anorexia 09/18/2011  . Early satiety 09/18/2011  . 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/07/2011    Mental Status and Behavioral Observations  Mental status not obtained because visit was completed with patient's family member.   Plan  Feedback provided regarding the patient's neuropsychological evaluation. I will make a referral to neurology for further diagnostic input and to assess for Parkinsonism. The family understand that this is primarily an evaluation and that they can consult with Dr. Gerarda Fraction regarding antidementia medication and ongoing care. Sherri Brooks was encouraged to contact me if any questions arise or if further follow up is desired.   Viviano Simas Nicole Kindred, PsyD, ABN Clinical Neuropsychologist  Service(s) Provided at This Encounter: 27 minutes [phone]; Therapy without patient present)

## 2020-03-17 ENCOUNTER — Other Ambulatory Visit: Payer: Self-pay | Admitting: Radiology

## 2020-03-18 ENCOUNTER — Other Ambulatory Visit (HOSPITAL_COMMUNITY)
Admission: RE | Admit: 2020-03-18 | Discharge: 2020-03-18 | Disposition: A | Discharge status: Home / Self Care | Payer: Medicare HMO | Source: Ambulatory Visit | Attending: Internal Medicine | Admitting: Internal Medicine

## 2020-03-18 ENCOUNTER — Ambulatory Visit (HOSPITAL_COMMUNITY)
Admission: RE | Admit: 2020-03-18 | Discharge: 2020-03-18 | Disposition: A | Discharge status: Home / Self Care | Payer: Medicare HMO | Source: Ambulatory Visit | Attending: Internal Medicine | Admitting: Internal Medicine

## 2020-03-18 ENCOUNTER — Other Ambulatory Visit: Payer: Self-pay

## 2020-03-18 ENCOUNTER — Other Ambulatory Visit: Payer: Self-pay | Admitting: Radiology

## 2020-03-18 DIAGNOSIS — Z01812 Encounter for preprocedural laboratory examination: Secondary | ICD-10-CM | POA: Insufficient documentation

## 2020-03-18 DIAGNOSIS — Z20822 Contact with and (suspected) exposure to covid-19: Secondary | ICD-10-CM | POA: Diagnosis not present

## 2020-03-19 LAB — SARS CORONAVIRUS 2 (TAT 6-24 HRS): SARS Coronavirus 2: NEGATIVE

## 2020-03-21 ENCOUNTER — Encounter (HOSPITAL_COMMUNITY): Payer: Self-pay

## 2020-03-21 ENCOUNTER — Other Ambulatory Visit: Payer: Self-pay

## 2020-03-21 ENCOUNTER — Ambulatory Visit (HOSPITAL_COMMUNITY)
Admission: RE | Admit: 2020-03-21 | Discharge: 2020-03-21 | Disposition: A | Discharge status: Home / Self Care | Payer: Medicare HMO | Source: Ambulatory Visit | Attending: Internal Medicine | Admitting: Internal Medicine

## 2020-03-21 DIAGNOSIS — K219 Gastro-esophageal reflux disease without esophagitis: Secondary | ICD-10-CM | POA: Diagnosis not present

## 2020-03-21 DIAGNOSIS — F17200 Nicotine dependence, unspecified, uncomplicated: Secondary | ICD-10-CM | POA: Insufficient documentation

## 2020-03-21 DIAGNOSIS — Z794 Long term (current) use of insulin: Secondary | ICD-10-CM | POA: Diagnosis not present

## 2020-03-21 DIAGNOSIS — Z955 Presence of coronary angioplasty implant and graft: Secondary | ICD-10-CM | POA: Insufficient documentation

## 2020-03-21 DIAGNOSIS — E785 Hyperlipidemia, unspecified: Secondary | ICD-10-CM | POA: Diagnosis not present

## 2020-03-21 DIAGNOSIS — J45909 Unspecified asthma, uncomplicated: Secondary | ICD-10-CM | POA: Diagnosis not present

## 2020-03-21 DIAGNOSIS — I10 Essential (primary) hypertension: Secondary | ICD-10-CM | POA: Diagnosis not present

## 2020-03-21 DIAGNOSIS — I252 Old myocardial infarction: Secondary | ICD-10-CM | POA: Insufficient documentation

## 2020-03-21 DIAGNOSIS — R918 Other nonspecific abnormal finding of lung field: Secondary | ICD-10-CM | POA: Diagnosis not present

## 2020-03-21 DIAGNOSIS — Z833 Family history of diabetes mellitus: Secondary | ICD-10-CM | POA: Diagnosis not present

## 2020-03-21 DIAGNOSIS — E1151 Type 2 diabetes mellitus with diabetic peripheral angiopathy without gangrene: Secondary | ICD-10-CM | POA: Diagnosis not present

## 2020-03-21 DIAGNOSIS — M1990 Unspecified osteoarthritis, unspecified site: Secondary | ICD-10-CM | POA: Insufficient documentation

## 2020-03-21 DIAGNOSIS — Z5309 Procedure and treatment not carried out because of other contraindication: Secondary | ICD-10-CM | POA: Insufficient documentation

## 2020-03-21 DIAGNOSIS — I2510 Atherosclerotic heart disease of native coronary artery without angina pectoris: Secondary | ICD-10-CM | POA: Diagnosis not present

## 2020-03-21 DIAGNOSIS — Z9862 Peripheral vascular angioplasty status: Secondary | ICD-10-CM | POA: Diagnosis not present

## 2020-03-21 DIAGNOSIS — Z7982 Long term (current) use of aspirin: Secondary | ICD-10-CM | POA: Insufficient documentation

## 2020-03-21 DIAGNOSIS — Z79899 Other long term (current) drug therapy: Secondary | ICD-10-CM | POA: Diagnosis not present

## 2020-03-21 DIAGNOSIS — Z7983 Long term (current) use of bisphosphonates: Secondary | ICD-10-CM | POA: Insufficient documentation

## 2020-03-21 LAB — GLUCOSE, CAPILLARY: Glucose-Capillary: 78 mg/dL (ref 70–99)

## 2020-03-21 LAB — PROTIME-INR
INR: 1 (ref 0.8–1.2)
Prothrombin Time: 13.1 seconds (ref 11.4–15.2)

## 2020-03-21 LAB — CBC
HCT: 30 % — ABNORMAL LOW (ref 36.0–46.0)
Hemoglobin: 9.5 g/dL — ABNORMAL LOW (ref 12.0–15.0)
MCH: 27.6 pg (ref 26.0–34.0)
MCHC: 31.7 g/dL (ref 30.0–36.0)
MCV: 87.2 fL (ref 80.0–100.0)
Platelets: 278 10*3/uL (ref 150–400)
RBC: 3.44 MIL/uL — ABNORMAL LOW (ref 3.87–5.11)
RDW: 19.2 % — ABNORMAL HIGH (ref 11.5–15.5)
WBC: 8.7 10*3/uL (ref 4.0–10.5)
nRBC: 0 % (ref 0.0–0.2)

## 2020-03-21 MED ORDER — SODIUM CHLORIDE 0.9 % IV SOLN: INTRAVENOUS | Status: DC

## 2020-03-21 NOTE — H&P (Addendum)
Chief Complaint: Patient was seen in consultation today for left lung mass biopsy at the request of [place] S  Referring Physician(s): [place] S  Supervising Physician: Daryll Brod  Patient Status: [facility] - Out-pt  History of Present Illness: Sherri Brooks is a 74 y.o. female   Pt was being evaluated for Altered mental status Work up included CXR 01/21/20 CXR noted left lung mass ++ smoker  Work up continued with CT: IMPRESSION: 1. Spiculated left upper lobe pulmonary nodule measuring 2.3 x 1.7 cm, highly suspicious for primary bronchogenic malignancy. 2. Prominent subcarinal lymph node measuring 14 mm, nonspecific. Lack of IV contrast limits assessment for hilar adenopathy. 3. Mild emphysema and bronchial thickening. 4. Incidental cholelithiasis in the upper abdomen.  PET 03/07/20: IMPRESSION: 1. Hypermetabolic thick-walled cavitary lesion in the LEFT upper lobe consistent with bronchogenic carcinoma. 2. No hypermetabolic mediastinal adenopathy. No Evidence of distant metastatic disease. 3. Bilateral hypermetabolic lesions of the parotid glands. Favor primary parotid neoplasm.  Now request for lung mass biopsy per Dr Shearon Stalls LD Plavix May 31   Past Medical History:  Diagnosis Date  . Arthritis   . Asthma   . CAD (coronary artery disease)   . Cellulitis of buttock, left 2010  . Diabetes mellitus   . GERD (gastroesophageal reflux disease)   . Hyperlipidemia   . Hypertension   . MI (myocardial infarction) ([place])   . PVD (peripheral vascular disease) ([place])   . Wears glasses     Past Surgical History:  Procedure Laterality Date  . ABDOMINAL AORTAGRAM N/A 07/28/2012   Procedure: ABDOMINAL Maxcine Ham;  Surgeon: Angelia Mould, MD;  Location: [facility] CATH LAB;  Service: Cardiovascular;  Laterality: N/A;  . ABDOMINAL AORTOGRAM W/LOWER EXTREMITY N/A 06/10/2017   Procedure: ABDOMINAL AORTOGRAM W/LOWER EXTREMITY;  Surgeon: Angelia Mould, MD;   Location: [place] CV LAB;  Service: Cardiovascular;  Laterality: N/A;  . COLONOSCOPY  10/26/2011   Procedure: COLONOSCOPY;  Surgeon: Dorothyann Peng, MD;  [facility];  Service: Endoscopy;  Laterality: N/A;  1:30  . CORONARY STENT INTERVENTION N/A 06/14/2017   Procedure: CORONARY STENT INTERVENTION;  Surgeon: Leonie Man, MD;  Location: [place] CV LAB;  Service: Cardiovascular;  Laterality: N/A;  . FEMORAL-POPLITEAL BYPASS GRAFT  07/2010   pt has had 3 fem-pop bpg  . FEMORAL-POPLITEAL BYPASS GRAFT  11/04/2012   Procedure: BYPASS GRAFT FEMORAL-POPLITEAL ARTERY;  Surgeon: Angelia Mould, MD;  Location: [facility] OR;  Service: Vascular;  Laterality: Left;  Redo Left Femoral Popliteal Bypass with PTFE  . FOOT AMPUTATION  2010   left  . LEFT HEART CATH AND CORONARY ANGIOGRAPHY N/A 06/14/2017   Procedure: LEFT HEART CATH AND CORONARY ANGIOGRAPHY;  Surgeon: Leonie Man, MD;  Location: [place] CV LAB;  Service: Cardiovascular;  Laterality: N/A;  . LOWER EXTREMITY ANGIOGRAM Bilateral 07/28/2012   Procedure: LOWER EXTREMITY ANGIOGRAM;  Surgeon: Angelia Mould, MD;  Location: [facility] CATH LAB;  Service: Cardiovascular;  Laterality: Bilateral;  . LOWER EXTREMITY ANGIOGRAM Left 01/02/2016   Procedure: Lower Extremity Angiogram;  Surgeon: Angelia Mould, MD;  Location: [place] CV LAB;  Service: Cardiovascular;  Laterality: Left;  . LOWER EXTREMITY ANGIOGRAM Left 07/03/2017   Procedure: Lower Extremity Angiogram;  Surgeon: Waynetta Sandy, MD;  Location: [place] CV LAB;  Service: Cardiovascular;  Laterality: Left;  . LOWER EXTREMITY INTERVENTION Left 07/03/2017   Procedure: Lower Extremity Intervention;  Surgeon: Waynetta Sandy, MD;  Location: [place] CV LAB;  Service:  Cardiovascular;  Laterality: Left;  . ORIF ANKLE FRACTURE Left 01/27/2013   Procedure: OPEN REDUCTION INTERNAL FIXATION (ORIF) ANKLE FRACTURE;  Surgeon: Wylene Simmer, MD;   Location: [place];  Service: Orthopedics;  Laterality: Left;  . PERCUTANEOUS STENT INTERVENTION Left 07/28/2012   Procedure: PERCUTANEOUS STENT INTERVENTION;  Surgeon: Angelia Mould, MD;  Location: [facility] CATH LAB;  Service: Cardiovascular;  Laterality: Left;  lt ext tiliac stentx1  . PERIPHERAL VASCULAR BALLOON ANGIOPLASTY Left 06/10/2017   Procedure: PERIPHERAL VASCULAR BALLOON ANGIOPLASTY;  Surgeon: Angelia Mould, MD;  Location: [place] CV LAB;  Service: Cardiovascular;  Laterality: Left;  external iliac  . PERIPHERAL VASCULAR CATHETERIZATION N/A 01/02/2016   Procedure: Abdominal Aortogram;  Surgeon: Angelia Mould, MD;  Location: [place] CV LAB;  Service: Cardiovascular;  Laterality: N/A;  . PERIPHERAL VASCULAR CATHETERIZATION Left 01/02/2016   Procedure: Peripheral Vascular Intervention;  Surgeon: Angelia Mould, MD;  Location: [place] CV LAB;  Service: Cardiovascular;  Laterality: Left;  lt ext iliac  . right common iliac stent  10/2009   Dr Doren Custard ([place])  . S/P Hysterecotmy     partial  . TONSILLECTOMY      Allergies: Hydrocodone and Iohexol  Medications: Prior to Admission medications   Medication Sig Start Date End Date Taking? Authorizing Provider  albuterol (PROVENTIL HFA;VENTOLIN HFA) 108 (90 BASE) MCG/ACT inhaler Inhale 2 puffs into the lungs every 6 (six) hours as needed. For bronchitis and coughing    [provider]  albuterol (PROVENTIL) (2.5 MG/3ML) 0.083% nebulizer solution Take 3 mLs (2.5 mg total) by nebulization every 6 (six) hours as needed for wheezing or shortness of breath. 02/25/20   Spero Geralds, MD  alendronate (FOSAMAX) 70 MG tablet Take 70 mg by mouth every 7 (seven) days. On Mondays 08/30/11   [provider]  ALPRAZolam Duanne Moron) 0.5 MG tablet Take 0.5 mg by mouth 3 (three) times daily. 01/01/20   [provider]  amLODipine (NORVASC) 5 MG tablet Take 1 tablet (5 mg total) by mouth  daily. 06/16/17   Velvet Bathe, MD  aspirin 81 MG chewable tablet Chew 1 tablet (81 mg total) by mouth daily. 06/16/17   Velvet Bathe, MD  atorvastatin (LIPITOR) 20 MG tablet Take 1 tablet (20 mg total) by mouth at bedtime. Patient taking differently: Take 20 mg by mouth daily.  06/15/17   Velvet Bathe, MD  clopidogrel (PLAVIX) 75 MG tablet Take 75 mg by mouth daily.      [provider]  ferrous sulfate 325 (65 FE) MG tablet Take 1 tablet (325 mg total) by mouth daily with breakfast. 01/24/20   Tat, Shanon Brow, MD  furosemide (LASIX) 20 MG tablet Take 20 mg by mouth every morning. 01/01/20   [provider]  insulin glargine (LANTUS) 100 UNIT/ML injection Inject 0.2 mLs (20 Units total) into the skin at bedtime. Patient taking differently: Inject 30 Units into the skin at bedtime.  06/15/17   Velvet Bathe, MD  Iron-Vitamins (GERITOL COMPLETE PO) Take 1 tablet by mouth daily.     [provider]  labetalol (NORMODYNE) 300 MG tablet Take 1 tablet (300 mg total) by mouth 2 (two) times daily. 06/15/17   Velvet Bathe, MD  latanoprost (XALATAN) 0.005 % ophthalmic solution Place 1 drop into both eyes at bedtime.  01/01/20   [provider]  Omega-3 Fatty Acids (FISH OIL) 1000 MG CPDR Take 1,000 mg by mouth daily.     [provider]  varenicline (  CHANTIX CONTINUING MONTH PAK) 1 MG tablet Take 1 tablet (1 mg total) by mouth 2 (two) times daily. 02/25/20   Spero Geralds, MD     Family History  Problem Relation Age of Onset  . Brain cancer Father   . Cancer Father   . Diabetes Mother        many family members  . Alzheimer's disease Mother   . Heart disease Other     Social History   Socioeconomic History  . Marital status: Widowed    Spouse name: Not on file  . Number of children: 3  . Years of education: Not on file  . Highest education level: Not on file  Occupational History  . Occupation: retired; Biochemist, clinical: RETIRED  Tobacco Use    . Smoking status: Current Every Day Smoker    Packs/day: 1.00    Years: 30.00    Pack years: 30.00    Types: Cigarettes    Start date: 11/04/2012  . Smokeless tobacco: Never Used  . Tobacco comment: trying to cut back, 1/2 a pack 02/25/20  Substance and Sexual Activity  . Alcohol use: No    Alcohol/week: 0.0 standard drinks  . Drug use: No  . Sexual activity: Not on file  Other Topics Concern  . Not on file  Social History Narrative  . Not on file   Social Determinants of Health   Financial Resource Strain:   . Difficulty of Paying Living Expenses:   Food Insecurity:   . Worried About Charity fundraiser in the Last Year:   . Arboriculturist in the Last Year:   Transportation Needs:   . Film/video editor (Medical):   Marland Kitchen Lack of Transportation (Non-Medical):   Physical Activity:   . Days of Exercise per Week:   . Minutes of Exercise per Session:   Stress:   . Feeling of Stress :   Social Connections:   . Frequency of Communication with Friends and Family:   . Frequency of Social Gatherings with Friends and Family:   . Attends Religious Services:   . Active Member of Clubs or Organizations:   . Attends Archivist Meetings:   Marland Kitchen Marital Status:     Review of Systems: A 12 point ROS discussed and pertinent positives are indicated in the HPI above.  All other systems are negative.  Review of Systems  Constitutional: Negative for activity change, fatigue and fever.  Respiratory: Positive for cough. Negative for shortness of breath.   Cardiovascular: Negative for chest pain.  Gastrointestinal: Negative for abdominal pain.  Psychiatric/Behavioral: Positive for decreased concentration. Negative for behavioral problems.    Vital Signs: BP (!) 134/38   Pulse 64   Temp (!) 97.5 F (36.4 C) (Skin)   Resp 14   Ht 5\' 3"  (1.6 m)   Wt 135 lb (61.2 kg)   SpO2 100%   BMI 23.91 kg/m   Physical Exam Vitals reviewed.  Cardiovascular:     Rate and Rhythm:  Normal rate and regular rhythm.     Heart sounds: Normal heart sounds.  Pulmonary:     Effort: Pulmonary effort is normal.     Breath sounds: Normal breath sounds.  Abdominal:     Palpations: Abdomen is soft.  Musculoskeletal:        General: Normal range of motion.  Skin:    General: Skin is warm and dry.  Neurological:     Mental  Status: She is alert. Mental status is at baseline.  Psychiatric:        Behavior: Behavior normal.     Comments: Consented pt at bedside- as well as Dtr Otilio Saber via phone     Imaging: NM PET Image Initial (PI) Skull Base To Thigh  Result Date: 03/08/2020 CLINICAL DATA:  Initial treatment strategy for pulmonary nodule. EXAM: NUCLEAR MEDICINE PET SKULL BASE TO THIGH TECHNIQUE: 8.23 mCi F-18 FDG was injected intravenously. Full-ring PET imaging was performed from the skull base to thigh after the radiotracer. CT data was obtained and used for attenuation correction and anatomic localization. Fasting blood glucose: 78 mg/dl COMPARISON:  CT 01/21/2020 FINDINGS: Mediastinal blood pool activity: SUV max 2.6 Liver activity: SUV max NA NECK: Hypermetabolic lesions within the LEFT and RIGHT parotid gland. Lesion in the LEFT parotid gland is larger measuring 1.4 cm with SUV max equal 16.3. Small lesion in the RIGHT gland measures 0.8 cm SUV max equal 6.9. No hypermetabolic lymph nodes in the neck. Incidental CT findings: none CHEST: LEFT thick-walled cavitary lesion in the LEFT upper lobe measures 2.2 cm with SUV max equal 20.9. No additional pulmonary nodules. No hypermetabolic mediastinal lymph nodes. No hypermetabolic supraclavicular nodes. Incidental CT findings: Coronary artery calcification and aortic atherosclerotic calcification. ABDOMEN/PELVIS: No abnormal hypermetabolic activity within the liver, pancreas, adrenal glands, or spleen. No hypermetabolic lymph nodes in the abdomen or pelvis. Incidental CT findings: none SKELETON: No focal hypermetabolic activity to  suggest skeletal metastasis. Incidental CT findings: none IMPRESSION: 1. Hypermetabolic thick-walled cavitary lesion in the LEFT upper lobe consistent with bronchogenic carcinoma. 2. No hypermetabolic mediastinal adenopathy. No Evidence of distant metastatic disease. 3. Bilateral hypermetabolic lesions of the parotid glands. Favor primary parotid neoplasm. These results will be called to the ordering clinician or representative by the Radiologist Assistant, and communication documented in the PACS or [employer]. Electronically Signed   By: Suzy Bouchard M.D.   On: 03/08/2020 08:56    Labs:  CBC: Recent Labs    01/21/20 1431 01/22/20 0601 01/23/20 0604  WBC 7.3 7.2 7.0  HGB 9.2* 9.5* 8.4*  HCT 29.6* 30.7* 27.0*  PLT 401* 373 344    COAGS: No results for input(s): INR, APTT in the last 8760 hours.  BMP: Recent Labs    01/21/20 1431 01/22/20 0601 01/23/20 0604  NA 139 141 140  K 3.5 3.8 3.6  CL 103 108 105  CO2 26 25 24   GLUCOSE 107* 103* 110*  BUN 18 16 14   CALCIUM 10.6* 9.2 8.8*  CREATININE 1.37* 1.25* 1.22*  GFRNONAA 38* 42* 43*  GFRAA 44* 49* 50*    LIVER FUNCTION TESTS: Recent Labs    01/21/20 1431  BILITOT 0.5  AST 21  ALT 13  ALKPHOS 107  PROT 8.1  ALBUMIN 3.1*    TUMOR MARKERS: No results for input(s): AFPTM, CEA, CA199, CHROMGRNA in the last 8760 hours.  Assessment and Plan:  LUL mass; +PET ++ smoker Off Plavix now Scheduled for left lung mass biopsy Risks and benefits of CT guided lung nodule biopsy was discussed with the patient including, but not limited to bleeding, hemoptysis, respiratory failure requiring intubation, infection, pneumothorax requiring chest tube placement, stroke from air embolism or even death.  All of the patient's questions were answered and the patient is agreeable to proceed. Consent signed and in chart.   Thank you for this interesting consult.  I greatly enjoyed meeting Jasia Hiltunen and look forward to  participating in their  care.  A copy of this report was sent to the requesting provider on this date.  Electronically Signed: Lavonia Drafts, PA-C 03/21/2020, 10:13 AM   I spent a total of  30 Minutes   in face to face in clinical consultation, greater than 50% of which was counseling/coordinating care for left lung mass biopsy

## 2020-03-21 NOTE — Progress Notes (Addendum)
CT lung biopsy scheduled for today cancelled per Dr. Annamaria Boots after discussion with patient daughter. IV removed and patient dressed. Patient daughter informed that patient is ready to be picked up. Daughter states she will call IR nurses station when she arrives back to hospital.

## 2020-03-24 ENCOUNTER — Other Ambulatory Visit: Payer: Self-pay | Admitting: *Deleted

## 2020-03-24 NOTE — Progress Notes (Signed)
The proposed treatment discussed in cancer conference 03/24/20 is for discussion purpose only and is not a binding recommendation.  The patient was not physically examined nor present for their treatment options.  Therefore, final treatment plans cannot be decided.   Patient is set up with pulmonary team

## 2020-04-01 DIAGNOSIS — R634 Abnormal weight loss: Secondary | ICD-10-CM | POA: Diagnosis not present

## 2020-04-01 DIAGNOSIS — F0390 Unspecified dementia without behavioral disturbance: Secondary | ICD-10-CM | POA: Diagnosis not present

## 2020-04-01 DIAGNOSIS — R413 Other amnesia: Secondary | ICD-10-CM | POA: Diagnosis not present

## 2020-04-01 DIAGNOSIS — R630 Anorexia: Secondary | ICD-10-CM | POA: Diagnosis not present

## 2020-04-02 DIAGNOSIS — D509 Iron deficiency anemia, unspecified: Secondary | ICD-10-CM | POA: Diagnosis not present

## 2020-04-02 DIAGNOSIS — K3189 Other diseases of stomach and duodenum: Secondary | ICD-10-CM | POA: Diagnosis not present

## 2020-04-02 DIAGNOSIS — E1165 Type 2 diabetes mellitus with hyperglycemia: Secondary | ICD-10-CM | POA: Diagnosis not present

## 2020-04-02 DIAGNOSIS — N179 Acute kidney failure, unspecified: Secondary | ICD-10-CM | POA: Diagnosis not present

## 2020-04-02 DIAGNOSIS — K921 Melena: Secondary | ICD-10-CM | POA: Diagnosis not present

## 2020-04-02 DIAGNOSIS — N183 Chronic kidney disease, stage 3 unspecified: Secondary | ICD-10-CM | POA: Diagnosis not present

## 2020-04-02 DIAGNOSIS — E1151 Type 2 diabetes mellitus with diabetic peripheral angiopathy without gangrene: Secondary | ICD-10-CM | POA: Diagnosis not present

## 2020-04-02 DIAGNOSIS — Z20822 Contact with and (suspected) exposure to covid-19: Secondary | ICD-10-CM | POA: Diagnosis not present

## 2020-04-02 DIAGNOSIS — I340 Nonrheumatic mitral (valve) insufficiency: Secondary | ICD-10-CM | POA: Diagnosis not present

## 2020-04-02 DIAGNOSIS — E119 Type 2 diabetes mellitus without complications: Secondary | ICD-10-CM | POA: Diagnosis not present

## 2020-04-02 DIAGNOSIS — E1122 Type 2 diabetes mellitus with diabetic chronic kidney disease: Secondary | ICD-10-CM | POA: Diagnosis not present

## 2020-04-02 DIAGNOSIS — R55 Syncope and collapse: Secondary | ICD-10-CM | POA: Diagnosis not present

## 2020-04-02 DIAGNOSIS — D649 Anemia, unspecified: Secondary | ICD-10-CM | POA: Diagnosis not present

## 2020-04-02 DIAGNOSIS — K2970 Gastritis, unspecified, without bleeding: Secondary | ICD-10-CM | POA: Diagnosis not present

## 2020-04-02 DIAGNOSIS — K922 Gastrointestinal hemorrhage, unspecified: Secondary | ICD-10-CM | POA: Diagnosis not present

## 2020-04-02 DIAGNOSIS — G459 Transient cerebral ischemic attack, unspecified: Secondary | ICD-10-CM | POA: Diagnosis not present

## 2020-04-02 DIAGNOSIS — I129 Hypertensive chronic kidney disease with stage 1 through stage 4 chronic kidney disease, or unspecified chronic kidney disease: Secondary | ICD-10-CM | POA: Diagnosis not present

## 2020-04-02 DIAGNOSIS — Z8719 Personal history of other diseases of the digestive system: Secondary | ICD-10-CM | POA: Diagnosis not present

## 2020-04-02 DIAGNOSIS — K2960 Other gastritis without bleeding: Secondary | ICD-10-CM | POA: Diagnosis not present

## 2020-04-02 DIAGNOSIS — E11649 Type 2 diabetes mellitus with hypoglycemia without coma: Secondary | ICD-10-CM | POA: Diagnosis not present

## 2020-04-02 DIAGNOSIS — I2510 Atherosclerotic heart disease of native coronary artery without angina pectoris: Secondary | ICD-10-CM | POA: Diagnosis not present

## 2020-04-02 DIAGNOSIS — D62 Acute posthemorrhagic anemia: Secondary | ICD-10-CM | POA: Diagnosis not present

## 2020-04-08 DIAGNOSIS — E876 Hypokalemia: Secondary | ICD-10-CM | POA: Diagnosis not present

## 2020-04-08 DIAGNOSIS — I6523 Occlusion and stenosis of bilateral carotid arteries: Secondary | ICD-10-CM | POA: Diagnosis not present

## 2020-04-08 DIAGNOSIS — E1140 Type 2 diabetes mellitus with diabetic neuropathy, unspecified: Secondary | ICD-10-CM | POA: Diagnosis not present

## 2020-04-08 DIAGNOSIS — D649 Anemia, unspecified: Secondary | ICD-10-CM | POA: Diagnosis not present

## 2020-04-08 DIAGNOSIS — I10 Essential (primary) hypertension: Secondary | ICD-10-CM | POA: Diagnosis not present

## 2020-04-08 DIAGNOSIS — K9289 Other specified diseases of the digestive system: Secondary | ICD-10-CM | POA: Diagnosis not present

## 2020-04-08 DIAGNOSIS — Z6822 Body mass index (BMI) 22.0-22.9, adult: Secondary | ICD-10-CM | POA: Diagnosis not present

## 2020-04-12 DIAGNOSIS — K118 Other diseases of salivary glands: Secondary | ICD-10-CM | POA: Diagnosis not present

## 2020-04-12 DIAGNOSIS — H9212 Otorrhea, left ear: Secondary | ICD-10-CM | POA: Diagnosis not present

## 2020-04-13 ENCOUNTER — Other Ambulatory Visit: Payer: Self-pay | Admitting: Otolaryngology

## 2020-04-13 DIAGNOSIS — I5032 Chronic diastolic (congestive) heart failure: Secondary | ICD-10-CM | POA: Diagnosis not present

## 2020-04-13 DIAGNOSIS — Z89431 Acquired absence of right foot: Secondary | ICD-10-CM | POA: Diagnosis not present

## 2020-04-13 DIAGNOSIS — I130 Hypertensive heart and chronic kidney disease with heart failure and stage 1 through stage 4 chronic kidney disease, or unspecified chronic kidney disease: Secondary | ICD-10-CM | POA: Diagnosis not present

## 2020-04-13 DIAGNOSIS — Z794 Long term (current) use of insulin: Secondary | ICD-10-CM | POA: Diagnosis not present

## 2020-04-13 DIAGNOSIS — K118 Other diseases of salivary glands: Secondary | ICD-10-CM

## 2020-04-13 DIAGNOSIS — E1122 Type 2 diabetes mellitus with diabetic chronic kidney disease: Secondary | ICD-10-CM | POA: Diagnosis not present

## 2020-04-14 ENCOUNTER — Encounter: Payer: Self-pay | Admitting: Internal Medicine

## 2020-04-22 DIAGNOSIS — I10 Essential (primary) hypertension: Secondary | ICD-10-CM | POA: Diagnosis not present

## 2020-04-22 DIAGNOSIS — I70209 Unspecified atherosclerosis of native arteries of extremities, unspecified extremity: Secondary | ICD-10-CM | POA: Diagnosis not present

## 2020-04-22 DIAGNOSIS — E1140 Type 2 diabetes mellitus with diabetic neuropathy, unspecified: Secondary | ICD-10-CM | POA: Diagnosis not present

## 2020-04-22 DIAGNOSIS — I6523 Occlusion and stenosis of bilateral carotid arteries: Secondary | ICD-10-CM | POA: Diagnosis not present

## 2020-04-22 DIAGNOSIS — E1151 Type 2 diabetes mellitus with diabetic peripheral angiopathy without gangrene: Secondary | ICD-10-CM | POA: Diagnosis not present

## 2020-04-22 DIAGNOSIS — R918 Other nonspecific abnormal finding of lung field: Secondary | ICD-10-CM | POA: Diagnosis not present

## 2020-04-22 DIAGNOSIS — K9289 Other specified diseases of the digestive system: Secondary | ICD-10-CM | POA: Diagnosis not present

## 2020-04-22 DIAGNOSIS — K922 Gastrointestinal hemorrhage, unspecified: Secondary | ICD-10-CM | POA: Diagnosis not present

## 2020-04-22 DIAGNOSIS — J449 Chronic obstructive pulmonary disease, unspecified: Secondary | ICD-10-CM | POA: Diagnosis not present

## 2020-04-22 DIAGNOSIS — D509 Iron deficiency anemia, unspecified: Secondary | ICD-10-CM | POA: Diagnosis not present

## 2020-04-25 DIAGNOSIS — E1140 Type 2 diabetes mellitus with diabetic neuropathy, unspecified: Secondary | ICD-10-CM | POA: Diagnosis not present

## 2020-04-25 DIAGNOSIS — I6523 Occlusion and stenosis of bilateral carotid arteries: Secondary | ICD-10-CM | POA: Diagnosis not present

## 2020-04-25 DIAGNOSIS — R918 Other nonspecific abnormal finding of lung field: Secondary | ICD-10-CM | POA: Diagnosis not present

## 2020-04-25 DIAGNOSIS — D509 Iron deficiency anemia, unspecified: Secondary | ICD-10-CM | POA: Diagnosis not present

## 2020-04-25 DIAGNOSIS — K922 Gastrointestinal hemorrhage, unspecified: Secondary | ICD-10-CM | POA: Diagnosis not present

## 2020-04-25 DIAGNOSIS — J449 Chronic obstructive pulmonary disease, unspecified: Secondary | ICD-10-CM | POA: Diagnosis not present

## 2020-04-25 DIAGNOSIS — I10 Essential (primary) hypertension: Secondary | ICD-10-CM | POA: Diagnosis not present

## 2020-04-25 DIAGNOSIS — E1151 Type 2 diabetes mellitus with diabetic peripheral angiopathy without gangrene: Secondary | ICD-10-CM | POA: Diagnosis not present

## 2020-04-25 DIAGNOSIS — I70209 Unspecified atherosclerosis of native arteries of extremities, unspecified extremity: Secondary | ICD-10-CM | POA: Diagnosis not present

## 2020-04-26 ENCOUNTER — Other Ambulatory Visit: Payer: Self-pay

## 2020-04-26 ENCOUNTER — Ambulatory Visit: Payer: Medicare HMO | Admitting: Primary Care

## 2020-04-26 ENCOUNTER — Encounter: Payer: Self-pay | Admitting: Primary Care

## 2020-04-26 VITALS — BP 112/62 | HR 63 | Temp 98.6°F | Ht 63.0 in | Wt 120.2 lb

## 2020-04-26 DIAGNOSIS — J984 Other disorders of lung: Secondary | ICD-10-CM

## 2020-04-26 DIAGNOSIS — K118 Other diseases of salivary glands: Secondary | ICD-10-CM | POA: Diagnosis not present

## 2020-04-26 DIAGNOSIS — R911 Solitary pulmonary nodule: Secondary | ICD-10-CM | POA: Diagnosis not present

## 2020-04-26 DIAGNOSIS — Z01818 Encounter for other preprocedural examination: Secondary | ICD-10-CM | POA: Diagnosis not present

## 2020-04-26 NOTE — Progress Notes (Signed)
@Patient  ID: Sherri Brooks, female    DOB: 1945-12-11, 74 y.o.   MRN: [id number]  Chief Complaint  Patient presents with  . Follow-up    Referring provider: Redmond School, MD  HPI: 74 year old female, current everyday smoker (30-pack-year history plus).  Past medical history significant for simple chronic bronchitis, pulmonary nodule, hypertension, arthrosclerosis of native arteries, diabetes mellitus, chronic kidney disease stage III, anemia.  Patient of Dr. Shearon Stalls, seen for initial consult on 02/25/2020.   She was ordered for PET scan which showed hypermetabolic thick-walled cavitary lesion left upper lobe consistent with bronchogenic carcinoma, no evidence of distant disease.  She also had bilateral hypermetabolic lesion on parotid gland favoring primary parotid neoplasm. Bronchoscopy cancelled, she was ordered for CT-guided biopsy since there was no PET avid lymphadenopathy.  She was referred to ENT for parotid mass.  04/26/2020 - interim hx Patient presents today for follow-up visit. Since last visit her PET scan was positive for LUL cavitary lesion with no distant disease and parotid lesion. She left without getting bx because of potential complications that were explained to her. She was seen by [facility] otolaryngology on 04/12/20 for parotid mass/ear drainage with perforation. 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   She is still smoking, she is starting on Chantix and is wanting to quit smoking. She has quit in the past and feels confident in her ability to quit smoking. She has not had recent pulmonary function testing. She is unsure if she wants treatment. At this time she has elected to monitor left upper lung mass with CT in 3 months. I will discuss with this oncology and with Dr. Shearon Stalls     Imaging: 03/08/2020 PET scan-hypermetabolic thick-walled cavitary lesion left upper lobe consistent with bronchogenic  carcinoma.  No hypermetabolic mediastinal adenopathy.  No evidence of distant disease.  Bilateral hypermetabolic lesion of parotid glands, favor primary parotid neoplasm.  Allergies  Allergen Reactions  . Hydrocodone Hives and Itching  . Iohexol Hives         Immunization History  Administered Date(s) Administered  . Influenza, High Dose Seasonal PF 06/28/2019  . Tdap 01/16/2019    Past Medical History:  Diagnosis Date  . Arthritis   . Asthma   . CAD (coronary artery disease)   . Cellulitis of buttock, left 2010  . Diabetes mellitus   . GERD (gastroesophageal reflux disease)   . Hyperlipidemia   . Hypertension   . MI (myocardial infarction) ([place])   . PVD (peripheral vascular disease) ([place])   . Wears glasses     Tobacco History: Social History   Tobacco Use  Smoking Status Current Every Day Smoker  . Packs/day: 1.00  . Years: 30.00  . Pack years: 30.00  . Types: Cigarettes  . Start date: 11/04/2012  Smokeless Tobacco Never Used  Tobacco Comment   trying to cut back, 1/2 a pack 02/25/20   Ready to quit: Not Answered Counseling given: Not Answered Comment: trying to cut back, 1/2 a pack 02/25/20   Outpatient Medications Prior to Visit  Medication Sig Dispense Refill  . albuterol (PROVENTIL HFA;VENTOLIN HFA) 108 (90 BASE) MCG/ACT inhaler Inhale 2 puffs into the lungs every 6 (six) hours as needed. For bronchitis and coughing    . albuterol (PROVENTIL) (2.5 MG/3ML) 0.083% nebulizer solution Take 3 mLs (2.5 mg total) by nebulization every 6 (six) hours as needed for wheezing or shortness of breath. 75 mL 12  . alendronate (  FOSAMAX) 70 MG tablet Take 70 mg by mouth every 7 (seven) days. On Mondays    . ALPRAZolam (XANAX) 0.5 MG tablet Take 0.5 mg by mouth 3 (three) times daily.    Marland Kitchen amLODipine (NORVASC) 5 MG tablet Take 1 tablet (5 mg total) by mouth daily. 30 tablet 0  . aspirin 81 MG chewable tablet Chew 1 tablet (81 mg total) by mouth daily. 30 tablet 0  .  atorvastatin (LIPITOR) 20 MG tablet Take 1 tablet (20 mg total) by mouth at bedtime. (Patient taking differently: Take 20 mg by mouth daily. ) 30 tablet 0  . clopidogrel (PLAVIX) 75 MG tablet Take 75 mg by mouth daily.      Marland Kitchen donepezil (ARICEPT) 5 MG tablet     . dronabinol (MARINOL) 5 MG capsule     . ferrous sulfate 325 (65 FE) MG tablet Take 1 tablet (325 mg total) by mouth daily with breakfast.  3  . furosemide (LASIX) 20 MG tablet Take 20 mg by mouth every morning.    . insulin glargine (LANTUS) 100 UNIT/ML injection Inject 0.2 mLs (20 Units total) into the skin at bedtime. (Patient taking differently: Inject 30 Units into the skin at bedtime. ) 10 mL 11  . Iron-Vitamins (GERITOL COMPLETE PO) Take 1 tablet by mouth daily.     Marland Kitchen labetalol (NORMODYNE) 300 MG tablet Take 1 tablet (300 mg total) by mouth 2 (two) times daily. 60 tablet 0  . latanoprost (XALATAN) 0.005 % ophthalmic solution Place 1 drop into both eyes at bedtime.     . Omega-3 Fatty Acids (FISH OIL) 1000 MG CPDR Take 1,000 mg by mouth daily.     . varenicline (CHANTIX CONTINUING MONTH PAK) 1 MG tablet Take 1 tablet (1 mg total) by mouth 2 (two) times daily. 60 tablet 5   No facility-administered medications prior to visit.   Review of Systems  Review of Systems  Constitutional: Positive for unexpected weight change.  Respiratory: Positive for cough. Negative for chest tightness and wheezing.        Mild dyspnea on exertion; occasional cough   Cardiovascular: Negative.   Psychiatric/Behavioral: Negative.    Physical Exam  BP 112/62 (BP Location: Left Arm, Cuff Size: Normal)   Pulse 63   Temp 98.6 F (37 C) (Oral)   Ht 5\' 3"  (1.6 m)   Wt 120 lb 3.2 oz (54.5 kg)   SpO2 100%   BMI 21.29 kg/m  Physical Exam Constitutional:      General: She is not in acute distress.    Appearance: Normal appearance.     Comments: Thin adult female  HENT:     Right Ear: Tympanic membrane normal. There is no impacted cerumen.      Left Ear: Tympanic membrane normal. There is no impacted cerumen.     Mouth/Throat:     Mouth: Mucous membranes are moist.     Pharynx: Oropharynx is clear.  Cardiovascular:     Rate and Rhythm: Normal rate and regular rhythm.  Pulmonary:     Effort: Pulmonary effort is normal.     Breath sounds: No wheezing, rhonchi or rales.     Comments: CTA. No respiratory distress; NOT on oxygen  Skin:    General: Skin is warm and dry.  Neurological:     General: No focal deficit present.     Mental Status: She is alert and oriented to person, place, and time. Mental status is at baseline.  Psychiatric:  Mood and Affect: Mood normal.        Behavior: Behavior normal.        Thought Content: Thought content normal.        Judgment: Judgment normal.      Lab Results:  CBC    Component Value Date/Time   WBC 8.7 03/21/2020 1026   RBC 3.44 (L) 03/21/2020 1026   HGB 9.5 (L) 03/21/2020 1026   HGB 9.6 (L) 06/27/2017 1105   HCT 30.0 (L) 03/21/2020 1026   HCT 30.1 (L) 06/27/2017 1105   PLT 278 03/21/2020 1026   PLT 304 06/27/2017 1105   MCV 87.2 03/21/2020 1026   MCV 81 06/27/2017 1105   MCH 27.6 03/21/2020 1026   MCHC 31.7 03/21/2020 1026   RDW 19.2 (H) 03/21/2020 1026   RDW 17.4 (H) 06/27/2017 1105   LYMPHSABS 2.2 01/21/2020 1431   MONOABS 0.6 01/21/2020 1431   EOSABS 0.2 01/21/2020 1431   BASOSABS 0.1 01/21/2020 1431    BMET    Component Value Date/Time   NA 140 01/23/2020 0604   NA 140 06/27/2017 1105   K 3.6 01/23/2020 0604   K 3.8 08/22/2011 0924   CL 105 01/23/2020 0604   CL 107 08/22/2011 0924   CO2 24 01/23/2020 0604   CO2 25 08/22/2011 0924   GLUCOSE 110 (H) 01/23/2020 0604   BUN 14 01/23/2020 0604   BUN 19 06/27/2017 1105   CREATININE 1.22 (H) 01/23/2020 0604   CALCIUM 8.8 (L) 01/23/2020 0604   GFRNONAA 43 (L) 01/23/2020 0604   GFRAA 50 (L) 01/23/2020 0604    BNP    Component Value Date/Time   BNP 57.0 01/21/2020 1431    ProBNP    Component Value  Date/Time   PROBNP 112.0 (H) 01/16/2010 0500    Imaging: No results found.   Assessment & Plan:   Cavitating mass in left upper lung lobe - Patient has a PET positive LUL nodule suspicious for bronchogenic carcinoma, no evidence of distant disease. She was set up for CT bx and she cancelled this the day of the procedure after being told about potential complications that could occur. We reviewed procedure in detail today and she is more understanding of what/why the procedure is being done. I think she should be able to go through with biopsy safely. She is not on oxygen and exam is benign today except for recent weight loss. She is also unsure if she will want treatment but told me she will think about it. In the mean time, I think we should get Pulmonary function testing as we do not have recent one on file. Strongly encouraged that she quit smoking! For now she has elected to monitor area with CT chest in 3 months, we ordered this as to not fall off radar. I will discuss patient with oncology and Dr. Shearon Stalls. Patient was presented at [facility], oncology's plan is still for biopsy.   Nodule of parotid gland - Following with ENT, she has upcoming CT neck w contrast with [facility]    Martyn Ehrich, NP 04/27/2020

## 2020-04-26 NOTE — Patient Instructions (Addendum)
Plan: - Strongly encourage to quit smoking- YOU CAN DO IT! - We will check pulmonary function testing for potential preop evaluation - Recommend we get a repeat CT chest with contrast in 3 months to monitor left upper lobe cavitary lesion which is suspicious for bronchogenic carcinoma - I will follow up with oncology and Dr. Shearon Stalls to see if they recommend anything further   Orders: - PFTs first available RE: pre-op evaluation/cough  - CT chest without contrast in 3 months RE: left upper lung nodule > 1cm (convert if needed to Super-D)  Follow-up: - 3 months with Dr. Shearon Stalls or sooner if breathing worse/develops blood in sputum    Pulmonary Nodule A pulmonary nodule is a small, round growth of tissue in the lung. It is sometimes referred to as a "shadow" or "spot on the lung." Nodules range in size from less than 1/5 of an inch (4 mm) to a little bigger than an inch (30 mm). Pulmonary nodules can be either noncancerous (benign) or cancerous (malignant). Most are noncancerous. Smaller nodules in people who do not smoke and do not have any other risk factors for lung cancer are more likely to be noncancerous. Larger, irregular nodules in people who smoke or who have a strong family history of lung cancer are more likely to be cancerous. What are the causes? This condition may be caused by:  A bacterial, fungal, or viral infection, such as tuberculosis. The infection is usually an old and inactive one.  A noncancerous mass of tissue.  Inflammation from conditions such as rheumatoid arthritis.  Abnormal blood vessels in the lungs.  Cancerous tissue, such as lung cancer or a cancer in another part of the body that has spread to the lung. What are the signs or symptoms? This condition usually does not cause symptoms. If symptoms appear, they are usually related to the underlying cause. For example, if the condition is caused by an infection, you may have a cough or fever. How is this  diagnosed? This condition is usually diagnosed with an X-ray or CT scan. To help determine whether a pulmonary nodule is benign or malignant, your health care provider will:  Take your medical history.  Perform a physical exam.  Order tests, including: ? Blood tests. ? A skin test called a tuberculin test. This test is done to check if you have been exposed to the germ that causes tuberculosis. ? Chest X-rays. ? A CT scan. This test shows smaller pulmonary nodules more clearly and with more detail than an X-ray. ? A positron emission tomography (PET) scan. This test is done to check if the nodule is cancerous. During the test, a safe amount of a radioactive substance is injected into the bloodstream. Then a picture is taken. ? Biopsy. In this test, a tiny piece of the pulmonary nodule is removed and then examined under a microscope. How is this treated? Treatment for this condition depends on whether the pulmonary nodule is malignant or benign as well as your risk of getting cancer.  Noncancerous nodules usually do not need to be treated, but they may need to be monitored with CT scans. If a CT scan shows that the pulmonary nodule got bigger, more tests may be done.  Some nodules need to be removed. If this is the case, you may have a procedure called a thoractomy. During the procedure, your health care provider will make an incision in your chest and remove the part of the lung where the nodule is  located. Follow these instructions at home:   Take over-the-counter and prescription medicines only as told by your health care provider.  Do not use any products that contain nicotine or tobacco, such as cigarettes and e-cigarettes. If you need help quitting, ask your health care provider.  Keep all follow-up visits as told by your health care provider. This is important. Contact a health care provider if:  You have trouble breathing when you are active.  You feel sick or unusually  tired.  You do not feel like eating.  You lose weight without trying.  You develop chills or night sweats. Get help right away if:  You cannot catch your breath.  You begin wheezing.  You cannot stop coughing.  You cough up blood.  You become dizzy or feel like you are going to faint.  You have sudden chest pain.  You have a fever or persistent symptoms for more than 2-3 days.  You have a fever and your symptoms suddenly get worse. Summary  A pulmonary nodule is a small, round growth of tissue in the lung. Most pulmonary nodules are noncancerous.  This condition is usually diagnosed with an X-ray or CT scan.  Common causes of pulmonary nodules include infection, inflammation, and noncancerous growths.  Though less common, if a nodule is found to be cancerous, you will need specific diagnostic tests and treatment options as directed by your medical provider.  Treatment for this condition depends on whether the pulmonary nodule is benign or malignant as well as your risk of getting cancer. This information is not intended to replace advice given to you by your health care provider. Make sure you discuss any questions you have with your health care provider. Document Revised: 10/25/2017 Document Reviewed: 10/30/2016 Elsevier Patient Education  [place].

## 2020-04-27 ENCOUNTER — Encounter: Payer: Self-pay | Admitting: Primary Care

## 2020-04-27 DIAGNOSIS — K118 Other diseases of salivary glands: Secondary | ICD-10-CM | POA: Insufficient documentation

## 2020-04-27 NOTE — Assessment & Plan Note (Addendum)
-   Patient has a PET positive LUL nodule suspicious for bronchogenic carcinoma, no evidence of distant disease. She was set up for CT bx and she cancelled this the day of the procedure after being told about potential complications that could occur. We reviewed procedure in detail today and she is more understanding of what/why the procedure is being done. I think she should be able to go through with biopsy safely. She is not on oxygen and exam is benign today except for recent weight loss. She is also unsure if she will want treatment but told me she will think about it. In the mean time, I think we should get Pulmonary function testing as we do not have recent one on file. Strongly encouraged that she quit smoking! For now she has elected to monitor area with CT chest in 3 months, we ordered this as to not fall off radar. I will discuss patient with oncology and Dr. Shearon Stalls. Patient was presented at [facility], oncology's plan is still for biopsy.

## 2020-04-27 NOTE — Assessment & Plan Note (Signed)
-   Following with ENT, she has upcoming CT neck w contrast with [facility]

## 2020-04-28 DIAGNOSIS — K922 Gastrointestinal hemorrhage, unspecified: Secondary | ICD-10-CM | POA: Diagnosis not present

## 2020-04-28 DIAGNOSIS — J449 Chronic obstructive pulmonary disease, unspecified: Secondary | ICD-10-CM | POA: Diagnosis not present

## 2020-04-28 DIAGNOSIS — I70209 Unspecified atherosclerosis of native arteries of extremities, unspecified extremity: Secondary | ICD-10-CM | POA: Diagnosis not present

## 2020-04-28 DIAGNOSIS — I6523 Occlusion and stenosis of bilateral carotid arteries: Secondary | ICD-10-CM | POA: Diagnosis not present

## 2020-04-28 DIAGNOSIS — R918 Other nonspecific abnormal finding of lung field: Secondary | ICD-10-CM | POA: Diagnosis not present

## 2020-04-28 DIAGNOSIS — D509 Iron deficiency anemia, unspecified: Secondary | ICD-10-CM | POA: Diagnosis not present

## 2020-04-28 DIAGNOSIS — I10 Essential (primary) hypertension: Secondary | ICD-10-CM | POA: Diagnosis not present

## 2020-04-28 DIAGNOSIS — E1151 Type 2 diabetes mellitus with diabetic peripheral angiopathy without gangrene: Secondary | ICD-10-CM | POA: Diagnosis not present

## 2020-04-28 DIAGNOSIS — E1140 Type 2 diabetes mellitus with diabetic neuropathy, unspecified: Secondary | ICD-10-CM | POA: Diagnosis not present

## 2020-05-02 DIAGNOSIS — D509 Iron deficiency anemia, unspecified: Secondary | ICD-10-CM | POA: Diagnosis not present

## 2020-05-02 DIAGNOSIS — I70209 Unspecified atherosclerosis of native arteries of extremities, unspecified extremity: Secondary | ICD-10-CM | POA: Diagnosis not present

## 2020-05-02 DIAGNOSIS — I6523 Occlusion and stenosis of bilateral carotid arteries: Secondary | ICD-10-CM | POA: Diagnosis not present

## 2020-05-02 DIAGNOSIS — J449 Chronic obstructive pulmonary disease, unspecified: Secondary | ICD-10-CM | POA: Diagnosis not present

## 2020-05-02 DIAGNOSIS — E1151 Type 2 diabetes mellitus with diabetic peripheral angiopathy without gangrene: Secondary | ICD-10-CM | POA: Diagnosis not present

## 2020-05-02 DIAGNOSIS — K922 Gastrointestinal hemorrhage, unspecified: Secondary | ICD-10-CM | POA: Diagnosis not present

## 2020-05-02 DIAGNOSIS — R918 Other nonspecific abnormal finding of lung field: Secondary | ICD-10-CM | POA: Diagnosis not present

## 2020-05-02 DIAGNOSIS — I10 Essential (primary) hypertension: Secondary | ICD-10-CM | POA: Diagnosis not present

## 2020-05-02 DIAGNOSIS — E1140 Type 2 diabetes mellitus with diabetic neuropathy, unspecified: Secondary | ICD-10-CM | POA: Diagnosis not present

## 2020-05-03 DIAGNOSIS — I10 Essential (primary) hypertension: Secondary | ICD-10-CM | POA: Diagnosis not present

## 2020-05-03 DIAGNOSIS — I6523 Occlusion and stenosis of bilateral carotid arteries: Secondary | ICD-10-CM | POA: Diagnosis not present

## 2020-05-03 DIAGNOSIS — R918 Other nonspecific abnormal finding of lung field: Secondary | ICD-10-CM | POA: Diagnosis not present

## 2020-05-03 DIAGNOSIS — D509 Iron deficiency anemia, unspecified: Secondary | ICD-10-CM | POA: Diagnosis not present

## 2020-05-03 DIAGNOSIS — K922 Gastrointestinal hemorrhage, unspecified: Secondary | ICD-10-CM | POA: Diagnosis not present

## 2020-05-03 DIAGNOSIS — I70209 Unspecified atherosclerosis of native arteries of extremities, unspecified extremity: Secondary | ICD-10-CM | POA: Diagnosis not present

## 2020-05-03 DIAGNOSIS — J449 Chronic obstructive pulmonary disease, unspecified: Secondary | ICD-10-CM | POA: Diagnosis not present

## 2020-05-03 DIAGNOSIS — E1151 Type 2 diabetes mellitus with diabetic peripheral angiopathy without gangrene: Secondary | ICD-10-CM | POA: Diagnosis not present

## 2020-05-03 DIAGNOSIS — E1140 Type 2 diabetes mellitus with diabetic neuropathy, unspecified: Secondary | ICD-10-CM | POA: Diagnosis not present

## 2020-05-04 ENCOUNTER — Ambulatory Visit
Admission: RE | Admit: 2020-05-04 | Discharge: 2020-05-04 | Disposition: A | Discharge status: Home / Self Care | Payer: Medicare HMO | Source: Ambulatory Visit | Attending: Otolaryngology | Admitting: Otolaryngology

## 2020-05-04 DIAGNOSIS — K118 Other diseases of salivary glands: Secondary | ICD-10-CM

## 2020-05-05 ENCOUNTER — Telehealth: Payer: Self-pay

## 2020-05-05 DIAGNOSIS — I70209 Unspecified atherosclerosis of native arteries of extremities, unspecified extremity: Secondary | ICD-10-CM | POA: Diagnosis not present

## 2020-05-05 DIAGNOSIS — R918 Other nonspecific abnormal finding of lung field: Secondary | ICD-10-CM | POA: Diagnosis not present

## 2020-05-05 DIAGNOSIS — D509 Iron deficiency anemia, unspecified: Secondary | ICD-10-CM | POA: Diagnosis not present

## 2020-05-05 DIAGNOSIS — I6523 Occlusion and stenosis of bilateral carotid arteries: Secondary | ICD-10-CM | POA: Diagnosis not present

## 2020-05-05 DIAGNOSIS — E1140 Type 2 diabetes mellitus with diabetic neuropathy, unspecified: Secondary | ICD-10-CM | POA: Diagnosis not present

## 2020-05-05 DIAGNOSIS — K922 Gastrointestinal hemorrhage, unspecified: Secondary | ICD-10-CM | POA: Diagnosis not present

## 2020-05-05 DIAGNOSIS — E1151 Type 2 diabetes mellitus with diabetic peripheral angiopathy without gangrene: Secondary | ICD-10-CM | POA: Diagnosis not present

## 2020-05-05 DIAGNOSIS — I10 Essential (primary) hypertension: Secondary | ICD-10-CM | POA: Diagnosis not present

## 2020-05-05 DIAGNOSIS — J449 Chronic obstructive pulmonary disease, unspecified: Secondary | ICD-10-CM | POA: Diagnosis not present

## 2020-05-05 NOTE — Telephone Encounter (Signed)
Spoke with patient's daughter after calling in 13-hr prep to [employer].  She stated an understanding (w/ correct readback) that her mom is to take Prednisone 50mg  PO 05/19/20 @ 0130, 0730 and 1330, as well as Benadryl 50mg  PO @ 1330.  Confirmed arrival time versus table time.

## 2020-05-06 ENCOUNTER — Ambulatory Visit (HOSPITAL_COMMUNITY): Admission: RE | Admit: 2020-05-06 | Payer: Medicare HMO | Source: Ambulatory Visit

## 2020-05-06 DIAGNOSIS — R531 Weakness: Secondary | ICD-10-CM | POA: Diagnosis not present

## 2020-05-06 DIAGNOSIS — E119 Type 2 diabetes mellitus without complications: Secondary | ICD-10-CM | POA: Diagnosis not present

## 2020-05-06 DIAGNOSIS — E11649 Type 2 diabetes mellitus with hypoglycemia without coma: Secondary | ICD-10-CM | POA: Diagnosis not present

## 2020-05-06 DIAGNOSIS — E860 Dehydration: Secondary | ICD-10-CM | POA: Diagnosis not present

## 2020-05-06 DIAGNOSIS — I509 Heart failure, unspecified: Secondary | ICD-10-CM | POA: Diagnosis not present

## 2020-05-06 DIAGNOSIS — E7800 Pure hypercholesterolemia, unspecified: Secondary | ICD-10-CM | POA: Diagnosis not present

## 2020-05-06 DIAGNOSIS — I110 Hypertensive heart disease with heart failure: Secondary | ICD-10-CM | POA: Diagnosis not present

## 2020-05-06 DIAGNOSIS — I959 Hypotension, unspecified: Secondary | ICD-10-CM | POA: Diagnosis not present

## 2020-05-06 DIAGNOSIS — N390 Urinary tract infection, site not specified: Secondary | ICD-10-CM | POA: Diagnosis not present

## 2020-05-09 ENCOUNTER — Encounter: Payer: Self-pay | Admitting: Internal Medicine

## 2020-05-10 ENCOUNTER — Telehealth: Payer: Self-pay | Admitting: Primary Care

## 2020-05-10 DIAGNOSIS — I6523 Occlusion and stenosis of bilateral carotid arteries: Secondary | ICD-10-CM | POA: Diagnosis not present

## 2020-05-10 DIAGNOSIS — K922 Gastrointestinal hemorrhage, unspecified: Secondary | ICD-10-CM | POA: Diagnosis not present

## 2020-05-10 DIAGNOSIS — D509 Iron deficiency anemia, unspecified: Secondary | ICD-10-CM | POA: Diagnosis not present

## 2020-05-10 DIAGNOSIS — J449 Chronic obstructive pulmonary disease, unspecified: Secondary | ICD-10-CM | POA: Diagnosis not present

## 2020-05-10 DIAGNOSIS — R918 Other nonspecific abnormal finding of lung field: Secondary | ICD-10-CM | POA: Diagnosis not present

## 2020-05-10 DIAGNOSIS — E1140 Type 2 diabetes mellitus with diabetic neuropathy, unspecified: Secondary | ICD-10-CM | POA: Diagnosis not present

## 2020-05-10 DIAGNOSIS — I70209 Unspecified atherosclerosis of native arteries of extremities, unspecified extremity: Secondary | ICD-10-CM | POA: Diagnosis not present

## 2020-05-10 DIAGNOSIS — E1151 Type 2 diabetes mellitus with diabetic peripheral angiopathy without gangrene: Secondary | ICD-10-CM | POA: Diagnosis not present

## 2020-05-10 DIAGNOSIS — I10 Essential (primary) hypertension: Secondary | ICD-10-CM | POA: Diagnosis not present

## 2020-05-10 NOTE — Telephone Encounter (Signed)
Provided [facility] # to reschedule this appointment.

## 2020-05-11 DIAGNOSIS — Z6822 Body mass index (BMI) 22.0-22.9, adult: Secondary | ICD-10-CM | POA: Diagnosis not present

## 2020-05-11 DIAGNOSIS — R2689 Other abnormalities of gait and mobility: Secondary | ICD-10-CM | POA: Diagnosis not present

## 2020-05-11 DIAGNOSIS — R55 Syncope and collapse: Secondary | ICD-10-CM | POA: Diagnosis not present

## 2020-05-11 DIAGNOSIS — I7025 Atherosclerosis of native arteries of other extremities with ulceration: Secondary | ICD-10-CM | POA: Diagnosis not present

## 2020-05-12 DIAGNOSIS — I70209 Unspecified atherosclerosis of native arteries of extremities, unspecified extremity: Secondary | ICD-10-CM | POA: Diagnosis not present

## 2020-05-12 DIAGNOSIS — D509 Iron deficiency anemia, unspecified: Secondary | ICD-10-CM | POA: Diagnosis not present

## 2020-05-12 DIAGNOSIS — K922 Gastrointestinal hemorrhage, unspecified: Secondary | ICD-10-CM | POA: Diagnosis not present

## 2020-05-12 DIAGNOSIS — I6523 Occlusion and stenosis of bilateral carotid arteries: Secondary | ICD-10-CM | POA: Diagnosis not present

## 2020-05-12 DIAGNOSIS — E1140 Type 2 diabetes mellitus with diabetic neuropathy, unspecified: Secondary | ICD-10-CM | POA: Diagnosis not present

## 2020-05-12 DIAGNOSIS — E1151 Type 2 diabetes mellitus with diabetic peripheral angiopathy without gangrene: Secondary | ICD-10-CM | POA: Diagnosis not present

## 2020-05-12 DIAGNOSIS — R918 Other nonspecific abnormal finding of lung field: Secondary | ICD-10-CM | POA: Diagnosis not present

## 2020-05-12 DIAGNOSIS — I10 Essential (primary) hypertension: Secondary | ICD-10-CM | POA: Diagnosis not present

## 2020-05-12 DIAGNOSIS — J449 Chronic obstructive pulmonary disease, unspecified: Secondary | ICD-10-CM | POA: Diagnosis not present

## 2020-05-13 DIAGNOSIS — E1122 Type 2 diabetes mellitus with diabetic chronic kidney disease: Secondary | ICD-10-CM | POA: Diagnosis not present

## 2020-05-13 DIAGNOSIS — Z89431 Acquired absence of right foot: Secondary | ICD-10-CM | POA: Diagnosis not present

## 2020-05-13 DIAGNOSIS — Z794 Long term (current) use of insulin: Secondary | ICD-10-CM | POA: Diagnosis not present

## 2020-05-13 DIAGNOSIS — I5032 Chronic diastolic (congestive) heart failure: Secondary | ICD-10-CM | POA: Diagnosis not present

## 2020-05-13 DIAGNOSIS — I130 Hypertensive heart and chronic kidney disease with heart failure and stage 1 through stage 4 chronic kidney disease, or unspecified chronic kidney disease: Secondary | ICD-10-CM | POA: Diagnosis not present

## 2020-05-16 DIAGNOSIS — I10 Essential (primary) hypertension: Secondary | ICD-10-CM | POA: Diagnosis not present

## 2020-05-16 DIAGNOSIS — G40109 Localization-related (focal) (partial) symptomatic epilepsy and epileptic syndromes with simple partial seizures, not intractable, without status epilepticus: Secondary | ICD-10-CM | POA: Diagnosis not present

## 2020-05-16 DIAGNOSIS — I952 Hypotension due to drugs: Secondary | ICD-10-CM | POA: Diagnosis not present

## 2020-05-16 DIAGNOSIS — G894 Chronic pain syndrome: Secondary | ICD-10-CM | POA: Diagnosis not present

## 2020-05-16 DIAGNOSIS — E1151 Type 2 diabetes mellitus with diabetic peripheral angiopathy without gangrene: Secondary | ICD-10-CM | POA: Diagnosis not present

## 2020-05-16 DIAGNOSIS — G459 Transient cerebral ischemic attack, unspecified: Secondary | ICD-10-CM | POA: Diagnosis not present

## 2020-05-17 DIAGNOSIS — E1140 Type 2 diabetes mellitus with diabetic neuropathy, unspecified: Secondary | ICD-10-CM | POA: Diagnosis not present

## 2020-05-17 DIAGNOSIS — I10 Essential (primary) hypertension: Secondary | ICD-10-CM | POA: Diagnosis not present

## 2020-05-17 DIAGNOSIS — D509 Iron deficiency anemia, unspecified: Secondary | ICD-10-CM | POA: Diagnosis not present

## 2020-05-17 DIAGNOSIS — J449 Chronic obstructive pulmonary disease, unspecified: Secondary | ICD-10-CM | POA: Diagnosis not present

## 2020-05-17 DIAGNOSIS — K922 Gastrointestinal hemorrhage, unspecified: Secondary | ICD-10-CM | POA: Diagnosis not present

## 2020-05-17 DIAGNOSIS — I6523 Occlusion and stenosis of bilateral carotid arteries: Secondary | ICD-10-CM | POA: Diagnosis not present

## 2020-05-17 DIAGNOSIS — R918 Other nonspecific abnormal finding of lung field: Secondary | ICD-10-CM | POA: Diagnosis not present

## 2020-05-17 DIAGNOSIS — I70209 Unspecified atherosclerosis of native arteries of extremities, unspecified extremity: Secondary | ICD-10-CM | POA: Diagnosis not present

## 2020-05-17 DIAGNOSIS — E1151 Type 2 diabetes mellitus with diabetic peripheral angiopathy without gangrene: Secondary | ICD-10-CM | POA: Diagnosis not present

## 2020-05-18 DIAGNOSIS — I6523 Occlusion and stenosis of bilateral carotid arteries: Secondary | ICD-10-CM | POA: Diagnosis not present

## 2020-05-18 DIAGNOSIS — K922 Gastrointestinal hemorrhage, unspecified: Secondary | ICD-10-CM | POA: Diagnosis not present

## 2020-05-18 DIAGNOSIS — I10 Essential (primary) hypertension: Secondary | ICD-10-CM | POA: Diagnosis not present

## 2020-05-18 DIAGNOSIS — J449 Chronic obstructive pulmonary disease, unspecified: Secondary | ICD-10-CM | POA: Diagnosis not present

## 2020-05-18 DIAGNOSIS — I70209 Unspecified atherosclerosis of native arteries of extremities, unspecified extremity: Secondary | ICD-10-CM | POA: Diagnosis not present

## 2020-05-18 DIAGNOSIS — D509 Iron deficiency anemia, unspecified: Secondary | ICD-10-CM | POA: Diagnosis not present

## 2020-05-18 DIAGNOSIS — E1151 Type 2 diabetes mellitus with diabetic peripheral angiopathy without gangrene: Secondary | ICD-10-CM | POA: Diagnosis not present

## 2020-05-18 DIAGNOSIS — E1140 Type 2 diabetes mellitus with diabetic neuropathy, unspecified: Secondary | ICD-10-CM | POA: Diagnosis not present

## 2020-05-18 DIAGNOSIS — R918 Other nonspecific abnormal finding of lung field: Secondary | ICD-10-CM | POA: Diagnosis not present

## 2020-05-19 ENCOUNTER — Ambulatory Visit: Admission: RE | Admit: 2020-05-19 | Payer: Medicare HMO | Source: Ambulatory Visit

## 2020-05-19 ENCOUNTER — Ambulatory Visit
Admission: RE | Admit: 2020-05-19 | Discharge: 2020-05-19 | Disposition: A | Discharge status: Home / Self Care | Payer: Medicare HMO | Source: Ambulatory Visit | Attending: Primary Care | Admitting: Primary Care

## 2020-05-19 DIAGNOSIS — I700 Atherosclerosis of aorta: Secondary | ICD-10-CM | POA: Diagnosis not present

## 2020-05-19 DIAGNOSIS — J439 Emphysema, unspecified: Secondary | ICD-10-CM | POA: Diagnosis not present

## 2020-05-19 DIAGNOSIS — I2510 Atherosclerotic heart disease of native coronary artery without angina pectoris: Secondary | ICD-10-CM | POA: Diagnosis not present

## 2020-05-19 DIAGNOSIS — R911 Solitary pulmonary nodule: Secondary | ICD-10-CM | POA: Diagnosis not present

## 2020-05-20 ENCOUNTER — Telehealth: Payer: Self-pay | Admitting: Primary Care

## 2020-05-20 NOTE — Telephone Encounter (Signed)
Patient needs office visit to discuss results.  Previously seen Volanda Napoleon NP as had been recommended to have lung mass biopsy.  Please call patient to set up follow-up as discussed test results

## 2020-05-20 NOTE — Telephone Encounter (Signed)
See report from [facility] Radiology. CT scan of chest. Report called at 0900.  IMPRESSION: 1. 2.6 x 2.1 cm spiculated cavitary abnormality is noted in the left upper lobe consistent with malignancy as described on prior PET scan and CT scan. It appears to be enlarged compared to prior exam. These results will be called to the ordering clinician or representative by the Radiologist Assistant, and communication documented in the PACS or zVision Dashboard. 2. Coronary artery calcifications are noted suggesting coronary artery disease.  Aortic Atherosclerosis (ICD10-I70.0) and Emphysema (ICD10-J43.9).

## 2020-05-22 DIAGNOSIS — I10 Essential (primary) hypertension: Secondary | ICD-10-CM | POA: Diagnosis not present

## 2020-05-22 DIAGNOSIS — I70209 Unspecified atherosclerosis of native arteries of extremities, unspecified extremity: Secondary | ICD-10-CM | POA: Diagnosis not present

## 2020-05-22 DIAGNOSIS — E1151 Type 2 diabetes mellitus with diabetic peripheral angiopathy without gangrene: Secondary | ICD-10-CM | POA: Diagnosis not present

## 2020-05-22 DIAGNOSIS — D509 Iron deficiency anemia, unspecified: Secondary | ICD-10-CM | POA: Diagnosis not present

## 2020-05-22 DIAGNOSIS — K922 Gastrointestinal hemorrhage, unspecified: Secondary | ICD-10-CM | POA: Diagnosis not present

## 2020-05-22 DIAGNOSIS — E1140 Type 2 diabetes mellitus with diabetic neuropathy, unspecified: Secondary | ICD-10-CM | POA: Diagnosis not present

## 2020-05-22 DIAGNOSIS — J449 Chronic obstructive pulmonary disease, unspecified: Secondary | ICD-10-CM | POA: Diagnosis not present

## 2020-05-22 DIAGNOSIS — I6523 Occlusion and stenosis of bilateral carotid arteries: Secondary | ICD-10-CM | POA: Diagnosis not present

## 2020-05-22 DIAGNOSIS — R918 Other nonspecific abnormal finding of lung field: Secondary | ICD-10-CM | POA: Diagnosis not present

## 2020-05-23 NOTE — Telephone Encounter (Signed)
ATC, VM full, unable to leave a message.  Will try again another time.

## 2020-05-23 NOTE — Telephone Encounter (Signed)
Please scheduled patient office visit to discuss CT results

## 2020-05-24 DIAGNOSIS — I70209 Unspecified atherosclerosis of native arteries of extremities, unspecified extremity: Secondary | ICD-10-CM | POA: Diagnosis not present

## 2020-05-24 DIAGNOSIS — I6523 Occlusion and stenosis of bilateral carotid arteries: Secondary | ICD-10-CM | POA: Diagnosis not present

## 2020-05-24 DIAGNOSIS — D509 Iron deficiency anemia, unspecified: Secondary | ICD-10-CM | POA: Diagnosis not present

## 2020-05-24 DIAGNOSIS — E1140 Type 2 diabetes mellitus with diabetic neuropathy, unspecified: Secondary | ICD-10-CM | POA: Diagnosis not present

## 2020-05-24 DIAGNOSIS — E1151 Type 2 diabetes mellitus with diabetic peripheral angiopathy without gangrene: Secondary | ICD-10-CM | POA: Diagnosis not present

## 2020-05-24 DIAGNOSIS — K922 Gastrointestinal hemorrhage, unspecified: Secondary | ICD-10-CM | POA: Diagnosis not present

## 2020-05-24 DIAGNOSIS — I10 Essential (primary) hypertension: Secondary | ICD-10-CM | POA: Diagnosis not present

## 2020-05-24 DIAGNOSIS — J449 Chronic obstructive pulmonary disease, unspecified: Secondary | ICD-10-CM | POA: Diagnosis not present

## 2020-05-24 DIAGNOSIS — R918 Other nonspecific abnormal finding of lung field: Secondary | ICD-10-CM | POA: Diagnosis not present

## 2020-05-24 NOTE — Telephone Encounter (Signed)
ATC pt, there was no answer and her VM was full. Will try back.

## 2020-05-25 ENCOUNTER — Encounter: Payer: Self-pay | Admitting: Internal Medicine

## 2020-05-25 ENCOUNTER — Ambulatory Visit: Payer: Medicare HMO | Admitting: Internal Medicine

## 2020-05-25 NOTE — Telephone Encounter (Signed)
ATC pt, there was no answer and her VM was full. We have attempted to contact pt several times with no success or call back from pt. Per triage protocol, message will be closed.

## 2020-05-31 ENCOUNTER — Ambulatory Visit (HOSPITAL_COMMUNITY)
Admission: RE | Admit: 2020-05-31 | Discharge: 2020-05-31 | Disposition: A | Discharge status: Home / Self Care | Payer: Medicare HMO | Source: Ambulatory Visit | Attending: Neurology | Admitting: Neurology

## 2020-05-31 ENCOUNTER — Other Ambulatory Visit: Payer: Self-pay

## 2020-05-31 DIAGNOSIS — Z7982 Long term (current) use of aspirin: Secondary | ICD-10-CM | POA: Insufficient documentation

## 2020-05-31 DIAGNOSIS — Z79899 Other long term (current) drug therapy: Secondary | ICD-10-CM | POA: Diagnosis not present

## 2020-05-31 DIAGNOSIS — G40201 Localization-related (focal) (partial) symptomatic epilepsy and epileptic syndromes with complex partial seizures, not intractable, with status epilepticus: Secondary | ICD-10-CM | POA: Insufficient documentation

## 2020-05-31 DIAGNOSIS — R569 Unspecified convulsions: Secondary | ICD-10-CM | POA: Diagnosis not present

## 2020-05-31 NOTE — Progress Notes (Signed)
OP EEG completed, results pending. Dr. Merlene Laughter notified.

## 2020-05-31 NOTE — Procedures (Signed)
  [place] A. Merlene Laughter, MD     [email or web address]           HISTORY: The patient presents with episodes of there are concerning for seizures.  MEDICATIONS:  Current Outpatient Medications:  .  albuterol (PROVENTIL HFA;VENTOLIN HFA) 108 (90 BASE) MCG/ACT inhaler, Inhale 2 puffs into the lungs every 6 (six) hours as needed. For bronchitis and coughing, Disp: , Rfl:  .  albuterol (PROVENTIL) (2.5 MG/3ML) 0.083% nebulizer solution, Take 3 mLs (2.5 mg total) by nebulization every 6 (six) hours as needed for wheezing or shortness of breath., Disp: 75 mL, Rfl: 12 .  alendronate (FOSAMAX) 70 MG tablet, Take 70 mg by mouth every 7 (seven) days. On Mondays, Disp: , Rfl:  .  ALPRAZolam (XANAX) 0.5 MG tablet, Take 0.5 mg by mouth 3 (three) times daily., Disp: , Rfl:  .  amLODipine (NORVASC) 5 MG tablet, Take 1 tablet (5 mg total) by mouth daily., Disp: 30 tablet, Rfl: 0 .  aspirin 81 MG chewable tablet, Chew 1 tablet (81 mg total) by mouth daily., Disp: 30 tablet, Rfl: 0 .  atorvastatin (LIPITOR) 20 MG tablet, Take 1 tablet (20 mg total) by mouth at bedtime. (Patient taking differently: Take 20 mg by mouth daily. ), Disp: 30 tablet, Rfl: 0 .  clopidogrel (PLAVIX) 75 MG tablet, Take 75 mg by mouth daily.  , Disp: , Rfl:  .  donepezil (ARICEPT) 5 MG tablet, , Disp: , Rfl:  .  dronabinol (MARINOL) 5 MG capsule, , Disp: , Rfl:  .  ferrous sulfate 325 (65 FE) MG tablet, Take 1 tablet (325 mg total) by mouth daily with breakfast., Disp: , Rfl: 3 .  furosemide (LASIX) 20 MG tablet, Take 20 mg by mouth every morning., Disp: , Rfl:  .  insulin glargine (LANTUS) 100 UNIT/ML injection, Inject 0.2 mLs (20 Units total) into the skin at bedtime. (Patient taking differently: Inject 30 Units into the skin at bedtime. ), Disp: 10 mL, Rfl: 11 .  Iron-Vitamins (GERITOL COMPLETE PO), Take 1 tablet by mouth daily. , Disp: , Rfl:  .  labetalol (NORMODYNE) 300 MG tablet, Take 1 tablet (300 mg total) by  mouth 2 (two) times daily., Disp: 60 tablet, Rfl: 0 .  latanoprost (XALATAN) 0.005 % ophthalmic solution, Place 1 drop into both eyes at bedtime. , Disp: , Rfl:  .  Omega-3 Fatty Acids (FISH OIL) 1000 MG CPDR, Take 1,000 mg by mouth daily. , Disp: , Rfl:  .  varenicline (CHANTIX CONTINUING MONTH PAK) 1 MG tablet, Take 1 tablet (1 mg total) by mouth 2 (two) times daily., Disp: 60 tablet, Rfl: 5     ANALYSIS: A 16 channel recording using standard 10 20 measurements is conducted for 26 minutes.  There is a well-formed posterior dominant rhythm of 14 Hz which attenuates with eye opening.  There is beta activity observed in frontal areas.  The recording is significantly degraded with the myogenic and motion artifact especially involving the frontal areas bilaterally more on the right side.  Photic stimulation and hyperventilation are not conducted.  There is no focal or lateral slowing.  There is no epileptiform activity is noted.   IMPRESSION: 1.  This recording is unremarkable.      Sherri Brooks, M.D.  Diplomate, Tax adviser of Psychiatry and Neurology ( Neurology).

## 2020-06-03 ENCOUNTER — Other Ambulatory Visit: Payer: Self-pay

## 2020-06-03 DIAGNOSIS — E1151 Type 2 diabetes mellitus with diabetic peripheral angiopathy without gangrene: Secondary | ICD-10-CM | POA: Diagnosis not present

## 2020-06-03 DIAGNOSIS — K922 Gastrointestinal hemorrhage, unspecified: Secondary | ICD-10-CM | POA: Diagnosis not present

## 2020-06-03 DIAGNOSIS — I6523 Occlusion and stenosis of bilateral carotid arteries: Secondary | ICD-10-CM | POA: Diagnosis not present

## 2020-06-03 DIAGNOSIS — I10 Essential (primary) hypertension: Secondary | ICD-10-CM | POA: Diagnosis not present

## 2020-06-03 DIAGNOSIS — I739 Peripheral vascular disease, unspecified: Secondary | ICD-10-CM

## 2020-06-03 DIAGNOSIS — J449 Chronic obstructive pulmonary disease, unspecified: Secondary | ICD-10-CM | POA: Diagnosis not present

## 2020-06-03 DIAGNOSIS — R918 Other nonspecific abnormal finding of lung field: Secondary | ICD-10-CM | POA: Diagnosis not present

## 2020-06-03 DIAGNOSIS — E1140 Type 2 diabetes mellitus with diabetic neuropathy, unspecified: Secondary | ICD-10-CM | POA: Diagnosis not present

## 2020-06-03 DIAGNOSIS — I70209 Unspecified atherosclerosis of native arteries of extremities, unspecified extremity: Secondary | ICD-10-CM | POA: Diagnosis not present

## 2020-06-03 DIAGNOSIS — I6529 Occlusion and stenosis of unspecified carotid artery: Secondary | ICD-10-CM

## 2020-06-03 DIAGNOSIS — D509 Iron deficiency anemia, unspecified: Secondary | ICD-10-CM | POA: Diagnosis not present

## 2020-06-07 DIAGNOSIS — K922 Gastrointestinal hemorrhage, unspecified: Secondary | ICD-10-CM | POA: Diagnosis not present

## 2020-06-07 DIAGNOSIS — I70209 Unspecified atherosclerosis of native arteries of extremities, unspecified extremity: Secondary | ICD-10-CM | POA: Diagnosis not present

## 2020-06-07 DIAGNOSIS — D509 Iron deficiency anemia, unspecified: Secondary | ICD-10-CM | POA: Diagnosis not present

## 2020-06-07 DIAGNOSIS — J449 Chronic obstructive pulmonary disease, unspecified: Secondary | ICD-10-CM | POA: Diagnosis not present

## 2020-06-07 DIAGNOSIS — E1140 Type 2 diabetes mellitus with diabetic neuropathy, unspecified: Secondary | ICD-10-CM | POA: Diagnosis not present

## 2020-06-07 DIAGNOSIS — R918 Other nonspecific abnormal finding of lung field: Secondary | ICD-10-CM | POA: Diagnosis not present

## 2020-06-07 DIAGNOSIS — I6523 Occlusion and stenosis of bilateral carotid arteries: Secondary | ICD-10-CM | POA: Diagnosis not present

## 2020-06-07 DIAGNOSIS — E1151 Type 2 diabetes mellitus with diabetic peripheral angiopathy without gangrene: Secondary | ICD-10-CM | POA: Diagnosis not present

## 2020-06-07 DIAGNOSIS — I10 Essential (primary) hypertension: Secondary | ICD-10-CM | POA: Diagnosis not present

## 2020-06-11 DIAGNOSIS — R55 Syncope and collapse: Secondary | ICD-10-CM | POA: Diagnosis not present

## 2020-06-11 DIAGNOSIS — R2689 Other abnormalities of gait and mobility: Secondary | ICD-10-CM | POA: Diagnosis not present

## 2020-06-11 DIAGNOSIS — I7025 Atherosclerosis of native arteries of other extremities with ulceration: Secondary | ICD-10-CM | POA: Diagnosis not present

## 2020-06-11 DIAGNOSIS — Z6822 Body mass index (BMI) 22.0-22.9, adult: Secondary | ICD-10-CM | POA: Diagnosis not present

## 2020-06-14 DIAGNOSIS — R918 Other nonspecific abnormal finding of lung field: Secondary | ICD-10-CM | POA: Diagnosis not present

## 2020-06-14 DIAGNOSIS — J449 Chronic obstructive pulmonary disease, unspecified: Secondary | ICD-10-CM | POA: Diagnosis not present

## 2020-06-14 DIAGNOSIS — I70209 Unspecified atherosclerosis of native arteries of extremities, unspecified extremity: Secondary | ICD-10-CM | POA: Diagnosis not present

## 2020-06-14 DIAGNOSIS — Z794 Long term (current) use of insulin: Secondary | ICD-10-CM | POA: Diagnosis not present

## 2020-06-14 DIAGNOSIS — E1122 Type 2 diabetes mellitus with diabetic chronic kidney disease: Secondary | ICD-10-CM | POA: Diagnosis not present

## 2020-06-14 DIAGNOSIS — I6523 Occlusion and stenosis of bilateral carotid arteries: Secondary | ICD-10-CM | POA: Diagnosis not present

## 2020-06-14 DIAGNOSIS — D509 Iron deficiency anemia, unspecified: Secondary | ICD-10-CM | POA: Diagnosis not present

## 2020-06-14 DIAGNOSIS — I10 Essential (primary) hypertension: Secondary | ICD-10-CM | POA: Diagnosis not present

## 2020-06-14 DIAGNOSIS — I130 Hypertensive heart and chronic kidney disease with heart failure and stage 1 through stage 4 chronic kidney disease, or unspecified chronic kidney disease: Secondary | ICD-10-CM | POA: Diagnosis not present

## 2020-06-14 DIAGNOSIS — E1151 Type 2 diabetes mellitus with diabetic peripheral angiopathy without gangrene: Secondary | ICD-10-CM | POA: Diagnosis not present

## 2020-06-14 DIAGNOSIS — E1140 Type 2 diabetes mellitus with diabetic neuropathy, unspecified: Secondary | ICD-10-CM | POA: Diagnosis not present

## 2020-06-14 DIAGNOSIS — I5032 Chronic diastolic (congestive) heart failure: Secondary | ICD-10-CM | POA: Diagnosis not present

## 2020-06-14 DIAGNOSIS — K922 Gastrointestinal hemorrhage, unspecified: Secondary | ICD-10-CM | POA: Diagnosis not present

## 2020-06-15 ENCOUNTER — Other Ambulatory Visit: Payer: Self-pay

## 2020-06-15 ENCOUNTER — Ambulatory Visit (HOSPITAL_COMMUNITY)
Admission: RE | Admit: 2020-06-15 | Discharge: 2020-06-15 | Disposition: A | Discharge status: Home / Self Care | Payer: Medicare HMO | Source: Ambulatory Visit | Attending: Vascular Surgery | Admitting: Vascular Surgery

## 2020-06-15 ENCOUNTER — Encounter: Payer: Self-pay | Admitting: Vascular Surgery

## 2020-06-15 ENCOUNTER — Ambulatory Visit: Payer: Medicare HMO | Admitting: Vascular Surgery

## 2020-06-15 ENCOUNTER — Ambulatory Visit (INDEPENDENT_AMBULATORY_CARE_PROVIDER_SITE_OTHER)
Admission: RE | Admit: 2020-06-15 | Discharge: 2020-06-15 | Disposition: A | Discharge status: Home / Self Care | Payer: Medicare HMO | Source: Ambulatory Visit | Attending: Vascular Surgery | Admitting: Vascular Surgery

## 2020-06-15 VITALS — BP 98/58 | HR 66 | Temp 97.3°F | Resp 20 | Ht 63.0 in | Wt 120.0 lb

## 2020-06-15 DIAGNOSIS — I6529 Occlusion and stenosis of unspecified carotid artery: Secondary | ICD-10-CM

## 2020-06-15 DIAGNOSIS — I739 Peripheral vascular disease, unspecified: Secondary | ICD-10-CM | POA: Insufficient documentation

## 2020-06-15 DIAGNOSIS — R404 Transient alteration of awareness: Secondary | ICD-10-CM | POA: Diagnosis not present

## 2020-06-15 DIAGNOSIS — I6523 Occlusion and stenosis of bilateral carotid arteries: Secondary | ICD-10-CM | POA: Diagnosis not present

## 2020-06-15 DIAGNOSIS — R4182 Altered mental status, unspecified: Secondary | ICD-10-CM | POA: Diagnosis not present

## 2020-06-15 DIAGNOSIS — R0902 Hypoxemia: Secondary | ICD-10-CM | POA: Diagnosis not present

## 2020-06-15 DIAGNOSIS — E161 Other hypoglycemia: Secondary | ICD-10-CM | POA: Diagnosis not present

## 2020-06-15 DIAGNOSIS — E162 Hypoglycemia, unspecified: Secondary | ICD-10-CM | POA: Diagnosis not present

## 2020-06-15 NOTE — Progress Notes (Signed)
REASON FOR VISIT:   Right carotid stenosis  MEDICAL ISSUES:   RIGHT CAROTID STENOSIS: This patient has a greater than 80% right carotid stenosis.  Based on my history it sounds like this is asymptomatic.  However given the severity of stenosis I would recommend right carotid endarterectomy in order to lower her risk of future stroke.  She has a significant cardiac history and will need preoperative cardiac clearance.  In addition, given the surge in Covid cases at the hospital we are trying to delay elective surgery until later.  She is on aspirin, Plavix, and a statin.  I have discussed the indications for right carotid endarterectomy and the potential complications including but not limited to stroke (perioperative risk 1 to 2%), MI, nerve injury, or other unpredictable medical problems.  If she is felt to be too high risk for surgery then I would obtain a CT angiogram of the neck and determine if she might be a candidate for transcarotid stenting which could potentially be done under local with some sedation.  PERIPHERAL VASCULAR DISEASE: This patient has evidence of severe infrainguinal arterial occlusive disease on the right with a small dark area on her right great toe.  We will need to follow this closely as this could become a limb threatening problem.  She has an extensive surgical history as described below.  Her options for revascularization on the right may be limited.  We will have to consider arteriography once the carotid issue is resolved.  She will also need vein mapping of the right great saphenous vein.   HPI:   Sherri Brooks is a pleasant 74 y.o. female she has a complicated vascular history.  She has had 2 failed femoropopliteal bypass graft on the left.  One was with vein and one was with the prosthetic graft I believe.  In August 2018 I performed angioplasty of the left external iliac artery stenosis.  At the time of that procedure, on the right side the superficial  femoral artery had moderate to severe diffuse disease throughout.  The popliteal artery was patent with two-vessel runoff via the peroneal and posterior tibial arteries.  The anterior tibial artery was occluded.  Of note the sheath was occlusive in the common femoral artery.  She underwent angioplasty and stenting from the left common femoral artery to the tibial peroneal trunk by Dr. Donzetta Matters in Sept of 2018.  This was for rest pain of the left foot.  She tells me that she was hospitalized in [place] with seizure activity.  I believe this was in June.  A carotid duplex scan at that time showed a greater than 70% right carotid stenosis with 50 to 69% left carotid stenosis.  She denies any history of stroke, TIAs, expressive or receptive aphasia, or amaurosis fugax.  She is on aspirin, Plavix, and a statin.  She also notes that she developed a small blackened area on the tip of her right great toe.  I do not get any history of claudication although I think her activity is limited as she is quite debilitated.  She denies any history of rest pain.   Past Medical History:  Diagnosis Date  . Arthritis   . Asthma   . CAD (coronary artery disease)   . Cellulitis of buttock, left 2010  . Diabetes mellitus   . GERD (gastroesophageal reflux disease)   . Hyperlipidemia   . Hypertension   . MI (myocardial infarction) ([place])   . PVD (peripheral vascular disease) ([place])   .  Wears glasses     Family History  Problem Relation Age of Onset  . Brain cancer Father   . Cancer Father   . Diabetes Mother        many family members  . Alzheimer's disease Mother   . Heart disease Other     SOCIAL HISTORY: Social History   Tobacco Use  . Smoking status: Current Every Day Smoker    Packs/day: 0.50    Years: 30.00    Pack years: 15.00    Types: Cigarettes    Start date: 11/04/2012  . Smokeless tobacco: Never Used  . Tobacco comment: trying to cut back, 1/2 a pack 02/25/20  Substance Use Topics  .  Alcohol use: No    Alcohol/week: 0.0 standard drinks    Allergies  Allergen Reactions  . Hydrocodone Hives and Itching  . Iohexol Hives         Current Outpatient Medications  Medication Sig Dispense Refill  . albuterol (PROVENTIL HFA;VENTOLIN HFA) 108 (90 BASE) MCG/ACT inhaler Inhale 2 puffs into the lungs every 6 (six) hours as needed. For bronchitis and coughing    . albuterol (PROVENTIL) (2.5 MG/3ML) 0.083% nebulizer solution Take 3 mLs (2.5 mg total) by nebulization every 6 (six) hours as needed for wheezing or shortness of breath. 75 mL 12  . alendronate (FOSAMAX) 70 MG tablet Take 70 mg by mouth every 7 (seven) days. On Mondays    . ALPRAZolam (XANAX) 0.5 MG tablet Take 0.5 mg by mouth 3 (three) times daily.    Marland Kitchen amLODipine (NORVASC) 5 MG tablet Take 1 tablet (5 mg total) by mouth daily. 30 tablet 0  . aspirin 81 MG chewable tablet Chew 1 tablet (81 mg total) by mouth daily. 30 tablet 0  . atorvastatin (LIPITOR) 20 MG tablet Take 1 tablet (20 mg total) by mouth at bedtime. (Patient taking differently: Take 20 mg by mouth daily. ) 30 tablet 0  . clopidogrel (PLAVIX) 75 MG tablet Take 75 mg by mouth daily.      Marland Kitchen donepezil (ARICEPT) 5 MG tablet     . dronabinol (MARINOL) 5 MG capsule     . ferrous sulfate 325 (65 FE) MG tablet Take 1 tablet (325 mg total) by mouth daily with breakfast.  3  . furosemide (LASIX) 20 MG tablet Take 20 mg by mouth every morning.    . insulin glargine (LANTUS) 100 UNIT/ML injection Inject 0.2 mLs (20 Units total) into the skin at bedtime. (Patient taking differently: Inject 30 Units into the skin at bedtime. ) 10 mL 11  . Iron-Vitamins (GERITOL COMPLETE PO) Take 1 tablet by mouth daily.     Marland Kitchen labetalol (NORMODYNE) 300 MG tablet Take 1 tablet (300 mg total) by mouth 2 (two) times daily. 60 tablet 0  . latanoprost (XALATAN) 0.005 % ophthalmic solution Place 1 drop into both eyes at bedtime.     . Omega-3 Fatty Acids (FISH OIL) 1000 MG CPDR Take 1,000 mg by  mouth daily.     . varenicline (CHANTIX CONTINUING MONTH PAK) 1 MG tablet Take 1 tablet (1 mg total) by mouth 2 (two) times daily. 60 tablet 5   No current facility-administered medications for this visit.    REVIEW OF SYSTEMS:  [X]  denotes positive finding, [ ]  denotes negative finding Cardiac  Comments:  Chest pain or chest pressure:    Shortness of breath upon exertion:    Short of breath when lying flat:    Irregular heart rhythm:  Vascular    Pain in calf, thigh, or hip brought on by ambulation:    Pain in feet at night that wakes you up from your sleep:     Blood clot in your veins:    Leg swelling:         Pulmonary    Oxygen at home:    Productive cough:     Wheezing:         Neurologic    Sudden weakness in arms or legs:     Sudden numbness in arms or legs:     Sudden onset of difficulty speaking or slurred speech:    Temporary loss of vision in one eye:     Problems with dizziness:         Gastrointestinal    Blood in stool:     Vomited blood:         Genitourinary    Burning when urinating:     Blood in urine:        Psychiatric    Major depression:         Hematologic    Bleeding problems:    Problems with blood clotting too easily:        Skin    Rashes or ulcers: x       Constitutional    Fever or chills:     PHYSICAL EXAM:   Vitals:   06/15/20 1312  BP: (!) 98/58  Pulse: 66  Resp: 20  Temp: (!) 97.3 F (36.3 C)  SpO2: 92%  Weight: 120 lb (54.4 kg)  Height: 5\' 3"  (1.6 m)    GENERAL: The patient is a well-nourished female, in no acute distress. The vital signs are documented above. CARDIAC: There is a regular rate and rhythm.  VASCULAR: I do not detect carotid bruits. She has palpable femoral pulses.  I cannot palpate pedal pulses. PULMONARY: There is good air exchange bilaterally without wheezing or rales. ABDOMEN: Soft and non-tender with normal pitched bowel sounds.  MUSCULOSKELETAL: There are no major deformities or  cyanosis. NEUROLOGIC: No focal weakness or paresthesias are detected. SKIN: She has a small eschar on the tip of her right great toe.    PSYCHIATRIC: The patient has a normal affect.  DATA:    LIMITED CAROTID DUPLEX: I have independently interpreted her limited carotid duplex scan today of the right side only. This shows a greater than 80% proximal internal carotid artery stenosis. The right vertebral artery is patent with antegrade flow.  ARTERIAL DOPPLER: I have independently interpreted her arterial duplex scan today.   On the right side there is monophasic flow noted in the posterior tibial artery with no flow noted in the dorsalis pedis artery. The ABI on the right is 35%. Toe pressures 28 mmHg.  On the left side there is a biphasic posterior tibial signal with an ABI of 94%. The patient has had a previous toe amputation and toe pressure could not be obtained.   ARTERIAL DUPLEX: This patient had a stent of the left femoropopliteal bypass. The stent is widely patent with no evidence of stenosis within the stent.    Deitra Mayo [facility] [phone]

## 2020-06-16 ENCOUNTER — Other Ambulatory Visit: Payer: Self-pay

## 2020-06-22 ENCOUNTER — Telehealth: Payer: Self-pay

## 2020-06-22 NOTE — Telephone Encounter (Signed)
Patient's daughter verbalized understanding to arrive at 0700 AM for procedure at [facility] on 07/12/20.

## 2020-06-22 NOTE — Telephone Encounter (Signed)
Attempted to reach pt to inform of arrival time change to 0700 AM for procedure at [facility] on 07/12/20. UTR pt. Left message for pt to return call.

## 2020-06-24 ENCOUNTER — Ambulatory Visit: Payer: Medicare HMO | Admitting: General Practice

## 2020-06-27 ENCOUNTER — Telehealth (INDEPENDENT_AMBULATORY_CARE_PROVIDER_SITE_OTHER): Payer: Medicare HMO | Admitting: Internal Medicine

## 2020-06-27 DIAGNOSIS — R911 Solitary pulmonary nodule: Secondary | ICD-10-CM

## 2020-06-27 NOTE — Telephone Encounter (Signed)
Unable to reach patient. Patient has dementia and doesn't always answer her phone.  Called patient's daughter to discuss results of enlarging spiculated lung lesion.  She did not undergo biopsy due to concern for "reaction to dye." I've asked them to come into office so we can discuss repeat biopsy as my concern is high for primary lung malignancy.

## 2020-06-27 NOTE — Telephone Encounter (Signed)
Spoke with Sherri Brooks patient's daughter regarding prior message from [place]. Made a f/u appt for patient and daughter to come into the office to discuss biopsy. Patient has a appt on 07/07/20 at 2:15pm. Angela's voice was understanding. Nothing else further needed.

## 2020-07-04 NOTE — Progress Notes (Signed)
[id number] - , [place] [address] Phone: [phone] Fax: [phone]  [facility] - [place], [place] - [address] [place] [address] Phone: [phone] Fax: [phone]      Your procedure is scheduled on September 28  Report to [facility] Main Entrance "A" at 0700 A.M., and check in at the Admitting office.  Call this number if you have problems the morning of surgery:  [phone]  Call [phone] if you have any questions prior to your surgery date Monday-Friday 8am-4pm    Remember:  Do not eat or drink after midnight the night before your surgery    Take these medicines the morning of surgery with A SIP OF WATER  albuterol (PROVENTIL HFA;VENTOLIN HFA)  If needed, Please bring all inhalers with you the day of surgery.  albuterol (PROVENTIL) (2.5 MG/3ML) NEB if needed ALPRAZolam (XANAX)  escitalopram (LEXAPRO) labetalol (NORMODYNE) Eye drop if needed  Follow your surgeon's instructions on when to stop Aspirin and clopidogrel (PLAVIX) .  If no instructions were given by your surgeon then you will need to call the office to get those instructions.     As of today, STOP taking any Aspirin (unless otherwise instructed by your surgeon) Aleve, Naproxen, Ibuprofen, Motrin, Advil, Goody's, BC's, all herbal medications, fish oil, and all vitamins.           WHAT DO I DO ABOUT MY DIABETES MEDICATION?   Marland Kitchen Do not take oral diabetes medicines (pills) the morning of surgery.  . THE NIGHT BEFORE SURGERY, take ______15_____ units of ___insulin glargine (LANTUS)________insulin.      HOW TO MANAGE YOUR DIABETES BEFORE AND AFTER SURGERY  Why is it important to control my blood sugar before and after surgery? . Improving blood sugar levels before and after surgery helps healing and can limit problems. . A  way of improving blood sugar control is eating a healthy diet by: o  Eating less sugar and carbohydrates o  Increasing activity/exercise o  Talking with your doctor about reaching your blood sugar goals . High blood sugars (greater than 180 mg/dL) can raise your risk of infections and slow your recovery, so you will need to focus on controlling your diabetes during the weeks before surgery. . Make sure that the doctor who takes care of your diabetes knows about your planned surgery including the date and location.  How do I manage my blood sugar before surgery? . Check your blood sugar at least 4 times a day, starting 2 days before surgery, to make sure that the level is not too high or low. . Check your blood sugar the morning of your surgery when you wake up and every 2 hours until you get to the Short Stay unit. o If your blood sugar is less than 70 mg/dL, you will need to treat for low blood sugar: - Do not take insulin. - Treat a low blood sugar (less than 70 mg/dL) with  cup of clear juice (cranberry or apple), 4 glucose tablets, OR glucose gel. - Recheck blood sugar in 15 minutes after treatment (to make sure it is greater than 70 mg/dL). If your blood sugar is not greater than 70 mg/dL on recheck, call [phone] for further instructions. . Report your blood sugar to the short stay nurse when you get to Short Stay.  Marland Kitchen  If you are admitted to the hospital after surgery: o Your blood sugar will be checked by the staff and you will probably be given insulin after surgery (instead of oral diabetes medicines) to make sure you have good blood sugar levels. o The goal for blood sugar control after surgery is 80-180 mg/dL.              Do not wear jewelry, make up, or nail polish            Do not wear lotions, powders, perfumes/colognes, or deodorant.            Do not shave 48 hours prior to surgery.  Men may shave face and neck.            Do not bring valuables to the hospital.             [facility] is not responsible for any belongings or valuables.  Do NOT Smoke (Tobacco/Vaping) or drink Alcohol 24 hours prior to your procedure If you use a CPAP at night, you may bring all equipment for your overnight stay.   Contacts, glasses, dentures or bridgework may not be worn into surgery.      For patients admitted to the hospital, discharge time will be determined by your treatment team.   Patients discharged the day of surgery will not be allowed to drive home, and someone needs to stay with them for 24 hours.    Special instructions:   Sherri Brooks- Preparing For Surgery  Before surgery, you can play an important role. Because skin is not sterile, your skin needs to be as free of germs as possible. You can reduce the number of germs on your skin by washing with CHG (chlorahexidine gluconate) Soap before surgery.  CHG is an antiseptic cleaner which kills germs and bonds with the skin to continue killing germs even after washing.    Oral Hygiene is also important to reduce your risk of infection.  Remember - BRUSH YOUR TEETH THE MORNING OF SURGERY WITH YOUR REGULAR TOOTHPASTE  Please do not use if you have an allergy to CHG or antibacterial soaps. If your skin becomes reddened/irritated stop using the CHG.  Do not shave (including legs and underarms) for at least 48 hours prior to first CHG shower. It is OK to shave your face.  Please follow these instructions carefully.   1. Shower the NIGHT BEFORE SURGERY and the MORNING OF SURGERY with CHG Soap.   2. If you chose to wash your hair, wash your hair first as usual with your normal shampoo.  3. After you shampoo, rinse your hair and body thoroughly to remove the shampoo.  4. Use CHG as you would any other liquid soap. You can apply CHG directly to the skin and wash gently with a scrungie or a clean washcloth.   5. Apply the CHG Soap to your body ONLY FROM THE NECK DOWN.  Do not use on open wounds or open sores. Avoid  contact with your eyes, ears, mouth and genitals (private parts). Wash Face and genitals (private parts)  with your normal soap.   6. Wash thoroughly, paying special attention to the area where your surgery will be performed.  7. Thoroughly rinse your body with warm water from the neck down.  8. DO NOT shower/wash with your normal soap after using and rinsing off the CHG Soap.  9. Pat yourself dry with a CLEAN TOWEL.  10. Wear CLEAN PAJAMAS to bed  the night before surgery  11. Place CLEAN SHEETS on your bed the night of your first shower and DO NOT SLEEP WITH PETS.   Day of Surgery: Wear Clean/Comfortable clothing the morning of surgery Do not apply any deodorants/lotions.   Remember to brush your teeth WITH YOUR REGULAR TOOTHPASTE.   Please read over the following fact sheets that you were given.

## 2020-07-05 ENCOUNTER — Other Ambulatory Visit: Payer: Self-pay

## 2020-07-05 ENCOUNTER — Encounter (HOSPITAL_COMMUNITY): Payer: Self-pay

## 2020-07-05 ENCOUNTER — Encounter (HOSPITAL_COMMUNITY)
Admission: RE | Admit: 2020-07-05 | Discharge: 2020-07-05 | Disposition: A | Discharge status: Home / Self Care | Payer: Medicare HMO | Source: Ambulatory Visit | Attending: Vascular Surgery | Admitting: Vascular Surgery

## 2020-07-05 DIAGNOSIS — Z01812 Encounter for preprocedural laboratory examination: Secondary | ICD-10-CM | POA: Diagnosis not present

## 2020-07-05 HISTORY — DX: Anemia, unspecified: D64.9

## 2020-07-05 HISTORY — DX: Anxiety disorder, unspecified: F41.9

## 2020-07-05 HISTORY — DX: Cardiac murmur, unspecified: R01.1

## 2020-07-05 HISTORY — DX: Dementia in other diseases classified elsewhere, unspecified severity, without behavioral disturbance, psychotic disturbance, mood disturbance, and anxiety: F02.80

## 2020-07-05 LAB — SURGICAL PCR SCREEN
MRSA, PCR: POSITIVE — AB
Staphylococcus aureus: POSITIVE — AB

## 2020-07-05 LAB — URINALYSIS, ROUTINE W REFLEX MICROSCOPIC
Bilirubin Urine: NEGATIVE
Glucose, UA: NEGATIVE mg/dL
Hgb urine dipstick: NEGATIVE
Ketones, ur: NEGATIVE mg/dL
Nitrite: NEGATIVE
Protein, ur: NEGATIVE mg/dL
Specific Gravity, Urine: 1.008 (ref 1.005–1.030)
pH: 7 (ref 5.0–8.0)

## 2020-07-05 LAB — PROTIME-INR
INR: 1.1 (ref 0.8–1.2)
Prothrombin Time: 13.7 seconds (ref 11.4–15.2)

## 2020-07-05 LAB — APTT: aPTT: 32 seconds (ref 24–36)

## 2020-07-05 LAB — GLUCOSE, CAPILLARY
Glucose-Capillary: 62 mg/dL — ABNORMAL LOW (ref 70–99)
Glucose-Capillary: 98 mg/dL (ref 70–99)

## 2020-07-05 NOTE — Progress Notes (Addendum)
PCP - Dr. Sherrian Divers in [place] in [place] unsure of name, new for her they think it is Dr. Stanford Breed  Chest x-ray - Not indicated EKG - 01/21/20 Stress Test - Years ago ECHO - 06/11/17 Cardiac Cath - 06/14/17  Sleep Study - No OSA   DM - Type II  Fasting Blood Sugar - 91 this am before arrival CBG 62 on admission to PAT appt - follow up CBG 98 Checks Blood Sugar __2___ times a day  Blood Thinner Instructions: Plavix daughter to call Dr. Scot Dock office for instructions on when to stop Aspirin Instructions: Aspirin daughter will call Dr. Scot Dock for instruction on when to stop  COVID TEST- 07/08/20  Anesthesia review: Yes cardiac history.  Patient extremely difficult stick. Not eating and drinking adequately at home. Did not eat breakfast or lunch today.  Daughter Levada Dy trying to encourage and help her.  Patient denies shortness of breath, fever, cough and chest pain at PAT appointment   All instructions explained to the patient, with a verbal understanding of the material. Patient agrees to go over the instructions while at home for a better understanding. Patient also instructed to self quarantine after being tested for COVID-19. The opportunity to ask questions was provided.   Brought patient's daughter into appt. Patient was not clear.   Unable to collect all labs.  Three RN's attempted to get the blood sample. Collected PT/INR.  Still need T & S, CMP, and CBC. Called Dr. Nicole Cella office and spoke to [facility] surgical scheduler to make aware.   07/05/20 1620 Staff message sent to Dr. Scot Dock regarding urine collection showing leukocytes and bacteria.

## 2020-07-06 ENCOUNTER — Ambulatory Visit: Payer: Medicare HMO | Admitting: Neurology

## 2020-07-06 ENCOUNTER — Encounter (HOSPITAL_COMMUNITY): Payer: Self-pay | Admitting: Physician Assistant

## 2020-07-07 ENCOUNTER — Ambulatory Visit: Payer: Medicare HMO | Admitting: Physician Assistant

## 2020-07-07 ENCOUNTER — Encounter: Payer: Self-pay | Admitting: Physician Assistant

## 2020-07-07 ENCOUNTER — Ambulatory Visit: Payer: Medicare HMO | Admitting: Internal Medicine

## 2020-07-07 ENCOUNTER — Other Ambulatory Visit: Payer: Self-pay

## 2020-07-07 VITALS — BP 130/61 | HR 74 | Ht 63.0 in | Wt 106.0 lb

## 2020-07-07 DIAGNOSIS — E119 Type 2 diabetes mellitus without complications: Secondary | ICD-10-CM | POA: Diagnosis not present

## 2020-07-07 DIAGNOSIS — I10 Essential (primary) hypertension: Secondary | ICD-10-CM

## 2020-07-07 DIAGNOSIS — I6523 Occlusion and stenosis of bilateral carotid arteries: Secondary | ICD-10-CM

## 2020-07-07 DIAGNOSIS — J984 Other disorders of lung: Secondary | ICD-10-CM

## 2020-07-07 DIAGNOSIS — I739 Peripheral vascular disease, unspecified: Secondary | ICD-10-CM

## 2020-07-07 DIAGNOSIS — Z01818 Encounter for other preprocedural examination: Secondary | ICD-10-CM | POA: Diagnosis not present

## 2020-07-07 DIAGNOSIS — E785 Hyperlipidemia, unspecified: Secondary | ICD-10-CM

## 2020-07-07 DIAGNOSIS — I2510 Atherosclerotic heart disease of native coronary artery without angina pectoris: Secondary | ICD-10-CM | POA: Diagnosis not present

## 2020-07-07 NOTE — Progress Notes (Signed)
Cardiology Office Note:    Date:  07/08/2020   ID:  Sherri Brooks, DOB 1946-05-09, MRN [id number]  PCP:  Redmond School, MD  [facility] Cardiologist:  Kirk Ruths, MD  [place] Electrophysiologist:  None   Referring MD: Redmond School, MD   Chief Complaint  Patient presents with  . Pre-op Exam    upcoming carotid artery endarterectomy by Dr. Scot Dock    History of Present Illness:    Sherri Brooks is a 74 y.o. female with a hx of HTN, PVD, DM II, GERD, and CAD. Patient has been evaluated by Dr. Scot Dock in vascular surgery for nonhealing wound on her left transmetatarsal. She underwent lower extremity arteriogram on 06/10/2017, near the end of the procedure, she developed respiratory distress. Cardiology service was consulted. She was given IV Lasix. Her heart rate was elevated, hemoglobin prior to the procedure was down to 7.5. Echocardiogram obtained on 06/03/2017 showed EF 65-70%, grade 2 DD, moderately calcified mitral valve with moderate regurgitation. Given minimally elevated troponin and her pulmonary edema, definitive evaluation was recommended. She underwent cardiac catheterization on 06/14/2017 which showed 80% ostial RCA lesion treated with Caryl Comes 3.0 x 12 mm DES, 70% ostial D1 lesion followed by an 80% lesion, 55% distal LAD lesion, 50% OM1 lesion, 55% ostial OM 2 lesion, mild 2+ MR, EF greater than 65%.  I last saw the patient for follow-up in September 2018 at which time she was doing well.  We emphasized the need to be compliant with aspirin and Plavix for a minimum of 1 year.  Unfortunately she was lost to follow-up since then.  In September 2018, she underwent stenting of left common femoral to tibial peroneal trunk due to critical left lower extremity ischemia.  Patient was admitted in April 2021 due to increased confusion.  CT showed a spiculated left upper lobe nodule measuring at the 2.3 x 1.7 cm.  MRI of the brain was negative for acute finding.  Her altered mental  status was felt to be secondary to UTI and dehydration.  She was treated with IV fluid and antibiotic with improvement.  She was referred to outpatient neuropsychology due to underlying cognitive impairment.  After discharge, it was recommended for the patient to undergo outpatient PET scan.  PET scan showed hypermetabolic thick-walled cavitary lesion in the left upper lobe consistent with bronchogenic carcinoma, no evidence of distant disease.  She also had bilateral hypermetabolic lesion in the parotid gland favoring primary parotid neoplasm.  She was referred to ENT service for parotid mass.  Recent carotid Doppler revealed a greater than 80% right carotid artery stenosis.  She was seen by Dr. Lou Cal who recommended a right carotid enterectomy.  Patient presents today for preoperative clearance prior to endarterectomy surgery by Dr. Scot Dock.  She denies any recent chest pain or shortness of breath. She was instructed to hold Plavix for 5 days prior to the surgery, her daughter has held her Plavix starting this morning.  Talking with the patient, prior to April, she was able to ambulate with a walker.  However since April, she spent majority of the day in wheelchair due to spontaneous occurrence of leg spasm and jerking movement which may cause falling episode if she is not sitting down.  EKG is unchanged when compared to 2018.  Her functional ability is quite limited as she spends majority of the day in the wheelchair.  On physical exam, she is euvolemic and has no lower extremity edema.  She also denies any orthopnea or  PND.  She is still smoking however trying to quit and is just started on the Chantix therapy.  I discussed her case with Dr. Peter Martinique DOD today, doing a Myoview at this point is unlikely to lower her overall risk.  Given her comorbidities, patient is likely a high risk candidate for any procedure however per Dr. Martinique, this will not completely prohibit her from having the carotid  enterectomy.  Therefore, she is cleared from cardiac perspective as a high risk patient.  Cardiology service will be available as needed during perioperative period.    Past Medical History:  Diagnosis Date  . Alzheimer disease ([place])   . Anemia   . Anxiety   . Arthritis   . Asthma   . CAD (coronary artery disease)   . Cellulitis of buttock, left 2010  . Diabetes mellitus   . GERD (gastroesophageal reflux disease)   . Heart murmur   . Hyperlipidemia   . Hypertension   . MI (myocardial infarction) ([place])   . PVD (peripheral vascular disease) ([place])   . Wears glasses     Past Surgical History:  Procedure Laterality Date  . ABDOMINAL AORTAGRAM N/A 07/28/2012   Procedure: ABDOMINAL Maxcine Ham;  Surgeon: Angelia Mould, MD;  Location: [facility] CATH LAB;  Service: Cardiovascular;  Laterality: N/A;  . ABDOMINAL AORTOGRAM W/LOWER EXTREMITY N/A 06/10/2017   Procedure: ABDOMINAL AORTOGRAM W/LOWER EXTREMITY;  Surgeon: Angelia Mould, MD;  Location: [place] CV LAB;  Service: Cardiovascular;  Laterality: N/A;  . ABDOMINAL HYSTERECTOMY    . COLONOSCOPY  10/26/2011   Procedure: COLONOSCOPY;  Surgeon: Dorothyann Peng, MD;  [facility] ENDO SU[facility];  Service: Endoscopy;  Laterality: N/A;  1:30  . CORONARY STENT INTERVENTION N/A 06/14/2017   Procedure: CORONARY STENT INTERVENTION;  Surgeon: Leonie Man, MD;  Location: [place] CV LAB;  Service: Cardiovascular;  Laterality: N/A;  . FEMORAL-POPLITEAL BYPASS GRAFT  07/2010   pt has had 3 fem-pop bpg  . FEMORAL-POPLITEAL BYPASS GRAFT  11/04/2012   Procedure: BYPASS GRAFT FEMORAL-POPLITEAL ARTERY;  Surgeon: Angelia Mould, MD;  Location: [facility] OR;  Service: Vascular;  Laterality: Left;  Redo Left Femoral Popliteal Bypass with PTFE  . FOOT AMPUTATION  2010   left  . LEFT HEART CATH AND CORONARY ANGIOGRAPHY N/A 06/14/2017   Procedure: LEFT HEART CATH AND CORONARY ANGIOGRAPHY;  Surgeon: Leonie Man, MD;  Location: [place] CV  LAB;  Service: Cardiovascular;  Laterality: N/A;  . LOWER EXTREMITY ANGIOGRAM Bilateral 07/28/2012   Procedure: LOWER EXTREMITY ANGIOGRAM;  Surgeon: Angelia Mould, MD;  Location: [facility] CATH LAB;  Service: Cardiovascular;  Laterality: Bilateral;  . LOWER EXTREMITY ANGIOGRAM Left 01/02/2016   Procedure: Lower Extremity Angiogram;  Surgeon: Angelia Mould, MD;  Location: [place] CV LAB;  Service: Cardiovascular;  Laterality: Left;  . LOWER EXTREMITY ANGIOGRAM Left 07/03/2017   Procedure: Lower Extremity Angiogram;  Surgeon: Waynetta Sandy, MD;  Location: [place] CV LAB;  Service: Cardiovascular;  Laterality: Left;  . LOWER EXTREMITY INTERVENTION Left 07/03/2017   Procedure: Lower Extremity Intervention;  Surgeon: Waynetta Sandy, MD;  Location: [place] CV LAB;  Service: Cardiovascular;  Laterality: Left;  . ORIF ANKLE FRACTURE Left 01/27/2013   Procedure: OPEN REDUCTION INTERNAL FIXATION (ORIF) ANKLE FRACTURE;  Surgeon: Wylene Simmer, MD;  Location: [place];  Service: Orthopedics;  Laterality: Left;  . PERCUTANEOUS STENT INTERVENTION Left 07/28/2012   Procedure: PERCUTANEOUS STENT INTERVENTION;  Surgeon: Angelia Mould, MD;  Location:  [place] CATH LAB;  Service: Cardiovascular;  Laterality: Left;  lt ext tiliac stentx1  . PERIPHERAL VASCULAR BALLOON ANGIOPLASTY Left 06/10/2017   Procedure: PERIPHERAL VASCULAR BALLOON ANGIOPLASTY;  Surgeon: Angelia Mould, MD;  Location: [place] CV LAB;  Service: Cardiovascular;  Laterality: Left;  external iliac  . PERIPHERAL VASCULAR CATHETERIZATION N/A 01/02/2016   Procedure: Abdominal Aortogram;  Surgeon: Angelia Mould, MD;  Location: [place] CV LAB;  Service: Cardiovascular;  Laterality: N/A;  . PERIPHERAL VASCULAR CATHETERIZATION Left 01/02/2016   Procedure: Peripheral Vascular Intervention;  Surgeon: Angelia Mould, MD;  Location: [place] CV LAB;  Service: Cardiovascular;   Laterality: Left;  lt ext iliac  . right common iliac stent  10/2009   Dr Doren Custard ([place])  . S/P Hysterecotmy     partial  . TONSILLECTOMY    . VASCULAR SURGERY      Current Medications: Current Meds  Medication Sig  . albuterol (PROVENTIL HFA;VENTOLIN HFA) 108 (90 BASE) MCG/ACT inhaler Inhale 2 puffs into the lungs every 6 (six) hours as needed. For bronchitis and coughing  . albuterol (PROVENTIL) (2.5 MG/3ML) 0.083% nebulizer solution Take 3 mLs (2.5 mg total) by nebulization every 6 (six) hours as needed for wheezing or shortness of breath.  Marland Kitchen alendronate (FOSAMAX) 70 MG tablet Take 70 mg by mouth every 7 (seven) days.   . ALPRAZolam (XANAX) 0.5 MG tablet Take 0.5 mg by mouth 3 (three) times daily.  Marland Kitchen amLODipine (NORVASC) 5 MG tablet Take 1 tablet (5 mg total) by mouth daily.  Marland Kitchen aspirin 81 MG chewable tablet Chew 1 tablet (81 mg total) by mouth daily.  Marland Kitchen atorvastatin (LIPITOR) 20 MG tablet Take 1 tablet (20 mg total) by mouth at bedtime. (Patient taking differently: Take 20 mg by mouth daily. )  . Cholecalciferol (VITAMIN D) 50 MCG (2000 UT) CAPS Take 2,000 Units by mouth daily.  . clopidogrel (PLAVIX) 75 MG tablet Take 75 mg by mouth daily.    Marland Kitchen donepezil (ARICEPT) 5 MG tablet Take 5 mg by mouth at bedtime.   . dronabinol (MARINOL) 5 MG capsule Take 5 mg by mouth 2 (two) times daily before lunch and supper.   . escitalopram (LEXAPRO) 10 MG tablet Take 10 mg by mouth daily.  . ferrous sulfate 325 (65 FE) MG tablet Take 1 tablet (325 mg total) by mouth daily with breakfast.  . furosemide (LASIX) 20 MG tablet Take 20 mg by mouth every morning.  . insulin glargine (LANTUS) 100 UNIT/ML injection Inject 0.2 mLs (20 Units total) into the skin at bedtime. (Patient taking differently: Inject 30 Units into the skin at bedtime. )  . Iron-Vitamins (GERITOL COMPLETE PO) Take 1 tablet by mouth daily.   Marland Kitchen labetalol (NORMODYNE) 300 MG tablet Take 1 tablet (300 mg total) by mouth 2 (two) times daily.  Marland Kitchen  latanoprost (XALATAN) 0.005 % ophthalmic solution Place 1 drop into both eyes at bedtime.   . Omega-3 Fatty Acids (FISH OIL) 1000 MG CPDR Take 1,000 mg by mouth every other day.   . varenicline (CHANTIX CONTINUING MONTH PAK) 1 MG tablet Take 1 tablet (1 mg total) by mouth 2 (two) times daily.     Allergies:   Hydrocodone and Iohexol   Social History   Socioeconomic History  . Marital status: Widowed    Spouse name: Not on file  . Number of children: 3  . Years of education: Not on file  . Highest education level: Not on file  Occupational History  .  Occupation: retired; Biochemist, clinical: RETIRED  Tobacco Use  . Smoking status: Current Every Day Smoker    Packs/day: 0.50    Years: 30.00    Pack years: 15.00    Types: Cigarettes    Start date: 11/04/2012  . Smokeless tobacco: Never Used  . Tobacco comment: trying to cut back, 1/2 a pack 02/25/20  Vaping Use  . Vaping Use: Never assessed  Substance and Sexual Activity  . Alcohol use: No    Alcohol/week: 0.0 standard drinks  . Drug use: No  . Sexual activity: Not Currently  Other Topics Concern  . Not on file  Social History Narrative  . Not on file   Social Determinants of Health   Financial Resource Strain:   . Difficulty of Paying Living Expenses: Not on file  Food Insecurity:   . Worried About Charity fundraiser in the Last Year: Not on file  . Ran Out of Food in the Last Year: Not on file  Transportation Needs:   . Lack of Transportation (Medical): Not on file  . Lack of Transportation (Non-Medical): Not on file  Physical Activity:   . Days of Exercise per Week: Not on file  . Minutes of Exercise per Session: Not on file  Stress:   . Feeling of Stress : Not on file  Social Connections:   . Frequency of Communication with Friends and Family: Not on file  . Frequency of Social Gatherings with Friends and Family: Not on file  . Attends Religious Services: Not on file  . Active Member of Clubs or  Organizations: Not on file  . Attends Archivist Meetings: Not on file  . Marital Status: Not on file     Family History: The patient's family history includes Alzheimer's disease in her mother; Brain cancer in her father; Cancer in her father; Diabetes in her mother; Heart disease in an other family member.  ROS:   Please see the history of present illness.     All other systems reviewed and are negative.  EKGs/Labs/Other Studies Reviewed:    The following studies were reviewed today:  Echo 06/11/2017 LV EF: 65% -  70%   -------------------------------------------------------------------  Indications:   Respiratory Failure acute 518.81.   -------------------------------------------------------------------  History:  PMH:  Dyspnea. Risk factors: Current tobacco use.  Diabetes mellitus.   -------------------------------------------------------------------  Study Conclusions   - Left ventricle: The cavity size was normal. Wall thickness was  increased in a pattern of moderate LVH. Systolic function was  vigorous. The estimated ejection fraction was in the range of 65%  to 70%. Wall motion was normal; there were no regional wall  motion abnormalities. Features are consistent with a pseudonormal  left ventricular filling pattern, with concomitant abnormal  relaxation and increased filling pressure (grade 2 diastolic  dysfunction).  - Mitral valve: Moderately calcified annulus. There was systolic  anterior motion. There was moderate regurgitation directed  posteriorly.  - Pulmonary arteries: Systolic pressure was mildly increased.   Impressions:   - Compared to the prior study, there has been no significant  interval change.    Cath 06/14/2017  Ost RCA lesion, 80 %stenosed.  A STENT (XIENCE DED) SIERRA 3.00 X 12 MM drug eluting stent was successfully placed. Post-dilated to 3.6 mm.  Post intervention, there is a 0% residual  stenosis.  ______________________  Colon Flattery 1st Diag lesion, 70 %stenosed. 1st Diag lesion, 80 %stenosed.  Dist LAD lesion, 55 %stenosed - beyond  both Diag branches.  Ost 1st Mrg to 1st Mrg lesion, 55 %stenosed. Ost 2nd Mrg to 2nd Mrg lesion, 55 %stenosed.  There is hyperdynamic left ventricular systolic function. The left ventricular ejection fraction is greater than 65% by visual estimate.  There is mild (2+) mitral regurgitation.   Severe ostial RCA disease - PCI with DES Severe ostial & proximal D1 (large vessel) - not favorable for PCI, but could consider PTCA. Moderate ostial OM2 & Om2. Normal EF Systemic HTN with mild-moderately elevated LVEDP.   Plan: Transfer to Post-procedure unit for post PCI care & TR Band Removal  Continue ASA/Plavix for minimum of 6 months - could temporarily hold Plavix at that time for procedure.  Continue aggressive RF modifications - BP control, lipids, glycemic control.  Monitor for contrast hypersensitivity.    EKG:  EKG is ordered today.  The ekg ordered today demonstrates normal sinus rhythm, no significant ST-T wave changes.  Recent Labs: 01/21/2020: ALT 13; B Natriuretic Peptide 57.0 01/22/2020: TSH 2.074 01/23/2020: BUN 14; Creatinine, Ser 1.22; Magnesium 2.3; Potassium 3.6; Sodium 140 03/21/2020: Hemoglobin 9.5; Platelets 278  Recent Lipid Panel    Component Value Date/Time   CHOL  07/08/2010 0605    140        ATP III CLASSIFICATION:  <200     mg/dL   Desirable  200-239  mg/dL   Borderline High  >=240    mg/dL   High          TRIG 88 07/08/2010 0605   HDL 32 (L) 07/08/2010 0605   CHOLHDL 4.4 07/08/2010 0605   VLDL 18 07/08/2010 0605   LDLCALC  07/08/2010 0605    90        Total Cholesterol/HDL:CHD Risk Coronary Heart Disease Risk Table                     Men   Women  1/2 Average Risk   3.4   3.3  Average Risk       5.0   4.4  2 X Average Risk   9.6   7.1  3 X Average Risk  23.4   11.0        Use the calculated Patient  Ratio above and the CHD Risk Table to determine the patient's CHD Risk.        ATP III CLASSIFICATION (LDL):  <100     mg/dL   Optimal  100-129  mg/dL   Near or Above                    Optimal  130-159  mg/dL   Borderline  160-189  mg/dL   High  >190     mg/dL   Very High    Physical Exam:    VS:  BP 130/61   Pulse 74   Ht 5\' 3"  (1.6 m)   Wt 106 lb (48.1 kg)   SpO2 99%   BMI 18.78 kg/m     Wt Readings from Last 3 Encounters:  07/07/20 106 lb (48.1 kg)  07/05/20 113 lb 8 oz (51.5 kg)  06/15/20 120 lb (54.4 kg)     GEN:  Well nourished, well developed in no acute distress HEENT: Normal NECK: No JVD; No carotid bruits LYMPHATICS: No lymphadenopathy CARDIAC: RRR, no murmurs, rubs, gallops RESPIRATORY:  Clear to auscultation without rales, wheezing or rhonchi  ABDOMEN: Soft, non-tender, non-distended MUSCULOSKELETAL:  No edema; No deformity  SKIN: Warm and dry NEUROLOGIC:  Alert and oriented x 3 PSYCHIATRIC:  Normal affect   ASSESSMENT:    1. Pre-op evaluation   2. Coronary artery disease involving native coronary artery of native heart without angina pectoris   3. Essential hypertension   4. Hyperlipidemia LDL goal <70   5. Controlled type 2 diabetes mellitus without complication, without long-term current use of insulin ([place])   6. PAD (peripheral artery disease) ([place])   7. Bilateral carotid artery stenosis   8. Cavitating mass in left upper lung lobe    PLAN:    In order of problems listed above:  1. Preoperative clearance: Upcoming carotid artery enterectomy by Dr. Scot Dock.  She previously underwent PCI of RCA in August 2018, she has residual disease in ostial diagonal, distal LAD, and OM.  She has been lost to follow-up for 2 years, however returned today for preoperative clearance.  She denies any recent chest pain or shortness of breath.  Unfortunately, since her admission in April, she has largely been sedentary at home.  She spent majority of the day in the  wheelchair and was only able to get up to go to the bathroom.  Prior to April, she has been able to walk around with a walker.  She says she has been having some intermittent jerking movement in her limb that prevent her from being very steady.  I discussed her case with DOD Dr. Peter Martinique, we felt Myoview at this point is unlikely to lower her overall risk.  Given her comorbidities, she is likely a high risk patient for the intended procedure, however it is not completely prohibitive for her to proceed with the surgery.  Cardiology will be available during perioperative period for any issue that may arise.  2. CAD: Previous PCI of RCA in 2018.  She is on aspirin and Plavix at home.  Her daughter has began to hold her Plavix this morning in anticipation for her carotid surgery  3. Hypertension: Blood pressure stable  4. Hyperlipidemia: Continue Lipitor  5. DM2: Managed by primary care provider  6. PAD: She has both peripheral arterial disease in the lower extremity and also carotid artery disease  7. Carotid artery disease: Anticipation of right carotid enterectomy  8. Left upper lobe lung mass: Previous PET scan concerning for primary adenocarcinoma of the lung.  Followed by pulmonology service.     Medication Adjustments/Labs and Tests Ordered: Current medicines are reviewed at length with the patient today.  Concerns regarding medicines are outlined above.  Orders Placed This Encounter  Procedures  . EKG 12-Lead   No orders of the defined types were placed in this encounter.   Patient Instructions  Medication Instructions:  Your physician recommends that you continue on your current medications as directed. Please refer to the Current Medication list given to you today.  *If you need a refill on your cardiac medications before your next appointment, please call your pharmacy*  Lab Work: NONE ordered at this time of appointment   If you have labs (blood work) drawn today and  your tests are completely normal, you will receive your results only by: Marland Kitchen MyChart Message (if you have MyChart) OR . A paper copy in the mail If you have any lab test that is abnormal or we need to change your treatment, we will call you to review the results.  Testing/Procedures: NONE ordered at this time of appointment   Follow-Up: At [facility], you and your health needs are our priority.  As part of  our continuing mission to provide you with exceptional heart care, we have created designated Provider Care Teams.  These Care Teams include your primary Cardiologist (physician) and Advanced Practice Providers (APPs -  Physician Assistants and Nurse Practitioners) who all work together to provide you with the care you need, when you need it.  We recommend signing up for the patient portal called "MyChart".  Sign up information is provided on this After Visit Summary.  MyChart is used to connect with patients for Virtual Visits (Telemedicine).  Patients are able to view lab/test results, encounter notes, upcoming appointments, etc.  Non-urgent messages can be sent to your provider as well.   To learn more about what you can do with MyChart, go to [email or web address].    Your next appointment:   6 month(s)  The format for your next appointment:   In Person  Provider:   Kirk Ruths, MD  Other Instructions      Signed, Almyra Deforest, [place]  07/08/2020 1:21 PM    [place]

## 2020-07-07 NOTE — Patient Instructions (Signed)
Medication Instructions:  Your physician recommends that you continue on your current medications as directed. Please refer to the Current Medication list given to you today.  *If you need a refill on your cardiac medications before your next appointment, please call your pharmacy*  Lab Work: NONE ordered at this time of appointment   If you have labs (blood work) drawn today and your tests are completely normal, you will receive your results only by: Marland Kitchen MyChart Message (if you have MyChart) OR . A paper copy in the mail If you have any lab test that is abnormal or we need to change your treatment, we will call you to review the results.  Testing/Procedures: NONE ordered at this time of appointment   Follow-Up: At [facility], you and your health needs are our priority.  As part of our continuing mission to provide you with exceptional heart care, we have created designated Provider Care Teams.  These Care Teams include your primary Cardiologist (physician) and Advanced Practice Providers (APPs -  Physician Assistants and Nurse Practitioners) who all work together to provide you with the care you need, when you need it.  We recommend signing up for the patient portal called "MyChart".  Sign up information is provided on this After Visit Summary.  MyChart is used to connect with patients for Virtual Visits (Telemedicine).  Patients are able to view lab/test results, encounter notes, upcoming appointments, etc.  Non-urgent messages can be sent to your provider as well.   To learn more about what you can do with MyChart, go to [email or web address].    Your next appointment:   6 month(s)  The format for your next appointment:   In Person  Provider:   Kirk Ruths, MD  Other Instructions

## 2020-07-07 NOTE — Progress Notes (Signed)
Anesthesia Chart Review:  Patient is a current everyday smoker with 30-pack-year history and is followed by pulmonology for chronic bronchitis, pulmonary nodule.  She is not on oxygen.  Recent PET scan showed hypermetabolic thick-walled cavitary lesion left upper lobe suspicious for bronchogenic carcinoma, no evidence of distant disease.  Parotid lesion was also noted.  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  CT-guided biopsy was planned to evaluate lung nodule, but patient canceled procedure.  She was last seen by pulmonology 04/27/2020 and per note the procedure was reviewed in detail and the patient decided that she would proceed with it.  She has not yet undergone this procedure.  Patient was recently seen by neurology on 05/31/2020 for evaluation of episodes concerning for seizure.  She underwent EEG which was unremarkable.  No epileptiform activity was noted.  She has recently been discovered to have greater than 80% right carotid stenosis.  Per Dr. Nicole Cella note she is asymptomatic but given severity and is recommended she undergo right carotid endarterectomy to lower risk of stroke.  She also has peripheral vascular disease with severe infrainguinal arterial occlusive disease on the right with a small dark area on her right great toe.  She has a history of 2 failed femoral-popliteal bypass graft on the left.  Seen by cardiology 07/06/20 for preop evaluation. Per note, "Patient presents today for preoperative clearance prior to endarterectomy surgery by Dr. Scot Dock.  She denies any recent chest pain or shortness of breath.  She was instructed to hold Plavix for 5 days prior to the surgery, her daughter has held her Plavix starting this morning.  Talking with the patient, prior to April, she was able to ambulate with a walker.  However since April, she spent majority of the day in  wheelchair due to spontaneous occurrence of leg spasm and jerking movement which may cause falling episode if she is not sitting down.  EKG is unchanged when compared to 2018.  Her functional ability is quite limited as she spends majority of the day in the wheelchair.  On physical exam, she is euvolemic and has no lower extremity edema.  She also denies any orthopnea or PND.  She is still smoking however trying to quit and is just started on the Chantix therapy.  I discussed her case with Dr. Peter Martinique DOD today, doing a Myoview at this point is unlikely to lower her overall risk.  Given her comorbidities, patient is likely a high risk candidate for any procedure however per Dr. Martinique, this will not completely prohibit her from having the carotid enterectomy.  Therefore, she is cleared from cardiac perspective as a high risk patient.  Cardiology service will be available as needed during perioperative period."  Patient reportedly a difficult stick and blood was unable to be obtained at preadmission testing appointment. Discussed with Jovita Kussmaul, RN. Labs to be drawn DOS.  EKG 07/07/2020: NSR.  Rate 75.  LVH with repull abnormality.  Cannot rule out septal infarct, age undetermined.  Carotid Duplex 06/15/20: Summary:  Right Carotid: Velocities in the right ICA are consistent with a 80-99% stenosis.  Non-hemodynamically significant plaque <50% noted in the CCA.  Vertebrals: Right vertebral artery demonstrates antegrade flow.  Subclavians: Normal flow hemodynamics were seen in the right subclavian artery.   Cath and PCI 06/14/17: Severe ostial RCA disease - PCI with DES Severe ostial & proximal D1 (  large vessel) - not favorable for PCI, but could consider PTCA. Moderate ostial OM2 & Om2. Normal EF Systemic HTN with mild-moderately elevated LVEDP.   Plan: Transfer to Post-procedure unit for post PCI care & TR Band Removal  Continue ASA/Plavix for minimum of 6 months - could temporarily hold  Plavix at that time for procedure.  Continue aggressive RF modifications - BP control, lipids, glycemic control.  Monitor for contrast hypersensitivity.   Wynonia Musty [facility] Short Stay Center/Anesthesiology Phone [phone] 07/08/2020 10:18 AM

## 2020-07-08 ENCOUNTER — Encounter: Payer: Self-pay | Admitting: Physician Assistant

## 2020-07-08 ENCOUNTER — Other Ambulatory Visit
Admission: RE | Admit: 2020-07-08 | Discharge: 2020-07-08 | Disposition: A | Discharge status: Home / Self Care | Payer: Medicare HMO | Source: Ambulatory Visit | Attending: Vascular Surgery | Admitting: Vascular Surgery

## 2020-07-08 DIAGNOSIS — Z01812 Encounter for preprocedural laboratory examination: Secondary | ICD-10-CM | POA: Diagnosis not present

## 2020-07-08 DIAGNOSIS — Z20822 Contact with and (suspected) exposure to covid-19: Secondary | ICD-10-CM | POA: Insufficient documentation

## 2020-07-08 NOTE — Anesthesia Preprocedure Evaluation (Deleted)
Anesthesia Evaluation    Airway        Dental   Pulmonary Current Smoker,           Cardiovascular hypertension,      Neuro/Psych    GI/Hepatic   Endo/Other  diabetes  Renal/GU      Musculoskeletal   Abdominal   Peds  Hematology   Anesthesia Other Findings   Reproductive/Obstetrics                             Anesthesia Physical Anesthesia Plan  ASA:   Anesthesia Plan:    Post-op Pain Management:    Induction:   PONV Risk Score and Plan:   Airway Management Planned:   Additional Equipment:   Intra-op Plan:   Post-operative Plan:   Informed Consent:   Plan Discussed with:   Anesthesia Plan Comments: (PAT note by Karoline Caldwell, PA-C: Patient is a current everyday smoker with 30-pack-year history and is followed by pulmonology for chronic bronchitis, pulmonary nodule.  She is not on oxygen.  Recent PET scan showed hypermetabolic thick-walled cavitary lesion left upper lobe suspicious for bronchogenic carcinoma, no evidence of distant disease.  Parotid lesion was also noted.  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  CT-guided biopsy was planned to evaluate lung nodule, but patient canceled procedure.  She was last seen by pulmonology 04/27/2020 and per note the procedure was reviewed in detail and the patient decided that she would proceed with it.  She has not yet undergone this procedure.  Patient was recently seen by neurology on 05/31/2020 for evaluation of episodes concerning for seizure.  She underwent EEG which was unremarkable.  No epileptiform activity was noted.  She has recently been discovered to have greater than 80% right carotid stenosis.  Per Dr. Nicole Cella note she is asymptomatic but given severity and is recommended she undergo right carotid  endarterectomy to lower risk of stroke.  She also has peripheral vascular disease with severe infrainguinal arterial occlusive disease on the right with a small dark area on her right great toe.  She has a history of 2 failed femoral-popliteal bypass graft on the left.  Seen by cardiology 07/06/20 for preop evaluation. Per note, "Patient presents today for preoperative clearance prior to endarterectomy surgery by Dr. Scot Dock.  She denies any recent chest pain or shortness of breath.  She was instructed to hold Plavix for 5 days prior to the surgery, her daughter has held her Plavix starting this morning.  Talking with the patient, prior to April, she was able to ambulate with a walker.  However since April, she spent majority of the day in wheelchair due to spontaneous occurrence of leg spasm and jerking movement which may cause falling episode if she is not sitting down.  EKG is unchanged when compared to 2018.  Her functional ability is quite limited as she spends majority of the day in the wheelchair.  On physical exam, she is euvolemic and has no lower extremity edema.  She also denies any orthopnea or PND.  She is still smoking however trying to quit and is just started on the Chantix therapy.  I discussed her case with Dr. Peter Martinique DOD today, doing a Myoview at this point is unlikely to lower her overall risk.  Given her comorbidities, patient is likely a high  risk candidate for any procedure however per Dr. Martinique, this will not completely prohibit her from having the carotid enterectomy.  Therefore, she is cleared from cardiac perspective as a high risk patient.  Cardiology service will be available as needed during perioperative period."  Patient reportedly a difficult stick and blood was unable to be obtained at preadmission testing appointment. Discussed with Jovita Kussmaul, RN. Labs to be drawn DOS.  EKG 07/07/2020: NSR.  Rate 75.  LVH with repull abnormality.  Cannot rule out septal infarct, age  undetermined.  Carotid Duplex 06/15/20: Summary:  Right Carotid: Velocities in the right ICA are consistent with a 80-99% stenosis.  Non-hemodynamically significant plaque <50% noted in the CCA.  Vertebrals: Right vertebral artery demonstrates antegrade flow.  Subclavians: Normal flow hemodynamics were seen in the right subclavian artery.   Cath and PCI 06/14/17: Severe ostial RCA disease - PCI with DES Severe ostial & proximal D1 (large vessel) - not favorable for PCI, but could consider PTCA. Moderate ostial OM2 & Om2. Normal EF Systemic HTN with mild-moderately elevated LVEDP.   Plan: Transfer to Post-procedure unit for post PCI care & TR Band Removal Continue ASA/Plavix for minimum of 6 months - could temporarily hold Plavix at that time for procedure. Continue aggressive RF modifications - BP control, lipids, glycemic control. Monitor for contrast hypersensitivity.  )        Anesthesia Quick Evaluation

## 2020-07-09 LAB — SARS CORONAVIRUS 2 (TAT 6-24 HRS): SARS Coronavirus 2: NEGATIVE

## 2020-07-11 ENCOUNTER — Ambulatory Visit: Payer: Medicare HMO | Admitting: Internal Medicine

## 2020-07-12 ENCOUNTER — Inpatient Hospital Stay (HOSPITAL_COMMUNITY): Admission: RE | Admit: 2020-07-12 | Payer: Medicare HMO | Source: Home / Self Care | Admitting: Vascular Surgery

## 2020-07-12 ENCOUNTER — Encounter (HOSPITAL_COMMUNITY): Admission: RE | Payer: Self-pay | Source: Home / Self Care

## 2020-07-12 DIAGNOSIS — I7025 Atherosclerosis of native arteries of other extremities with ulceration: Secondary | ICD-10-CM | POA: Diagnosis not present

## 2020-07-12 DIAGNOSIS — Z6822 Body mass index (BMI) 22.0-22.9, adult: Secondary | ICD-10-CM | POA: Diagnosis not present

## 2020-07-12 DIAGNOSIS — R55 Syncope and collapse: Secondary | ICD-10-CM | POA: Diagnosis not present

## 2020-07-12 DIAGNOSIS — R2689 Other abnormalities of gait and mobility: Secondary | ICD-10-CM | POA: Diagnosis not present

## 2020-07-12 SURGERY — ENDARTERECTOMY, CAROTID
Anesthesia: General | Laterality: Right

## 2020-07-14 ENCOUNTER — Other Ambulatory Visit: Payer: Self-pay

## 2020-07-14 DIAGNOSIS — E1122 Type 2 diabetes mellitus with diabetic chronic kidney disease: Secondary | ICD-10-CM | POA: Diagnosis not present

## 2020-07-14 DIAGNOSIS — Z720 Tobacco use: Secondary | ICD-10-CM | POA: Diagnosis not present

## 2020-07-14 DIAGNOSIS — I5032 Chronic diastolic (congestive) heart failure: Secondary | ICD-10-CM | POA: Diagnosis not present

## 2020-07-14 DIAGNOSIS — J449 Chronic obstructive pulmonary disease, unspecified: Secondary | ICD-10-CM | POA: Diagnosis not present

## 2020-07-14 DIAGNOSIS — I6523 Occlusion and stenosis of bilateral carotid arteries: Secondary | ICD-10-CM

## 2020-07-14 MED ORDER — DIPHENHYDRAMINE HCL 50 MG PO CAPS: ORAL_CAPSULE | ORAL | 0 refills | Status: DC

## 2020-07-14 MED ORDER — PREDNISONE 50 MG PO TABS: ORAL_TABLET | ORAL | 0 refills | Status: DC

## 2020-07-15 ENCOUNTER — Other Ambulatory Visit: Payer: Self-pay

## 2020-07-15 MED ORDER — DIPHENHYDRAMINE HCL 50 MG PO CAPS: ORAL_CAPSULE | ORAL | 0 refills | Status: DC

## 2020-07-15 MED ORDER — PREDNISONE 50 MG PO TABS: ORAL_TABLET | ORAL | 0 refills | Status: DC

## 2020-08-02 ENCOUNTER — Encounter: Payer: Self-pay | Admitting: Internal Medicine

## 2020-08-05 ENCOUNTER — Ambulatory Visit
Admission: RE | Admit: 2020-08-05 | Discharge: 2020-08-05 | Disposition: A | Discharge status: Home / Self Care | Payer: Medicare HMO | Source: Ambulatory Visit | Attending: Vascular Surgery | Admitting: Vascular Surgery

## 2020-08-05 DIAGNOSIS — I6523 Occlusion and stenosis of bilateral carotid arteries: Secondary | ICD-10-CM

## 2020-08-08 ENCOUNTER — Telehealth: Payer: Self-pay

## 2020-08-08 NOTE — Telephone Encounter (Signed)
Phone call to patient, spoke with patients caregiver, daughter Levada Dy, to review instructions for 13 hr prep for CT w/ contrast on 08/22/20 at 1220. Prescription called into [facility]. Pt/CG aware and verbalized understanding of instructions. Prescription: 08/21/20 1120 pm- 50mg  Prednisone 08/22/20 520 am- 50mg  Prednisone 08/22/20 1120 am  - 50mg  Prednisone and 50mg  Benadryl

## 2020-08-09 ENCOUNTER — Telehealth: Payer: Self-pay | Admitting: Internal Medicine

## 2020-08-09 NOTE — Telephone Encounter (Signed)
LMTCB for the pt's daughter Vernie Shanks hold in triage to f/u tomorrow 10/27

## 2020-08-09 NOTE — Telephone Encounter (Signed)
I think she should see someone as soon as possible for this. It is getting bigger and they won't come to office or get biopsy. Please schedule with APP this week.

## 2020-08-09 NOTE — Telephone Encounter (Signed)
lmtcb for Sherri Brooks, pt's daughter.   Pt was suppose to be seen in September to discuss biopsy. However, the appt was a no show and never rescheduled.   Dr. Shearon Stalls, please advise if you would like pt to schedule an appt with an APP this week or schedule OV with you first available on 11/18.

## 2020-08-10 ENCOUNTER — Ambulatory Visit: Payer: Medicare HMO | Admitting: Vascular Surgery

## 2020-08-10 NOTE — Telephone Encounter (Signed)
Spoke with Levada Dy and made appt with TP for 08/10/20  Tried to scheduled with Beth since she has seen her before  She could only do afternoon appt tomorrow

## 2020-08-11 ENCOUNTER — Ambulatory Visit: Payer: Medicare HMO | Admitting: Adult Health

## 2020-08-11 ENCOUNTER — Other Ambulatory Visit: Payer: Self-pay

## 2020-08-11 ENCOUNTER — Encounter: Payer: Self-pay | Admitting: Adult Health

## 2020-08-11 DIAGNOSIS — R2689 Other abnormalities of gait and mobility: Secondary | ICD-10-CM | POA: Diagnosis not present

## 2020-08-11 DIAGNOSIS — Z6822 Body mass index (BMI) 22.0-22.9, adult: Secondary | ICD-10-CM | POA: Diagnosis not present

## 2020-08-11 DIAGNOSIS — R55 Syncope and collapse: Secondary | ICD-10-CM | POA: Diagnosis not present

## 2020-08-11 DIAGNOSIS — J984 Other disorders of lung: Secondary | ICD-10-CM | POA: Diagnosis not present

## 2020-08-11 DIAGNOSIS — R634 Abnormal weight loss: Secondary | ICD-10-CM | POA: Diagnosis not present

## 2020-08-11 DIAGNOSIS — J449 Chronic obstructive pulmonary disease, unspecified: Secondary | ICD-10-CM | POA: Insufficient documentation

## 2020-08-11 DIAGNOSIS — I7025 Atherosclerosis of native arteries of other extremities with ulceration: Secondary | ICD-10-CM | POA: Diagnosis not present

## 2020-08-11 DIAGNOSIS — F17200 Nicotine dependence, unspecified, uncomplicated: Secondary | ICD-10-CM | POA: Diagnosis not present

## 2020-08-11 NOTE — Assessment & Plan Note (Signed)
Cavitary left upper lobe lung mass suspicious for lung cancer. Hypermetabolic on PET scan.  Follow-up CT chest shows enlargement and left upper lobe lung mass.  All of this is suspicious for underlying lung cancer. I went over this in detail with patient and her daughter.  We went over in detail that this may be a cancer and as cancers will enlarge and possibly spread with metastasis.  Patient verbalizes understanding but at this time does not wish to proceed with biopsy and definitive diagnosis.  Would also like for patient to have pulmonary function testing to better determine her potential treatment options.  She would like to wait on this but will get this on her return visit in 3 months. Patient does verbalize that she may proceed with biopsy later on to decide if she has cancer however is not convinced that she would undergo any type of treatment such as chemo radiation or surgical options.  At this time patient will contact our office if she decides to change her mind and proceed with the biopsy.  Plan  Patient Instructions  Call back if you change your mind on biopsy of lung mass.  Albuterol inhaler or neb every 6hrs as needed.  Work on not smoking .  Activity as tolerated.  Follow up with Dr. Shearon Stalls in 3 months with PFTs and As needed

## 2020-08-11 NOTE — Patient Instructions (Addendum)
Call back if you change your mind on biopsy of lung mass.  Albuterol inhaler or neb every 6hrs as needed.  Work on not smoking .  Activity as tolerated.  Follow up with Dr. Shearon Stalls in 3 months with PFTs and As needed

## 2020-08-11 NOTE — Assessment & Plan Note (Signed)
Smoking cessation encouraged!

## 2020-08-11 NOTE — Assessment & Plan Note (Signed)
Suspected underlying COPD with heavy smoking history.  Would like to have PFTs to establish underlying lung function.  We will get PFTs on return if patient is able for now continue on albuterol inhaler and nebulizer as needed.  Patient is very sedentary and does not seem to have significant symptom burden.  She is not oxygen dependent.

## 2020-08-11 NOTE — Progress Notes (Signed)
@Patient  ID: Sherri Brooks, female    DOB: 07-Jul-1946, 74 y.o.   MRN: [id number]  Chief Complaint  Patient presents with  . Follow-up    Lung mass     Referring provider: Redmond School, MD  HPI: 74 year old female  smoker seen for pulmonary consult May 2021 for lung mass Medical history significant for chronic bronchitis, stage III chronic kidney disease, anemia, diabetes, peripheral vascular disease   TEST/EVENTS :  PET scan May 2021 showed hypermetabolic thick-walled cavitary lesion left upper lobe consistent with bronchogenic carcinoma with no evidence of distant disease.  Bilateral hypermetabolic lesion on the parotid gland favoring a parotid neoplasm.  CT chest May 20, 2020 showed a 2.6 x 2.1 cm spiculated cavitary mass in the left upper lobe consistent with malignancy.  Does appear to be enlarged.   08/11/2020 Follow up ; Lung mass  Patient presents for a follow-up visit.  Patient was seen earlier this year in May for a pulmonary consult for lung mass.  CT scan showed a cavitary lesion in the left upper lobe.  Subsequent PET scan in May 2021 showed hypermetabolic thick-walled cavitary lesion in left upper lobe concerning for bronchogenic carcinoma.  She was recommended for CT-guided biopsy.  PET also showed a bilateral hypermetabolic lesion in the parotid gland.  She was referred to ENT.  Unfortunately patient did not go for biopsy as recommended.  Follow-up CT chest May 20, 2020 showed a 2.6 x 2.1 cm spiculated cavitary mass with enlargement. Lost 35lbs in last 6 months. Low appetite .   Lives with daughter, sedentary in wheelchair . Hard to walk .  She denies any significant cough or dyspnea at rest.  Although she is not very active.  I went over the bill of test results with patient once again.  I explained that her left upper lobe lung mass is suspicious for possible cancer.  Patient says she did go for the biopsy however did not want to undergo when she got there and was  scared for the potential complications.  We did go over potential surgical complications and advised her that these are standard procedures.  Patient says she does not know if she would even want treatment if she knew that this was a cancer.  She has a lot of medical problems and is concerned about undergoing more treatment. I explained that if this in fact is a cancer that the cancer potentially will grow and possibly spread/metastasize.  Patient and daughter are aware and verbalized understanding.  Patient continues to wish to think about this and not proceed with biopsy at this time. She denies any hemoptysis, chest pain, calf pain. She says that she wishes to go home and think about this and will call back if she decides to proceed.  She does wish to follow-up in 3 months and discuss this again at that time. Patient is active smoker.  Discussed smoking cessation.  Allergies  Allergen Reactions  . Hydrocodone Hives and Itching  . Iohexol Hives         Immunization History  Administered Date(s) Administered  . Influenza, High Dose Seasonal PF 06/28/2019  . PFIZER SARS-COV-2 Vaccination 07/11/2020, 08/03/2020  . Tdap 01/16/2019    Past Medical History:  Diagnosis Date  . Alzheimer disease ([place])   . Anemia   . Anxiety   . Arthritis   . Asthma   . CAD (coronary artery disease)   . Cellulitis of buttock, left 2010  . Diabetes mellitus   .  GERD (gastroesophageal reflux disease)   . Heart murmur   . Hyperlipidemia   . Hypertension   . MI (myocardial infarction) ([place])   . PVD (peripheral vascular disease) ([place])   . Wears glasses     Tobacco History: Social History   Tobacco Use  Smoking Status Current Every Day Smoker  . Packs/day: 0.50  . Years: 30.00  . Pack years: 15.00  . Types: Cigarettes  . Start date: 11/04/2012  Smokeless Tobacco Never Used  Tobacco Comment   trying to cut back, 1/2 a pack 08/11/20   Ready to quit: Not Answered Counseling given: Not  Answered Comment: trying to cut back, 1/2 a pack 08/11/20   Outpatient Medications Prior to Visit  Medication Sig Dispense Refill  . albuterol (PROVENTIL HFA;VENTOLIN HFA) 108 (90 BASE) MCG/ACT inhaler Inhale 2 puffs into the lungs every 6 (six) hours as needed. For bronchitis and coughing    . albuterol (PROVENTIL) (2.5 MG/3ML) 0.083% nebulizer solution Take 3 mLs (2.5 mg total) by nebulization every 6 (six) hours as needed for wheezing or shortness of breath. 75 mL 12  . alendronate (FOSAMAX) 70 MG tablet Take 70 mg by mouth every 7 (seven) days.     . ALPRAZolam (XANAX) 0.5 MG tablet Take 0.5 mg by mouth 3 (three) times daily.    Marland Kitchen aspirin 81 MG chewable tablet Chew 1 tablet (81 mg total) by mouth daily. 30 tablet 0  . atorvastatin (LIPITOR) 20 MG tablet Take 1 tablet (20 mg total) by mouth at bedtime. (Patient taking differently: Take 20 mg by mouth daily. ) 30 tablet 0  . Cholecalciferol (VITAMIN D) 50 MCG (2000 UT) CAPS Take 2,000 Units by mouth daily.    . clopidogrel (PLAVIX) 75 MG tablet Take 75 mg by mouth daily.      . diphenhydrAMINE (BENADRYL) 50 MG capsule Take 50 mg by mouth 1 hour prior to your procedure. 1 capsule 0  . donepezil (ARICEPT) 5 MG tablet Take 5 mg by mouth at bedtime.     . dronabinol (MARINOL) 5 MG capsule Take 5 mg by mouth 2 (two) times daily before lunch and supper.     . escitalopram (LEXAPRO) 10 MG tablet Take 10 mg by mouth daily.    . ferrous sulfate 325 (65 FE) MG tablet Take 1 tablet (325 mg total) by mouth daily with breakfast.  3  . furosemide (LASIX) 20 MG tablet Take 20 mg by mouth every morning.    . insulin glargine (LANTUS) 100 UNIT/ML injection Inject 0.2 mLs (20 Units total) into the skin at bedtime. (Patient taking differently: Inject 30 Units into the skin at bedtime. ) 10 mL 11  . Iron-Vitamins (GERITOL COMPLETE PO) Take 1 tablet by mouth daily.     Marland Kitchen labetalol (NORMODYNE) 300 MG tablet Take 1 tablet (300 mg total) by mouth 2 (two) times  daily. 60 tablet 0  . latanoprost (XALATAN) 0.005 % ophthalmic solution Place 1 drop into both eyes at bedtime.     . Omega-3 Fatty Acids (FISH OIL) 1000 MG CPDR Take 1,000 mg by mouth every other day.     . predniSONE (DELTASONE) 50 MG tablet One tablet (50mg ) 13 hours prior to procedure; one tablet (50mg ) 7 hours prior to procedure and then one tablet (50 mg) one hour prior to procedure. 3 tablet 0  . varenicline (CHANTIX CONTINUING MONTH PAK) 1 MG tablet Take 1 tablet (1 mg total) by mouth 2 (two) times daily. 60 tablet 5  .  amLODipine (NORVASC) 5 MG tablet Take 1 tablet (5 mg total) by mouth daily. (Patient not taking: Reported on 08/11/2020) 30 tablet 0   No facility-administered medications prior to visit.     Review of Systems:   Constitutional:   No  weight loss, night sweats,  Fevers, chills,  +fatigue, or  lassitude.  HEENT:   No headaches,  Difficulty swallowing,  Tooth/dental problems, or  Sore throat,                No sneezing, itching, ear ache, nasal congestion, post nasal drip,   CV:  No chest pain,  Orthopnea, PND, swelling in lower extremities, anasarca, dizziness, palpitations, syncope.   GI  No heartburn, indigestion, abdominal pain, nausea, vomiting, diarrhea, change in bowel habits, loss of appetite, bloody stools.   Resp:   No chest wall deformity  Skin: no rash or lesions.  GU: no dysuria, change in color of urine, no urgency or frequency.  No flank pain, no hematuria   MS:  No joint pain or swelling.  No decreased range of motion.  No back pain.    Physical Exam  BP (!) 120/50 (BP Location: Right Arm, Patient Position: Sitting, Cuff Size: Normal)   Pulse 78   Temp (!) 97.3 F (36.3 C) (Temporal)   Ht 5' (1.524 m)   Wt 105 lb 9.6 oz (47.9 kg)   SpO2 100%   BMI 20.62 kg/m   GEN: A/Ox3; pleasant , NAD, thin frail female in wheelchair   HEENT:  [place]/AT,    [place]-clear, THROAT-clear, no lesions, no postnasal drip or exudate noted.   NECK:  Supple w/  fair ROM; no JVD; normal carotid impulses + bilateral bruits; no thyromegaly or nodules palpated; no lymphadenopathy.    RESP  Clear  P & A; w/o, wheezes/ rales/ or rhonchi. no accessory muscle use, no dullness to percussion  CARD:  RRR, no m/r/g, no peripheral edema, pulses intact, no cyanosis or clubbing.  GI:   Soft & nt; nml bowel sounds; no organomegaly or masses detected.   Musco: Warm bil, no deformities or joint swelling noted.   Neuro: alert, no focal deficits noted.    Skin: Warm, no lesions or rashes    Imaging: No results found.    No flowsheet data found.  No results found for: NITRICOXIDE      Assessment & Plan:   Cavitating mass in left upper lung lobe Cavitary left upper lobe lung mass suspicious for lung cancer. Hypermetabolic on PET scan.  Follow-up CT chest shows enlargement and left upper lobe lung mass.  All of this is suspicious for underlying lung cancer. I went over this in detail with patient and her daughter.  We went over in detail that this may be a cancer and as cancers will enlarge and possibly spread with metastasis.  Patient verbalizes understanding but at this time does not wish to proceed with biopsy and definitive diagnosis.  Would also like for patient to have pulmonary function testing to better determine her potential treatment options.  She would like to wait on this but will get this on her return visit in 3 months. Patient does verbalize that she may proceed with biopsy later on to decide if she has cancer however is not convinced that she would undergo any type of treatment such as chemo radiation or surgical options.  At this time patient will contact our office if she decides to change her mind and proceed with the biopsy.  Plan  Patient Instructions  Call back if you change your mind on biopsy of lung mass.  Albuterol inhaler or neb every 6hrs as needed.  Work on not smoking .  Activity as tolerated.  Follow up with Dr. Shearon Stalls  in 3 months with PFTs and As needed         Weight loss, unintentional Ongoing weight loss.  Protein calorie malnutrition.  Encouraged on a high-protein diet.  Tobacco use disorder Smoking cessation encouraged  COPD (chronic obstructive pulmonary disease) (HCC) Suspected underlying COPD with heavy smoking history.  Would like to have PFTs to establish underlying lung function.  We will get PFTs on return if patient is able for now continue on albuterol inhaler and nebulizer as needed.  Patient is very sedentary and does not seem to have significant symptom burden.  She is not oxygen dependent.     Rexene Edison, NP 08/11/2020

## 2020-08-11 NOTE — Assessment & Plan Note (Signed)
Ongoing weight loss.  Protein calorie malnutrition.  Encouraged on a high-protein diet.

## 2020-08-13 DIAGNOSIS — Z87891 Personal history of nicotine dependence: Secondary | ICD-10-CM | POA: Diagnosis not present

## 2020-08-13 DIAGNOSIS — J449 Chronic obstructive pulmonary disease, unspecified: Secondary | ICD-10-CM | POA: Diagnosis not present

## 2020-08-13 DIAGNOSIS — F4542 Pain disorder with related psychological factors: Secondary | ICD-10-CM | POA: Diagnosis not present

## 2020-08-13 DIAGNOSIS — G894 Chronic pain syndrome: Secondary | ICD-10-CM | POA: Diagnosis not present

## 2020-08-22 ENCOUNTER — Other Ambulatory Visit: Payer: Self-pay | Admitting: Vascular Surgery

## 2020-08-22 ENCOUNTER — Ambulatory Visit
Admission: RE | Admit: 2020-08-22 | Discharge: 2020-08-22 | Disposition: A | Discharge status: Home / Self Care | Payer: Medicare HMO | Source: Ambulatory Visit | Attending: Vascular Surgery | Admitting: Vascular Surgery

## 2020-08-22 DIAGNOSIS — E042 Nontoxic multinodular goiter: Secondary | ICD-10-CM | POA: Diagnosis not present

## 2020-08-22 DIAGNOSIS — I6523 Occlusion and stenosis of bilateral carotid arteries: Secondary | ICD-10-CM | POA: Diagnosis not present

## 2020-08-22 DIAGNOSIS — Z8673 Personal history of transient ischemic attack (TIA), and cerebral infarction without residual deficits: Secondary | ICD-10-CM | POA: Diagnosis not present

## 2020-08-22 DIAGNOSIS — R42 Dizziness and giddiness: Secondary | ICD-10-CM | POA: Diagnosis not present

## 2020-08-22 MED ORDER — IOPAMIDOL (ISOVUE-370) INJECTION 76%
75.0000 mL | Freq: Once | INTRAVENOUS | Status: AC | PRN
  Administered 2020-08-22: 50 mL via INTRAVENOUS

## 2020-08-23 ENCOUNTER — Telehealth: Payer: Self-pay

## 2020-08-23 DIAGNOSIS — I959 Hypotension, unspecified: Secondary | ICD-10-CM | POA: Diagnosis not present

## 2020-08-23 DIAGNOSIS — R58 Hemorrhage, not elsewhere classified: Secondary | ICD-10-CM | POA: Diagnosis not present

## 2020-08-23 DIAGNOSIS — W19XXXA Unspecified fall, initial encounter: Secondary | ICD-10-CM | POA: Diagnosis not present

## 2020-08-23 DIAGNOSIS — F10929 Alcohol use, unspecified with intoxication, unspecified: Secondary | ICD-10-CM | POA: Diagnosis not present

## 2020-08-23 DIAGNOSIS — I10 Essential (primary) hypertension: Secondary | ICD-10-CM | POA: Diagnosis not present

## 2020-08-23 NOTE — Telephone Encounter (Signed)
Daughter was made aware and verbalized understanding.

## 2020-08-23 NOTE — Telephone Encounter (Signed)
-----   Message from Angelia Mould, MD sent at 08/23/2020  7:18 AM EST ----- Regarding: RE: CTA She does not need a CT of head. Thanks Gerald Stabs  ----- Message ----- From: Kaleen Mask, LPN Sent: [id number]   2:41 PM EST To: Angelia Mould, MD Subject: CTA                                            Hey Dr. Scot Dock.  Anderson Malta at [facility] called and left a message saying patient came in today for CTA Head/Neck (Carotid Stenosis).  "Her creatinine was Extra High and her GFR was really low (31).  They had to really reduce the dye to have the CTA of the neck done.  They were unable to do the CTA Head today.  Pts daughter said she already had an MRI of the head which was negative.  Does she need to come back for the head CTA? If so we need we will add another order but schedule it out a week.  Please advise.   (Pt is also allergic to IV dye and had to premedicate).   Thanks you,  Tomie Spizzirri.

## 2020-08-31 ENCOUNTER — Ambulatory Visit: Payer: Medicare HMO | Admitting: Vascular Surgery

## 2020-09-01 ENCOUNTER — Ambulatory Visit: Payer: Medicare HMO | Admitting: Vascular Surgery

## 2020-09-01 ENCOUNTER — Other Ambulatory Visit: Payer: Self-pay

## 2020-09-01 ENCOUNTER — Encounter: Payer: Self-pay | Admitting: Vascular Surgery

## 2020-09-01 VITALS — BP 155/74 | HR 65 | Temp 94.6°F | Resp 14 | Ht 63.0 in | Wt 105.0 lb

## 2020-09-01 DIAGNOSIS — I6523 Occlusion and stenosis of bilateral carotid arteries: Secondary | ICD-10-CM

## 2020-09-01 DIAGNOSIS — I739 Peripheral vascular disease, unspecified: Secondary | ICD-10-CM | POA: Diagnosis not present

## 2020-09-01 IMAGING — CT CT CHEST W/O CM
2 of 4 series · 12 of 36 positions shown, 15 images · non-contrast
Comparison: [DATE] [DATE], [DATE].  [DATE] [DATE], [DATE].

CLINICAL DATA: Lung nodule.

EXAM:
CT CHEST WITHOUT CONTRAST
TECHNIQUE: Multidetector CT imaging of the chest was performed following the
standard protocol without IV contrast.

[Series 2: chest 2.00 br40 s3 · axial · 0.48mm/px · z∈[+1455,+1719]mm · 9 of 156 slices shown, 12 images (1 of 2)]
[im 12/156  mediastinal]
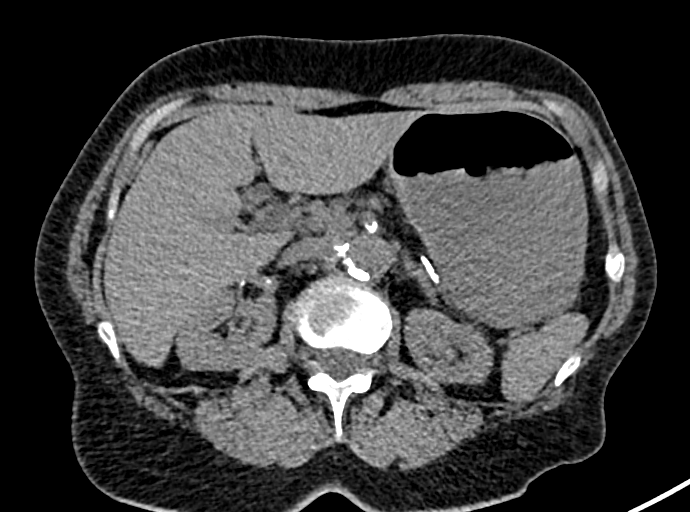
[im 12/156  lung]
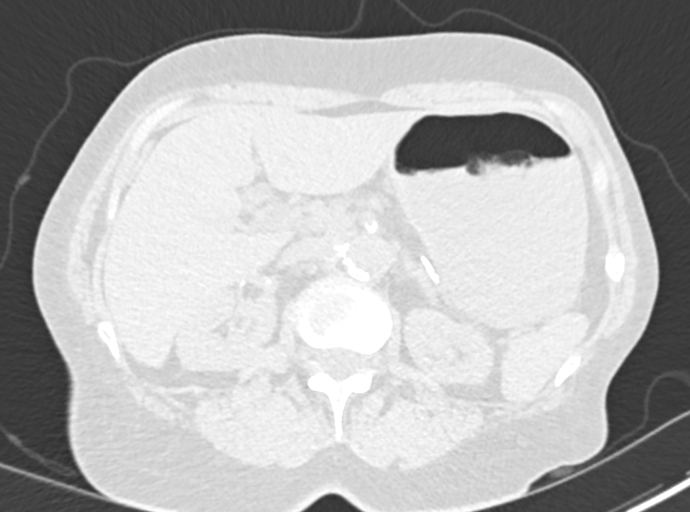
[im 36/156  lung]
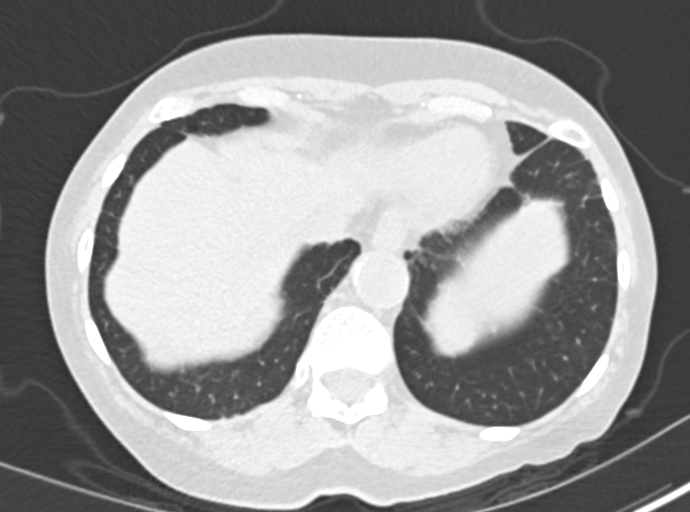
[im 48/156  lung]
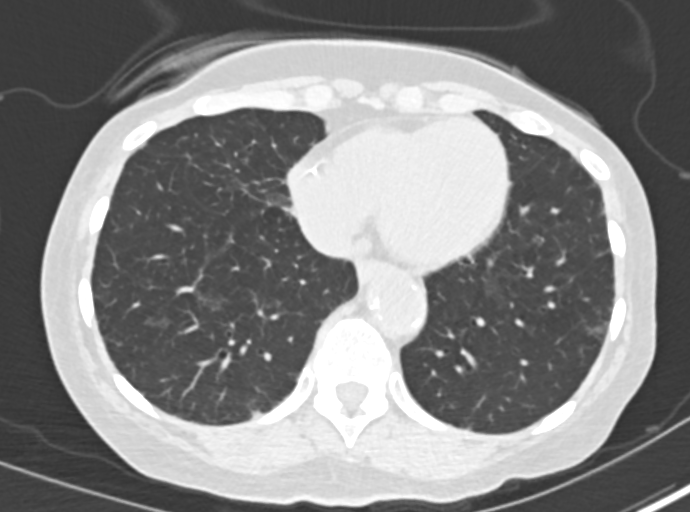
[im 60/156  lung]
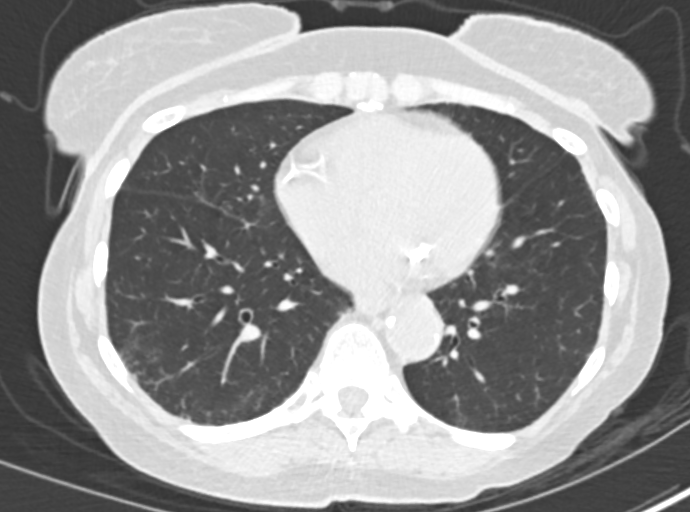
[im 84/156  mediastinal]
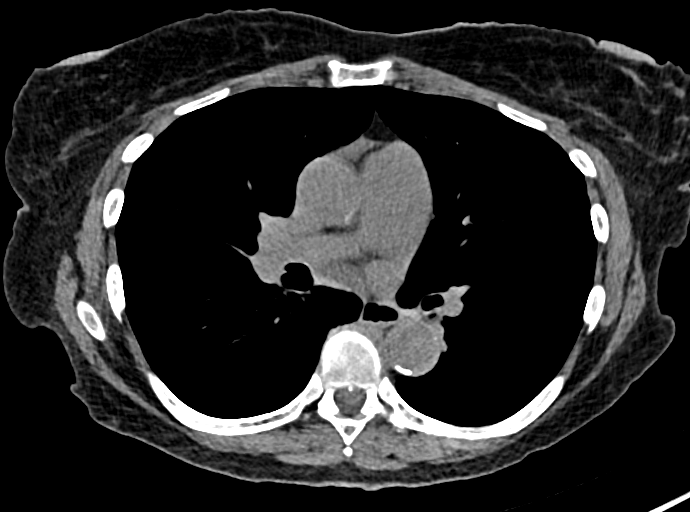
[im 84/156  lung]
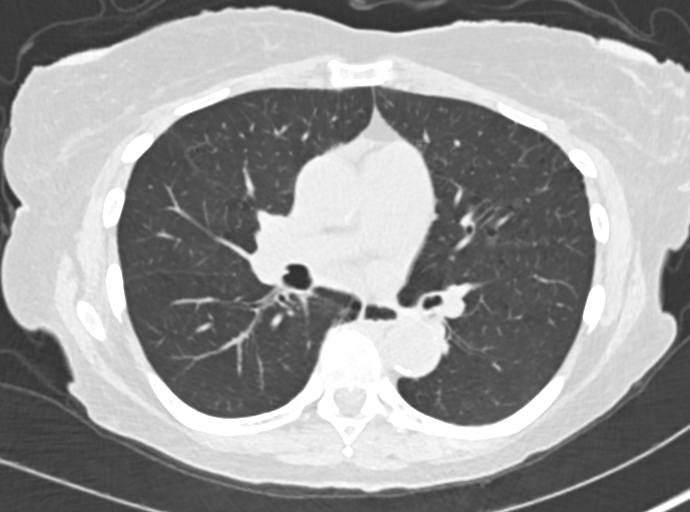
[im 96/156  lung]
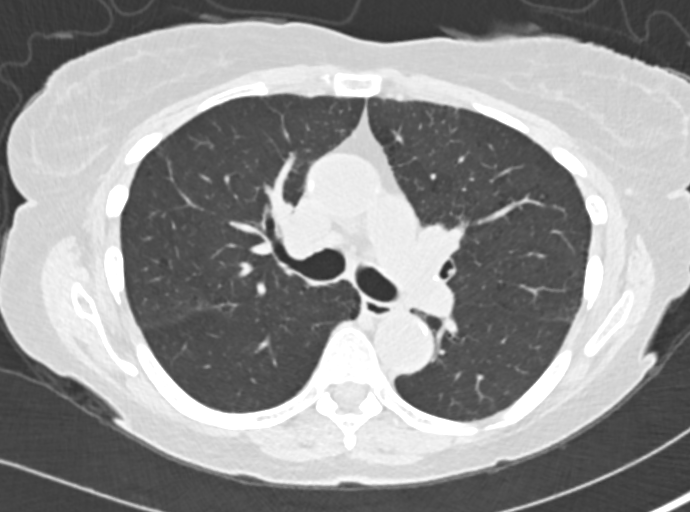
[im 108/156  lung]
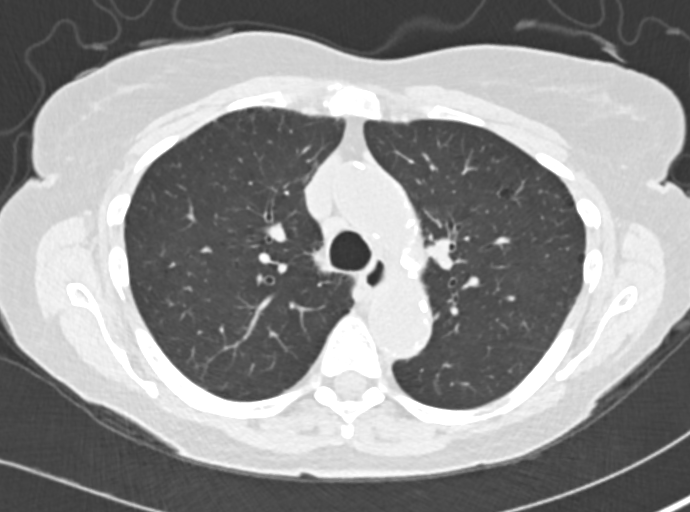
[im 132/156  lung]
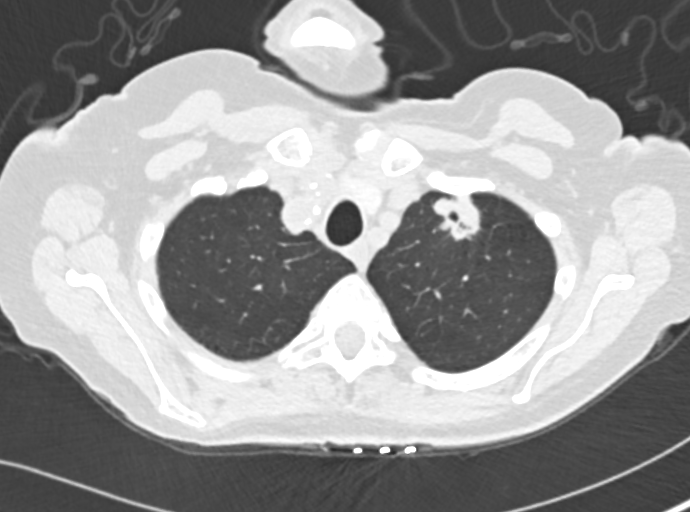
[im 144/156  mediastinal]
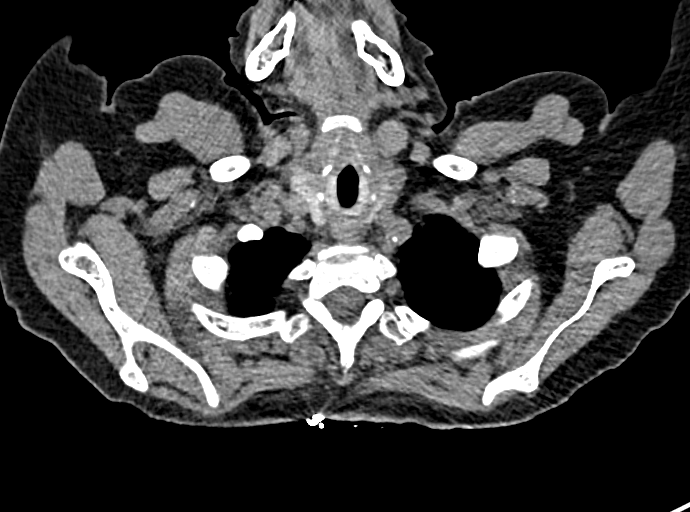
[im 144/156  lung]
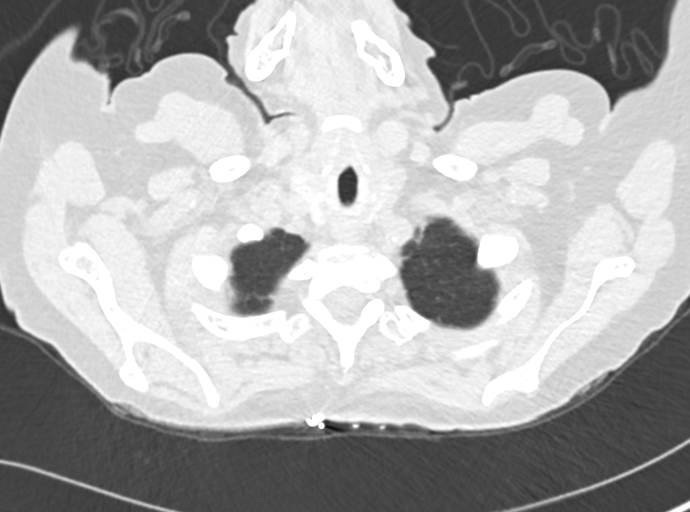

[Series 4: chest 2.00 br40 s3 · coronal · 0.61mm/px · 3 of 123 slices shown (2 of 2)]
[im 25/123  lung]
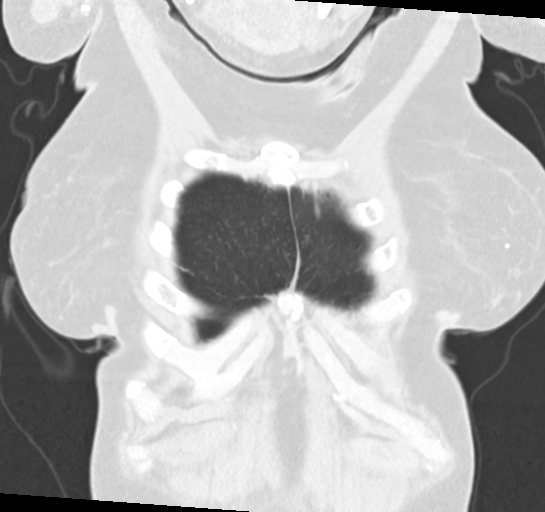
[im 49/123  lung]
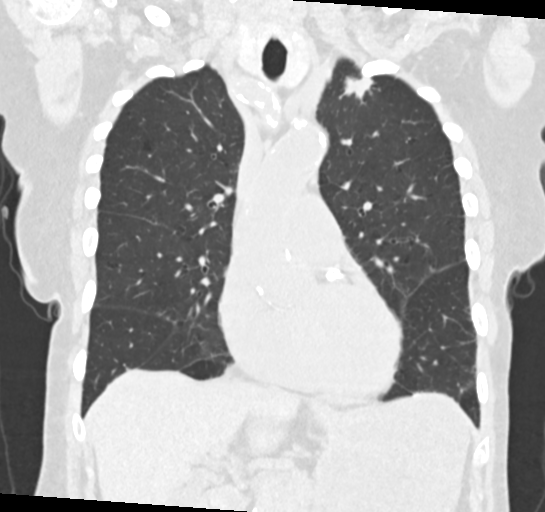
[im 74/123  lung]
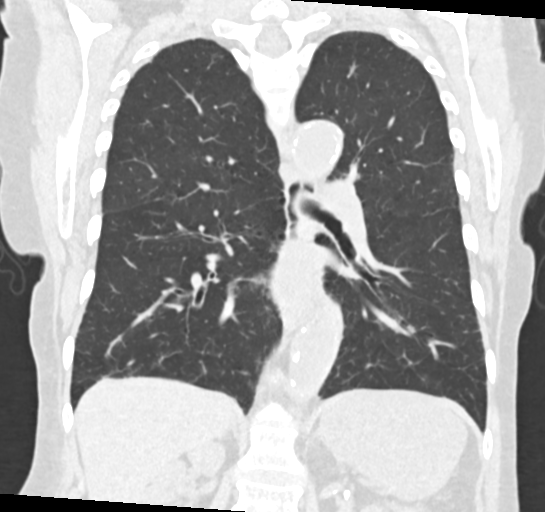

[12 of 36 positions shown; findings below may reference images not displayed]

FINDINGS: Cardiovascular: Atherosclerosis of thoracic aorta is noted without
aneurysm formation. Normal cardiac size is noted. No pericardial
effusion is noted. Coronary artery calcifications are noted.

Mediastinum/Nodes: No enlarged mediastinal or axillary lymph nodes.
Thyroid gland, trachea, and esophagus demonstrate no significant
findings.

Lungs/Pleura: No pneumothorax or pleural effusion is noted. 2.6 x
2.1 cm spiculated cavitary abnormality is noted in the left upper
lobe consistent with malignancy as described on prior PET scan and
CT scan. It appears to be enlarged compared to prior exam. No
consolidative process is noted. Mild emphysematous disease is noted.

Upper Abdomen: No acute abnormality.

Musculoskeletal: No chest wall mass or suspicious bone lesions
identified.
IMPRESSION: 1. 2.6 x 2.1 cm spiculated cavitary abnormality is noted in the left
upper lobe consistent with malignancy as described on prior PET scan
and CT scan. It appears to be enlarged compared to prior exam. These
results will be called to the ordering clinician or representative
by the Radiologist Assistant, and communication documented in the
PACS or zVision Dashboard.
2. Coronary artery calcifications are noted suggesting coronary
artery disease.

Aortic Atherosclerosis (Z2C2E-P7M.M) and Emphysema (Z2C2E-X6A.0).

## 2020-09-01 NOTE — Progress Notes (Signed)
REASON FOR VISIT:   Follow-up of peripheral vascular disease and bilateral carotid disease  MEDICAL ISSUES:   BILATERAL CAROTID DISEASE: This patient has a tight greater than 80% right carotid stenosis. She has a 50 to 69% left carotid stenosis. Currently I think that the right carotid stenosis is asymptomatic however given the severity of the stenosis she is at increased risk for stroke. However based on her CT angiogram I do not think that the stenosis is surgically accessible. In addition she may not be a candidate for transcarotid stenting given that she has significant calcific disease in her common carotid artery and also distal internal carotid artery. I will review the films with [place]. For now however I would continue aggressive medical treatment including aspirin, Plavix, and a statin. The situation is further complicated by a 2.6 cm x 2.1 cm spiculated cavitary mass in the left upper lobe consistent with malignancy. The patient does not want to pursue an aggressive approach at this point and did not want a biopsy. I believe she is scheduled for a follow-up CT scan in January. This certainly needs to be factored into the management of her carotid disease. If she is a candidate for transcarotid stenting that I will discuss this option with her. However, it may be better to wait until her follow-up study in January of her lung cancer to better get a feel for her prognosis.  PERIPHERAL VASCULAR DISEASE: The patient does have multilevel arterial occlusive disease on the right but the wound on the foot is stable and she is essentially asymptomatic with no claudication or rest pain. Granted her activity is very limited given her debilitated state. I am going to see her back in 3 months and I have ordered follow-up ABIs at that time. We have again discussed the importance of tobacco cessation.  DIZZINESS: She has had some problems with dizziness and I suspect this may be related to her  vertebral basilar disease. She has an occluded right vertebral artery with a stenosis in the left vertebral artery. I think if she is having persistent symptoms pending the results of her follow-up CT for her potential lung cancer she could be considered for vertebral artery angioplasty by Dr. Estanislado Pandy.   HPI:   Sherri Brooks is a pleasant 74 y.o. female who I last saw on 06/15/2020 for follow-up of her peripheral vascular disease. She also was noted to have a greater than 80% right carotid stenosis.  With respect to her peripheral vascular disease she has a complicated history. 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 One was with vein and one was with a prosthetic graft. In August 2018 she had angioplasty of the left external iliac artery. In September 2018 she underwent angioplasty and stenting from the left common femoral artery to the tibial peroneal trunk. This was for rest pain of the left foot. She was subsequently hospitalized in [place] with seizure activity in June and a carotid duplex scan at that time showed a greater than 70% right carotid stenosis and a 50 to 69% left carotid stenosis. When I saw her back in September she had a wound on the tip of her right great toe.  With respect to her peripheral vascular disease I felt that we needed to follow the wound on her right great toe closely as this could become a limb threatening problem. I felt that her options for revascularization on the right would be limited but we would need to  consider arteriography once her carotid issue was resolved.  With respect to her right carotid stenosis she was found to have a greater than 80% right carotid stenosis. Based on my history this was asymptomatic. She had a significant cardiac history and we will going to get her cleared from a cardiac standpoint. In addition there had been a Psychologist, sport and exercise Covid cases in the hospital we were trying to delay elective surgery. She was on aspirin,  Plavix, and a statin.  In reviewing her notes looks like she was found to have a cavitary left upper lobe lung mass suspicious for lung cancer.  Past Medical History:  Diagnosis Date  . Alzheimer disease ([place])   . Anemia   . Anxiety   . Arthritis   . Asthma   . CAD (coronary artery disease)   . Cellulitis of buttock, left 2010  . Diabetes mellitus   . GERD (gastroesophageal reflux disease)   . Heart murmur   . Hyperlipidemia   . Hypertension   . MI (myocardial infarction) ([place])   . PVD (peripheral vascular disease) ([place])   . Wears glasses     Family History  Problem Relation Age of Onset  . Brain cancer Father   . Cancer Father   . Diabetes Mother        many family members  . Alzheimer's disease Mother   . Heart disease Other     SOCIAL HISTORY: Social History   Tobacco Use  . Smoking status: Current Every Day Smoker    Packs/day: 0.50    Years: 30.00    Pack years: 15.00    Types: Cigarettes    Start date: 11/04/2012  . Smokeless tobacco: Never Used  . Tobacco comment: trying to cut back, 1/2 a pack 08/11/20  Substance Use Topics  . Alcohol use: No    Alcohol/week: 0.0 standard drinks    Allergies  Allergen Reactions  . Hydrocodone Hives and Itching  . Iohexol Hives         Current Outpatient Medications  Medication Sig Dispense Refill  . albuterol (PROVENTIL HFA;VENTOLIN HFA) 108 (90 BASE) MCG/ACT inhaler Inhale 2 puffs into the lungs every 6 (six) hours as needed. For bronchitis and coughing    . albuterol (PROVENTIL) (2.5 MG/3ML) 0.083% nebulizer solution Take 3 mLs (2.5 mg total) by nebulization every 6 (six) hours as needed for wheezing or shortness of breath. 75 mL 12  . alendronate (FOSAMAX) 70 MG tablet Take 70 mg by mouth every 7 (seven) days.     . ALPRAZolam (XANAX) 0.5 MG tablet Take 0.5 mg by mouth 3 (three) times daily.    Marland Kitchen aspirin 81 MG chewable tablet Chew 1 tablet (81 mg total) by mouth daily. 30 tablet 0  . atorvastatin (LIPITOR) 20  MG tablet Take 1 tablet (20 mg total) by mouth at bedtime. (Patient taking differently: Take 20 mg by mouth daily. ) 30 tablet 0  . Cholecalciferol (VITAMIN D) 50 MCG (2000 UT) CAPS Take 2,000 Units by mouth daily.    . clopidogrel (PLAVIX) 75 MG tablet Take 75 mg by mouth daily.      . diphenhydrAMINE (BENADRYL) 50 MG capsule Take 50 mg by mouth 1 hour prior to your procedure. 1 capsule 0  . donepezil (ARICEPT) 5 MG tablet Take 5 mg by mouth at bedtime.     . dronabinol (MARINOL) 5 MG capsule Take 5 mg by mouth 2 (two) times daily before lunch and supper.     . escitalopram (  LEXAPRO) 10 MG tablet Take 10 mg by mouth daily.    . ferrous sulfate 325 (65 FE) MG tablet Take 1 tablet (325 mg total) by mouth daily with breakfast.  3  . furosemide (LASIX) 20 MG tablet Take 20 mg by mouth every morning.    . insulin glargine (LANTUS) 100 UNIT/ML injection Inject 0.2 mLs (20 Units total) into the skin at bedtime. (Patient taking differently: Inject 30 Units into the skin at bedtime. ) 10 mL 11  . Iron-Vitamins (GERITOL COMPLETE PO) Take 1 tablet by mouth daily.     Marland Kitchen labetalol (NORMODYNE) 300 MG tablet Take 1 tablet (300 mg total) by mouth 2 (two) times daily. 60 tablet 0  . latanoprost (XALATAN) 0.005 % ophthalmic solution Place 1 drop into both eyes at bedtime.     . Omega-3 Fatty Acids (FISH OIL) 1000 MG CPDR Take 1,000 mg by mouth every other day.     . predniSONE (DELTASONE) 50 MG tablet One tablet (50mg ) 13 hours prior to procedure; one tablet (50mg ) 7 hours prior to procedure and then one tablet (50 mg) one hour prior to procedure. 3 tablet 0  . varenicline (CHANTIX CONTINUING MONTH PAK) 1 MG tablet Take 1 tablet (1 mg total) by mouth 2 (two) times daily. 60 tablet 5   No current facility-administered medications for this visit.    REVIEW OF SYSTEMS:  [X]  denotes positive finding, [ ]  denotes negative finding Cardiac  Comments:  Chest pain or chest pressure:    Shortness of breath upon  exertion:    Short of breath when lying flat:    Irregular heart rhythm:        Vascular    Pain in calf, thigh, or hip brought on by ambulation:    Pain in feet at night that wakes you up from your sleep:     Blood clot in your veins:    Leg swelling:         Pulmonary    Oxygen at home:    Productive cough:     Wheezing:         Neurologic    Sudden weakness in arms or legs:     Sudden numbness in arms or legs:     Sudden onset of difficulty speaking or slurred speech:    Temporary loss of vision in one eye:     Problems with dizziness:         Gastrointestinal    Blood in stool:     Vomited blood:         Genitourinary    Burning when urinating:     Blood in urine:        Psychiatric    Major depression:         Hematologic    Bleeding problems:    Problems with blood clotting too easily:        Skin    Rashes or ulcers:        Constitutional    Fever or chills:     PHYSICAL EXAM:   Vitals:   09/01/20 1432 09/01/20 1435  BP: 124/66 (!) 155/74  Pulse: 64 65  Resp: 14   Temp: (!) 94.6 F (34.8 C)   TempSrc: Temporal   SpO2: 100%   Weight: 105 lb (47.6 kg)   Height: 5\' 3"  (1.6 m)     GENERAL: The patient is a well-nourished female, in no acute distress. The vital signs are documented above. CARDIAC: There  is a regular rate and rhythm.  VASCULAR: I do not detect carotid bruits. She has a diminished right femoral pulse with a normal left femoral pulse. I cannot palpate pedal pulses. PULMONARY: There is good air exchange bilaterally without wheezing or rales. ABDOMEN: Soft and non-tender with normal pitched bowel sounds.  MUSCULOSKELETAL: There are no major deformities or cyanosis. NEUROLOGIC: No focal weakness or paresthesias are detected. SKIN: She has a wound on the tip of her right great toe as documented in the photograph below.      PSYCHIATRIC: The patient has a normal affect.  DATA:    CT ANGIOGRAM NECK: The patient had a CT angiogram  of the neck and I reviewed these images. This shows a greater than 80% right carotid stenosis. There is significant calcific disease in the common carotid artery and also in the internal carotid artery distally. There is a 60% left carotid stenosis. The right vertebral artery is occluded and there is a stenosis in the proximal left vertebral artery.  Deitra Mayo [facility] [phone]

## 2020-09-02 ENCOUNTER — Other Ambulatory Visit: Payer: Self-pay

## 2020-09-02 DIAGNOSIS — I739 Peripheral vascular disease, unspecified: Secondary | ICD-10-CM

## 2020-09-10 ENCOUNTER — Emergency Department (HOSPITAL_COMMUNITY)
Admission: EM | Admit: 2020-09-10 | Discharge: 2020-09-10 | Disposition: A | Discharge status: Home / Self Care | Payer: Medicare HMO | Attending: Emergency Medicine | Admitting: Emergency Medicine

## 2020-09-10 ENCOUNTER — Emergency Department (HOSPITAL_COMMUNITY): Payer: Medicare HMO

## 2020-09-10 ENCOUNTER — Encounter (HOSPITAL_COMMUNITY): Payer: Self-pay | Admitting: *Deleted

## 2020-09-10 ENCOUNTER — Other Ambulatory Visit: Payer: Self-pay

## 2020-09-10 DIAGNOSIS — F17210 Nicotine dependence, cigarettes, uncomplicated: Secondary | ICD-10-CM | POA: Insufficient documentation

## 2020-09-10 DIAGNOSIS — N289 Disorder of kidney and ureter, unspecified: Secondary | ICD-10-CM | POA: Diagnosis not present

## 2020-09-10 DIAGNOSIS — Z794 Long term (current) use of insulin: Secondary | ICD-10-CM | POA: Insufficient documentation

## 2020-09-10 DIAGNOSIS — G309 Alzheimer's disease, unspecified: Secondary | ICD-10-CM | POA: Diagnosis not present

## 2020-09-10 DIAGNOSIS — I2510 Atherosclerotic heart disease of native coronary artery without angina pectoris: Secondary | ICD-10-CM | POA: Diagnosis not present

## 2020-09-10 DIAGNOSIS — N183 Chronic kidney disease, stage 3 unspecified: Secondary | ICD-10-CM | POA: Diagnosis not present

## 2020-09-10 DIAGNOSIS — J45909 Unspecified asthma, uncomplicated: Secondary | ICD-10-CM | POA: Insufficient documentation

## 2020-09-10 DIAGNOSIS — R0789 Other chest pain: Secondary | ICD-10-CM | POA: Diagnosis not present

## 2020-09-10 DIAGNOSIS — I129 Hypertensive chronic kidney disease with stage 1 through stage 4 chronic kidney disease, or unspecified chronic kidney disease: Secondary | ICD-10-CM | POA: Insufficient documentation

## 2020-09-10 DIAGNOSIS — R079 Chest pain, unspecified: Secondary | ICD-10-CM

## 2020-09-10 DIAGNOSIS — Z79899 Other long term (current) drug therapy: Secondary | ICD-10-CM | POA: Diagnosis not present

## 2020-09-10 DIAGNOSIS — Z7982 Long term (current) use of aspirin: Secondary | ICD-10-CM | POA: Diagnosis not present

## 2020-09-10 DIAGNOSIS — Z955 Presence of coronary angioplasty implant and graft: Secondary | ICD-10-CM | POA: Insufficient documentation

## 2020-09-10 DIAGNOSIS — E860 Dehydration: Secondary | ICD-10-CM | POA: Diagnosis not present

## 2020-09-10 DIAGNOSIS — E119 Type 2 diabetes mellitus without complications: Secondary | ICD-10-CM | POA: Diagnosis not present

## 2020-09-10 LAB — URINALYSIS, ROUTINE W REFLEX MICROSCOPIC
Bilirubin Urine: NEGATIVE
Glucose, UA: NEGATIVE mg/dL
Hgb urine dipstick: NEGATIVE
Ketones, ur: NEGATIVE mg/dL
Leukocytes,Ua: NEGATIVE
Nitrite: NEGATIVE
Protein, ur: NEGATIVE mg/dL
Specific Gravity, Urine: 1.014 (ref 1.005–1.030)
pH: 5 (ref 5.0–8.0)

## 2020-09-10 LAB — BASIC METABOLIC PANEL
Anion gap: 10 (ref 5–15)
BUN: 20 mg/dL (ref 8–23)
CO2: 23 mmol/L (ref 22–32)
Calcium: 9.2 mg/dL (ref 8.9–10.3)
Chloride: 105 mmol/L (ref 98–111)
Creatinine, Ser: 1.67 mg/dL — ABNORMAL HIGH (ref 0.44–1.00)
GFR, Estimated: 32 mL/min — ABNORMAL LOW (ref >60)
Glucose, Bld: 135 mg/dL — ABNORMAL HIGH (ref 70–99)
Potassium: 3.3 mmol/L — ABNORMAL LOW (ref 3.5–5.1)
Sodium: 138 mmol/L (ref 135–145)

## 2020-09-10 LAB — CBC
HCT: 34.9 % — ABNORMAL LOW (ref 36.0–46.0)
Hemoglobin: 11.2 g/dL — ABNORMAL LOW (ref 12.0–15.0)
MCH: 28.9 pg (ref 26.0–34.0)
MCHC: 32.1 g/dL (ref 30.0–36.0)
MCV: 89.9 fL (ref 80.0–100.0)
Platelets: 264 10*3/uL (ref 150–400)
RBC: 3.88 MIL/uL (ref 3.87–5.11)
RDW: 18 % — ABNORMAL HIGH (ref 11.5–15.5)
WBC: 7.2 10*3/uL (ref 4.0–10.5)
nRBC: 0 % (ref 0.0–0.2)

## 2020-09-10 LAB — TROPONIN I (HIGH SENSITIVITY)
Troponin I (High Sensitivity): 16 ng/L (ref <18)
Troponin I (High Sensitivity): 17 ng/L (ref <18)

## 2020-09-10 LAB — POC OCCULT BLOOD, ED: Fecal Occult Bld: NEGATIVE

## 2020-09-10 MED ORDER — SODIUM CHLORIDE 0.9 % IV SOLN: INTRAVENOUS | Status: DC

## 2020-09-10 MED ORDER — LABETALOL HCL 200 MG PO TABS
300.0000 mg | ORAL_TABLET | Freq: Two times a day (BID) | ORAL | Status: DC
  Administered 2020-09-10: 300 mg via ORAL
  Filled 2020-09-10: qty 2

## 2020-09-10 NOTE — ED Provider Notes (Signed)
[facility] EMERGENCY DEPARTMENT Provider Note   CSN: [id number] Arrival date & time: 09/10/20  1349     History Chief Complaint  Patient presents with  . Chest Pain    Sherri Brooks is a 74 y.o. female.  HPI   This patient is a 74 year old female, she has a history of Alzheimer's disease, she has cognitive delay, level 5 caveat applies, the patient is not able to give me a clear history which is mostly obtained from her daughter with whom she lives.  She is a diabetic, has hypertension and hyperlipidemia, prior myocardial infarction with a stent on Plavix.  She has a known history of a cavitary lesion in the left upper lobe which is thought to be malignant which the patient has refused further evaluation and biopsy.  She presents to the hospital today with a couple of complaints, this is mostly driven by the daughter who is concerned about her because she has been generally weak for 3 days not getting out of bed that often, she has not been eating that much, she has had a recent urinary tract infection finishing her antibiotics over 1 week ago, she has had some black stools which have been intermittent for several weeks, she is not on iron though she does have occasional Pepto-Bismol.  She also has been having some intermittent chest pain which she states is mild, the patient cannot explain it to me, she denies having any chest pain at this time.  Level 5 caveat applies secondary to cognitive deficit  I have reviewed this patient's medical record including the chest x-ray which I personally viewed as well as the prior x-rays, this does appear to be a slightly larger.  The patient continues to smoke..  A PET scan from May 2021 showed that this was a hypermetabolic thick-walled cavitary lesion consistent with bronchogenic carcinoma.  I do not see any prior cardiology notes in the system, she has had some notes over time with vascular surgery, podiatry, oncology and pulmonology.  She is  currently not wishing to pursue any treatment for this bronchogenic carcinoma  Past Medical History:  Diagnosis Date  . Alzheimer disease ([place])   . Anemia   . Anxiety   . Arthritis   . Asthma   . CAD (coronary artery disease)   . Cellulitis of buttock, left 2010  . Diabetes mellitus   . GERD (gastroesophageal reflux disease)   . Heart murmur   . Hyperlipidemia   . Hypertension   . MI (myocardial infarction) ([place])   . PVD (peripheral vascular disease) ([place])   . Wears glasses     Patient Active Problem List   Diagnosis Date Noted  . COPD (chronic obstructive pulmonary disease) ([place]) 08/11/2020  . Nodule of parotid gland 04/27/2020  . CKD (chronic kidney disease) stage 3, GFR 30-59 ml/min (HCC) 01/22/2020  . Memory loss   . Acute metabolic encephalopathy 40/98/1191  . Cavitating mass in left upper lung lobe 01/21/2020  . Acute kidney injury ([place]) 06/10/2017  . Dyspnea 06/10/2017  . Essential hypertension 06/10/2017  . PVD (peripheral vascular disease) ([place])   . Ankle fracture, bimalleolar, closed 12/29/2012  . Anemia of unknown etiology 11/06/2012  . Atherosclerotic PVD with ulceration ([place]) 11/04/2012  . Diabetes mellitus with complication ([place]) 47/82/9562  . Tobacco use disorder 11/04/2012  . Open wound of left foot 11/04/2012  . Atherosclerosis of native arteries of the extremities with ulceration ([place]) 07/09/2012  . Weight loss, unintentional 09/18/2011  . Anorexia 09/18/2011  .  Early satiety 09/18/2011  . 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/07/2011    Past Surgical History:  Procedure Laterality Date  . ABDOMINAL AORTAGRAM N/A 07/28/2012   Procedure: ABDOMINAL Maxcine Ham;  Surgeon: Angelia Mould, MD;  Location: [facility] C[facility] LAB;  Service: Cardiovascular;  Laterality: N/A;  . ABDOMINAL AORTOGRAM W/LOWER EXTREMITY N/A 06/10/2017   Procedure: ABDOMINAL AORTOGRAM W/LOWER EXTREMITY;  Surgeon: Angelia Mould, MD;  Location: [place] CV LAB;  Service:  Cardiovascular;  Laterality: N/A;  . ABDOMINAL HYSTERECTOMY    . COLONOSCOPY  10/26/2011   Procedure: COLONOSCOPY;  Surgeon: Dorothyann Peng, MD;  [facility];  Service: Endoscopy;  Laterality: N/A;  1:30  . CORONARY STENT INTERVENTION N/A 06/14/2017   Procedure: CORONARY STENT INTERVENTION;  Surgeon: Leonie Man, MD;  Location: [place] CV LAB;  Service: Cardiovascular;  Laterality: N/A;  . FEMORAL-POPLITEAL BYPASS GRAFT  07/2010   pt has had 3 fem-pop bpg  . FEMORAL-POPLITEAL BYPASS GRAFT  11/04/2012   Procedure: BYPASS GRAFT FEMORAL-POPLITEAL ARTERY;  Surgeon: Angelia Mould, MD;  Location: [facility] OR;  Service: Vascular;  Laterality: Left;  Redo Left Femoral Popliteal Bypass with PTFE  . FOOT AMPUTATION  2010   left  . LEFT HEART CATH AND CORONARY ANGIOGRAPHY N/A 06/14/2017   Procedure: LEFT HEART CATH AND CORONARY ANGIOGRAPHY;  Surgeon: Leonie Man, MD;  Location: [place] CV LAB;  Service: Cardiovascular;  Laterality: N/A;  . LOWER EXTREMITY ANGIOGRAM Bilateral 07/28/2012   Procedure: LOWER EXTREMITY ANGIOGRAM;  Surgeon: Angelia Mould, MD;  Location: [facility] CATH LAB;  Service: Cardiovascular;  Laterality: Bilateral;  . LOWER EXTREMITY ANGIOGRAM Left 01/02/2016   Procedure: Lower Extremity Angiogram;  Surgeon: Angelia Mould, MD;  Location: [place] CV LAB;  Service: Cardiovascular;  Laterality: Left;  . LOWER EXTREMITY ANGIOGRAM Left 07/03/2017   Procedure: Lower Extremity Angiogram;  Surgeon: Waynetta Sandy, MD;  Location: [place] CV LAB;  Service: Cardiovascular;  Laterality: Left;  . LOWER EXTREMITY INTERVENTION Left 07/03/2017   Procedure: Lower Extremity Intervention;  Surgeon: Waynetta Sandy, MD;  Location: [place] CV LAB;  Service: Cardiovascular;  Laterality: Left;  . ORIF ANKLE FRACTURE Left 01/27/2013   Procedure: OPEN REDUCTION INTERNAL FIXATION (ORIF) ANKLE FRACTURE;  Surgeon: Wylene Simmer, MD;  Location: [place];  Service: Orthopedics;  Laterality: Left;  . PERCUTANEOUS STENT INTERVENTION Left 07/28/2012   Procedure: PERCUTANEOUS STENT INTERVENTION;  Surgeon: Angelia Mould, MD;  Location: [facility] CATH LAB;  Service: Cardiovascular;  Laterality: Left;  lt ext tiliac stentx1  . PERIPHERAL VASCULAR BALLOON ANGIOPLASTY Left 06/10/2017   Procedure: PERIPHERAL VASCULAR BALLOON ANGIOPLASTY;  Surgeon: Angelia Mould, MD;  Location: [place] CV LAB;  Service: Cardiovascular;  Laterality: Left;  external iliac  . PERIPHERAL VASCULAR CATHETERIZATION N/A 01/02/2016   Procedure: Abdominal Aortogram;  Surgeon: Angelia Mould, MD;  Location: [place] CV LAB;  Service: Cardiovascular;  Laterality: N/A;  . PERIPHERAL VASCULAR CATHETERIZATION Left 01/02/2016   Procedure: Peripheral Vascular Intervention;  Surgeon: Angelia Mould, MD;  Location: [place] CV LAB;  Service: Cardiovascular;  Laterality: Left;  lt ext iliac  . right common iliac stent  10/2009   Dr Doren Custard ([place])  . S/P Hysterecotmy     partial  . TONSILLECTOMY    . VASCULAR SURGERY       OB History   No obstetric history on file.     Family History  Problem Relation Age of Onset  .  Brain cancer Father   . Cancer Father   . Diabetes Mother        many family members  . Alzheimer's disease Mother   . Heart disease Other     Social History   Tobacco Use  . Smoking status: Current Every Day Smoker    Packs/day: 0.50    Years: 30.00    Pack years: 15.00    Types: Cigarettes    Start date: 11/04/2012  . Smokeless tobacco: Never Used  . Tobacco comment: trying to cut back, 1/2 a pack 08/11/20  Vaping Use  . Vaping Use: Never assessed  Substance Use Topics  . Alcohol use: No    Alcohol/week: 0.0 standard drinks  . Drug use: No    Home Medications Prior to Admission medications   Medication Sig Start Date End Date Taking? Authorizing Provider  clopidogrel (PLAVIX) 75 MG tablet Take 75 mg  by mouth daily.     Yes [provider]  donepezil (ARICEPT) 5 MG tablet Take 5 mg by mouth at bedtime.  04/01/20  Yes [provider]  dronabinol (MARINOL) 5 MG capsule Take 5 mg by mouth 2 (two) times daily before lunch and supper.  04/02/20  Yes [provider]  insulin glargine (LANTUS) 100 UNIT/ML injection Inject 0.2 mLs (20 Units total) into the skin at bedtime. Patient taking differently: Inject 30 Units into the skin at bedtime.  06/15/17  Yes Velvet Bathe, MD  latanoprost (XALATAN) 0.005 % ophthalmic solution Place 1 drop into both eyes at bedtime.  01/01/20  Yes [provider]  albuterol (PROVENTIL HFA;VENTOLIN HFA) 108 (90 BASE) MCG/ACT inhaler Inhale 2 puffs into the lungs every 6 (six) hours as needed. For bronchitis and coughing    [provider]  albuterol (PROVENTIL) (2.5 MG/3ML) 0.083% nebulizer solution Take 3 mLs (2.5 mg total) by nebulization every 6 (six) hours as needed for wheezing or shortness of breath. 02/25/20   Spero Geralds, MD  alendronate (FOSAMAX) 70 MG tablet Take 70 mg by mouth every 7 (seven) days.  08/30/11   [provider]  ALPRAZolam Duanne Moron) 0.5 MG tablet Take 0.5 mg by mouth 3 (three) times daily. 01/01/20   [provider]  aspirin 81 MG chewable tablet Chew 1 tablet (81 mg total) by mouth daily. 06/16/17   Velvet Bathe, MD  atorvastatin (LIPITOR) 20 MG tablet Take 1 tablet (20 mg total) by mouth at bedtime. Patient taking differently: Take 20 mg by mouth daily.  06/15/17   Velvet Bathe, MD  Cholecalciferol (VITAMIN D) 50 MCG (2000 UT) CAPS Take 2,000 Units by mouth daily.    [provider]  diphenhydrAMINE (BENADRYL) 50 MG capsule Take 50 mg by mouth 1 hour prior to your procedure. 07/15/20   Angelia Mould, MD  escitalopram (LEXAPRO) 10 MG tablet Take 10 mg by mouth daily. 06/28/20   [provider]  ferrous sulfate 325 (65 FE) MG tablet Take 1 tablet (325 mg total) by mouth  daily with breakfast. 01/24/20   Tat, Shanon Brow, MD  furosemide (LASIX) 20 MG tablet Take 20 mg by mouth every morning. 01/01/20   [provider]  Iron-Vitamins (GERITOL COMPLETE PO) Take 1 tablet by mouth daily.  Patient not taking: Reported on 09/01/2020    [provider]  labetalol (NORMODYNE) 300 MG tablet Take 1 tablet (300 mg total) by mouth 2 (two) times daily. 06/15/17   Velvet Bathe, MD  Omega-3 Fatty Acids (FISH OIL) 1000 MG CPDR  Take 1,000 mg by mouth every other day.     [provider]  predniSONE (DELTASONE) 50 MG tablet One tablet (50mg ) 13 hours prior to procedure; one tablet (50mg ) 7 hours prior to procedure and then one tablet (50 mg) one hour prior to procedure. 07/15/20   Angelia Mould, MD  varenicline (CHANTIX CONTINUING MONTH PAK) 1 MG tablet Take 1 tablet (1 mg total) by mouth 2 (two) times daily. Patient not taking: Reported on 09/01/2020 02/25/20   Spero Geralds, MD    Allergies    Hydrocodone and Iohexol  Review of Systems   Review of Systems  Unable to perform ROS: Dementia    Physical Exam Updated Vital Signs BP (!) 184/61   Pulse 66   Temp 98.1 F (36.7 C) (Oral)   Resp 16   Ht 1.6 m (5\' 3" )   Wt 45.4 kg   SpO2 100%   BMI 17.71 kg/m   Physical Exam Vitals and nursing note reviewed.  Constitutional:      General: She is not in acute distress.    Appearance: She is well-developed.  HENT:     Head: Normocephalic and atraumatic.     Mouth/Throat:     Pharynx: No oropharyngeal exudate.  Eyes:     General: No scleral icterus.       Right eye: No discharge.        Left eye: No discharge.     Conjunctiva/sclera: Conjunctivae normal.     Pupils: Pupils are equal, round, and reactive to light.  Neck:     Thyroid: No thyromegaly.     Vascular: No JVD.  Cardiovascular:     Rate and Rhythm: Normal rate and regular rhythm.     Heart sounds: Normal heart sounds. No murmur heard.  No friction rub. No gallop.     Pulmonary:     Effort: Pulmonary effort is normal. No respiratory distress.     Breath sounds: Normal breath sounds. No wheezing or rales.  Abdominal:     General: Bowel sounds are normal. There is no distension.     Palpations: Abdomen is soft. There is no mass.     Tenderness: There is no abdominal tenderness.  Musculoskeletal:        General: No tenderness. Normal range of motion.     Cervical back: Normal range of motion and neck supple.  Lymphadenopathy:     Cervical: No cervical adenopathy.  Skin:    General: Skin is warm and dry.     Findings: No erythema or rash.  Neurological:     Mental Status: She is alert.     Coordination: Coordination normal.  Psychiatric:        Behavior: Behavior normal.     ED Results / Procedures / Treatments   Labs (all labs ordered are listed, but only abnormal results are displayed) Labs Reviewed  BASIC METABOLIC PANEL - Abnormal; Notable for the following components:      Result Value   Potassium 3.3 (*)    Glucose, Bld 135 (*)    Creatinine, Ser 1.67 (*)    GFR, Estimated 32 (*)    All other components within normal limits  CBC - Abnormal; Notable for the following components:   Hemoglobin 11.2 (*)    HCT 34.9 (*)    RDW 18.0 (*)    All other components within normal limits  URINALYSIS, ROUTINE W REFLEX MICROSCOPIC  POC OCCULT BLOOD, ED  TROPONIN I (HIGH SENSITIVITY)  TROPONIN I (HIGH SENSITIVITY)    EKG EKG Interpretation  Date/Time:  Saturday September 10 2020 14:06:20 EST Ventricular Rate:  68 PR Interval:  138 QRS Duration: 96 QT Interval:  430 QTC Calculation: 457 R Axis:   26 Text Interpretation: Normal sinus rhythm Anteroseptal infarct , age undetermined Abnormal ECG no acute ST findings. Confirmed by Noemi Chapel [id number]) on 09/10/2020 3:38:58 PM   Radiology DG Chest Portable 1 View  Result Date: 09/10/2020 CLINICAL DATA:  Chest pain for 2 days. EXAM: PORTABLE CHEST 1 VIEW COMPARISON:  CT  chest 05/19/2020  FINDINGS: The heart size and mediastinal contours are within normal limits. Aortic atherosclerosis. Cavitary lesion within the left apex is again noted. This has a transverse diameter of approximately 3.5 cm. Previously 2.5 cm. No pleural effusion or edema. No airspace consolidation. The visualized skeletal structures are unremarkable. IMPRESSION: 1. No acute cardiopulmonary abnormalities. 2. Mild increase in size of left apex cavitary lung lesion. This is been previously evaluated by PET-CT and is concerning for pulmonary malignancy. Electronically Signed   By: Kerby Moors M.D.   On: 09/10/2020 14:34    Procedures Procedures (including critical care time)  Medications Ordered in ED Medications  0.9 %  sodium chloride infusion ( Intravenous New Bag/Given 09/10/20 1612)  labetalol (NORMODYNE) tablet 300 mg (300 mg Oral Given 09/10/20 1914)    ED Course  I have reviewed the triage vital signs and the nursing notes.  Pertinent labs & imaging results that were available during my care of the patient were reviewed by me and considered in my medical decision making (see chart for details).  Clinical Course as of Sep 10 2004  Sat Sep 10, 2020  1612 Chaperone present for rectal exam, patient has normal-appearing anus without hemorrhoids or fissures, there is no masses palpated on the internal rectal exam, stool was oranges brown, Hemoccult negative.   [BM]  1612 The patient does have follow-up with gastroenterology within the next couple of weeks according to the daughter   [BM]  2003 The patient did have a slightly elevated creatinine compared to baseline, IV fluids were given.  Blood counts were unremarkable, urinalysis was clean and showed no signs of ketones, fecal occult was negative, blood pressure medication was given, this patient appears stable for discharge to follow-up in the outpatient setting   [BM]    Clinical Course User Index [BM] Noemi Chapel, MD   MDM  Rules/Calculators/A&P                          This patient does not appear to be in any distress, her EKG is essentially unchanged and there is no signs of ST elevation to suggest ischemia.  She has mild hypertension at 142/100, I will order some labs, troponin and check for Hemoccult.  Patient is agreeable to the plan  Final Clinical Impression(s) / ED Diagnoses Final diagnoses:  Renal insufficiency  Dehydration  Chest pain at rest    Rx / DC Orders ED Discharge Orders    None       Noemi Chapel, MD 09/10/20 2005

## 2020-09-10 NOTE — ED Notes (Signed)
Pt in bed, pt states that she is generally weak and not eating much, pt oriented to person and place, pt doesn't know day of the week, re oriented pt.  Pt moving all extremities, pt nsr on cardiac monitor.

## 2020-09-10 NOTE — ED Triage Notes (Signed)
Pt with mid CP x 2 days,  Black stools for a week.  Pt with recent UTI and has finished the antibiotics.  N/V last week.  Denies any sob.

## 2020-09-10 NOTE — ED Notes (Signed)
Pt in bed, pt had small loose bm, cleaned pt and changed depends.  Pt denies pain at this time, family at bedside

## 2020-09-10 NOTE — Discharge Instructions (Signed)
You do not have any blood in your stool You're not having a heart attack Your testing was reassuring but did show a little bit of dehydration for which we gave you IV fluids Please talk to your doctor within the next two or 3 days for recheck, make sure you're taking your blood pressure medication.  I did give you your nighttime dose of labetalol so do not take this again when you get home tonight.  Emergency department for severe or worsening symptoms

## 2020-09-11 DIAGNOSIS — I7025 Atherosclerosis of native arteries of other extremities with ulceration: Secondary | ICD-10-CM | POA: Diagnosis not present

## 2020-09-11 DIAGNOSIS — R2689 Other abnormalities of gait and mobility: Secondary | ICD-10-CM | POA: Diagnosis not present

## 2020-09-11 DIAGNOSIS — R55 Syncope and collapse: Secondary | ICD-10-CM | POA: Diagnosis not present

## 2020-09-11 DIAGNOSIS — Z6822 Body mass index (BMI) 22.0-22.9, adult: Secondary | ICD-10-CM | POA: Diagnosis not present

## 2020-09-13 DIAGNOSIS — J449 Chronic obstructive pulmonary disease, unspecified: Secondary | ICD-10-CM | POA: Diagnosis not present

## 2020-09-13 DIAGNOSIS — E1122 Type 2 diabetes mellitus with diabetic chronic kidney disease: Secondary | ICD-10-CM | POA: Diagnosis not present

## 2020-09-13 DIAGNOSIS — I5032 Chronic diastolic (congestive) heart failure: Secondary | ICD-10-CM | POA: Diagnosis not present

## 2020-09-13 DIAGNOSIS — Z720 Tobacco use: Secondary | ICD-10-CM | POA: Diagnosis not present

## 2020-09-16 ENCOUNTER — Ambulatory Visit: Payer: Medicare HMO | Admitting: Internal Medicine

## 2020-09-22 DIAGNOSIS — H5200 Hypermetropia, unspecified eye: Secondary | ICD-10-CM | POA: Diagnosis not present

## 2020-09-22 DIAGNOSIS — Z0100 Encounter for examination of eyes and vision without abnormal findings: Secondary | ICD-10-CM | POA: Diagnosis not present

## 2020-10-11 ENCOUNTER — Encounter (INDEPENDENT_AMBULATORY_CARE_PROVIDER_SITE_OTHER): Payer: Medicare HMO | Admitting: Ophthalmology

## 2020-10-11 DIAGNOSIS — R55 Syncope and collapse: Secondary | ICD-10-CM | POA: Diagnosis not present

## 2020-10-11 DIAGNOSIS — Z6822 Body mass index (BMI) 22.0-22.9, adult: Secondary | ICD-10-CM | POA: Diagnosis not present

## 2020-10-11 DIAGNOSIS — R2689 Other abnormalities of gait and mobility: Secondary | ICD-10-CM | POA: Diagnosis not present

## 2020-10-11 DIAGNOSIS — I7025 Atherosclerosis of native arteries of other extremities with ulceration: Secondary | ICD-10-CM | POA: Diagnosis not present

## 2020-10-14 DIAGNOSIS — E1122 Type 2 diabetes mellitus with diabetic chronic kidney disease: Secondary | ICD-10-CM | POA: Diagnosis not present

## 2020-10-14 DIAGNOSIS — I5032 Chronic diastolic (congestive) heart failure: Secondary | ICD-10-CM | POA: Diagnosis not present

## 2020-10-14 DIAGNOSIS — Z720 Tobacco use: Secondary | ICD-10-CM | POA: Diagnosis not present

## 2020-10-14 DIAGNOSIS — J449 Chronic obstructive pulmonary disease, unspecified: Secondary | ICD-10-CM | POA: Diagnosis not present

## 2020-10-18 ENCOUNTER — Telehealth: Payer: Self-pay | Admitting: *Deleted

## 2020-10-18 ENCOUNTER — Encounter (INDEPENDENT_AMBULATORY_CARE_PROVIDER_SITE_OTHER): Payer: Medicare HMO | Admitting: Ophthalmology

## 2020-10-18 NOTE — Telephone Encounter (Signed)
Called and spoke with patient's daughter, Otilio Saber, listed on DPR, scheduled PFT and f/u with Dr. Shearon Stalls for 12/12/2020.  Nothing further needed.

## 2020-10-18 NOTE — Telephone Encounter (Signed)
Attempted to contact patient to schedule pft prior to her 12/12/2020 f/u with Dr. Shearon Stalls.  VM was full, unabble to leave a message.  Will attempt to reach her again to schedule PFT and f/u on the same day.

## 2020-11-11 DIAGNOSIS — Z6822 Body mass index (BMI) 22.0-22.9, adult: Secondary | ICD-10-CM | POA: Diagnosis not present

## 2020-11-11 DIAGNOSIS — R2689 Other abnormalities of gait and mobility: Secondary | ICD-10-CM | POA: Diagnosis not present

## 2020-11-11 DIAGNOSIS — I7025 Atherosclerosis of native arteries of other extremities with ulceration: Secondary | ICD-10-CM | POA: Diagnosis not present

## 2020-11-11 DIAGNOSIS — R55 Syncope and collapse: Secondary | ICD-10-CM | POA: Diagnosis not present

## 2020-11-12 DIAGNOSIS — J449 Chronic obstructive pulmonary disease, unspecified: Secondary | ICD-10-CM | POA: Diagnosis not present

## 2020-11-12 DIAGNOSIS — E1122 Type 2 diabetes mellitus with diabetic chronic kidney disease: Secondary | ICD-10-CM | POA: Diagnosis not present

## 2020-11-12 DIAGNOSIS — Z720 Tobacco use: Secondary | ICD-10-CM | POA: Diagnosis not present

## 2020-11-12 DIAGNOSIS — I5032 Chronic diastolic (congestive) heart failure: Secondary | ICD-10-CM | POA: Diagnosis not present

## 2020-11-27 DIAGNOSIS — E7800 Pure hypercholesterolemia, unspecified: Secondary | ICD-10-CM | POA: Diagnosis not present

## 2020-11-27 DIAGNOSIS — E11649 Type 2 diabetes mellitus with hypoglycemia without coma: Secondary | ICD-10-CM | POA: Diagnosis not present

## 2020-11-27 DIAGNOSIS — M25512 Pain in left shoulder: Secondary | ICD-10-CM | POA: Diagnosis not present

## 2020-11-27 DIAGNOSIS — C3492 Malignant neoplasm of unspecified part of left bronchus or lung: Secondary | ICD-10-CM | POA: Diagnosis not present

## 2020-11-27 DIAGNOSIS — C3412 Malignant neoplasm of upper lobe, left bronchus or lung: Secondary | ICD-10-CM | POA: Diagnosis not present

## 2020-11-27 DIAGNOSIS — E119 Type 2 diabetes mellitus without complications: Secondary | ICD-10-CM | POA: Diagnosis not present

## 2020-11-27 DIAGNOSIS — Z794 Long term (current) use of insulin: Secondary | ICD-10-CM | POA: Diagnosis not present

## 2020-11-27 DIAGNOSIS — I509 Heart failure, unspecified: Secondary | ICD-10-CM | POA: Diagnosis not present

## 2020-11-27 DIAGNOSIS — I110 Hypertensive heart disease with heart failure: Secondary | ICD-10-CM | POA: Diagnosis not present

## 2020-11-29 ENCOUNTER — Telehealth: Payer: Self-pay | Admitting: Internal Medicine

## 2020-11-29 NOTE — Telephone Encounter (Signed)
Pain that  Is so severe it's not responding to narcotics but made worse by arm movement is not a lung problem and needs to see either PCP or orthopedist (if already has one) or try the [facility] ER if all else fails but I'm sorry there is nothing I can do over the phone to sort this out.

## 2020-11-29 NOTE — Telephone Encounter (Signed)
Due to Dr. Shearon Stalls being out of the office this afternoon, due to pt's symptoms sending to provider of the day as well. Dr. Melvyn Novas, please advise.

## 2020-11-29 NOTE — Telephone Encounter (Signed)
Called and spoke with pt's daughter Levada Dy who states pt has been having chest pain for 3-4 days now. She stated that pt went to ED in [place], [place] yesterday 11/28/20 due to the pain.  Levada Dy stated that an EKG was performed and it showed that pt's heart looked okay and she said they were told that the pain pt was having could be a pain from her lungs instead of her heart. Levada Dy also stated that pt does not want to move her left arm due to the pain.  Pt has been using rescue inhaler at least 3-4 times daily.  Levada Dy stated when pt went to the ED, they did prescribe her hydrocodone-acetaminophen to help with the pain which she stated is not helping at all. Pt also had a productive cough so they also prescribed doxycycline.  Pt currently is scheduled to have PFT 2/28 followed by OV with Dr. Shearon Stalls after the PFT.  Levada Dy wanted to know if we had any recs to help with pt's symptoms and also if pt could be seen sooner. Schedules do not work out for pt to have PFT and OV done on same day. Dr. Shearon Stalls, please advise on this if you would be okay with [place] just scheduling pt an OV with you for evaluation and just keep her PFT and OV appts as currently scheduled?

## 2020-11-29 NOTE — Telephone Encounter (Signed)
Spoke with the pt's daughter, Levada Dy and notified of response per Dr Melvyn Novas and she verbalized understanding. Nothing further needed.

## 2020-12-05 DIAGNOSIS — E782 Mixed hyperlipidemia: Secondary | ICD-10-CM | POA: Diagnosis not present

## 2020-12-05 DIAGNOSIS — E1129 Type 2 diabetes mellitus with other diabetic kidney complication: Secondary | ICD-10-CM | POA: Diagnosis not present

## 2020-12-05 DIAGNOSIS — Z681 Body mass index (BMI) 19 or less, adult: Secondary | ICD-10-CM | POA: Diagnosis not present

## 2020-12-05 DIAGNOSIS — E7849 Other hyperlipidemia: Secondary | ICD-10-CM | POA: Diagnosis not present

## 2020-12-05 DIAGNOSIS — Z0000 Encounter for general adult medical examination without abnormal findings: Secondary | ICD-10-CM | POA: Diagnosis not present

## 2020-12-05 DIAGNOSIS — I10 Essential (primary) hypertension: Secondary | ICD-10-CM | POA: Diagnosis not present

## 2020-12-05 DIAGNOSIS — Z1389 Encounter for screening for other disorder: Secondary | ICD-10-CM | POA: Diagnosis not present

## 2020-12-05 DIAGNOSIS — Z1331 Encounter for screening for depression: Secondary | ICD-10-CM | POA: Diagnosis not present

## 2020-12-07 ENCOUNTER — Ambulatory Visit: Payer: Medicare HMO | Admitting: Vascular Surgery

## 2020-12-07 ENCOUNTER — Ambulatory Visit (HOSPITAL_COMMUNITY): Admission: RE | Admit: 2020-12-07 | Payer: Medicare HMO | Source: Ambulatory Visit

## 2020-12-12 ENCOUNTER — Ambulatory Visit: Payer: Medicare HMO | Admitting: Internal Medicine

## 2020-12-12 ENCOUNTER — Telehealth: Payer: Self-pay | Admitting: Internal Medicine

## 2020-12-12 DIAGNOSIS — Z6822 Body mass index (BMI) 22.0-22.9, adult: Secondary | ICD-10-CM | POA: Diagnosis not present

## 2020-12-12 DIAGNOSIS — I130 Hypertensive heart and chronic kidney disease with heart failure and stage 1 through stage 4 chronic kidney disease, or unspecified chronic kidney disease: Secondary | ICD-10-CM | POA: Diagnosis not present

## 2020-12-12 DIAGNOSIS — Z720 Tobacco use: Secondary | ICD-10-CM | POA: Diagnosis not present

## 2020-12-12 DIAGNOSIS — I5032 Chronic diastolic (congestive) heart failure: Secondary | ICD-10-CM | POA: Diagnosis not present

## 2020-12-12 DIAGNOSIS — N183 Chronic kidney disease, stage 3 unspecified: Secondary | ICD-10-CM | POA: Diagnosis not present

## 2020-12-12 DIAGNOSIS — R2689 Other abnormalities of gait and mobility: Secondary | ICD-10-CM | POA: Diagnosis not present

## 2020-12-12 DIAGNOSIS — E1122 Type 2 diabetes mellitus with diabetic chronic kidney disease: Secondary | ICD-10-CM | POA: Diagnosis not present

## 2020-12-12 DIAGNOSIS — I7025 Atherosclerosis of native arteries of other extremities with ulceration: Secondary | ICD-10-CM | POA: Diagnosis not present

## 2020-12-12 DIAGNOSIS — R55 Syncope and collapse: Secondary | ICD-10-CM | POA: Diagnosis not present

## 2020-12-12 DIAGNOSIS — J449 Chronic obstructive pulmonary disease, unspecified: Secondary | ICD-10-CM | POA: Diagnosis not present

## 2020-12-12 NOTE — Telephone Encounter (Signed)
Called and spoke with patient's daughter, Otilio Saber, listed on DPR regarding CT scan done at [facility] in [place], [place].  Advised the daughter that Dr. Shearon Stalls likes to look at the actual images of the CT and would need to call the hospital to request a copy of the actual CT on a disc and bring it to her mom's appointment that has been rescheduled for 01/11/21 after a PFT.  She verbalized understanding.  Nothing further needed.

## 2020-12-31 NOTE — Progress Notes (Deleted)
HPI: Follow-up coronary artery disease.  Previously developed respiratory distress following peripheral vascular arteriogram in 2018.  Echocardiogram showed ejection fraction 65 to 70% with moderate mitral regurgitation.  Cardiac catheterization revealed 80% right coronary artery which was treated with drug-eluting stent, 70% first diagonal followed by 80% lesion, 55% distal LAD, 50% OM1, 55% OM 2 and normal LV function with 2+ mitral regurgitation.  Last seen in the office September 2021 for preoperative evaluation prior to right carotid endarterectomy.  However in follow-up Dr. Scot Dock felt that her right carotid stenosis may not be surgically accessible and also may not be a candidate for transcarotid stenting and recommended medical therapy.  Patient has a lung mass followed by pulmonary.  She has elected not to pursue biopsy at this point.  Since last seen  Current Outpatient Medications  Medication Sig Dispense Refill  . albuterol (PROVENTIL HFA;VENTOLIN HFA) 108 (90 BASE) MCG/ACT inhaler Inhale 2 puffs into the lungs every 6 (six) hours as needed. For bronchitis and coughing    . albuterol (PROVENTIL) (2.5 MG/3ML) 0.083% nebulizer solution Take 3 mLs (2.5 mg total) by nebulization every 6 (six) hours as needed for wheezing or shortness of breath. 75 mL 12  . alendronate (FOSAMAX) 70 MG tablet Take 70 mg by mouth every 7 (seven) days.     . ALPRAZolam (XANAX) 0.5 MG tablet Take 0.5 mg by mouth 3 (three) times daily.    Marland Kitchen aspirin 81 MG chewable tablet Chew 1 tablet (81 mg total) by mouth daily. 30 tablet 0  . atorvastatin (LIPITOR) 20 MG tablet Take 1 tablet (20 mg total) by mouth at bedtime. (Patient taking differently: Take 20 mg by mouth daily. ) 30 tablet 0  . Cholecalciferol (VITAMIN D) 50 MCG (2000 UT) CAPS Take 2,000 Units by mouth daily.    . clopidogrel (PLAVIX) 75 MG tablet Take 75 mg by mouth daily.      . diphenhydrAMINE (BENADRYL) 50 MG capsule Take 50 mg by mouth 1 hour prior  to your procedure. 1 capsule 0  . donepezil (ARICEPT) 5 MG tablet Take 5 mg by mouth at bedtime.     . dronabinol (MARINOL) 5 MG capsule Take 5 mg by mouth 2 (two) times daily before lunch and supper.     . escitalopram (LEXAPRO) 10 MG tablet Take 10 mg by mouth daily.    . ferrous sulfate 325 (65 FE) MG tablet Take 1 tablet (325 mg total) by mouth daily with breakfast.  3  . furosemide (LASIX) 20 MG tablet Take 20 mg by mouth every morning.    . insulin glargine (LANTUS) 100 UNIT/ML injection Inject 0.2 mLs (20 Units total) into the skin at bedtime. (Patient taking differently: Inject 30 Units into the skin at bedtime. ) 10 mL 11  . Iron-Vitamins (GERITOL COMPLETE PO) Take 1 tablet by mouth daily.  (Patient not taking: Reported on 09/01/2020)    . labetalol (NORMODYNE) 300 MG tablet Take 1 tablet (300 mg total) by mouth 2 (two) times daily. 60 tablet 0  . latanoprost (XALATAN) 0.005 % ophthalmic solution Place 1 drop into both eyes at bedtime.     . Omega-3 Fatty Acids (FISH OIL) 1000 MG CPDR Take 1,000 mg by mouth every other day.     . predniSONE (DELTASONE) 50 MG tablet One tablet (50mg ) 13 hours prior to procedure; one tablet (50mg ) 7 hours prior to procedure and then one tablet (50 mg) one hour prior to procedure. 3 tablet 0  .  varenicline (CHANTIX CONTINUING MONTH PAK) 1 MG tablet Take 1 tablet (1 mg total) by mouth 2 (two) times daily. (Patient not taking: Reported on 09/01/2020) 60 tablet 5   No current facility-administered medications for this visit.     Past Medical History:  Diagnosis Date  . Alzheimer disease ([place])   . Anemia   . Anxiety   . Arthritis   . Asthma   . CAD (coronary artery disease)   . Cellulitis of buttock, left 2010  . Diabetes mellitus   . GERD (gastroesophageal reflux disease)   . Heart murmur   . Hyperlipidemia   . Hypertension   . MI (myocardial infarction) ([place])   . PVD (peripheral vascular disease) ([place])   . Wears glasses     Past Surgical  History:  Procedure Laterality Date  . ABDOMINAL AORTAGRAM N/A 07/28/2012   Procedure: ABDOMINAL Maxcine Ham;  Surgeon: Angelia Mould, MD;  Location: [facility] CATH LAB;  Service: Cardiovascular;  Laterality: N/A;  . ABDOMINAL AORTOGRAM W/LOWER EXTREMITY N/A 06/10/2017   Procedure: ABDOMINAL AORTOGRAM W/LOWER EXTREMITY;  Surgeon: Angelia Mould, MD;  Location: [place] CV LAB;  Service: Cardiovascular;  Laterality: N/A;  . ABDOMINAL HYSTERECTOMY    . COLONOSCOPY  10/26/2011   Procedure: COLONOSCOPY;  Surgeon: Dorothyann Peng, MD;  [facility];  Service: Endoscopy;  Laterality: N/A;  1:30  . CORONARY STENT INTERVENTION N/A 06/14/2017   Procedure: CORONARY STENT INTERVENTION;  Surgeon: Leonie Man, MD;  Location: [place] CV LAB;  Service: Cardiovascular;  Laterality: N/A;  . FEMORAL-POPLITEAL BYPASS GRAFT  07/2010   pt has had 3 fem-pop bpg  . FEMORAL-POPLITEAL BYPASS GRAFT  11/04/2012   Procedure: BYPASS GRAFT FEMORAL-POPLITEAL ARTERY;  Surgeon: Angelia Mould, MD;  Location: [facility] OR;  Service: Vascular;  Laterality: Left;  Redo Left Femoral Popliteal Bypass with PTFE  . FOOT AMPUTATION  2010   left  . LEFT HEART CATH AND CORONARY ANGIOGRAPHY N/A 06/14/2017   Procedure: LEFT HEART CATH AND CORONARY ANGIOGRAPHY;  Surgeon: Leonie Man, MD;  Location: [place] CV LAB;  Service: Cardiovascular;  Laterality: N/A;  . LOWER EXTREMITY ANGIOGRAM Bilateral 07/28/2012   Procedure: LOWER EXTREMITY ANGIOGRAM;  Surgeon: Angelia Mould, MD;  Location: [facility] CATH LAB;  Service: Cardiovascular;  Laterality: Bilateral;  . LOWER EXTREMITY ANGIOGRAM Left 01/02/2016   Procedure: Lower Extremity Angiogram;  Surgeon: Angelia Mould, MD;  Location: [place] CV LAB;  Service: Cardiovascular;  Laterality: Left;  . LOWER EXTREMITY ANGIOGRAM Left 07/03/2017   Procedure: Lower Extremity Angiogram;  Surgeon: Waynetta Sandy, MD;  Location: [place] CV LAB;   Service: Cardiovascular;  Laterality: Left;  . LOWER EXTREMITY INTERVENTION Left 07/03/2017   Procedure: Lower Extremity Intervention;  Surgeon: Waynetta Sandy, MD;  Location: [place] CV LAB;  Service: Cardiovascular;  Laterality: Left;  . ORIF ANKLE FRACTURE Left 01/27/2013   Procedure: OPEN REDUCTION INTERNAL FIXATION (ORIF) ANKLE FRACTURE;  Surgeon: Wylene Simmer, MD;  Location: [place];  Service: Orthopedics;  Laterality: Left;  . PERCUTANEOUS STENT INTERVENTION Left 07/28/2012   Procedure: PERCUTANEOUS STENT INTERVENTION;  Surgeon: Angelia Mould, MD;  Location: [facility] CATH LAB;  Service: Cardiovascular;  Laterality: Left;  lt ext tiliac stentx1  . PERIPHERAL VASCULAR BALLOON ANGIOPLASTY Left 06/10/2017   Procedure: PERIPHERAL VASCULAR BALLOON ANGIOPLASTY;  Surgeon: Angelia Mould, MD;  Location: [place] CV LAB;  Service: Cardiovascular;  Laterality: Left;  external iliac  . PERIPHERAL VASCULAR CATHETERIZATION N/A  01/02/2016   Procedure: Abdominal Aortogram;  Surgeon: Angelia Mould, MD;  Location: [place] CV LAB;  Service: Cardiovascular;  Laterality: N/A;  . PERIPHERAL VASCULAR CATHETERIZATION Left 01/02/2016   Procedure: Peripheral Vascular Intervention;  Surgeon: Angelia Mould, MD;  Location: [place] CV LAB;  Service: Cardiovascular;  Laterality: Left;  lt ext iliac  . right common iliac stent  10/2009   Dr Doren Custard ([place])  . S/P Hysterecotmy     partial  . TONSILLECTOMY    . VASCULAR SURGERY      Social History   Socioeconomic History  . Marital status: Widowed    Spouse name: Not on file  . Number of children: 3  . Years of education: Not on file  . Highest education level: Not on file  Occupational History  . Occupation: retired; Biochemist, clinical: RETIRED  Tobacco Use  . Smoking status: Current Every Day Smoker    Packs/day: 0.50    Years: 30.00    Pack years: 15.00    Types: Cigarettes     Start date: 11/04/2012  . Smokeless tobacco: Never Used  . Tobacco comment: trying to cut back, 1/2 a pack 08/11/20  Vaping Use  . Vaping Use: Not on file  Substance and Sexual Activity  . Alcohol use: No    Alcohol/week: 0.0 standard drinks  . Drug use: No  . Sexual activity: Not Currently  Other Topics Concern  . Not on file  Social History Narrative  . Not on file   Social Determinants of Health   Financial Resource Strain: Not on file  Food Insecurity: Not on file  Transportation Needs: Not on file  Physical Activity: Not on file  Stress: Not on file  Social Connections: Not on file  Intimate Partner Violence: Not on file    Family History  Problem Relation Age of Onset  . Brain cancer Father   . Cancer Father   . Diabetes Mother        many family members  . Alzheimer's disease Mother   . Heart disease Other     ROS: no fevers or chills, productive cough, hemoptysis, dysphasia, odynophagia, melena, hematochezia, dysuria, hematuria, rash, seizure activity, orthopnea, PND, pedal edema, claudication. Remaining systems are negative.  Physical Exam: Well-developed well-nourished in no acute distress.  Skin is warm and dry.  HEENT is normal.  Neck is supple.  Chest is clear to auscultation with normal expansion.  Cardiovascular exam is regular rate and rhythm.  Abdominal exam nontender or distended. No masses palpated. Extremities show no edema. neuro grossly intact  ECG- personally reviewed  A/P  1 coronary artery disease-plan to continue aspirin and statin.  2 hypertension-blood pressure controlled.  Continue present medications and follow.  3 hyperlipidemia-continue statin.  4 carotid artery disease-as outlined above conservative measures are planned.  Continue aspirin and statin.  5.  Vascular disease-followed by vascular surgery.  6 lung mass-concerning for malignancy.  Follow-up pulmonary.  Kirk Ruths, MD

## 2021-01-03 ENCOUNTER — Ambulatory Visit: Payer: Medicare HMO | Admitting: Cardiology

## 2021-01-09 DIAGNOSIS — Z6822 Body mass index (BMI) 22.0-22.9, adult: Secondary | ICD-10-CM | POA: Diagnosis not present

## 2021-01-09 DIAGNOSIS — I7025 Atherosclerosis of native arteries of other extremities with ulceration: Secondary | ICD-10-CM | POA: Diagnosis not present

## 2021-01-09 DIAGNOSIS — R2689 Other abnormalities of gait and mobility: Secondary | ICD-10-CM | POA: Diagnosis not present

## 2021-01-09 DIAGNOSIS — R55 Syncope and collapse: Secondary | ICD-10-CM | POA: Diagnosis not present

## 2021-01-11 ENCOUNTER — Ambulatory Visit: Payer: Medicare HMO | Admitting: Internal Medicine

## 2021-01-11 DIAGNOSIS — E1122 Type 2 diabetes mellitus with diabetic chronic kidney disease: Secondary | ICD-10-CM | POA: Diagnosis not present

## 2021-01-11 DIAGNOSIS — I5032 Chronic diastolic (congestive) heart failure: Secondary | ICD-10-CM | POA: Diagnosis not present

## 2021-01-11 DIAGNOSIS — N183 Chronic kidney disease, stage 3 unspecified: Secondary | ICD-10-CM | POA: Diagnosis not present

## 2021-01-11 DIAGNOSIS — I130 Hypertensive heart and chronic kidney disease with heart failure and stage 1 through stage 4 chronic kidney disease, or unspecified chronic kidney disease: Secondary | ICD-10-CM | POA: Diagnosis not present

## 2021-01-28 NOTE — Progress Notes (Signed)
HPI: Follow-up coronary artery disease.  Patient developed respiratory distress following lower extremity arteriogram in 2018.  Echocardiogram August 2018 showed normal LV function, moderate mitral regurgitation.  Cardiac catheterization revealed 80% right coronary artery, 70% ostial first diagonal followed by 80% lesion, 55% distal LAD, 50% OM1, 55% ostial OM 2, 2+ mitral regurgitation and ejection fraction greater than 65%.  Patient had PCI of the right coronary artery at that time.  Patient also with peripheral vascular disease with previous stenting of left common femoral to tibial peroneal trunk due to critical lower limb extremity ischemia.  Lower extremity Dopplers September 2021 showed a widely patent stent on the left.  ABIs September 2021 severe on the right and mild on the left.  Carotid Dopplers September 2021 showed 80 to 99% right carotid stenosis.  Patient seen by vascular surgery.  It was felt to stenosis was not surgically accessible and she was not a candidate for stenting.  Medical therapy recommended.  Note patient also has malignancy in her lung and patient does not want aggressive measures.  Since last seen she denies chest pain, palpitations or syncope.  Current Outpatient Medications  Medication Sig Dispense Refill  . albuterol (PROVENTIL HFA;VENTOLIN HFA) 108 (90 BASE) MCG/ACT inhaler Inhale 2 puffs into the lungs every 6 (six) hours as needed. For bronchitis and coughing    . albuterol (PROVENTIL) (2.5 MG/3ML) 0.083% nebulizer solution Take 3 mLs (2.5 mg total) by nebulization every 6 (six) hours as needed for wheezing or shortness of breath. 75 mL 12  . alendronate (FOSAMAX) 70 MG tablet Take 70 mg by mouth every 7 (seven) days.     . ALPRAZolam (XANAX) 0.5 MG tablet Take 0.5 mg by mouth 3 (three) times daily.    Marland Kitchen aspirin 81 MG chewable tablet Chew 1 tablet (81 mg total) by mouth daily. 30 tablet 0  . atorvastatin (LIPITOR) 20 MG tablet Take 1 tablet (20 mg total) by  mouth at bedtime. (Patient taking differently: Take 20 mg by mouth daily.) 30 tablet 0  . Cholecalciferol (VITAMIN D) 50 MCG (2000 UT) CAPS Take 2,000 Units by mouth daily.    . clopidogrel (PLAVIX) 75 MG tablet Take 75 mg by mouth daily.    . diphenhydrAMINE (BENADRYL) 50 MG capsule Take 50 mg by mouth 1 hour prior to your procedure. 1 capsule 0  . donepezil (ARICEPT) 5 MG tablet Take 5 mg by mouth at bedtime.     . dronabinol (MARINOL) 5 MG capsule Take 5 mg by mouth 2 (two) times daily before lunch and supper.     . escitalopram (LEXAPRO) 10 MG tablet Take 10 mg by mouth daily.    . ferrous sulfate 325 (65 FE) MG tablet Take 1 tablet (325 mg total) by mouth daily with breakfast.  3  . furosemide (LASIX) 20 MG tablet Take 20 mg by mouth every morning.    . insulin glargine (LANTUS) 100 UNIT/ML injection Inject 0.2 mLs (20 Units total) into the skin at bedtime. (Patient taking differently: Inject 30 Units into the skin at bedtime.) 10 mL 11  . Iron-Vitamins (GERITOL COMPLETE PO) Take 1 tablet by mouth daily.    Marland Kitchen labetalol (NORMODYNE) 300 MG tablet Take 1 tablet (300 mg total) by mouth 2 (two) times daily. 60 tablet 0  . latanoprost (XALATAN) 0.005 % ophthalmic solution Place 1 drop into both eyes at bedtime.     . Omega-3 Fatty Acids (FISH OIL) 1000 MG CPDR Take 1,000 mg  by mouth every other day.     . predniSONE (DELTASONE) 50 MG tablet One tablet (50mg ) 13 hours prior to procedure; one tablet (50mg ) 7 hours prior to procedure and then one tablet (50 mg) one hour prior to procedure. 3 tablet 0  . varenicline (CHANTIX CONTINUING MONTH PAK) 1 MG tablet Take 1 tablet (1 mg total) by mouth 2 (two) times daily. 60 tablet 5   No current facility-administered medications for this visit.     Past Medical History:  Diagnosis Date  . Alzheimer disease ([place])   . Anemia   . Anxiety   . Arthritis   . Asthma   . CAD (coronary artery disease)   . Cellulitis of buttock, left 2010  . Diabetes  mellitus   . GERD (gastroesophageal reflux disease)   . Heart murmur   . Hyperlipidemia   . Hypertension   . MI (myocardial infarction) ([place])   . PVD (peripheral vascular disease) ([place])   . Wears glasses     Past Surgical History:  Procedure Laterality Date  . ABDOMINAL AORTAGRAM N/A 07/28/2012   Procedure: ABDOMINAL Maxcine Ham;  Surgeon: Angelia Mould, MD;  Location: [facility] CATH LAB;  Service: Cardiovascular;  Laterality: N/A;  . ABDOMINAL AORTOGRAM W/LOWER EXTREMITY N/A 06/10/2017   Procedure: ABDOMINAL AORTOGRAM W/LOWER EXTREMITY;  Surgeon: Angelia Mould, MD;  Location: [place] CV LAB;  Service: Cardiovascular;  Laterality: N/A;  . ABDOMINAL HYSTERECTOMY    . COLONOSCOPY  10/26/2011   Procedure: COLONOSCOPY;  Surgeon: Dorothyann Peng, MD;  [facility];  Service: Endoscopy;  Laterality: N/A;  1:30  . CORONARY STENT INTERVENTION N/A 06/14/2017   Procedure: CORONARY STENT INTERVENTION;  Surgeon: Leonie Man, MD;  Location: [place] CV LAB;  Service: Cardiovascular;  Laterality: N/A;  . FEMORAL-POPLITEAL BYPASS GRAFT  07/2010   pt has had 3 fem-pop bpg  . FEMORAL-POPLITEAL BYPASS GRAFT  11/04/2012   Procedure: BYPASS GRAFT FEMORAL-POPLITEAL ARTERY;  Surgeon: Angelia Mould, MD;  Location[facility] OR;  Service: Vascular;  Laterality: Left;  Redo Left Femoral Popliteal Bypass with PTFE  . FOOT AMPUTATION  2010   left  . LEFT HEART CATH AND CORONARY ANGIOGRAPHY N/A 06/14/2017   Procedure: LEFT HEART CATH AND CORONARY ANGIOGRAPHY;  Surgeon: Leonie Man, MD;  Location: [place] CV LAB;  Service: Cardiovascular;  Laterality: N/A;  . LOWER EXTREMITY ANGIOGRAM Bilateral 07/28/2012   Procedure: LOWER EXTREMITY ANGIOGRAM;  Surgeon: Angelia Mould, MD;  Location: [facility] CATH LAB;  Service: Cardiovascular;  Laterality: Bilateral;  . LOWER EXTREMITY ANGIOGRAM Left 01/02/2016   Procedure: Lower Extremity Angiogram;  Surgeon: Angelia Mould, MD;   Location: [place] CV LAB;  Service: Cardiovascular;  Laterality: Left;  . LOWER EXTREMITY ANGIOGRAM Left 07/03/2017   Procedure: Lower Extremity Angiogram;  Surgeon: Waynetta Sandy, MD;  Location: [place] CV LAB;  Service: Cardiovascular;  Laterality: Left;  . LOWER EXTREMITY INTERVENTION Left 07/03/2017   Procedure: Lower Extremity Intervention;  Surgeon: Waynetta Sandy, MD;  Location: [place] CV LAB;  Service: Cardiovascular;  Laterality: Left;  . ORIF ANKLE FRACTURE Left 01/27/2013   Procedure: OPEN REDUCTION INTERNAL FIXATION (ORIF) ANKLE FRACTURE;  Surgeon: Wylene Simmer, MD;  Location: [place];  Service: Orthopedics;  Laterality: Left;  . PERCUTANEOUS STENT INTERVENTION Left 07/28/2012   Procedure: PERCUTANEOUS STENT INTERVENTION;  Surgeon: Angelia Mould, MD;  Location: [facility] CATH LAB;  Service: Cardiovascular;  Laterality: Left;  lt ext tiliac stentx1  .  PERIPHERAL VASCULAR BALLOON ANGIOPLASTY Left 06/10/2017   Procedure: PERIPHERAL VASCULAR BALLOON ANGIOPLASTY;  Surgeon: Angelia Mould, MD;  Location: [place] CV LAB;  Service: Cardiovascular;  Laterality: Left;  external iliac  . PERIPHERAL VASCULAR CATHETERIZATION N/A 01/02/2016   Procedure: Abdominal Aortogram;  Surgeon: Angelia Mould, MD;  Location: [place] CV LAB;  Service: Cardiovascular;  Laterality: N/A;  . PERIPHERAL VASCULAR CATHETERIZATION Left 01/02/2016   Procedure: Peripheral Vascular Intervention;  Surgeon: Angelia Mould, MD;  Location: [place] CV LAB;  Service: Cardiovascular;  Laterality: Left;  lt ext iliac  . right common iliac stent  10/2009   Dr Doren Custard ([place])  . S/P Hysterecotmy     partial  . TONSILLECTOMY    . VASCULAR SURGERY      Social History   Socioeconomic History  . Marital status: Widowed    Spouse name: Not on file  . Number of children: 3  . Years of education: Not on file  . Highest education level: Not on file   Occupational History  . Occupation: retired; Biochemist, clinical: RETIRED  Tobacco Use  . Smoking status: Current Every Day Smoker    Packs/day: 0.50    Years: 30.00    Pack years: 15.00    Types: Cigarettes    Start date: 11/04/2012  . Smokeless tobacco: Never Used  . Tobacco comment: trying to cut back, 1/2 a pack 08/11/20  Vaping Use  . Vaping Use: Not on file  Substance and Sexual Activity  . Alcohol use: No    Alcohol/week: 0.0 standard drinks  . Drug use: No  . Sexual activity: Not Currently  Other Topics Concern  . Not on file  Social History Narrative  . Not on file   Social Determinants of Health   Financial Resource Strain: Not on file  Food Insecurity: Not on file  Transportation Needs: Not on file  Physical Activity: Not on file  Stress: Not on file  Social Connections: Not on file  Intimate Partner Violence: Not on file    Family History  Problem Relation Age of Onset  . Brain cancer Father   . Cancer Father   . Diabetes Mother        many family members  . Alzheimer's disease Mother   . Heart disease Other     ROS: no fevers or chills, productive cough, hemoptysis, dysphasia, odynophagia, melena, hematochezia, dysuria, hematuria, rash, seizure activity, orthopnea, PND, pedal edema, claudication. Remaining systems are negative.  Physical Exam: Well-developed well-nourished in no acute distress.  Skin is warm and dry.  HEENT is normal.  Neck is supple.  Chest is clear to auscultation with normal expansion.  Cardiovascular exam is regular rate and rhythm.  Abdominal exam nontender or distended. No masses palpated. Extremities show no edema. neuro grossly intact  A/P  1 coronary artery disease-patient denies chest pain.  Continue aspirin and statin.  2 carotid artery disease/peripheral vascular disease-she is followed by vascular surgery.  We will continue medical therapy with aspirin, Plavix and statin.  3 hypertension-blood  pressure elevated; I have asked him to track this at home and we will add additional medications as needed.  Her daughter states that blood pressure medications were discontinued previously due to low blood pressure.  4 hyperlipidemia-continue statin.  5 lung mass-followed by pulmonary and primary care.  Patient does not want aggressive measures by her report.  Kirk Ruths, MD

## 2021-01-30 ENCOUNTER — Other Ambulatory Visit (HOSPITAL_COMMUNITY): Payer: Medicare HMO

## 2021-02-01 ENCOUNTER — Ambulatory Visit: Payer: Medicare HMO | Admitting: Internal Medicine

## 2021-02-02 ENCOUNTER — Other Ambulatory Visit: Payer: Self-pay

## 2021-02-02 ENCOUNTER — Encounter: Payer: Self-pay | Admitting: Cardiology

## 2021-02-02 ENCOUNTER — Ambulatory Visit: Payer: Medicare HMO | Admitting: Cardiology

## 2021-02-02 VITALS — BP 188/60 | HR 70 | Ht 61.0 in | Wt 105.2 lb

## 2021-02-02 DIAGNOSIS — I2510 Atherosclerotic heart disease of native coronary artery without angina pectoris: Secondary | ICD-10-CM | POA: Diagnosis not present

## 2021-02-02 DIAGNOSIS — I10 Essential (primary) hypertension: Secondary | ICD-10-CM

## 2021-02-02 DIAGNOSIS — I739 Peripheral vascular disease, unspecified: Secondary | ICD-10-CM | POA: Diagnosis not present

## 2021-02-02 DIAGNOSIS — E785 Hyperlipidemia, unspecified: Secondary | ICD-10-CM | POA: Diagnosis not present

## 2021-02-02 DIAGNOSIS — I6529 Occlusion and stenosis of unspecified carotid artery: Secondary | ICD-10-CM | POA: Diagnosis not present

## 2021-02-02 NOTE — Patient Instructions (Signed)

## 2021-02-09 DIAGNOSIS — Z6822 Body mass index (BMI) 22.0-22.9, adult: Secondary | ICD-10-CM | POA: Diagnosis not present

## 2021-02-09 DIAGNOSIS — R55 Syncope and collapse: Secondary | ICD-10-CM | POA: Diagnosis not present

## 2021-02-09 DIAGNOSIS — I7025 Atherosclerosis of native arteries of other extremities with ulceration: Secondary | ICD-10-CM | POA: Diagnosis not present

## 2021-02-09 DIAGNOSIS — R2689 Other abnormalities of gait and mobility: Secondary | ICD-10-CM | POA: Diagnosis not present

## 2021-02-11 DIAGNOSIS — E1122 Type 2 diabetes mellitus with diabetic chronic kidney disease: Secondary | ICD-10-CM | POA: Diagnosis not present

## 2021-02-11 DIAGNOSIS — N183 Chronic kidney disease, stage 3 unspecified: Secondary | ICD-10-CM | POA: Diagnosis not present

## 2021-02-11 DIAGNOSIS — I5032 Chronic diastolic (congestive) heart failure: Secondary | ICD-10-CM | POA: Diagnosis not present

## 2021-02-11 DIAGNOSIS — I130 Hypertensive heart and chronic kidney disease with heart failure and stage 1 through stage 4 chronic kidney disease, or unspecified chronic kidney disease: Secondary | ICD-10-CM | POA: Diagnosis not present

## 2021-02-15 ENCOUNTER — Ambulatory Visit: Payer: Medicare HMO | Admitting: Vascular Surgery

## 2021-03-10 ENCOUNTER — Other Ambulatory Visit: Payer: Self-pay

## 2021-03-10 ENCOUNTER — Other Ambulatory Visit (HOSPITAL_COMMUNITY)
Admission: RE | Admit: 2021-03-10 | Discharge: 2021-03-10 | Disposition: A | Discharge status: Home / Self Care | Payer: Medicare HMO | Source: Ambulatory Visit | Attending: Internal Medicine | Admitting: Internal Medicine

## 2021-03-10 DIAGNOSIS — Z01812 Encounter for preprocedural laboratory examination: Secondary | ICD-10-CM | POA: Diagnosis not present

## 2021-03-10 DIAGNOSIS — Z20822 Contact with and (suspected) exposure to covid-19: Secondary | ICD-10-CM | POA: Insufficient documentation

## 2021-03-11 DIAGNOSIS — Z6822 Body mass index (BMI) 22.0-22.9, adult: Secondary | ICD-10-CM | POA: Diagnosis not present

## 2021-03-11 DIAGNOSIS — R55 Syncope and collapse: Secondary | ICD-10-CM | POA: Diagnosis not present

## 2021-03-11 DIAGNOSIS — R2689 Other abnormalities of gait and mobility: Secondary | ICD-10-CM | POA: Diagnosis not present

## 2021-03-11 DIAGNOSIS — I7025 Atherosclerosis of native arteries of other extremities with ulceration: Secondary | ICD-10-CM | POA: Diagnosis not present

## 2021-03-11 LAB — SARS CORONAVIRUS 2 (TAT 6-24 HRS): SARS Coronavirus 2: NEGATIVE

## 2021-03-14 ENCOUNTER — Ambulatory Visit (INDEPENDENT_AMBULATORY_CARE_PROVIDER_SITE_OTHER): Payer: Medicare HMO | Admitting: Internal Medicine

## 2021-03-14 ENCOUNTER — Other Ambulatory Visit: Payer: Self-pay

## 2021-03-14 ENCOUNTER — Encounter: Payer: Self-pay | Admitting: Internal Medicine

## 2021-03-14 VITALS — BP 140/58 | HR 72 | Temp 98.2°F | Ht 63.0 in | Wt 97.0 lb

## 2021-03-14 DIAGNOSIS — Z7189 Other specified counseling: Secondary | ICD-10-CM | POA: Diagnosis not present

## 2021-03-14 DIAGNOSIS — C3412 Malignant neoplasm of upper lobe, left bronchus or lung: Secondary | ICD-10-CM

## 2021-03-14 DIAGNOSIS — Z01818 Encounter for other preprocedural examination: Secondary | ICD-10-CM | POA: Diagnosis not present

## 2021-03-14 DIAGNOSIS — N183 Chronic kidney disease, stage 3 unspecified: Secondary | ICD-10-CM | POA: Diagnosis not present

## 2021-03-14 DIAGNOSIS — I130 Hypertensive heart and chronic kidney disease with heart failure and stage 1 through stage 4 chronic kidney disease, or unspecified chronic kidney disease: Secondary | ICD-10-CM | POA: Diagnosis not present

## 2021-03-14 DIAGNOSIS — J449 Chronic obstructive pulmonary disease, unspecified: Secondary | ICD-10-CM

## 2021-03-14 DIAGNOSIS — E1122 Type 2 diabetes mellitus with diabetic chronic kidney disease: Secondary | ICD-10-CM | POA: Diagnosis not present

## 2021-03-14 DIAGNOSIS — I5032 Chronic diastolic (congestive) heart failure: Secondary | ICD-10-CM | POA: Diagnosis not present

## 2021-03-14 LAB — PULMONARY FUNCTION TEST
FEF 25-75 Post: 0.37 L/sec
FEF 25-75 Pre: 0.71 L/sec
FEF2575-%Change-Post: -47 %
FEF2575-%Pred-Post: 25 %
FEF2575-%Pred-Pre: 49 %
FEV1-%Change-Post: -15 %
FEV1-%Pred-Post: 56 %
FEV1-%Pred-Pre: 66 %
FEV1-Post: 0.96 L
FEV1-Pre: 1.12 L
FEV1FVC-%Change-Post: -2 %
FEV1FVC-%Pred-Pre: 80 %
FEV6-%Change-Post: -7 %
FEV6-%Pred-Post: 79 %
FEV6-%Pred-Pre: 85 %
FEV6-Post: 1.56 L
FEV6-Pre: 1.68 L
FEV6FVC-%Change-Post: 1 %
FEV6FVC-%Pred-Post: 111 %
FEV6FVC-%Pred-Pre: 110 %
FVC-%Change-Post: -12 %
FVC-%Pred-Post: 71 %
FVC-%Pred-Pre: 81 %
FVC-Post: 1.56 L
FVC-Pre: 1.78 L
Post FEV1/FVC ratio: 61 %
Post FEV6/FVC ratio: 100 %
Pre FEV1/FVC ratio: 63 %
Pre FEV6/FVC Ratio: 98 %

## 2021-03-14 NOTE — Progress Notes (Signed)
Spirometery pre and post were completed today. DLCO and Pleth were attempted but pt was unable to preform correct technique required for the test.

## 2021-03-14 NOTE — Patient Instructions (Signed)
The patient should have follow up scheduled with myself in 6 months.   I have sent a nebulizer machine to your home through an equipment company- the nebulizer solution is at your pharmacy.

## 2021-03-14 NOTE — Progress Notes (Signed)
Sherri Brooks    [id number]    Jan 17, 1946  Primary Care Physician:Fusco, Purcell Nails, MD Date of Appointment: 03/14/2021 Established Patient Visit  Chief complaint:   Chief Complaint  Patient presents with  . Follow-up    Dry cough started 1 week ago.      HPI: Sherri Brooks is a 75 y.o. woman with tobacco use disorder, dementia, and LUL lung mass concerning for lung cancer.   Interval Updates: Here with her daughter for follow up today. Appetite has been low. No hemoptysis.  Had a repeat CT scan in [place] a couple months ago - shows emphysema and a 3.6cm cavitary lung lesion in the LUL. This is larger than the 2.6cm mass from August 2021. We reviewed again her desire to not pursue biopsy or treatment.   She smokes 1-2 cigarettes a day still.   She is a retired Marine scientist.   She also has carotid artery stenosis which is being medically managed as well as CAD.   I have reviewed the patient's family social and past medical history and updated as appropriate.   Past Medical History:  Diagnosis Date  . Alzheimer disease ([place])   . Anemia   . Anxiety   . Arthritis   . Asthma   . CAD (coronary artery disease)   . Cellulitis of buttock, left 2010  . Diabetes mellitus   . GERD (gastroesophageal reflux disease)   . Heart murmur   . Hyperlipidemia   . Hypertension   . MI (myocardial infarction) ([place])   . PVD (peripheral vascular disease) ([place])   . Wears glasses     Past Surgical History:  Procedure Laterality Date  . ABDOMINAL AORTAGRAM N/A 07/28/2012   Procedure: ABDOMINAL Maxcine Ham;  Surgeon: Angelia Mould, MD;  Location: [facility] CATH LAB;  Service: Cardiovascular;  Laterality: N/A;  . ABDOMINAL AORTOGRAM W/LOWER EXTREMITY N/A 06/10/2017   Procedure: ABDOMINAL AORTOGRAM W/LOWER EXTREMITY;  Surgeon: Angelia Mould, MD;  Location: [place] CV LAB;  Service: Cardiovascular;  Laterality: N/A;  . ABDOMINAL HYSTERECTOMY    . COLONOSCOPY  10/26/2011    Procedure: COLONOSCOPY;  Surgeon: Dorothyann Peng, MD;  [facility];  Service: Endoscopy;  Laterality: N/A;  1:30  . CORONARY STENT INTERVENTION N/A 06/14/2017   Procedure: CORONARY STENT INTERVENTION;  Surgeon: Leonie Man, MD;  Location: [place] CV LAB;  Service: Cardiovascular;  Laterality: N/A;  . FEMORAL-POPLITEAL BYPASS GRAFT  07/2010   pt has had 3 fem-pop bpg  . FEMORAL-POPLITEAL BYPASS GRAFT  11/04/2012   Procedure: BYPASS GRAFT FEMORAL-POPLITEAL ARTERY;  Surgeon: Angelia Mould, MD;  Location: [facility] OR;  Service: Vascular;  Laterality: Left;  Redo Left Femoral Popliteal Bypass with PTFE  . FOOT AMPUTATION  2010   left  . LEFT HEART CATH AND CORONARY ANGIOGRAPHY N/A 06/14/2017   Procedure: LEFT HEART CATH AND CORONARY ANGIOGRAPHY;  Surgeon: Leonie Man, MD;  Location: [place] CV LAB;  Service: Cardiovascular;  Laterality: N/A;  . LOWER EXTREMITY ANGIOGRAM Bilateral 07/28/2012   Procedure: LOWER EXTREMITY ANGIOGRAM;  Surgeon: Angelia Mould, MD;  Location[facility] CATH LAB;  Service: Cardiovascular;  Laterality: Bilateral;  . LOWER EXTREMITY ANGIOGRAM Left 01/02/2016   Procedure: Lower Extremity Angiogram;  Surgeon: Angelia Mould, MD;  Location: [place] CV LAB;  Service: Cardiovascular;  Laterality: Left;  . LOWER EXTREMITY ANGIOGRAM Left 07/03/2017   Procedure: Lower Extremity Angiogram;  Surgeon: Waynetta Sandy, MD;  Location:  [employer] INVASIVE CV LAB;  Service: Cardiovascular;  Laterality: Left;  . LOWER EXTREMITY INTERVENTION Left 07/03/2017   Procedure: Lower Extremity Intervention;  Surgeon: Waynetta Sandy, MD;  Location: [place] CV LAB;  Service: Cardiovascular;  Laterality: Left;  . ORIF ANKLE FRACTURE Left 01/27/2013   Procedure: OPEN REDUCTION INTERNAL FIXATION (ORIF) ANKLE FRACTURE;  Surgeon: Wylene Simmer, MD;  Location: [place];  Service: Orthopedics;  Laterality: Left;  . PERCUTANEOUS STENT  INTERVENTION Left 07/28/2012   Procedure: PERCUTANEOUS STENT INTERVENTION;  Surgeon: Angelia Mould, MD;  Location[facility] CATH LAB;  Service: Cardiovascular;  Laterality: Left;  lt ext tiliac stentx1  . PERIPHERAL VASCULAR BALLOON ANGIOPLASTY Left 06/10/2017   Procedure: PERIPHERAL VASCULAR BALLOON ANGIOPLASTY;  Surgeon: Angelia Mould, MD;  Location: [place] CV LAB;  Service: Cardiovascular;  Laterality: Left;  external iliac  . PERIPHERAL VASCULAR CATHETERIZATION N/A 01/02/2016   Procedure: Abdominal Aortogram;  Surgeon: Angelia Mould, MD;  Location: [place] CV LAB;  Service: Cardiovascular;  Laterality: N/A;  . PERIPHERAL VASCULAR CATHETERIZATION Left 01/02/2016   Procedure: Peripheral Vascular Intervention;  Surgeon: Angelia Mould, MD;  Location: [place] CV LAB;  Service: Cardiovascular;  Laterality: Left;  lt ext iliac  . right common iliac stent  10/2009   Dr Doren Custard ([place])  . S/P Hysterecotmy     partial  . TONSILLECTOMY    . VASCULAR SURGERY      Family History  Problem Relation Age of Onset  . Brain cancer Father   . Cancer Father   . Diabetes Mother        many family members  . Alzheimer's disease Mother   . Heart disease Other     Social History   Occupational History  . Occupation: retired; Biochemist, clinical: RETIRED  Tobacco Use  . Smoking status: Current Every Day Smoker    Packs/day: 0.50    Years: 30.00    Pack years: 15.00    Types: Cigarettes    Start date: 11/04/2012  . Smokeless tobacco: Never Used  . Tobacco comment: 1-2 cigarettes a day  Vaping Use  . Vaping Use: Not on file  Substance and Sexual Activity  . Alcohol use: No    Alcohol/week: 0.0 standard drinks  . Drug use: No  . Sexual activity: Not Currently     Physical Exam: Blood pressure (!) 140/58, pulse 72, temperature 98.2 F (36.8 C), height 5\' 3"  (1.6 m), weight 97 lb (44 kg), SpO2 97 %.  Gen:      No acute distress, thin, chronically  ill, hoarse voice, in wheelchair. Lungs:   Diminished, no wheezes or crackles, no increased work of breathing CV:         Regular rate and rhythm; no murmurs, rubs, or gallops.  No pedal edema   Data Reviewed: Imaging: I have personally reviewed the imaging from [facility] which shows an enlarging LUL mass  PFTs:  PFT Results Latest Ref Rng & Units 03/14/2021  FVC-Pre L 1.78  FVC-Predicted Pre % 81  FVC-Post L 1.56  FVC-Predicted Post % 71  Pre FEV1/FVC % % 63  Post FEV1/FCV % % 61  FEV1-Pre L 1.12  FEV1-Predicted Pre % 66  FEV1-Post L 0.96   I have personally reviewed the patient's PFTs and they show moderately severe airflow limitation without a BD response.   Labs:  Immunization status: Immunization History  Administered Date(s) Administered  . Influenza, High Dose Seasonal PF  06/28/2019  . PFIZER(Purple Top)SARS-COV-2 Vaccination 07/11/2020, 08/03/2020  . Tdap 01/16/2019    Assessment:  COPD, stable Tobacco use disorder LUL lung mass - consistent with most likely SCC Goals of Care   Plan/Recommendations:  Will start albuterol treatments as needed with a home nebulizer for COPD. Counseled on tobacco cessation.  She denies wanting any biopsy or treatment of the mass. We discussed goals of care in the event her breathing worsening - she is very clear that she wants to be DNR. Her daughter is concerned that her dementia is affecting her decision making. However I think she is very clear that she does not want to suffer and wants to go to God if He calls her. She stated repeatedly that she does not want to suffer and her comfort is most important to her.  This is consistent with her desire not to undergo a biopsy or treatment. I am referring her to palliative care to discuss further, and provide support for her daughter. She probably should complete a MOST form.   I spent 30 minutes on 03/14/2021 in care of this patient including face to face time and non-face to face  time spent charting, review of outside records, and coordination of care.  Return to Care: Return in about 6 months (around 09/13/2021).   Lenice Llamas, MD Pulmonary and [place]

## 2021-03-15 ENCOUNTER — Encounter (HOSPITAL_COMMUNITY): Payer: Self-pay

## 2021-03-15 ENCOUNTER — Emergency Department (HOSPITAL_COMMUNITY)
Admission: EM | Admit: 2021-03-15 | Discharge: 2021-03-15 | Disposition: A | Discharge status: Home / Self Care | Payer: Medicare HMO | Attending: Emergency Medicine | Admitting: Emergency Medicine

## 2021-03-15 ENCOUNTER — Other Ambulatory Visit: Payer: Self-pay

## 2021-03-15 ENCOUNTER — Emergency Department (HOSPITAL_COMMUNITY): Payer: Medicare HMO

## 2021-03-15 DIAGNOSIS — D649 Anemia, unspecified: Secondary | ICD-10-CM | POA: Diagnosis not present

## 2021-03-15 DIAGNOSIS — I6782 Cerebral ischemia: Secondary | ICD-10-CM | POA: Diagnosis not present

## 2021-03-15 DIAGNOSIS — S0990XA Unspecified injury of head, initial encounter: Secondary | ICD-10-CM | POA: Diagnosis not present

## 2021-03-15 DIAGNOSIS — F0390 Unspecified dementia without behavioral disturbance: Secondary | ICD-10-CM | POA: Diagnosis not present

## 2021-03-15 DIAGNOSIS — E1122 Type 2 diabetes mellitus with diabetic chronic kidney disease: Secondary | ICD-10-CM | POA: Insufficient documentation

## 2021-03-15 DIAGNOSIS — B0239 Other herpes zoster eye disease: Secondary | ICD-10-CM | POA: Diagnosis not present

## 2021-03-15 DIAGNOSIS — J45909 Unspecified asthma, uncomplicated: Secondary | ICD-10-CM | POA: Diagnosis not present

## 2021-03-15 DIAGNOSIS — Z79899 Other long term (current) drug therapy: Secondary | ICD-10-CM | POA: Diagnosis not present

## 2021-03-15 DIAGNOSIS — I129 Hypertensive chronic kidney disease with stage 1 through stage 4 chronic kidney disease, or unspecified chronic kidney disease: Secondary | ICD-10-CM | POA: Insufficient documentation

## 2021-03-15 DIAGNOSIS — Z7902 Long term (current) use of antithrombotics/antiplatelets: Secondary | ICD-10-CM | POA: Diagnosis not present

## 2021-03-15 DIAGNOSIS — W01198A Fall on same level from slipping, tripping and stumbling with subsequent striking against other object, initial encounter: Secondary | ICD-10-CM | POA: Insufficient documentation

## 2021-03-15 DIAGNOSIS — E786 Lipoprotein deficiency: Secondary | ICD-10-CM | POA: Diagnosis not present

## 2021-03-15 DIAGNOSIS — J449 Chronic obstructive pulmonary disease, unspecified: Secondary | ICD-10-CM | POA: Insufficient documentation

## 2021-03-15 DIAGNOSIS — I2510 Atherosclerotic heart disease of native coronary artery without angina pectoris: Secondary | ICD-10-CM | POA: Insufficient documentation

## 2021-03-15 DIAGNOSIS — F17210 Nicotine dependence, cigarettes, uncomplicated: Secondary | ICD-10-CM | POA: Insufficient documentation

## 2021-03-15 DIAGNOSIS — N183 Chronic kidney disease, stage 3 unspecified: Secondary | ICD-10-CM | POA: Insufficient documentation

## 2021-03-15 DIAGNOSIS — Z7982 Long term (current) use of aspirin: Secondary | ICD-10-CM | POA: Diagnosis not present

## 2021-03-15 DIAGNOSIS — S199XXA Unspecified injury of neck, initial encounter: Secondary | ICD-10-CM | POA: Diagnosis not present

## 2021-03-15 DIAGNOSIS — R220 Localized swelling, mass and lump, head: Secondary | ICD-10-CM | POA: Diagnosis not present

## 2021-03-15 DIAGNOSIS — E876 Hypokalemia: Secondary | ICD-10-CM | POA: Diagnosis not present

## 2021-03-15 DIAGNOSIS — B0230 Zoster ocular disease, unspecified: Secondary | ICD-10-CM

## 2021-03-15 DIAGNOSIS — W19XXXA Unspecified fall, initial encounter: Secondary | ICD-10-CM

## 2021-03-15 DIAGNOSIS — S0993XA Unspecified injury of face, initial encounter: Secondary | ICD-10-CM | POA: Diagnosis not present

## 2021-03-15 DIAGNOSIS — I10 Essential (primary) hypertension: Secondary | ICD-10-CM | POA: Diagnosis not present

## 2021-03-15 LAB — CBC WITH DIFFERENTIAL/PLATELET
Abs Immature Granulocytes: 0.02 10*3/uL (ref 0.00–0.07)
Basophils Absolute: 0 10*3/uL (ref 0.0–0.1)
Basophils Relative: 1 %
Eosinophils Absolute: 0 10*3/uL (ref 0.0–0.5)
Eosinophils Relative: 1 %
HCT: 25 % — ABNORMAL LOW (ref 36.0–46.0)
Hemoglobin: 7.9 g/dL — ABNORMAL LOW (ref 12.0–15.0)
Immature Granulocytes: 0 %
Lymphocytes Relative: 13 %
Lymphs Abs: 0.7 10*3/uL (ref 0.7–4.0)
MCH: 25.6 pg — ABNORMAL LOW (ref 26.0–34.0)
MCHC: 31.6 g/dL (ref 30.0–36.0)
MCV: 80.9 fL (ref 80.0–100.0)
Monocytes Absolute: 0.5 10*3/uL (ref 0.1–1.0)
Monocytes Relative: 9 %
Neutro Abs: 4.1 10*3/uL (ref 1.7–7.7)
Neutrophils Relative %: 76 %
Platelets: 330 10*3/uL (ref 150–400)
RBC: 3.09 MIL/uL — ABNORMAL LOW (ref 3.87–5.11)
RDW: 15.8 % — ABNORMAL HIGH (ref 11.5–15.5)
WBC: 5.3 10*3/uL (ref 4.0–10.5)
nRBC: 0 % (ref 0.0–0.2)

## 2021-03-15 LAB — COMPREHENSIVE METABOLIC PANEL
ALT: 11 U/L (ref 0–44)
AST: 24 U/L (ref 15–41)
Albumin: 2.4 g/dL — ABNORMAL LOW (ref 3.5–5.0)
Alkaline Phosphatase: 72 U/L (ref 38–126)
Anion gap: 11 (ref 5–15)
BUN: 15 mg/dL (ref 8–23)
CO2: 28 mmol/L (ref 22–32)
Calcium: 8.4 mg/dL — ABNORMAL LOW (ref 8.9–10.3)
Chloride: 94 mmol/L — ABNORMAL LOW (ref 98–111)
Creatinine, Ser: 1.58 mg/dL — ABNORMAL HIGH (ref 0.44–1.00)
GFR, Estimated: 34 mL/min — ABNORMAL LOW (ref >60)
Glucose, Bld: 195 mg/dL — ABNORMAL HIGH (ref 70–99)
Potassium: 2.9 mmol/L — ABNORMAL LOW (ref 3.5–5.1)
Sodium: 133 mmol/L — ABNORMAL LOW (ref 135–145)
Total Bilirubin: 0.9 mg/dL (ref 0.3–1.2)
Total Protein: 7.5 g/dL (ref 6.5–8.1)

## 2021-03-15 MED ORDER — POTASSIUM CHLORIDE CRYS ER 20 MEQ PO TBCR: 20.0000 meq | EXTENDED_RELEASE_TABLET | Freq: Two times a day (BID) | ORAL | 0 refills | Status: DC

## 2021-03-15 MED ORDER — TETRACAINE HCL 0.5 % OP SOLN: 2.0000 [drp] | Freq: Once | OPHTHALMIC | Status: DC

## 2021-03-15 MED ORDER — TETRACAINE HCL 0.5 % OP SOLN
2.0000 [drp] | Freq: Once | OPHTHALMIC | Status: AC
  Administered 2021-03-15: 2 [drp] via OPHTHALMIC
  Filled 2021-03-15: qty 4

## 2021-03-15 MED ORDER — FLUORESCEIN SODIUM 1 MG OP STRP
1.0000 | ORAL_STRIP | Freq: Once | OPHTHALMIC | Status: AC
  Administered 2021-03-15: 1 via OPHTHALMIC
  Filled 2021-03-15: qty 1

## 2021-03-15 MED ORDER — FLUORESCEIN SODIUM 1 MG OP STRP: 1.0000 | ORAL_STRIP | Freq: Once | OPHTHALMIC | Status: DC

## 2021-03-15 MED ORDER — POTASSIUM CHLORIDE CRYS ER 20 MEQ PO TBCR
40.0000 meq | EXTENDED_RELEASE_TABLET | Freq: Once | ORAL | Status: AC
  Administered 2021-03-15: 40 meq via ORAL
  Filled 2021-03-15: qty 2

## 2021-03-15 MED ORDER — VALACYCLOVIR HCL 1 G PO TABS: 1000.0000 mg | ORAL_TABLET | Freq: Three times a day (TID) | ORAL | 0 refills | Status: DC

## 2021-03-15 NOTE — ED Provider Notes (Signed)
Emergency Medicine Provider Triage Evaluation Note  Sherri Brooks , a 75 y.o. female  was evaluated in triage.  Pt complains of a fall and eye discomfort.  Daughter is at bedside and assists with history as patient is demented.  She states that the patient fell a few days ago and this fall was unwitnessed.  She is unsure of any head trauma but states that it is possible that she did hit her head she fell onto her right side.  Patient has had no complaints of pain until yesterday when she started having some swelling and drainage to her eye.  She also has some blistering noted to the forehead.  She has not had any recent fevers or vomiting but she has had some diarrhea and increased fatigue over the last few days.  She had a negative COVID test a few days ago with her PCP.Marland Kitchen  Review of Systems  Positive: Fatigue, fall, eye swelling/drainage, diarrhea Negative: vomiting  Physical Exam  BP (!) 123/48 (BP Location: Right Arm)   Pulse 65   Temp 98.8 F (37.1 C) (Oral)   Resp 18   Ht 5\' 3"  (1.6 m)   Wt 45.4 kg   SpO2 98%   BMI 17.71 kg/m  Gen:   Awake, no distress   Resp:  Normal effort  MSK:   Moves extremities without difficulty  Other:  Edema noted to the right eye with some blistering to forehead, mild cspine ttp  Medical Decision Making  Medically screening exam initiated at 12:21 PM.  Appropriate orders placed.  Sherri Brooks was informed that the remainder of the evaluation will be completed by another provider, this initial triage assessment does not replace that evaluation, and the importance of remaining in the ED until their evaluation is complete.     Bishop Dublin 03/15/21 1224    Fredia Sorrow, MD 03/15/21 [phone]

## 2021-03-15 NOTE — ED Provider Notes (Signed)
[facility] EMERGENCY DEPARTMENT Provider Note   CSN: [id number] Arrival date & time: 03/15/21  1058     History Chief Complaint  Patient presents with  . Fall    Sherri Brooks is a 75 y.o. female.  Patient had a fall yesterday after being seen by the pulmonologist.  Patient has a lung mass that she is decided not to evaluate further.  Initially following the fall patient did not have any swelling to her face or around the eye.  But then later in the evening daughter noted swelling to the upper eyelid.  Some goopy discharge and some vesicles on the forehead and bridge of nose.  Patient had not been complaining of any face pain or eye pain.  I did report that she did hit her head.  Patient takes Plavix.  Patient has a history of dementia.,  Diabetes, hypertension, hyperlipidemia, coronary artery disease, Alzheimer's disease, anemia.  His primary care doctor is Dr. Riley Kill.  Patient also has history of peripheral vascular disease had a hysterectomy has had a femoral popliteal bypass graft in 2011.  Foot amputation of the left in 2010.  Daughter also states that she has been having some diarrhea for a few days.        Past Medical History:  Diagnosis Date  . Alzheimer disease ([place])   . Anemia   . Anxiety   . Arthritis   . Asthma   . CAD (coronary artery disease)   . Cellulitis of buttock, left 2010  . Diabetes mellitus   . GERD (gastroesophageal reflux disease)   . Heart murmur   . Hyperlipidemia   . Hypertension   . MI (myocardial infarction) ([place])   . PVD (peripheral vascular disease) ([place])   . Wears glasses     Patient Active Problem List   Diagnosis Date Noted  . COPD (chronic obstructive pulmonary disease) ([place]) 08/11/2020  . Nodule of parotid gland 04/27/2020  . CKD (chronic kidney disease) stage 3, GFR 30-59 ml/min (HCC) 01/22/2020  . Memory loss   . Acute metabolic encephalopathy 35/36/1443  . Cavitating mass in left upper lung lobe 01/21/2020  . Acute kidney  injury ([place]) 06/10/2017  . Dyspnea 06/10/2017  . Essential hypertension 06/10/2017  . PVD (peripheral vascular disease) ([place])   . Ankle fracture, bimalleolar, closed 12/29/2012  . Anemia of unknown etiology 11/06/2012  . Atherosclerotic PVD with ulceration ([place]) 11/04/2012  . Diabetes mellitus with complication ([place]) 15/40/0867  . Tobacco use disorder 11/04/2012  . Open wound of left foot 11/04/2012  . Atherosclerosis of native arteries of the extremities with ulceration ([place]) 07/09/2012  . Weight loss, unintentional 09/18/2011  . Anorexia 09/18/2011  . Early satiety 09/18/2011  . 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/07/2011    Past Surgical History:  Procedure Laterality Date  . ABDOMINAL AORTAGRAM N/A 07/28/2012   Procedure: ABDOMINAL Maxcine Ham;  Surgeon: Angelia Mould, MD;  Location: [facility] CATH LAB;  Service: Cardiovascular;  Laterality: N/A;  . ABDOMINAL AORTOGRAM W/LOWER EXTREMITY N/A 06/10/2017   Procedure: ABDOMINAL AORTOGRAM W/LOWER EXTREMITY;  Surgeon: Angelia Mould, MD;  Location: [place] CV LAB;  Service: Cardiovascular;  Laterality: N/A;  . ABDOMINAL HYSTERECTOMY    . COLONOSCOPY  10/26/2011   Procedure: COLONOSCOPY;  Surgeon: Dorothyann Peng, MD;  Location: [facility] ENDO SUITE;  Service: Endoscopy;  Laterality: N/A;  1:30  . CORONARY STENT INTERVENTION N/A 06/14/2017   Procedure: CORONARY STENT INTERVENTION;  Surgeon: Leonie Man, MD;  Location: [place] CV LAB;  Service: Cardiovascular;  Laterality: N/A;  . FEMORAL-POPLITEAL BYPASS GRAFT  07/2010   pt has had 3 fem-pop bpg  . FEMORAL-POPLITEAL BYPASS GRAFT  11/04/2012   Procedure: BYPASS GRAFT FEMORAL-POPLITEAL ARTERY;  Surgeon: Angelia Mould, MD;  Location: [facility] OR;  Service: Vascular;  Laterality: Left;  Redo Left Femoral Popliteal Bypass with PTFE  . FOOT AMPUTATION  2010   left  . LEFT HEART CATH AND CORONARY ANGIOGRAPHY N/A 06/14/2017   Procedure: LEFT HEART CATH AND CORONARY ANGIOGRAPHY;  Surgeon:  Leonie Man, MD;  Location: [place] CV LAB;  Service: Cardiovascular;  Laterality: N/A;  . LOWER EXTREMITY ANGIOGRAM Bilateral 07/28/2012   Procedure: LOWER EXTREMITY ANGIOGRAM;  Surgeon: Angelia Mould, MD;  Location: [facility] CATH LAB;  Service: Cardiovascular;  Laterality: Bilateral;  . LOWER EXTREMITY ANGIOGRAM Left 01/02/2016   Procedure: Lower Extremity Angiogram;  Surgeon: Angelia Mould, MD;  Location: [place] CV LAB;  Service: Cardiovascular;  Laterality: Left;  . LOWER EXTREMITY ANGIOGRAM Left 07/03/2017   Procedure: Lower Extremity Angiogram;  Surgeon: Waynetta Sandy, MD;  Location: [place] CV LAB;  Service: Cardiovascular;  Laterality: Left;  . LOWER EXTREMITY INTERVENTION Left 07/03/2017   Procedure: Lower Extremity Intervention;  Surgeon: Waynetta Sandy, MD;  Location: [place] CV LAB;  Service: Cardiovascular;  Laterality: Left;  . ORIF ANKLE FRACTURE Left 01/27/2013   Procedure: OPEN REDUCTION INTERNAL FIXATION (ORIF) ANKLE FRACTURE;  Surgeon: Wylene Simmer, MD;  Location: [place];  Service: Orthopedics;  Laterality: Left;  . PERCUTANEOUS STENT INTERVENTION Left 07/28/2012   Procedure: PERCUTANEOUS STENT INTERVENTION;  Surgeon: Angelia Mould, MD;  Location: [facility] CATH LAB;  Service: Cardiovascular;  Laterality: Left;  lt ext tiliac stentx1  . PERIPHERAL VASCULAR BALLOON ANGIOPLASTY Left 06/10/2017   Procedure: PERIPHERAL VASCULAR BALLOON ANGIOPLASTY;  Surgeon: Angelia Mould, MD;  Location: [place] CV LAB;  Service: Cardiovascular;  Laterality: Left;  external iliac  . PERIPHERAL VASCULAR CATHETERIZATION N/A 01/02/2016   Procedure: Abdominal Aortogram;  Surgeon: Angelia Mould, MD;  Location: [place] CV LAB;  Service: Cardiovascular;  Laterality: N/A;  . PERIPHERAL VASCULAR CATHETERIZATION Left 01/02/2016   Procedure: Peripheral Vascular Intervention;  Surgeon: Angelia Mould, MD;   Location: [place] CV LAB;  Service: Cardiovascular;  Laterality: Left;  lt ext iliac  . right common iliac stent  10/2009   Dr Doren Custard ([place])  . S/P Hysterecotmy     partial  . TONSILLECTOMY    . VASCULAR SURGERY       OB History   No obstetric history on file.     Family History  Problem Relation Age of Onset  . Brain cancer Father   . Cancer Father   . Diabetes Mother        many family members  . Alzheimer's disease Mother   . Heart disease Other     Social History   Tobacco Use  . Smoking status: Current Every Day Smoker    Packs/day: 0.50    Years: 30.00    Pack years: 15.00    Types: Cigarettes    Start date: 11/04/2012  . Smokeless tobacco: Never Used  . Tobacco comment: 1-2 cigarettes a day  Substance Use Topics  . Alcohol use: No    Alcohol/week: 0.0 standard drinks  . Drug use: No    Home Medications Prior to Admission medications   Medication Sig Start Date End Date Taking? Authorizing Provider  albuterol (PROVENTIL HFA;VENTOLIN HFA) 108 (90  BASE) MCG/ACT inhaler Inhale 2 puffs into the lungs every 6 (six) hours as needed. For bronchitis and coughing   Yes [provider]  alendronate (FOSAMAX) 70 MG tablet Take 70 mg by mouth every 7 (seven) days.  08/30/11  Yes [provider]  ALPRAZolam Duanne Moron) 0.5 MG tablet Take 0.5 mg by mouth 3 (three) times daily. 01/01/20  Yes [provider]  aspirin 81 MG chewable tablet Chew 1 tablet (81 mg total) by mouth daily. 06/16/17  Yes Velvet Bathe, MD  atorvastatin (LIPITOR) 20 MG tablet Take 1 tablet (20 mg total) by mouth at bedtime. Patient taking differently: Take 20 mg by mouth daily. 06/15/17  Yes Velvet Bathe, MD  Cholecalciferol (VITAMIN D) 50 MCG (2000 UT) CAPS Take 2,000 Units by mouth daily.   Yes [provider]  clopidogrel (PLAVIX) 75 MG tablet Take 75 mg by mouth daily.   Yes [provider]  donepezil (ARICEPT) 5 MG tablet Take 5 mg by mouth at bedtime.   04/01/20  Yes [provider]  dronabinol (MARINOL) 5 MG capsule Take 5 mg by mouth 2 (two) times daily before lunch and supper.  04/02/20  Yes [provider]  escitalopram (LEXAPRO) 10 MG tablet Take 10 mg by mouth daily. 06/28/20  Yes [provider]  furosemide (LASIX) 20 MG tablet Take 20 mg by mouth every morning. 01/01/20  Yes [provider]  insulin glargine (LANTUS) 100 UNIT/ML injection Inject 0.2 mLs (20 Units total) into the skin at bedtime. Patient taking differently: Inject 30 Units into the skin daily as needed (blood sugar). 06/15/17  Yes Velvet Bathe, MD  labetalol (NORMODYNE) 300 MG tablet Take 1 tablet (300 mg total) by mouth 2 (two) times daily. 06/15/17  Yes Velvet Bathe, MD  potassium chloride SA (KLOR-CON) 20 MEQ tablet Take 1 tablet (20 mEq total) by mouth 2 (two) times daily. 03/15/21  Yes Fredia Sorrow, MD  valACYclovir (VALTREX) 1000 MG tablet Take 1 tablet (1,000 mg total) by mouth 3 (three) times daily for 14 days. 03/15/21 03/29/21 Yes Fredia Sorrow, MD  albuterol (PROVENTIL) (2.5 MG/3ML) 0.083% nebulizer solution Take 3 mLs (2.5 mg total) by nebulization every 6 (six) hours as needed for wheezing or shortness of breath. 02/25/20   Spero Geralds, MD  diphenhydrAMINE (BENADRYL) 50 MG capsule Take 50 mg by mouth 1 hour prior to your procedure. Patient not taking: No sig reported 07/15/20   Angelia Mould, MD  ferrous sulfate 325 (65 FE) MG tablet Take 1 tablet (325 mg total) by mouth daily with breakfast. Patient not taking: No sig reported 01/24/20   Orson Eva, MD  predniSONE (DELTASONE) 50 MG tablet One tablet (50mg ) 13 hours prior to procedure; one tablet (50mg ) 7 hours prior to procedure and then one tablet (50 mg) one hour prior to procedure. Patient not taking: No sig reported 07/15/20   Angelia Mould, MD  varenicline (CHANTIX CONTINUING MONTH PAK) 1 MG tablet Take 1 tablet (1 mg total) by mouth 2 (two) times  daily. Patient not taking: No sig reported 02/25/20   Spero Geralds, MD    Allergies    Hydrocodone and Iohexol  Review of Systems   Review of Systems  Unable to perform ROS: Dementia    Physical Exam Updated Vital Signs BP (!) 189/58   Pulse 68   Temp 98.8 F (37.1 C) (Oral)   Resp (!) 24   Ht 1.6 m (5\' 3" )   Wt 45.4 kg  SpO2 97%   BMI 17.71 kg/m   Physical Exam Vitals and nursing note reviewed.  Constitutional:      General: She is not in acute distress.    Appearance: She is well-developed.  HENT:     Head: Normocephalic.     Comments: Patient with swelling to the right eyelid forehead area with vesicles.  Vesicle on the bridge of the nose.  No vesicle to the tip of the nose.  Questionable vesicle developing on the right upper eyelid area.    Mouth/Throat:     Mouth: Mucous membranes are moist.  Eyes:     General: No scleral icterus.       Right eye: Discharge present.     Extraocular Movements: Extraocular movements intact.     Conjunctiva/sclera: Conjunctivae normal.     Pupils: Pupils are equal, round, and reactive to light.     Comments: Fluorescein staining of the right eye shows some punctate keratitis.  But no dendrites.  There is a good be discharged.  Watering of the eye.  Sclera is clear.  Fluorescein staining showed no ulcers.  Cardiovascular:     Rate and Rhythm: Normal rate and regular rhythm.     Heart sounds: No murmur heard.   Pulmonary:     Effort: Pulmonary effort is normal. No respiratory distress.     Breath sounds: Normal breath sounds.  Abdominal:     Palpations: Abdomen is soft.     Tenderness: There is no abdominal tenderness.  Musculoskeletal:        General: No swelling. Normal range of motion.     Cervical back: Neck supple.     Comments: Left foot amputation  Skin:    General: Skin is warm and dry.     Capillary Refill: Capillary refill takes 2 to 3 seconds.  Neurological:     General: No focal deficit present.     Mental  Status: She is alert. Mental status is at baseline.     ED Results / Procedures / Treatments   Labs (all labs ordered are listed, but only abnormal results are displayed) Labs Reviewed  COMPREHENSIVE METABOLIC PANEL - Abnormal; Notable for the following components:      Result Value   Sodium 133 (*)    Potassium 2.9 (*)    Chloride 94 (*)    Glucose, Bld 195 (*)    Creatinine, Ser 1.58 (*)    Calcium 8.4 (*)    Albumin 2.4 (*)    GFR, Estimated 34 (*)    All other components within normal limits  CBC WITH DIFFERENTIAL/PLATELET - Abnormal; Notable for the following components:   RBC 3.09 (*)    Hemoglobin 7.9 (*)    HCT 25.0 (*)    MCH 25.6 (*)    RDW 15.8 (*)    All other components within normal limits  URINALYSIS, ROUTINE W REFLEX MICROSCOPIC    EKG EKG Interpretation  Date/Time:  Wednesday March 15 2021 14:43:38 EDT Ventricular Rate:  67 PR Interval:    QRS Duration: 94 QT Interval:  480 QTC Calculation: 507 R Axis:   51 Text Interpretation: Moderate voltage criteria for LVH, may be normal variant ( Sokolow-Lyon , Cornell product ) Septal infarct , age undetermined Abnormal ECG Confirmed by Fredia Sorrow [phone]) on 03/15/2021 5:11:19 PM   Radiology CT Head Wo Contrast  Result Date: 03/15/2021 CLINICAL DATA:  Head trauma, minor. Facial trauma. Neck trauma. Additional history provided: Patient's daughter reports patient fell on  Monday, awoke this morning with right eye swollen, head trauma. EXAM: CT HEAD WITHOUT CONTRAST CT MAXILLOFACIAL WITHOUT CONTRAST CT CERVICAL SPINE WITHOUT CONTRAST TECHNIQUE: Multidetector CT imaging of the head, cervical spine, and maxillofacial structures were performed using the standard protocol without intravenous contrast. Multiplanar CT image reconstructions of the cervical spine and maxillofacial structures were also generated. COMPARISON:  Brain MRI 01/21/2020. Head CT 01/21/2020. CT angiogram neck 08/22/2020. FINDINGS: CT HEAD FINDINGS  Brain: Mild-to-moderate cerebral atrophy. Mild patchy and ill-defined hypoattenuation within the cerebral white matter, nonspecific but compatible with chronic small vessel ischemic disease. There is no acute intracranial hemorrhage. No demarcated cortical infarct. No extra-axial fluid collection. No evidence of intracranial mass. No midline shift. Partially empty sella turcica. Vascular: No hyperdense vessel.  Atherosclerotic calcifications. Skull: Normal. Negative for fracture or focal lesion. Other: Right forehead and periorbital soft tissue swelling. Small right mastoid effusion. CT MAXILLOFACIAL FINDINGS Mild motion degradation at the level of the mandible, limiting evaluation. Osseous: No acute maxillofacial fracture is identified. Orbits: Right forehead and periorbital soft tissue swelling. No acute finding within the orbits. The globes are normal in size and contour. The extraocular muscles and optic nerve sheath complexes are symmetric and unremarkable. Sinuses: Complete opacification of the right frontal sinus and of the anterior right ethmoid air cells. Moderate mucosal thickening within the left frontal sinus, left ethmoid air cells and posterior right ethmoid air cells. Moderate mucosal thickening and possible fluid level within the right sphenoid sinus. Complete opacification of the right maxillary sinus. Moderate mucosal thickening and possible fluid level within the left maxillary sinus. Soft tissues: Right forehead and periorbital soft tissue swelling. CT CERVICAL SPINE FINDINGS Alignment: Reversal of the expected cervical lordosis. 3 mm C3-C4, C4-C5 and C5-C6 grade 1 anterolisthesis. Skull base and vertebrae: The basion-dental and atlanto-dental intervals are maintained.No evidence of acute fracture to the cervical spine. Congenital nonunion of the anterior and posterior arches of C1. Soft tissues and spinal canal: No prevertebral fluid or swelling. No visible canal hematoma. Disc levels: Focal  spondylosis with multilevel disc space narrowing, disc bulges and facet arthrosis. No appreciable high-grade spinal canal stenosis. No high-grade bony neural foraminal narrowing. Upper chest: Partially imaged left upper lobe lung mass. New from the prior CTA neck of 08/22/2020, there is partially imaged infiltration of the left retroclavicular soft tissues. Additionally, the left first rib and portions of the anterolateral left second rib are poorly delineated. This may be secondary to interval surgery or erosion. IMPRESSION: CT head: 1. No evidence of acute intracranial abnormality. 2. Right forehead and periorbital soft tissue swelling. 3. Mild chronic small vessel ischemic disease within the cerebral white matter. 4. Mild-to-moderate cerebral atrophy. 5. Small right mastoid effusion. CT maxillofacial: 1. Mild motion degradation at the level of the mandible, limiting evaluation. 2. No evidence of acute maxillofacial fracture. 3. Right forehead and periorbital soft tissue swelling. 4. Severe paranasal sinus disease, as described. CT cervical spine: 1. No evidence of acute fracture to the cervical spine. 2. 3 mm C3-C4, C4-C5 and C5-C6 grade 1 anterolisthesis. C5-C6 grade 1 anterolisthesis is new as compared to the prior CTA neck of 08/22/2020. Consider a cervical spine MRI to exclude ligamentous injury at this level, as clinically warranted. 3. Cervical spondylosis, as described. 4. Partially imaged known left upper lobe lung mass. New from the prior CTA neck of 08/22/2020, there is partially imaged infiltration of the left retroclavicular soft tissues. Additionally, the left first rib and portions of the anterolateral left second rib are poorly  delineated. This may be secondary to interval surgery and/or osseous erosion. A nonemergent follow-up chest CT is recommended to exclude interval tumor progression, as clinically appropriate. Electronically Signed   By: Kellie Simmering DO   On: 03/15/2021 14:06   CT Cervical  Spine Wo Contrast  Result Date: 03/15/2021 CLINICAL DATA:  Head trauma, minor. Facial trauma. Neck trauma. Additional history provided: Patient's daughter reports patient fell on Monday, awoke this morning with right eye swollen, head trauma. EXAM: CT HEAD WITHOUT CONTRAST CT MAXILLOFACIAL WITHOUT CONTRAST CT CERVICAL SPINE WITHOUT CONTRAST TECHNIQUE: Multidetector CT imaging of the head, cervical spine, and maxillofacial structures were performed using the standard protocol without intravenous contrast. Multiplanar CT image reconstructions of the cervical spine and maxillofacial structures were also generated. COMPARISON:  Brain MRI 01/21/2020. Head CT 01/21/2020. CT angiogram neck 08/22/2020. FINDINGS: CT HEAD FINDINGS Brain: Mild-to-moderate cerebral atrophy. Mild patchy and ill-defined hypoattenuation within the cerebral white matter, nonspecific but compatible with chronic small vessel ischemic disease. There is no acute intracranial hemorrhage. No demarcated cortical infarct. No extra-axial fluid collection. No evidence of intracranial mass. No midline shift. Partially empty sella turcica. Vascular: No hyperdense vessel.  Atherosclerotic calcifications. Skull: Normal. Negative for fracture or focal lesion. Other: Right forehead and periorbital soft tissue swelling. Small right mastoid effusion. CT MAXILLOFACIAL FINDINGS Mild motion degradation at the level of the mandible, limiting evaluation. Osseous: No acute maxillofacial fracture is identified. Orbits: Right forehead and periorbital soft tissue swelling. No acute finding within the orbits. The globes are normal in size and contour. The extraocular muscles and optic nerve sheath complexes are symmetric and unremarkable. Sinuses: Complete opacification of the right frontal sinus and of the anterior right ethmoid air cells. Moderate mucosal thickening within the left frontal sinus, left ethmoid air cells and posterior right ethmoid air cells. Moderate  mucosal thickening and possible fluid level within the right sphenoid sinus. Complete opacification of the right maxillary sinus. Moderate mucosal thickening and possible fluid level within the left maxillary sinus. Soft tissues: Right forehead and periorbital soft tissue swelling. CT CERVICAL SPINE FINDINGS Alignment: Reversal of the expected cervical lordosis. 3 mm C3-C4, C4-C5 and C5-C6 grade 1 anterolisthesis. Skull base and vertebrae: The basion-dental and atlanto-dental intervals are maintained.No evidence of acute fracture to the cervical spine. Congenital nonunion of the anterior and posterior arches of C1. Soft tissues and spinal canal: No prevertebral fluid or swelling. No visible canal hematoma. Disc levels: Focal spondylosis with multilevel disc space narrowing, disc bulges and facet arthrosis. No appreciable high-grade spinal canal stenosis. No high-grade bony neural foraminal narrowing. Upper chest: Partially imaged left upper lobe lung mass. New from the prior CTA neck of 08/22/2020, there is partially imaged infiltration of the left retroclavicular soft tissues. Additionally, the left first rib and portions of the anterolateral left second rib are poorly delineated. This may be secondary to interval surgery or erosion. IMPRESSION: CT head: 1. No evidence of acute intracranial abnormality. 2. Right forehead and periorbital soft tissue swelling. 3. Mild chronic small vessel ischemic disease within the cerebral white matter. 4. Mild-to-moderate cerebral atrophy. 5. Small right mastoid effusion. CT maxillofacial: 1. Mild motion degradation at the level of the mandible, limiting evaluation. 2. No evidence of acute maxillofacial fracture. 3. Right forehead and periorbital soft tissue swelling. 4. Severe paranasal sinus disease, as described. CT cervical spine: 1. No evidence of acute fracture to the cervical spine. 2. 3 mm C3-C4, C4-C5 and C5-C6 grade 1 anterolisthesis. C5-C6 grade 1 anterolisthesis is new  as  compared to the prior CTA neck of 08/22/2020. Consider a cervical spine MRI to exclude ligamentous injury at this level, as clinically warranted. 3. Cervical spondylosis, as described. 4. Partially imaged known left upper lobe lung mass. New from the prior CTA neck of 08/22/2020, there is partially imaged infiltration of the left retroclavicular soft tissues. Additionally, the left first rib and portions of the anterolateral left second rib are poorly delineated. This may be secondary to interval surgery and/or osseous erosion. A nonemergent follow-up chest CT is recommended to exclude interval tumor progression, as clinically appropriate. Electronically Signed   By: Kellie Simmering DO   On: 03/15/2021 14:06   CT Maxillofacial WO CM  Result Date: 03/15/2021 CLINICAL DATA:  Head trauma, minor. Facial trauma. Neck trauma. Additional history provided: Patient's daughter reports patient fell on Monday, awoke this morning with right eye swollen, head trauma. EXAM: CT HEAD WITHOUT CONTRAST CT MAXILLOFACIAL WITHOUT CONTRAST CT CERVICAL SPINE WITHOUT CONTRAST TECHNIQUE: Multidetector CT imaging of the head, cervical spine, and maxillofacial structures were performed using the standard protocol without intravenous contrast. Multiplanar CT image reconstructions of the cervical spine and maxillofacial structures were also generated. COMPARISON:  Brain MRI 01/21/2020. Head CT 01/21/2020. CT angiogram neck 08/22/2020. FINDINGS: CT HEAD FINDINGS Brain: Mild-to-moderate cerebral atrophy. Mild patchy and ill-defined hypoattenuation within the cerebral white matter, nonspecific but compatible with chronic small vessel ischemic disease. There is no acute intracranial hemorrhage. No demarcated cortical infarct. No extra-axial fluid collection. No evidence of intracranial mass. No midline shift. Partially empty sella turcica. Vascular: No hyperdense vessel.  Atherosclerotic calcifications. Skull: Normal. Negative for fracture or  focal lesion. Other: Right forehead and periorbital soft tissue swelling. Small right mastoid effusion. CT MAXILLOFACIAL FINDINGS Mild motion degradation at the level of the mandible, limiting evaluation. Osseous: No acute maxillofacial fracture is identified. Orbits: Right forehead and periorbital soft tissue swelling. No acute finding within the orbits. The globes are normal in size and contour. The extraocular muscles and optic nerve sheath complexes are symmetric and unremarkable. Sinuses: Complete opacification of the right frontal sinus and of the anterior right ethmoid air cells. Moderate mucosal thickening within the left frontal sinus, left ethmoid air cells and posterior right ethmoid air cells. Moderate mucosal thickening and possible fluid level within the right sphenoid sinus. Complete opacification of the right maxillary sinus. Moderate mucosal thickening and possible fluid level within the left maxillary sinus. Soft tissues: Right forehead and periorbital soft tissue swelling. CT CERVICAL SPINE FINDINGS Alignment: Reversal of the expected cervical lordosis. 3 mm C3-C4, C4-C5 and C5-C6 grade 1 anterolisthesis. Skull base and vertebrae: The basion-dental and atlanto-dental intervals are maintained.No evidence of acute fracture to the cervical spine. Congenital nonunion of the anterior and posterior arches of C1. Soft tissues and spinal canal: No prevertebral fluid or swelling. No visible canal hematoma. Disc levels: Focal spondylosis with multilevel disc space narrowing, disc bulges and facet arthrosis. No appreciable high-grade spinal canal stenosis. No high-grade bony neural foraminal narrowing. Upper chest: Partially imaged left upper lobe lung mass. New from the prior CTA neck of 08/22/2020, there is partially imaged infiltration of the left retroclavicular soft tissues. Additionally, the left first rib and portions of the anterolateral left second rib are poorly delineated. This may be secondary to  interval surgery or erosion. IMPRESSION: CT head: 1. No evidence of acute intracranial abnormality. 2. Right forehead and periorbital soft tissue swelling. 3. Mild chronic small vessel ischemic disease within the cerebral white matter. 4. Mild-to-moderate cerebral atrophy. 5. Small  right mastoid effusion. CT maxillofacial: 1. Mild motion degradation at the level of the mandible, limiting evaluation. 2. No evidence of acute maxillofacial fracture. 3. Right forehead and periorbital soft tissue swelling. 4. Severe paranasal sinus disease, as described. CT cervical spine: 1. No evidence of acute fracture to the cervical spine. 2. 3 mm C3-C4, C4-C5 and C5-C6 grade 1 anterolisthesis. C5-C6 grade 1 anterolisthesis is new as compared to the prior CTA neck of 08/22/2020. Consider a cervical spine MRI to exclude ligamentous injury at this level, as clinically warranted. 3. Cervical spondylosis, as described. 4. Partially imaged known left upper lobe lung mass. New from the prior CTA neck of 08/22/2020, there is partially imaged infiltration of the left retroclavicular soft tissues. Additionally, the left first rib and portions of the anterolateral left second rib are poorly delineated. This may be secondary to interval surgery and/or osseous erosion. A nonemergent follow-up chest CT is recommended to exclude interval tumor progression, as clinically appropriate. Electronically Signed   By: Kellie Simmering DO   On: 03/15/2021 14:06    Procedures Procedures   CRITICAL CARE Performed by: Fredia Sorrow Total critical care time: 35 minutes Critical care time was exclusive of separately billable procedures and treating other patients. Critical care was necessary to treat or prevent imminent or life-threatening deterioration. Critical care was time spent personally by me on the following activities: development of treatment plan with patient and/or surrogate as well as nursing, discussions with consultants, evaluation of  patient's response to treatment, examination of patient, obtaining history from patient or surrogate, ordering and performing treatments and interventions, ordering and review of laboratory studies, ordering and review of radiographic studies, pulse oximetry and re-evaluation of patient's condition.   Medications Ordered in ED Medications  tetracaine (PONTOCAINE) 0.5 % ophthalmic solution 2 drop (2 drops Right Eye Not Given 03/15/21 1230)  fluorescein ophthalmic strip 1 strip (1 strip Right Eye Not Given 03/15/21 1230)  fluorescein ophthalmic strip 1 strip (has no administration in time range)  tetracaine (PONTOCAINE) 0.5 % ophthalmic solution 2 drop (has no administration in time range)  potassium chloride SA (KLOR-CON) CR tablet 40 mEq (40 mEq Oral Given 03/15/21 1839)    ED Course  I have reviewed the triage vital signs and the nursing notes.  Pertinent labs & imaging results that were available during my care of the patient were reviewed by me and considered in my medical decision making (see chart for details).    MDM Rules/Calculators/A&P                         Patient's work-up for the fall CT head face and neck without any acute abnormalities.  Lab work-up significant for hypokalemia with a potassium of 2.9.  Patient given oral potassium here and will continue potassium at home.  Renal function is abnormal but baseline with a GFR 34.  Will have patient take 40 mg of potassium for just 2 days at home.  Also evidence of anemia.  Hemoglobin is 7.9. Will have patient follow-up with primary care doctor regarding the potassium and the anemia.  Evidence of probably V1 herpes zoster.  Questionable involving the eye.  Discussed with on-call ophthalmology Dr. Stacie Glaze.  She is recommending valacyclovir for 14 days.  They will contact her to follow-up in the office.  She without any pain so does not need any pain medication.    Final Clinical Impression(s) / ED Diagnoses Final diagnoses:  Fall,  initial encounter  Injury of head, initial encounter  Hypokalemia  Anemia, unspecified type  Herpes zoster ophthalmicus of right eye    Rx / DC Orders ED Discharge Orders         Ordered    valACYclovir (VALTREX) 1000 MG tablet  3 times daily        03/15/21 2026    potassium chloride SA (KLOR-CON) 20 MEQ tablet  2 times daily        03/15/21 2026           Fredia Sorrow, MD 03/15/21 2035

## 2021-03-15 NOTE — Discharge Instructions (Addendum)
Discussed with ophthalmology information provided above but they are going to call and set up an appointment.  If you do not hear from them in 7 days call them.  Take the antiviral as prescribed for 10 days.  Follow-up with Dr. Riley Kill regarding the low potassium.  Take the potassium as directed for the next few days.  Also follow-up regarding the anemia.  Return for any new or worse symptoms.  CT of head neck and face without any injuries from the fall.  I would recommend some Imodium A-D for the diarrhea.

## 2021-03-15 NOTE — ED Triage Notes (Signed)
Pt brought to ED by daughter, states he fell on Monday. States she woke up this morning and her right eye was swollen. Pt states she tripped and fell on Monday, hit her head on the wall, denies LOC. Pt takes Plavix daily. Pt alert and oriented x 4.

## 2021-03-15 NOTE — ED Notes (Signed)
Pt on airborne precautions due to shingles. Therefore, nursing unable to perform visual acuity screening on pt because we can't take her out in the hallways being on precautions. Dr. Rogene Houston made aware.

## 2021-03-17 DIAGNOSIS — J449 Chronic obstructive pulmonary disease, unspecified: Secondary | ICD-10-CM | POA: Diagnosis not present

## 2021-03-20 ENCOUNTER — Telehealth: Payer: Self-pay | Admitting: Primary Care

## 2021-03-20 NOTE — Telephone Encounter (Signed)
Spoke with patient's daughter, Levada Dy, regarding the Palliative referral/services and all questions were answered and she was in agreement with starting services.  I have scheduled an In-home Consult for 03/27/21 @ 12 Noon.

## 2021-03-23 ENCOUNTER — Other Ambulatory Visit: Payer: Self-pay

## 2021-03-23 ENCOUNTER — Encounter (HOSPITAL_COMMUNITY): Payer: Self-pay | Admitting: Emergency Medicine

## 2021-03-23 ENCOUNTER — Emergency Department (HOSPITAL_COMMUNITY): Payer: Medicare HMO

## 2021-03-23 ENCOUNTER — Inpatient Hospital Stay (HOSPITAL_COMMUNITY)
Admission: EM | Admit: 2021-03-23 | Discharge: 2021-03-26 | DRG: 070 | Disposition: A | Discharge status: Home Health | Payer: Medicare HMO | Attending: Family Medicine | Admitting: Family Medicine

## 2021-03-23 DIAGNOSIS — I252 Old myocardial infarction: Secondary | ICD-10-CM | POA: Diagnosis not present

## 2021-03-23 DIAGNOSIS — G309 Alzheimer's disease, unspecified: Secondary | ICD-10-CM | POA: Diagnosis present

## 2021-03-23 DIAGNOSIS — I959 Hypotension, unspecified: Secondary | ICD-10-CM | POA: Diagnosis not present

## 2021-03-23 DIAGNOSIS — Z681 Body mass index (BMI) 19 or less, adult: Secondary | ICD-10-CM

## 2021-03-23 DIAGNOSIS — Z885 Allergy status to narcotic agent status: Secondary | ICD-10-CM

## 2021-03-23 DIAGNOSIS — R64 Cachexia: Secondary | ICD-10-CM | POA: Diagnosis present

## 2021-03-23 DIAGNOSIS — Z515 Encounter for palliative care: Secondary | ICD-10-CM | POA: Diagnosis not present

## 2021-03-23 DIAGNOSIS — F17210 Nicotine dependence, cigarettes, uncomplicated: Secondary | ICD-10-CM | POA: Diagnosis present

## 2021-03-23 DIAGNOSIS — E785 Hyperlipidemia, unspecified: Secondary | ICD-10-CM | POA: Diagnosis present

## 2021-03-23 DIAGNOSIS — F419 Anxiety disorder, unspecified: Secondary | ICD-10-CM | POA: Diagnosis present

## 2021-03-23 DIAGNOSIS — D509 Iron deficiency anemia, unspecified: Secondary | ICD-10-CM | POA: Diagnosis present

## 2021-03-23 DIAGNOSIS — E43 Unspecified severe protein-calorie malnutrition: Secondary | ICD-10-CM | POA: Diagnosis present

## 2021-03-23 DIAGNOSIS — W19XXXA Unspecified fall, initial encounter: Secondary | ICD-10-CM | POA: Diagnosis present

## 2021-03-23 DIAGNOSIS — K219 Gastro-esophageal reflux disease without esophagitis: Secondary | ICD-10-CM | POA: Diagnosis present

## 2021-03-23 DIAGNOSIS — N179 Acute kidney failure, unspecified: Secondary | ICD-10-CM | POA: Diagnosis present

## 2021-03-23 DIAGNOSIS — Z90710 Acquired absence of both cervix and uterus: Secondary | ICD-10-CM

## 2021-03-23 DIAGNOSIS — R4182 Altered mental status, unspecified: Secondary | ICD-10-CM

## 2021-03-23 DIAGNOSIS — E1151 Type 2 diabetes mellitus with diabetic peripheral angiopathy without gangrene: Secondary | ICD-10-CM | POA: Diagnosis present

## 2021-03-23 DIAGNOSIS — Z794 Long term (current) use of insulin: Secondary | ICD-10-CM

## 2021-03-23 DIAGNOSIS — E119 Type 2 diabetes mellitus without complications: Secondary | ICD-10-CM

## 2021-03-23 DIAGNOSIS — J449 Chronic obstructive pulmonary disease, unspecified: Secondary | ICD-10-CM | POA: Diagnosis present

## 2021-03-23 DIAGNOSIS — N183 Chronic kidney disease, stage 3 unspecified: Secondary | ICD-10-CM | POA: Diagnosis present

## 2021-03-23 DIAGNOSIS — G9341 Metabolic encephalopathy: Secondary | ICD-10-CM | POA: Diagnosis present

## 2021-03-23 DIAGNOSIS — E860 Dehydration: Secondary | ICD-10-CM | POA: Diagnosis present

## 2021-03-23 DIAGNOSIS — B0230 Zoster ocular disease, unspecified: Secondary | ICD-10-CM | POA: Diagnosis present

## 2021-03-23 DIAGNOSIS — G9340 Encephalopathy, unspecified: Secondary | ICD-10-CM | POA: Diagnosis present

## 2021-03-23 DIAGNOSIS — R410 Disorientation, unspecified: Secondary | ICD-10-CM

## 2021-03-23 DIAGNOSIS — Z7189 Other specified counseling: Secondary | ICD-10-CM

## 2021-03-23 DIAGNOSIS — Z888 Allergy status to other drugs, medicaments and biological substances status: Secondary | ICD-10-CM

## 2021-03-23 DIAGNOSIS — Z8673 Personal history of transient ischemic attack (TIA), and cerebral infarction without residual deficits: Secondary | ICD-10-CM

## 2021-03-23 DIAGNOSIS — Z955 Presence of coronary angioplasty implant and graft: Secondary | ICD-10-CM

## 2021-03-23 DIAGNOSIS — Z89432 Acquired absence of left foot: Secondary | ICD-10-CM

## 2021-03-23 DIAGNOSIS — J984 Other disorders of lung: Secondary | ICD-10-CM

## 2021-03-23 DIAGNOSIS — Z20822 Contact with and (suspected) exposure to covid-19: Secondary | ICD-10-CM | POA: Diagnosis present

## 2021-03-23 DIAGNOSIS — R404 Transient alteration of awareness: Secondary | ICD-10-CM | POA: Diagnosis not present

## 2021-03-23 DIAGNOSIS — D638 Anemia in other chronic diseases classified elsewhere: Secondary | ICD-10-CM | POA: Diagnosis present

## 2021-03-23 DIAGNOSIS — I2510 Atherosclerotic heart disease of native coronary artery without angina pectoris: Secondary | ICD-10-CM | POA: Diagnosis present

## 2021-03-23 DIAGNOSIS — R6251 Failure to thrive (child): Secondary | ICD-10-CM | POA: Diagnosis present

## 2021-03-23 DIAGNOSIS — E1122 Type 2 diabetes mellitus with diabetic chronic kidney disease: Secondary | ICD-10-CM | POA: Diagnosis present

## 2021-03-23 DIAGNOSIS — F0280 Dementia in other diseases classified elsewhere without behavioral disturbance: Secondary | ICD-10-CM | POA: Diagnosis present

## 2021-03-23 DIAGNOSIS — I10 Essential (primary) hypertension: Secondary | ICD-10-CM | POA: Diagnosis not present

## 2021-03-23 DIAGNOSIS — R531 Weakness: Secondary | ICD-10-CM | POA: Diagnosis not present

## 2021-03-23 DIAGNOSIS — R627 Adult failure to thrive: Secondary | ICD-10-CM | POA: Diagnosis present

## 2021-03-23 DIAGNOSIS — Z79899 Other long term (current) drug therapy: Secondary | ICD-10-CM

## 2021-03-23 DIAGNOSIS — I129 Hypertensive chronic kidney disease with stage 1 through stage 4 chronic kidney disease, or unspecified chronic kidney disease: Secondary | ICD-10-CM | POA: Diagnosis present

## 2021-03-23 DIAGNOSIS — Z7983 Long term (current) use of bisphosphonates: Secondary | ICD-10-CM

## 2021-03-23 DIAGNOSIS — Z66 Do not resuscitate: Secondary | ICD-10-CM | POA: Diagnosis present

## 2021-03-23 DIAGNOSIS — Z7982 Long term (current) use of aspirin: Secondary | ICD-10-CM

## 2021-03-23 DIAGNOSIS — Z8249 Family history of ischemic heart disease and other diseases of the circulatory system: Secondary | ICD-10-CM

## 2021-03-23 DIAGNOSIS — Z833 Family history of diabetes mellitus: Secondary | ICD-10-CM

## 2021-03-23 DIAGNOSIS — N1832 Chronic kidney disease, stage 3b: Secondary | ICD-10-CM | POA: Diagnosis present

## 2021-03-23 LAB — URINALYSIS, ROUTINE W REFLEX MICROSCOPIC
Bilirubin Urine: NEGATIVE
Glucose, UA: NEGATIVE mg/dL
Hgb urine dipstick: NEGATIVE
Ketones, ur: NEGATIVE mg/dL
Nitrite: POSITIVE — AB
Protein, ur: NEGATIVE mg/dL
Specific Gravity, Urine: 1.01 (ref 1.005–1.030)
pH: 6 (ref 5.0–8.0)

## 2021-03-23 LAB — RESP PANEL BY RT-PCR (FLU A&B, COVID) ARPGX2
Influenza A by PCR: NEGATIVE
Influenza B by PCR: NEGATIVE
SARS Coronavirus 2 by RT PCR: NEGATIVE

## 2021-03-23 LAB — CBC WITH DIFFERENTIAL/PLATELET
Abs Immature Granulocytes: 0.03 10*3/uL (ref 0.00–0.07)
Basophils Absolute: 0.1 10*3/uL (ref 0.0–0.1)
Basophils Relative: 1 %
Eosinophils Absolute: 0.2 10*3/uL (ref 0.0–0.5)
Eosinophils Relative: 3 %
HCT: 25.3 % — ABNORMAL LOW (ref 36.0–46.0)
Hemoglobin: 7.9 g/dL — ABNORMAL LOW (ref 12.0–15.0)
Immature Granulocytes: 1 %
Lymphocytes Relative: 27 %
Lymphs Abs: 1.6 10*3/uL (ref 0.7–4.0)
MCH: 24.9 pg — ABNORMAL LOW (ref 26.0–34.0)
MCHC: 31.2 g/dL (ref 30.0–36.0)
MCV: 79.8 fL — ABNORMAL LOW (ref 80.0–100.0)
Monocytes Absolute: 0.3 10*3/uL (ref 0.1–1.0)
Monocytes Relative: 6 %
Neutro Abs: 3.5 10*3/uL (ref 1.7–7.7)
Neutrophils Relative %: 62 %
Platelets: 358 10*3/uL (ref 150–400)
RBC: 3.17 MIL/uL — ABNORMAL LOW (ref 3.87–5.11)
RDW: 16 % — ABNORMAL HIGH (ref 11.5–15.5)
WBC: 5.7 10*3/uL (ref 4.0–10.5)
nRBC: 0 % (ref 0.0–0.2)

## 2021-03-23 LAB — COMPREHENSIVE METABOLIC PANEL
ALT: 12 U/L (ref 0–44)
AST: 20 U/L (ref 15–41)
Albumin: 2.5 g/dL — ABNORMAL LOW (ref 3.5–5.0)
Alkaline Phosphatase: 82 U/L (ref 38–126)
Anion gap: 8 (ref 5–15)
BUN: 20 mg/dL (ref 8–23)
CO2: 24 mmol/L (ref 22–32)
Calcium: 8.8 mg/dL — ABNORMAL LOW (ref 8.9–10.3)
Chloride: 103 mmol/L (ref 98–111)
Creatinine, Ser: 1.94 mg/dL — ABNORMAL HIGH (ref 0.44–1.00)
GFR, Estimated: 26 mL/min — ABNORMAL LOW (ref >60)
Glucose, Bld: 101 mg/dL — ABNORMAL HIGH (ref 70–99)
Potassium: 4 mmol/L (ref 3.5–5.1)
Sodium: 135 mmol/L (ref 135–145)
Total Bilirubin: 0.8 mg/dL (ref 0.3–1.2)
Total Protein: 7.8 g/dL (ref 6.5–8.1)

## 2021-03-23 LAB — GLUCOSE, CAPILLARY: Glucose-Capillary: 105 mg/dL — ABNORMAL HIGH (ref 70–99)

## 2021-03-23 LAB — TSH: TSH: 4.888 u[IU]/mL — ABNORMAL HIGH (ref 0.350–4.500)

## 2021-03-23 LAB — CBG MONITORING, ED
Glucose-Capillary: 102 mg/dL — ABNORMAL HIGH (ref 70–99)
Glucose-Capillary: 76 mg/dL (ref 70–99)

## 2021-03-23 LAB — PROTIME-INR
INR: 1.2 (ref 0.8–1.2)
Prothrombin Time: 14.8 seconds (ref 11.4–15.2)

## 2021-03-23 LAB — TROPONIN I (HIGH SENSITIVITY)
Troponin I (High Sensitivity): 14 ng/L (ref <18)
Troponin I (High Sensitivity): 13 ng/L (ref <18)

## 2021-03-23 LAB — POC OCCULT BLOOD, ED: Fecal Occult Bld: NEGATIVE

## 2021-03-23 LAB — VITAMIN B12: Vitamin B-12: 1479 pg/mL — ABNORMAL HIGH (ref 180–914)

## 2021-03-23 MED ORDER — DONEPEZIL HCL 5 MG PO TABS
5.0000 mg | ORAL_TABLET | Freq: Every day | ORAL | Status: DC
  Administered 2021-03-23 – 2021-03-25 (×3): 5 mg via ORAL
  Filled 2021-03-23 (×3): qty 1

## 2021-03-23 MED ORDER — ACETAMINOPHEN 650 MG RE SUPP
650.0000 mg | Freq: Four times a day (QID) | RECTAL | Status: DC | PRN
Start: 2021-03-23 — End: 2021-03-26

## 2021-03-23 MED ORDER — ALBUTEROL SULFATE (2.5 MG/3ML) 0.083% IN NEBU: 2.5000 mg | INHALATION_SOLUTION | Freq: Four times a day (QID) | RESPIRATORY_TRACT | Status: DC | PRN

## 2021-03-23 MED ORDER — VALACYCLOVIR HCL 500 MG PO TABS
1000.0000 mg | ORAL_TABLET | Freq: Two times a day (BID) | ORAL | Status: DC
  Administered 2021-03-23 – 2021-03-26 (×7): 1000 mg via ORAL
  Filled 2021-03-23 (×7): qty 2

## 2021-03-23 MED ORDER — SODIUM CHLORIDE 0.9 % IV BOLUS
1000.0000 mL | Freq: Once | INTRAVENOUS | Status: AC
  Administered 2021-03-23: 1000 mL via INTRAVENOUS

## 2021-03-23 MED ORDER — ATORVASTATIN CALCIUM 20 MG PO TABS
20.0000 mg | ORAL_TABLET | Freq: Every day | ORAL | Status: DC
  Administered 2021-03-23 – 2021-03-26 (×4): 20 mg via ORAL
  Filled 2021-03-23: qty 2
  Filled 2021-03-23 (×3): qty 1

## 2021-03-23 MED ORDER — ONDANSETRON HCL 4 MG/2ML IJ SOLN: 4.0000 mg | Freq: Four times a day (QID) | INTRAMUSCULAR | Status: DC | PRN

## 2021-03-23 MED ORDER — ASPIRIN 81 MG PO CHEW
81.0000 mg | CHEWABLE_TABLET | Freq: Every day | ORAL | Status: DC
  Administered 2021-03-23 – 2021-03-26 (×4): 81 mg via ORAL
  Filled 2021-03-23 (×4): qty 1

## 2021-03-23 MED ORDER — VITAMIN D 25 MCG (1000 UNIT) PO TABS
2000.0000 [IU] | ORAL_TABLET | Freq: Every day | ORAL | Status: DC
  Administered 2021-03-23 – 2021-03-26 (×4): 2000 [IU] via ORAL
  Filled 2021-03-23 (×4): qty 2

## 2021-03-23 MED ORDER — ACETAMINOPHEN 325 MG PO TABS: 650.0000 mg | ORAL_TABLET | Freq: Four times a day (QID) | ORAL | Status: DC | PRN

## 2021-03-23 MED ORDER — ESCITALOPRAM OXALATE 10 MG PO TABS
10.0000 mg | ORAL_TABLET | Freq: Every day | ORAL | Status: DC
  Administered 2021-03-23 – 2021-03-26 (×4): 10 mg via ORAL
  Filled 2021-03-23 (×4): qty 1

## 2021-03-23 MED ORDER — ALBUTEROL SULFATE HFA 108 (90 BASE) MCG/ACT IN AERS: 2.0000 | INHALATION_SPRAY | Freq: Four times a day (QID) | RESPIRATORY_TRACT | Status: DC | PRN

## 2021-03-23 MED ORDER — ONDANSETRON HCL 4 MG PO TABS: 4.0000 mg | ORAL_TABLET | Freq: Four times a day (QID) | ORAL | Status: DC | PRN

## 2021-03-23 MED ORDER — DRONABINOL 5 MG PO CAPS
5.0000 mg | ORAL_CAPSULE | Freq: Two times a day (BID) | ORAL | Status: DC
  Administered 2021-03-23 – 2021-03-26 (×7): 5 mg via ORAL
  Filled 2021-03-23 (×4): qty 1
  Filled 2021-03-23: qty 2
  Filled 2021-03-23 (×2): qty 1

## 2021-03-23 MED ORDER — CLOPIDOGREL BISULFATE 75 MG PO TABS
75.0000 mg | ORAL_TABLET | Freq: Every day | ORAL | Status: DC
  Administered 2021-03-23 – 2021-03-26 (×4): 75 mg via ORAL
  Filled 2021-03-23 (×4): qty 1

## 2021-03-23 MED ORDER — SODIUM CHLORIDE 0.9 % IV SOLN: INTRAVENOUS | Status: AC

## 2021-03-23 MED ORDER — LABETALOL HCL 200 MG PO TABS
300.0000 mg | ORAL_TABLET | Freq: Two times a day (BID) | ORAL | Status: DC
  Administered 2021-03-23 – 2021-03-26 (×6): 300 mg via ORAL
  Filled 2021-03-23 (×7): qty 2

## 2021-03-23 MED ORDER — ENSURE ENLIVE PO LIQD
237.0000 mL | Freq: Two times a day (BID) | ORAL | Status: DC
  Administered 2021-03-24 – 2021-03-26 (×5): 237 mL via ORAL

## 2021-03-23 NOTE — H&P (Signed)
History and Physical    Sherri Brooks WUJ:[id number] DOB: 1945-12-05 DOA: 03/23/2021  PCP: Redmond School, MD   Patient coming from: Home.  I have personally briefly reviewed patient's old medical records in [place]  Chief Complaint: Altered mental status.  HPI: Sherri Brooks is a 75 y.o. female with medical history significant of Alzheimer's disease, anemia, anxiety, osteoarthritis, asthma, CAD, history of MI, cellulitis of left buttock, type II DM, GERD, history of heart murmur, hyperlipidemia, hypertension, PVD, COPD, active smoker, left upper lobe mass who is brought to the emergency department by her daughter due to progressively worse decline in mental status of several weeks, but particularly more pronounced since yesterday around noontime when the patient had 2 episodes of "arm flaring or jerking" within 60 to 90 minutes apart followed by periods of disorientation.  Her appetite has been decreased over the past year.  She has lost 40 to 50 pounds.  She declined to proceed with diagnosis and treatment of the mass after she declined diagnostic procedures offered by pulmonology.  No fever, nausea, vomiting or diarrhea.  ED Course: Initial vital signs were temperature 94.7 F, pulse 50, respirations 16, BP 133/53 mmHg O2 sat 100% on room air.  After 2 hours, the temperature increased to 110 F with warming measures.  She was given a 1000 mL LR bolus.  Lab work: Her CBC showed a white count 5.7, hemoglobin 7.9 g/dL platelets 358.  Normal PT and INR.  First troponin was negative.  CMP showed normal electrolytes when calcium is corrected to albumin.  Glucose 101 and creatinine 1.94 mg/dL.  Albumin was 2.5 g/dL.  The rest of the CMP results were unremarkable.    Imaging: Chest radiograph shows significant enlargement of LUL mass.  Multiple chronic findings on head CT, but no acute intracranial pathology.  Please see images and full detailed radiology report.  Review of Systems: As per HPI  otherwise all other systems reviewed and are negative.  Past Medical History:  Diagnosis Date   Alzheimer disease ([place])    Anemia    Anxiety    Arthritis    Asthma    CAD (coronary artery disease)    Cellulitis of buttock, left 2010   Diabetes mellitus    GERD (gastroesophageal reflux disease)    Heart murmur    Hyperlipidemia    Hypertension    MI (myocardial infarction) ([place])    PVD (peripheral vascular disease) ([place])    Wears glasses     Past Surgical History:  Procedure Laterality Date   ABDOMINAL AORTAGRAM N/A 07/28/2012   Procedure: ABDOMINAL Maxcine Ham;  Surgeon: Angelia Mould, MD;  [facility] CATH LAB;  Service: Cardiovascular;  Laterality: N/A;   ABDOMINAL AORTOGRAM W/LOWER EXTREMITY N/A 06/10/2017   Procedure: ABDOMINAL AORTOGRAM W/LOWER EXTREMITY;  Surgeon: Angelia Mould, MD;  Location: [place] CV LAB;  Service: Cardiovascular;  Laterality: N/A;   ABDOMINAL HYSTERECTOMY     COLONOSCOPY  10/26/2011   Procedure: COLONOSCOPY;  Surgeon: Dorothyann Peng, MD;  Location: [facility];  Service: Endoscopy;  Laterality: N/A;  1:30   CORONARY STENT INTERVENTION N/A 06/14/2017   Procedure: CORONARY STENT INTERVENTION;  Surgeon: Leonie Man, MD;  Location: [place] CV LAB;  Service: Cardiovascular;  Laterality: N/A;   FEMORAL-POPLITEAL BYPASS GRAFT  07/2010   pt has had 3 fem-pop bpg   FEMORAL-POPLITEAL BYPASS GRAFT  11/04/2012   Procedure: BYPASS GRAFT FEMORAL-POPLITEAL ARTERY;  Surgeon: Angelia Mould, MD;  Location: [place];  Service: Vascular;  Laterality: Left;  Redo Left Femoral Popliteal Bypass with PTFE   FOOT AMPUTATION  2010   left   LEFT HEART CATH AND CORONARY ANGIOGRAPHY N/A 06/14/2017   Procedure: LEFT HEART CATH AND CORONARY ANGIOGRAPHY;  Surgeon: Leonie Man, MD;  Location: [place] CV LAB;  Service: Cardiovascular;  Laterality: N/A;   LOWER EXTREMITY ANGIOGRAM Bilateral 07/28/2012   Procedure: LOWER EXTREMITY ANGIOGRAM;   Surgeon: Angelia Mould, MD;  Location: [facility] CATH LAB;  Service: Cardiovascular;  Laterality: Bilateral;   LOWER EXTREMITY ANGIOGRAM Left 01/02/2016   Procedure: Lower Extremity Angiogram;  Surgeon: Angelia Mould, MD;  Location: [place] CV LAB;  Service: Cardiovascular;  Laterality: Left;   LOWER EXTREMITY ANGIOGRAM Left 07/03/2017   Procedure: Lower Extremity Angiogram;  Surgeon: Waynetta Sandy, MD;  Location: [place] CV LAB;  Service: Cardiovascular;  Laterality: Left;   LOWER EXTREMITY INTERVENTION Left 07/03/2017   Procedure: Lower Extremity Intervention;  Surgeon: Waynetta Sandy, MD;  Location: [place] CV LAB;  Service: Cardiovascular;  Laterality: Left;   ORIF ANKLE FRACTURE Left 01/27/2013   Procedure: OPEN REDUCTION INTERNAL FIXATION (ORIF) ANKLE FRACTURE;  Surgeon: Wylene Simmer, MD;  Location: [place];  Service: Orthopedics;  Laterality: Left;   PERCUTANEOUS STENT INTERVENTION Left 07/28/2012   Procedure: PERCUTANEOUS STENT INTERVENTION;  Surgeon: Angelia Mould, MD;  Location: [facility] CATH LAB;  Service: Cardiovascular;  Laterality: Left;  lt ext tiliac stentx1   PERIPHERAL VASCULAR BALLOON ANGIOPLASTY Left 06/10/2017   Procedure: PERIPHERAL VASCULAR BALLOON ANGIOPLASTY;  Surgeon: Angelia Mould, MD;  Location: [place] CV LAB;  Service: Cardiovascular;  Laterality: Left;  external iliac   PERIPHERAL VASCULAR CATHETERIZATION N/A 01/02/2016   Procedure: Abdominal Aortogram;  Surgeon: Angelia Mould, MD;  Location: [place] CV LAB;  Service: Cardiovascular;  Laterality: N/A;   PERIPHERAL VASCULAR CATHETERIZATION Left 01/02/2016   Procedure: Peripheral Vascular Intervention;  Surgeon: Angelia Mould, MD;  Location: [place] CV LAB;  Service: Cardiovascular;  Laterality: Left;  lt ext iliac   right common iliac stent  10/2009   Dr Doren Custard ([place])   S/P Hysterecotmy     partial   TONSILLECTOMY     VASCULAR  SURGERY      Social History  reports that she has been smoking cigarettes. She started smoking about 8 years ago. She has a 15.00 pack-year smoking history. She has never used smokeless tobacco. She reports that she does not drink alcohol and does not use drugs.  Allergies  Allergen Reactions   Hydrocodone Hives and Itching   Iohexol Hives         Family History  Problem Relation Age of Onset   Brain cancer Father    Cancer Father    Diabetes Mother        many family members   Alzheimer's disease Mother    Heart disease Other    Prior to Admission medications   Medication Sig Start Date End Date Taking? Authorizing Provider  albuterol (PROVENTIL HFA;VENTOLIN HFA) 108 (90 BASE) MCG/ACT inhaler Inhale 2 puffs into the lungs every 6 (six) hours as needed. For bronchitis and coughing   Yes [provider]  albuterol (PROVENTIL) (2.5 MG/3ML) 0.083% nebulizer solution Take 3 mLs (2.5 mg total) by nebulization every 6 (six) hours as needed for wheezing or shortness of breath. 02/25/20  Yes Spero Geralds, MD  alendronate (FOSAMAX) 70 MG tablet Take 70 mg by mouth every 7 (seven) days.  08/30/11  Yes [provider]  ALPRAZolam Duanne Moron) 0.5 MG tablet Take 0.5 mg by mouth 3 (three) times daily. 01/01/20  Yes [provider]  aspirin 81 MG chewable tablet Chew 1 tablet (81 mg total) by mouth daily. 06/16/17  Yes Velvet Bathe, MD  atorvastatin (LIPITOR) 20 MG tablet Take 1 tablet (20 mg total) by mouth at bedtime. Patient taking differently: Take 20 mg by mouth daily. 06/15/17  Yes Velvet Bathe, MD  Cholecalciferol (VITAMIN D) 50 MCG (2000 UT) CAPS Take 2,000 Units by mouth daily.   Yes [provider]  clopidogrel (PLAVIX) 75 MG tablet Take 75 mg by mouth daily.   Yes [provider]  donepezil (ARICEPT) 5 MG tablet Take 5 mg by mouth at bedtime.  04/01/20  Yes [provider]  dronabinol (MARINOL) 5 MG capsule Take 5 mg by mouth 2 (two)  times daily before lunch and supper.  04/02/20  Yes [provider]  escitalopram (LEXAPRO) 10 MG tablet Take 10 mg by mouth daily. 06/28/20  Yes [provider]  furosemide (LASIX) 20 MG tablet Take 20 mg by mouth every morning. 01/01/20  Yes [provider]  insulin glargine (LANTUS) 100 UNIT/ML injection Inject 0.2 mLs (20 Units total) into the skin at bedtime. Patient taking differently: Inject 30 Units into the skin daily as needed (blood sugar). 06/15/17  Yes Velvet Bathe, MD  labetalol (NORMODYNE) 300 MG tablet Take 1 tablet (300 mg total) by mouth 2 (two) times daily. 06/15/17  Yes Velvet Bathe, MD  potassium chloride SA (KLOR-CON) 20 MEQ tablet Take 1 tablet (20 mEq total) by mouth 2 (two) times daily. 03/15/21  Yes Fredia Sorrow, MD  predniSONE (DELTASONE) 50 MG tablet One tablet (50mg ) 13 hours prior to procedure; one tablet (50mg ) 7 hours prior to procedure and then one tablet (50 mg) one hour prior to procedure. 07/15/20  Yes Angelia Mould, MD  valACYclovir (VALTREX) 1000 MG tablet Take 1 tablet (1,000 mg total) by mouth 3 (three) times daily for 14 days. 03/15/21 03/29/21 Yes Fredia Sorrow, MD  diphenhydrAMINE (BENADRYL) 50 MG capsule Take 50 mg by mouth 1 hour prior to your procedure. Patient not taking: No sig reported 07/15/20   Angelia Mould, MD  ferrous sulfate 325 (65 FE) MG tablet Take 1 tablet (325 mg total) by mouth daily with breakfast. Patient not taking: No sig reported 01/24/20   Orson Eva, MD  varenicline (CHANTIX CONTINUING MONTH PAK) 1 MG tablet Take 1 tablet (1 mg total) by mouth 2 (two) times daily. Patient not taking: No sig reported 02/25/20   Spero Geralds, MD    Physical Exam: Vitals:   03/23/21 0237 03/23/21 0300 03/23/21 0430 03/23/21 0630  BP: (!) 133/53 (!) 163/59 (!) 137/53 (!) 162/56  Pulse: (!) 50 (!) 51 (!) 57 (!) 54  Resp: 16 18 19 19   Temp: (!) 94.7 F (34.8 C)  (!) 96 F (35.6 C)   TempSrc: Core  Axillary    SpO2: 100% 99% 94% 99%  Weight: 45 kg     Height: 5\' 3"  (1.6 m)       Constitutional: Cachectic.  Looks terminally ill.  NAD, calm, comfortable Eyes: PERRL, lids and conjunctivae normal ENMT: Mucous membranes are mildly dry. Posterior pharynx clear of any exudate or lesions. Neck: normal, supple, no masses, no thyromegaly Respiratory: clear to auscultation bilaterally, no wheezing, no crackles. Normal respiratory effort. No accessory muscle use.  Cardiovascular: Regular rate and rhythm, no  murmurs / rubs / gallops. No extremity edema. 2+ pedal pulses. No carotid bruits.  Abdomen: No distention.  Bowel sounds positive.  Soft, no tenderness, no masses palpated. No hepatosplenomegaly. Musculoskeletal: Severe generalized weakness.  Left foot amputation.  No clubbing / cyanosis. Good ROM, no contractures. Normal muscle tone.  Skin: Healing Neurologic: CN 2-12 grossly intact. Sensation intact, DTR normal. Strength 5/5 in all 4.  Psychiatric: Somnolent, wakes up briefly, may answer simple questions, but otherwise may say unintelligible words.  Labs on Admission: I have personally reviewed following labs and imaging studies  CBC: Recent Labs  Lab 03/23/21 0235  WBC 5.7  NEUTROABS 3.5  HGB 7.9*  HCT 25.3*  MCV 79.8*  PLT 191    Basic Metabolic Panel: Recent Labs  Lab 03/23/21 0235  NA 135  K 4.0  CL 103  CO2 24  GLUCOSE 101*  BUN 20  CREATININE 1.94*  CALCIUM 8.8*   GFR: Estimated Creatinine Clearance: 17.5 mL/min (A) (by C-G formula based on SCr of 1.94 mg/dL (H)).  Liver Function Tests: Recent Labs  Lab 03/23/21 0235  AST 20  ALT 12  ALKPHOS 82  BILITOT 0.8  PROT 7.8  ALBUMIN 2.5*   Radiological Exams on Admission: DG Chest 1 View  Result Date: 03/23/2021 CLINICAL DATA:  Altered mental status EXAM: CHEST  1 VIEW COMPARISON:  09/10/2020 FINDINGS: Left apical mass has significantly enlarged in the interval measuring 6.9 x 5.9 cm, in keeping with a enlarging  primary neoplasm. Right lung is clear. No pneumothorax or pleural effusion. Cardiac size within normal limits. Pulmonary vascularity is normal. The anterior left first rib appears eroded. IMPRESSION: Progressive enlargement of left apical mass now with left first rib erosion. Electronically Signed   By: Fidela Salisbury MD   On: 03/23/2021 03:26   CT HEAD WO CONTRAST  Result Date: 03/23/2021 CLINICAL DATA:  Confusion EXAM: CT HEAD WITHOUT CONTRAST TECHNIQUE: Contiguous axial images were obtained from the base of the skull through the vertex without intravenous contrast. COMPARISON:  CT head 03/15/2021, PT CT 03/07/20, MRI head 01/21/2020 FINDINGS: Brain: Cerebral ventricle sizes are concordant with the degree of cerebral volume loss. Patchy and confluent areas of decreased attenuation are noted throughout the deep and periventricular white matter of the cerebral hemispheres bilaterally, compatible with chronic microvascular ischemic disease. No evidence of large-territorial acute infarction. No parenchymal hemorrhage. No mass lesion. No extra-axial collection. No mass effect or midline shift. No hydrocephalus. Basilar cisterns are patent. Vascular: No hyperdense vessel. Atherosclerotic calcifications are present within the cavernous internal carotid arteries. Skull: No acute fracture or focal lesion. Sinuses/Orbits: Persistent bilateral partial opacification of mastoid air cells, right greater than left. No fluid within the middle ears. Almost complete opacification of bilateral ethmoid sinuses. Mucosal thickening of the right sphenoid and left frontal sinus. Complete opacification of the right frontal sinus. The orbits are unremarkable. Other: None. IMPRESSION: 1. No acute intracranial abnormality. 2. Pansinusitis with partial opacification of bilateral mastoid air cells. Electronically Signed   By: Iven Finn M.D.   On: 03/23/2021 03:35    EKG: Independently reviewed.  Vent. rate 51 BPM PR interval *  ms QRS duration 120 ms QT/QTcB 492/454 ms P-R-T axes * -11 139 Junctional rhythm? Artifact  Assessment/Plan Principal Problem:   Acute metabolic encephalopathy No clear etiology. However, "arm jerking" suspicious for seizure activity. Will obtain an EEG. Routine neurology consult. Frequent neurochecks. Consider MRI imaging if no clear etiology found.  Active Problems:   Hypothermia Resolving. Monitor  temperature closely.    Type 2 diabetes mellitus (HCC) Carbohydrate modified diet once more alert. Lantus being used sporadically due to risk of hypoglycemia. CBG monitoring with RI SS.    Essential hypertension Hold furosemide. Continue labetalol 300 mg p.o. twice daily.    Cavitating mass in left upper lung lobe The patient declined diagnostic procedures. Discussed the likelihood of malignancy with her daughter. Consider palliative care evaluation.    COPD (chronic obstructive pulmonary disease) ([place]) Supplemental oxygen as needed. Continue SABA as needed.    Microcytic anemia Check stool occult blood. Monitor H&H. Consider gastroenterology evaluation.    Acute renal failure superimposed on stage 3 chronic kidney disease (HCC) Time-limited IV fluids. Monitor intake and output. Monitor renal function electrolytes.    Herpes zoster right eye Continue valacyclovir 1000 mg twice daily. Keep in isolation until risk of contagion decreased.    DVT prophylaxis: SCDs. Code Status:   Full code. Family Communication:  Her daughter Levada Dy was by bedside. Disposition Plan:   Patient is from:  Home.  Anticipated DC to:  Home.  Anticipated DC date:  03/25/2019 to 03/25/2021.  Anticipated DC barriers: Pending EEG and neurology consult.  Consults called:  Routine neurology consult. Admission status:  Observation/stepdown.   Severity of Illness:  High severity after presenting with acute metabolic encephalopathy in the setting of seizure-like activity. Reubin Milan MD [facility]  How to contact the [facility] Attending or Consulting provider [place] or covering provider during after hours [place], for this patient?   Check the care team in [facility] and look for a) attending/consulting [facility] provider listed and b) the [facility] team listed Log into [email or web address] and use 's universal password to access. If you do not have the password, please contact the hospital operator. Locate the [facility] provider you are looking for under [facility] and page to a number that you can be directly reached. If you still have difficulty reaching the provider, please page the [facility] (Director on Call) for the Hospitalists listed on amion for assistance.  03/23/2021, 6:42 AM   This document was prepared using Dragon voice recognition software and may contain some unintended transcription errors.

## 2021-03-23 NOTE — ED Triage Notes (Signed)
[facility] - pt brought for a fall and AMS. Pt also c/o weakness with dark stools.

## 2021-03-23 NOTE — Consult Note (Signed)
Consultation Note Date: 03/23/2021   Patient Name: Sherri Brooks  DOB: 02/16/1946  MRN: [id number]  Age / Sex: 75 y.o., female  PCP: Redmond School, MD Referring Physician: Barton Dubois, MD  Reason for Consultation: Establishing goals of care  HPI/Patient Profile: 75 y.o. 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    Clinical Assessment and Goals of Care: I met today at Ms. Kravitz's bedside. No family present. Ms. Baucom is confused and difficult to understand at times but is oriented to person and place. She denies pain or discomfort. She gives me permission to call and speak with her daughter.   I called and spoke with daughter, Levada Dy. Levada Dy is planning to come visit with her mother this evening. I updated Levada Dy on her mother's condition. Levada Dy shares that she cares for her mother and up until ~3 weeks ago her mother was able to walk and contribute to her care. However she has become increasingly weak and lethargic. Levada Dy also reports gradual decline in intake. She explains that her mother really only eats one ham biscuit or bowl of oatmeal in a day. We discussed that this could explain her weakness and lethargy as her body is not getting adequate nutrients to thrive. We discussed Ms. Trosper's underlying dementia and likelihood of lung cancer as contributing factors to her decline.   I further discussed with Levada Dy goals for her mother. Levada Dy shares that her mother has been clear in her desire for NO biopsy, work up, or treatment of lung mass. She knows that her mother does not want to suffer. I discussed further with Levada Dy code status and the likelihood that this will cause suffering  to her mother and that I would not expect her mother to survive or benefit from resuscitative efforts. I asked her if she felt her mother would want resuscitative efforts and Levada Dy admits that her mother would not want this - DNR decided. Levada Dy expresses that she and her family want to continue to keep her mother around as long as possible but also recognizing her mother's decline and desire not to suffer.   All questions/concerns addressed. Emotional support provided. Recommend ongoing goals of care conversation.   Primary Decision Maker NEXT OF KIN daughter Levada Dy    SUMMARY OF RECOMMENDATIONS   - DNR decided - No work up of lung mass - Treat the treatable - Ongoing conversations needed with conflicting goals of family wishing to keep her here as long as possible but also wanting to avoid suffering  Code Status/Advance Care Planning: DNR   Symptom Management:  Per attending.   Palliative Prophylaxis:  Aspiration, Bowel Regimen, and Delirium Protocol  Prognosis:  Prognosis poor with failure to thrive. Depending on work up results and response to treatment. Likely weeks to months.   Discharge Planning: To Be Determined  Primary Diagnoses: Present on Admission:  Acute encephalopathy  Essential hypertension  COPD (chronic obstructive pulmonary disease) (HCC)  Microcytic anemia  Acute renal failure superimposed on stage 3 chronic kidney disease (HCC)  Acute metabolic encephalopathy  Herpes zoster ophthalmicus of right eye  Failure to thrive (child)   I have reviewed the medical record, interviewed the patient and family, and examined the patient. The following aspects are pertinent.  Past Medical History:  Diagnosis Date   Alzheimer disease ([place])    Anemia    Anxiety    Arthritis    Asthma    CAD (coronary artery disease)    Cellulitis of buttock, left 2010   Diabetes mellitus    GERD (gastroesophageal reflux disease)    Heart murmur    Hyperlipidemia     Hypertension    MI (myocardial infarction) ([place])    PVD (peripheral vascular disease) ([place])    Wears glasses    Social History   Socioeconomic History   Marital status: Widowed    Spouse name: Not on file   Number of children: 3   Years of education: Not on file   Highest education level: Not on file  Occupational History   Occupation: retired; Freight forwarder [employer]    Employer: RETIRED  Tobacco Use   Smoking status: Every Day    Packs/day: 0.50    Years: 30.00    Pack years: 15.00    Types: Cigarettes    Start date: 11/04/2012   Smokeless tobacco: Never   Tobacco comments:    1-2 cigarettes a day  Vaping Use   Vaping Use: Not on file  Substance and Sexual Activity   Alcohol use: No    Alcohol/week: 0.0 standard drinks   Drug use: No   Sexual activity: Not Currently  Other Topics Concern   Not on file  Social History Narrative   Not on file   Social Determinants of Health   Financial Resource Strain: Not on file  Food Insecurity: Not on file  Transportation Needs: Not on file  Physical Activity: Not on file  Stress: Not on file  Social Connections: Not on file   Family History  Problem Relation Age of Onset   Brain cancer Father    Cancer Father    Diabetes Mother        many family members   Alzheimer's disease Mother    Heart disease Other    Scheduled Meds:  aspirin  81 mg Oral Daily   atorvastatin  20 mg Oral Daily   cholecalciferol  2,000 Units Oral Daily   clopidogrel  75 mg Oral Daily   donepezil  5 mg Oral QHS   dronabinol  5 mg Oral BID AC   escitalopram  10 mg Oral Daily   labetalol  300 mg Oral BID   valACYclovir  1,000 mg Oral Q12H   Continuous Infusions:  sodium chloride 75 mL/hr at 03/23/21 0922   PRN Meds:.acetaminophen **OR** acetaminophen, albuterol, ondansetron **OR** ondansetron (ZOFRAN) IV Allergies  Allergen Reactions   Hydrocodone Hives and Itching   Iohexol Hives        Review of Systems  Unable to perform ROS:  Dementia   Physical Exam Vitals and nursing note reviewed.  Constitutional:      General: She is not in acute distress.    Appearance: She is cachectic. She is ill-appearing.  Cardiovascular:     Rate and Rhythm: Bradycardia present.  Pulmonary:     Effort: No tachypnea,  accessory muscle usage or respiratory distress.  Abdominal:     General: Abdomen is flat.     Palpations: Abdomen is soft.  Neurological:     Mental Status: She is alert.     Comments: Oriented to person and place only    Vital Signs: BP (!) 128/38 (BP Location: Left Arm)   Pulse (!) 49   Temp (!) 97.5 F (36.4 C) (Axillary)   Resp 16   Ht _0  (1.6 m)   Wt 45 kg   SpO2 100%   BMI 17.57 kg/m  Pain Scale: 0-10   Pain Score: 0-No pain   SpO2: SpO2: 100 % O2 Device:SpO2: 100 % O2 Flow Rate: .   IO: Intake/output summary:  Intake/Output Summary (Last 24 hours) at 03/23/2021 1423 Last data filed at 03/23/2021 1141 Gross per 24 hour  Intake 4753.85 ml  Output 400 ml  Net 4353.85 ml    LBM:   Baseline Weight: Weight: 45 kg Most recent weight: Weight: 45 kg     Palliative Assessment/Data:     Time In/Out: 1420-1450, 9980-0123 Time Total: 50 min Greater than 50%  of this time was spent counseling and coordinating care related to the above assessment and plan.  Signed by: Vinie Sill, NP Palliative Medicine Team Pager # [phone] (M-F 8a-5p) Team Phone # [phone] (Nights/Weekends)

## 2021-03-23 NOTE — Progress Notes (Signed)
Patient seen and examined. Admitted after midnight secondary to failure to thrive, dehydration and worsening mental status. Daughter also reported episodes of involuntary arm flaring/jerking movements and post periods of further disorientation. CT head is negating for acute intracranial abnormalities and there is not signs of acute infection. Patient is somnolent on examination, but with stable VS. Please refer to H&P written by Dr. Olevia Bowens on 03/23/2021 for further info/details on admission.  Plan: -will check TSH and B12 -follow EEG and further recommendations by neurology service -Palliative care consultation to clarify [place] and advance care planning; patient is actively declining. -continue IVF's and follow renal function and electrolytes. -continue supportive care, IVF's and follow clinical response.  Barton Dubois MD [phone]

## 2021-03-23 NOTE — ED Notes (Signed)
Pt returned from CT at this time.  

## 2021-03-23 NOTE — ED Notes (Signed)
Pt placed on purewick per request.  Pt provided with multiple warm blankets d/t low core temp. No bear hugger available at this facility.

## 2021-03-23 NOTE — ED Notes (Signed)
Hemoccult done d/t family c/o pt having dark stools. Hemoccult was NEGATIVE. Dr. Sedonia Small made aware.

## 2021-03-23 NOTE — ED Provider Notes (Signed)
[facility] Emergency Department Provider Note MRN:  [id number]  Arrival date & time: 03/23/21     Chief Complaint   Fall   History of Present Illness   Sherri Brooks is a 75 y.o. year-old female with a history of dementia, diabetes, CAD presenting to the ED with chief complaint of fall.  Patient has been weak, not eating or drinking well for the past few days.  Had a fall this evening, daughter called EMS.  Patient is altered and unable to answer questions.  I was unable to obtain an accurate HPI, PMH, or ROS due to the patient's altered mental status.  Level 5 caveat.  Review of Systems  Positive for altered mental status, fall, poor p.o. intake  Patient's Health History    Past Medical History:  Diagnosis Date   Alzheimer disease ([place])    Anemia    Anxiety    Arthritis    Asthma    CAD (coronary artery disease)    Cellulitis of buttock, left 2010   Diabetes mellitus    GERD (gastroesophageal reflux disease)    Heart murmur    Hyperlipidemia    Hypertension    MI (myocardial infarction) ([place])    PVD (peripheral vascular disease) ([place])    Wears glasses     Past Surgical History:  Procedure Laterality Date   ABDOMINAL AORTAGRAM N/A 07/28/2012   Procedure: ABDOMINAL Maxcine Ham;  Surgeon: Angelia Mould, MD;  [facility] CATH LAB;  Service: Cardiovascular;  Laterality: N/A;   ABDOMINAL AORTOGRAM W/LOWER EXTREMITY N/A 06/10/2017   Procedure: ABDOMINAL AORTOGRAM W/LOWER EXTREMITY;  Surgeon: Angelia Mould, MD;  Location: [place] CV LAB;  Service: Cardiovascular;  Laterality: N/A;   ABDOMINAL HYSTERECTOMY     COLONOSCOPY  10/26/2011   Procedure: COLONOSCOPY;  Surgeon: Dorothyann Peng, MD;  Location: [facility]O SUITE;  Service: Endoscopy;  Laterality: N/A;  1:30   CORONARY STENT INTERVENTION N/A 06/14/2017   Procedure: CORONARY STENT INTERVENTION;  Surgeon: Leonie Man, MD;  Location: [place] CV LAB;  Service: Cardiovascular;   Laterality: N/A;   FEMORAL-POPLITEAL BYPASS GRAFT  07/2010   pt has had 3 fem-pop bpg   FEMORAL-POPLITEAL BYPASS GRAFT  11/04/2012   Procedure: BYPASS GRAFT FEMORAL-POPLITEAL ARTERY;  Surgeon: Angelia Mould, MD;  Location: [facility] OR;  Service: Vascular;  Laterality: Left;  Redo Left Femoral Popliteal Bypass with PTFE   FOOT AMPUTATION  2010   left   LEFT HEART CATH AND CORONARY ANGIOGRAPHY N/A 06/14/2017   Procedure: LEFT HEART CATH AND CORONARY ANGIOGRAPHY;  Surgeon: Leonie Man, MD;  Location: [place] CV LAB;  Service: Cardiovascular;  Laterality: N/A;   LOWER EXTREMITY ANGIOGRAM Bilateral 07/28/2012   Procedure: LOWER EXTREMITY ANGIOGRAM;  Surgeon: Angelia Mould, MD;  Location[facility] CATH LAB;  Service: Cardiovascular;  Laterality: Bilateral;   LOWER EXTREMITY ANGIOGRAM Left 01/02/2016   Procedure: Lower Extremity Angiogram;  Surgeon: Angelia Mould, MD;  Location: [place] CV LAB;  Service: Cardiovascular;  Laterality: Left;   LOWER EXTREMITY ANGIOGRAM Left 07/03/2017   Procedure: Lower Extremity Angiogram;  Surgeon: Waynetta Sandy, MD;  Location: [place] CV LAB;  Service: Cardiovascular;  Laterality: Left;   LOWER EXTREMITY INTERVENTION Left 07/03/2017   Procedure: Lower Extremity Intervention;  Surgeon: Waynetta Sandy, MD;  Location: [place] CV LAB;  Service: Cardiovascular;  Laterality: Left;   ORIF ANKLE FRACTURE Left 01/27/2013   Procedure: OPEN REDUCTION INTERNAL FIXATION (ORIF) ANKLE FRACTURE;  Surgeon:  Wylene Simmer, MD;  Location: [place];  Service: Orthopedics;  Laterality: Left;   PERCUTANEOUS STENT INTERVENTION Left 07/28/2012   Procedure: PERCUTANEOUS STENT INTERVENTION;  Surgeon: Angelia Mould, MD;  Location: [facility] CATH LAB;  Service: Cardiovascular;  Laterality: Left;  lt ext tiliac stentx1   PERIPHERAL VASCULAR BALLOON ANGIOPLASTY Left 06/10/2017   Procedure: PERIPHERAL VASCULAR BALLOON ANGIOPLASTY;   Surgeon: Angelia Mould, MD;  Location: [place] CV LAB;  Service: Cardiovascular;  Laterality: Left;  external iliac   PERIPHERAL VASCULAR CATHETERIZATION N/A 01/02/2016   Procedure: Abdominal Aortogram;  Surgeon: Angelia Mould, MD;  Location: [place] CV LAB;  Service: Cardiovascular;  Laterality: N/A;   PERIPHERAL VASCULAR CATHETERIZATION Left 01/02/2016   Procedure: Peripheral Vascular Intervention;  Surgeon: Angelia Mould, MD;  Location: [place] CV LAB;  Service: Cardiovascular;  Laterality: Left;  lt ext iliac   right common iliac stent  10/2009   Dr Doren Custard ([place])   S/P Hysterecotmy     partial   TONSILLECTOMY     VASCULAR SURGERY      Family History  Problem Relation Age of Onset   Brain cancer Father    Cancer Father    Diabetes Mother        many family members   Alzheimer's disease Mother    Heart disease Other     Social History   Socioeconomic History   Marital status: Widowed    Spouse name: Not on file   Number of children: 3   Years of education: Not on file   Highest education level: Not on file  Occupational History   Occupation: retired; Freight forwarder [employer]    Employer: RETIRED  Tobacco Use   Smoking status: Every Day    Packs/day: 0.50    Years: 30.00    Pack years: 15.00    Types: Cigarettes    Start date: 11/04/2012   Smokeless tobacco: Never   Tobacco comments:    1-2 cigarettes a day  Vaping Use   Vaping Use: Not on file  Substance and Sexual Activity   Alcohol use: No    Alcohol/week: 0.0 standard drinks   Drug use: No   Sexual activity: Not Currently  Other Topics Concern   Not on file  Social History Narrative   Not on file   Social Determinants of Health   Financial Resource Strain: Not on file  Food Insecurity: Not on file  Transportation Needs: Not on file  Physical Activity: Not on file  Stress: Not on file  Social Connections: Not on file  Intimate Partner Violence: Not on file     Physical  Exam   Vitals:   03/23/21 0300 03/23/21 0430  BP: (!) 163/59 (!) 137/53  Pulse: (!) 51 (!) 57  Resp: 18 19  Temp:  (!) 96 F (35.6 C)  SpO2: 99% 94%    CONSTITUTIONAL: Chronically ill-appearing, NAD NEURO: Confused, unable to answer questions, can move all extremities EYES:  eyes equal and reactive ENT/NECK:  no LAD, no JVD CARDIO: Regular rate, well-perfused, normal S1 and S2 PULM:  CTAB no wheezing or rhonchi GI/GU:  normal bowel sounds, non-distended, non-tender MSK/SPINE:  No gross deformities, no edema SKIN:  no rash, atraumatic PSYCH:  Appropriate speech and behavior  *Additional and/or pertinent findings included in MDM below  Diagnostic and Interventional Summary    EKG Interpretation  Date/Time:  Thursday March 23 2021 02:55:24 EDT Ventricular Rate:  51 PR Interval:  QRS Duration: 120 QT Interval:  492 QTC Calculation: 010 R Axis:   -11 Text Interpretation: Junctional rhythm LVH with secondary repolarization abnormality Confirmed by Gerlene Fee [phone]) on 03/23/2021 4:11:27 AM        Labs Reviewed  CBC WITH DIFFERENTIAL/PLATELET - Abnormal; Notable for the following components:      Result Value   RBC 3.17 (*)    Hemoglobin 7.9 (*)    HCT 25.3 (*)    MCV 79.8 (*)    MCH 24.9 (*)    RDW 16.0 (*)    All other components within normal limits  COMPREHENSIVE METABOLIC PANEL - Abnormal; Notable for the following components:   Glucose, Bld 101 (*)    Creatinine, Ser 1.94 (*)    Calcium 8.8 (*)    Albumin 2.5 (*)    GFR, Estimated 26 (*)    All other components within normal limits  RESP PANEL BY RT-PCR (FLU A&B, COVID) [id number]  PROTIME-INR  URINALYSIS, ROUTINE W REFLEX MICROSCOPIC  POC OCCULT BLOOD, ED  TROPONIN I (HIGH SENSITIVITY)  TROPONIN I (HIGH SENSITIVITY)    DG Chest 1 View  Final Result    CT HEAD WO CONTRAST  Final Result      Medications  acetaminophen (TYLENOL) tablet 650 mg (has no administration in time range)    Or   acetaminophen (TYLENOL) suppository 650 mg (has no administration in time range)  ondansetron (ZOFRAN) tablet 4 mg (has no administration in time range)    Or  ondansetron (ZOFRAN) injection 4 mg (has no administration in time range)  0.9 %  sodium chloride infusion (has no administration in time range)  sodium chloride 0.9 % bolus 1,000 mL (1,000 mLs Intravenous New Bag/Given 03/23/21 0457)     Procedures  /  Critical Care Procedures  ED Course and Medical Decision Making  I have reviewed the triage vital signs, the nursing notes, and pertinent available records from the EMR.  Listed above are laboratory and imaging tests that I personally ordered, reviewed, and interpreted and then considered in my medical decision making (see below for details).  Fall, altered mental status, recent poor p.o. intake, considering metabolic disarray, UTI, intracranial bleeding or mass, work-up is pending.     Labs reveal mild AKI.  Patient remains altered.  Will admit for further hydration and management of unexplained encephalopathy.  Barth Kirks. Sedonia Small, [place] [email or web address]  Final Clinical Impressions(s) / ED Diagnoses     ICD-10-CM   1. AKI (acute kidney injury) ([place])  N17.9     2. Confusion  R41.0 CT HEAD WO CONTRAST    CT HEAD WO CONTRAST    DG Chest 1 View    DG Chest 1 View    3. Altered mental status, unspecified altered mental status type  R41.82       ED Discharge Orders     None        Discharge Instructions Discussed with and Provided to Patient:   Discharge Instructions   None       Maudie Flakes, MD 03/23/21 [phone]

## 2021-03-24 ENCOUNTER — Observation Stay (HOSPITAL_COMMUNITY)
Admission: EM | Admit: 2021-03-24 | Discharge: 2021-03-24 | Disposition: A | Discharge status: Home / Self Care | Payer: Medicare HMO | Source: Home / Self Care | Attending: Internal Medicine | Admitting: Internal Medicine

## 2021-03-24 DIAGNOSIS — E43 Unspecified severe protein-calorie malnutrition: Secondary | ICD-10-CM | POA: Diagnosis present

## 2021-03-24 DIAGNOSIS — G9341 Metabolic encephalopathy: Principal | ICD-10-CM

## 2021-03-24 DIAGNOSIS — N1832 Chronic kidney disease, stage 3b: Secondary | ICD-10-CM

## 2021-03-24 DIAGNOSIS — R627 Adult failure to thrive: Secondary | ICD-10-CM | POA: Diagnosis not present

## 2021-03-24 DIAGNOSIS — F17210 Nicotine dependence, cigarettes, uncomplicated: Secondary | ICD-10-CM | POA: Diagnosis present

## 2021-03-24 DIAGNOSIS — J449 Chronic obstructive pulmonary disease, unspecified: Secondary | ICD-10-CM | POA: Diagnosis present

## 2021-03-24 DIAGNOSIS — I252 Old myocardial infarction: Secondary | ICD-10-CM | POA: Diagnosis not present

## 2021-03-24 DIAGNOSIS — Z515 Encounter for palliative care: Secondary | ICD-10-CM | POA: Diagnosis not present

## 2021-03-24 DIAGNOSIS — F419 Anxiety disorder, unspecified: Secondary | ICD-10-CM | POA: Diagnosis present

## 2021-03-24 DIAGNOSIS — I2510 Atherosclerotic heart disease of native coronary artery without angina pectoris: Secondary | ICD-10-CM | POA: Diagnosis present

## 2021-03-24 DIAGNOSIS — R410 Disorientation, unspecified: Secondary | ICD-10-CM | POA: Diagnosis present

## 2021-03-24 DIAGNOSIS — K219 Gastro-esophageal reflux disease without esophagitis: Secondary | ICD-10-CM | POA: Diagnosis present

## 2021-03-24 DIAGNOSIS — I10 Essential (primary) hypertension: Secondary | ICD-10-CM | POA: Diagnosis not present

## 2021-03-24 DIAGNOSIS — E1122 Type 2 diabetes mellitus with diabetic chronic kidney disease: Secondary | ICD-10-CM | POA: Diagnosis present

## 2021-03-24 DIAGNOSIS — E860 Dehydration: Secondary | ICD-10-CM | POA: Diagnosis present

## 2021-03-24 DIAGNOSIS — N179 Acute kidney failure, unspecified: Secondary | ICD-10-CM | POA: Diagnosis present

## 2021-03-24 DIAGNOSIS — F0280 Dementia in other diseases classified elsewhere without behavioral disturbance: Secondary | ICD-10-CM | POA: Diagnosis present

## 2021-03-24 DIAGNOSIS — Z20822 Contact with and (suspected) exposure to covid-19: Secondary | ICD-10-CM | POA: Diagnosis present

## 2021-03-24 DIAGNOSIS — B0230 Zoster ocular disease, unspecified: Secondary | ICD-10-CM

## 2021-03-24 DIAGNOSIS — I129 Hypertensive chronic kidney disease with stage 1 through stage 4 chronic kidney disease, or unspecified chronic kidney disease: Secondary | ICD-10-CM | POA: Diagnosis present

## 2021-03-24 DIAGNOSIS — Z66 Do not resuscitate: Secondary | ICD-10-CM | POA: Diagnosis present

## 2021-03-24 DIAGNOSIS — J984 Other disorders of lung: Secondary | ICD-10-CM | POA: Diagnosis not present

## 2021-03-24 DIAGNOSIS — E1151 Type 2 diabetes mellitus with diabetic peripheral angiopathy without gangrene: Secondary | ICD-10-CM | POA: Diagnosis present

## 2021-03-24 DIAGNOSIS — D638 Anemia in other chronic diseases classified elsewhere: Secondary | ICD-10-CM | POA: Diagnosis present

## 2021-03-24 DIAGNOSIS — R64 Cachexia: Secondary | ICD-10-CM | POA: Diagnosis present

## 2021-03-24 DIAGNOSIS — W19XXXA Unspecified fall, initial encounter: Secondary | ICD-10-CM | POA: Diagnosis present

## 2021-03-24 DIAGNOSIS — Z681 Body mass index (BMI) 19 or less, adult: Secondary | ICD-10-CM | POA: Diagnosis not present

## 2021-03-24 DIAGNOSIS — E785 Hyperlipidemia, unspecified: Secondary | ICD-10-CM | POA: Diagnosis present

## 2021-03-24 DIAGNOSIS — G309 Alzheimer's disease, unspecified: Secondary | ICD-10-CM | POA: Diagnosis present

## 2021-03-24 LAB — COMPREHENSIVE METABOLIC PANEL
ALT: 13 U/L (ref 0–44)
AST: 24 U/L (ref 15–41)
Albumin: 2.5 g/dL — ABNORMAL LOW (ref 3.5–5.0)
Alkaline Phosphatase: 90 U/L (ref 38–126)
Anion gap: 5 (ref 5–15)
BUN: 14 mg/dL (ref 8–23)
CO2: 25 mmol/L (ref 22–32)
Calcium: 9.1 mg/dL (ref 8.9–10.3)
Chloride: 107 mmol/L (ref 98–111)
Creatinine, Ser: 1.4 mg/dL — ABNORMAL HIGH (ref 0.44–1.00)
GFR, Estimated: 39 mL/min — ABNORMAL LOW (ref >60)
Glucose, Bld: 79 mg/dL (ref 70–99)
Potassium: 3.8 mmol/L (ref 3.5–5.1)
Sodium: 137 mmol/L (ref 135–145)
Total Bilirubin: 0.7 mg/dL (ref 0.3–1.2)
Total Protein: 7.6 g/dL (ref 6.5–8.1)

## 2021-03-24 LAB — CBC
HCT: 24.8 % — ABNORMAL LOW (ref 36.0–46.0)
Hemoglobin: 7.8 g/dL — ABNORMAL LOW (ref 12.0–15.0)
MCH: 25 pg — ABNORMAL LOW (ref 26.0–34.0)
MCHC: 31.5 g/dL (ref 30.0–36.0)
MCV: 79.5 fL — ABNORMAL LOW (ref 80.0–100.0)
Platelets: 385 10*3/uL (ref 150–400)
RBC: 3.12 MIL/uL — ABNORMAL LOW (ref 3.87–5.11)
RDW: 16.2 % — ABNORMAL HIGH (ref 11.5–15.5)
WBC: 6.5 10*3/uL (ref 4.0–10.5)
nRBC: 0 % (ref 0.0–0.2)

## 2021-03-24 LAB — GLUCOSE, CAPILLARY
Glucose-Capillary: 91 mg/dL (ref 70–99)
Glucose-Capillary: 72 mg/dL (ref 70–99)
Glucose-Capillary: 102 mg/dL — ABNORMAL HIGH (ref 70–99)
Glucose-Capillary: 121 mg/dL — ABNORMAL HIGH (ref 70–99)
Glucose-Capillary: 118 mg/dL — ABNORMAL HIGH (ref 70–99)

## 2021-03-24 MED ORDER — ADULT MULTIVITAMIN W/MINERALS CH
1.0000 | ORAL_TABLET | Freq: Every day | ORAL | Status: DC
  Administered 2021-03-24 – 2021-03-26 (×3): 1 via ORAL
  Filled 2021-03-24 (×3): qty 1

## 2021-03-24 MED ORDER — SODIUM CHLORIDE 0.9 % IV SOLN: INTRAVENOUS | Status: AC

## 2021-03-24 NOTE — Progress Notes (Signed)
PROGRESS NOTE    Sherri Brooks  GYI:[id number] DOB: April 09, 1946 DOA: 03/23/2021 PCP: [employer], MD   Chief Complaint  Patient presents with   Fall    Brief admission Narrative:  As per H&P written by Dr. Olevia Bowens on 03/23/21 Sherri Brooks is a 75 y.o. female with medical history significant of Alzheimer's disease, anemia, anxiety, osteoarthritis, asthma, CAD, history of MI, cellulitis of left buttock, type II DM, GERD, history of heart murmur, hyperlipidemia, hypertension, PVD, COPD, active smoker, left upper lobe mass who is brought to the emergency department by her daughter due to progressively worse decline in mental status of several weeks, but particularly more pronounced since yesterday around noontime when the patient had 2 episodes of "arm flaring or jerking" within 60 to 90 minutes apart followed by periods of disorientation.  Her appetite has been decreased over the past year.  She has lost 40 to 50 pounds.  She declined to proceed with diagnosis and treatment of the mass after she declined diagnostic procedures offered by pulmonology.  No fever, nausea, vomiting or diarrhea.   ED Course: Initial vital signs were temperature 94.7 F, pulse 50, respirations 16, BP 133/53 mmHg O2 sat 100% on room air.  After 2 hours, the temperature increased to 8 F with warming measures.  She was given a 1000 mL LR bolus.   Lab work: Her CBC showed a white count 5.7, hemoglobin 7.9 g/dL platelets 358.  Normal PT and INR.  First troponin was negative.  CMP showed normal electrolytes when calcium is corrected to albumin.  Glucose 101 and creatinine 1.94 mg/dL.  Albumin was 2.5 g/dL.  The rest of the CMP results were unremarkable.     Imaging: Chest radiograph shows significant enlargement of LUL mass.  Multiple chronic findings on head CT, but no acute intracranial pathology.  Please see images and full detailed radiology report.    Assessment & Plan: 1-acute metabolic encephalopathy -In the setting  of dehydration, electrolyte abnormalities most likely further progression of underlying dementia. -no seizures appreciated -significant improvement after IVF's and electrolytes repletion -normal B12 and TSH -will ocntinue supportive care, constant reorientation and follow response  2-Type 2 diabetes mellitus (HCC) -Stable CBG's and has been less than 200 -Holding on any medication therapy currently -Follow A1c.  3-Essential hypertension -Continue current antihypertensive regimen. -Currently stable; follow vital signs.  4-Cavitating mass in left upper lung lobe -Demonstrating follicles and ribs erosion -patient has declined further evaluation or treatment of what appears to be lung cancer.  5-COPD (chronic obstructive pulmonary disease) (HCC) -no Wheezing -Good oxygen saturation. -Continue as needed bronchodilators.  6-hyperlipidemia -Continue Lipitor.  7-prior history of stroke -Continue secondary prevention using Plavix, aspirin -Continue Repatha medication.  8-failure to thrive/severe protein calorie malnutrition -Appreciate recommendations by nutritional service -Continue Marinol to assist with appetite -Continue feeding supplements twice a day. -Patient has been encouraged to maintain adequate oral intake  9-acute kidney injury on chronic kidney disease (stage IIIb -In the setting of decreased oral intake and prerenal azotemia -Adequately improvement resuscitation -Continue to follow renal function trend.  10-Herpes zoster ophthalmicus of right eye -Continue treatment with Valtrex.    DVT prophylaxis: SCDs Code Status: DNR Family Communication: No family at bedside.  Unable to reach daughter over the phone.  EEG Disposition:   Status is: Inpatient  The patient will require care spanning > 2 midnights and should be moved to inpatient because: IV treatments appropriate due to intensity of illness or inability to take PO  Dispo:  The patient is from: Home               Anticipated d/c is to: Home              Patient currently no medically stable for discharge; but improving and hopefully ability/in the next 24-48 hours.  Still demonstrating signs of acute kidney injury on her blood work and receiving IV fluid resuscitation.  Appetite is still poor.   Difficult to place patient No       Consultants:  Palliative care  Procedures:  See below for x-ray report  Antimicrobials/antiviral:  Valtrex   Subjective: Feeling better; following commands appropriately.  No chest pain, no nausea, currently afebrile.  Objective: Vitals:   03/23/21 2209 03/24/21 0500 03/24/21 0700 03/24/21 1358  BP: (!) 175/65 (!) 158/54  (!) 129/52  Pulse: 61 (!) 58  61  Resp: 18 16  16   Temp: 98.2 F (36.8 C) 98.6 F (37 C)  98.7 F (37.1 C)  TempSrc:    Oral  SpO2: 100% 100%  100%  Weight:   41 kg   Height:        Intake/Output Summary (Last 24 hours) at 03/24/2021 1642 Last data filed at 03/24/2021 1359 Gross per 24 hour  Intake 1468.34 ml  Output --  Net 1468.34 ml   Filed Weights   03/23/21 0237 03/24/21 0700  Weight: 45 kg 41 kg    Examination:  General exam: Significantly underweight, chronically ill in appearance; reports no chest pain, no nausea or vomiting.  Overall feeling better, cooperative with examination and answering questions appropriately. Respiratory system: Clear to auscultation. Respiratory effort normal.  No using accessory muscles; good saturation on room air. Cardiovascular system: S1 & S2 heard, RRR. No JVD, murmurs, rubs, gallops or clicks. No pedal edema. Gastrointestinal system: Abdomen is nondistended, soft and nontender. No organomegaly or masses felt. Normal bowel sounds heard. Central nervous system: Alert and oriented. No focal neurological deficits. Extremities: No cyanosis or clubbing. Skin: No petechiae; positive herpetic lesions in the right sided facial dermatomes. Psychiatry: Mood & affect appropriate.      Data Reviewed: I have personally reviewed following labs and imaging studies  CBC: Recent Labs  Lab 03/23/21 0235 03/24/21 0701  WBC 5.7 6.5  NEUTROABS 3.5  --   HGB 7.9* 7.8*  HCT 25.3* 24.8*  MCV 79.8* 79.5*  PLT 358 726    Basic Metabolic Panel: Recent Labs  Lab 03/23/21 0235 03/24/21 0701  NA 135 137  K 4.0 3.8  CL 103 107  CO2 24 25  GLUCOSE 101* 79  BUN 20 14  CREATININE 1.94* 1.40*  CALCIUM 8.8* 9.1    GFR: Estimated Creatinine Clearance: 22.1 mL/min (A) (by C-G formula based on SCr of 1.4 mg/dL (H)).  Liver Function Tests: Recent Labs  Lab 03/23/21 0235 03/24/21 0701  AST 20 24  ALT 12 13  ALKPHOS 82 90  BILITOT 0.8 0.7  PROT 7.8 7.6  ALBUMIN 2.5* 2.5*    CBG: Recent Labs  Lab 03/23/21 1705 03/23/21 2213 03/24/21 0753 03/24/21 1152 03/24/21 1622  GLUCAP 91 105* 72 102* 121*     Recent Results (from the past 240 hour(s))  Resp Panel by RT-PCR (Flu A&B, Covid) Nasopharyngeal Swab     Status: None   Collection Time: 03/23/21  4:46 AM   Specimen: Nasopharyngeal Swab; Nasopharyngeal(NP) swabs in vial transport medium  Result Value Ref Range Status   SARS Coronavirus 2 by RT PCR NEGATIVE NEGATIVE  Final    Comment: (NOTE) SARS-CoV-2 target nucleic acids are NOT DETECTED.  The SARS-CoV-2 RNA is generally detectable in upper respiratory specimens during the acute phase of infection. The lowest concentration of SARS-CoV-2 viral copies this assay can detect is 138 copies/mL. A negative result does not preclude SARS-Cov-2 infection and should not be used as the sole basis for treatment or other patient management decisions. A negative result may occur with  improper specimen collection/handling, submission of specimen other than nasopharyngeal swab, presence of viral mutation(s) within the areas targeted by this assay, and inadequate number of viral copies(<138 copies/mL). A negative result must be combined with clinical observations,  patient history, and epidemiological information. The expected result is Negative.  Fact Sheet for Patients:  EntrepreneurPulse.com.au  Fact Sheet for Healthcare Providers:  IncredibleEmployment.be  This test is no t yet approved or cleared by the [place] FDA and  has been authorized for detection and/or diagnosis of SARS-CoV-2 by FDA under an Emergency Use Authorization (EUA). This EUA will remain  in effect (meaning this test can be used) for the duration of the COVID-19 declaration under Section 564(b)(1) of the Act, 21 U.S.C.section 360bbb-3(b)(1), unless the authorization is terminated  or revoked sooner.       Influenza A by PCR NEGATIVE NEGATIVE Final   Influenza B by PCR NEGATIVE NEGATIVE Final    Comment: (NOTE) The Xpert Xpress SARS-CoV-2/FLU/RSV plus assay is intended as an aid in the diagnosis of influenza from Nasopharyngeal swab specimens and should not be used as a sole basis for treatment. Nasal washings and aspirates are unacceptable for Xpert Xpress SARS-CoV-2/FLU/RSV testing.  Fact Sheet for Patients: EntrepreneurPulse.com.au  Fact Sheet for Healthcare Providers: IncredibleEmployment.be  This test is not yet approved or cleared by the [place] FDA and has been authorized for detection and/or diagnosis of SARS-CoV-2 by FDA under an Emergency Use Authorization (EUA). This EUA will remain in effect (meaning this test can be used) for the duration of the COVID-19 declaration under Section 564(b)(1) of the Act, 21 U.S.C. section 360bbb-3(b)(1), unless the authorization is terminated or revoked.  Performed at [facility], [address] [place], [place] [address]      Radiology Studies: DG Chest 1 View  Result Date: 03/23/2021 CLINICAL DATA:  Altered mental status EXAM: CHEST  1 VIEW COMPARISON:  09/10/2020 FINDINGS: Left apical mass has significantly enlarged in the interval  measuring 6.9 x 5.9 cm, in keeping with a enlarging primary neoplasm. Right lung is clear. No pneumothorax or pleural effusion. Cardiac size within normal limits. Pulmonary vascularity is normal. The anterior left first rib appears eroded. IMPRESSION: Progressive enlargement of left apical mass now with left first rib erosion. Electronically Signed   By: Fidela Salisbury MD   On: 03/23/2021 03:26   CT HEAD WO CONTRAST  Result Date: 03/23/2021 CLINICAL DATA:  Confusion EXAM: CT HEAD WITHOUT CONTRAST TECHNIQUE: Contiguous axial images were obtained from the base of the skull through the vertex without intravenous contrast. COMPARISON:  CT head 03/15/2021, PT CT 03/07/20, MRI head 01/21/2020 FINDINGS: Brain: Cerebral ventricle sizes are concordant with the degree of cerebral volume loss. Patchy and confluent areas of decreased attenuation are noted throughout the deep and periventricular white matter of the cerebral hemispheres bilaterally, compatible with chronic microvascular ischemic disease. No evidence of large-territorial acute infarction. No parenchymal hemorrhage. No mass lesion. No extra-axial collection. No mass effect or midline shift. No hydrocephalus. Basilar cisterns are patent. Vascular: No hyperdense vessel. Atherosclerotic calcifications are present  within the cavernous internal carotid arteries. Skull: No acute fracture or focal lesion. Sinuses/Orbits: Persistent bilateral partial opacification of mastoid air cells, right greater than left. No fluid within the middle ears. Almost complete opacification of bilateral ethmoid sinuses. Mucosal thickening of the right sphenoid and left frontal sinus. Complete opacification of the right frontal sinus. The orbits are unremarkable. Other: None. IMPRESSION: 1. No acute intracranial abnormality. 2. Pansinusitis with partial opacification of bilateral mastoid air cells. Electronically Signed   By: Iven Finn M.D.   On: 03/23/2021 03:35     Scheduled  Meds:  aspirin  81 mg Oral Daily   atorvastatin  20 mg Oral Daily   cholecalciferol  2,000 Units Oral Daily   clopidogrel  75 mg Oral Daily   donepezil  5 mg Oral QHS   dronabinol  5 mg Oral BID AC   escitalopram  10 mg Oral Daily   feeding supplement  237 mL Oral BID BM   labetalol  300 mg Oral BID   multivitamin with minerals  1 tablet Oral Daily   valACYclovir  1,000 mg Oral Q12H   Continuous Infusions:  sodium chloride       LOS: 0 days    Time spent: 30 minutes.    Barton Dubois, MD [facility]   To contact the attending provider between 7A-7P or the covering provider during after hours 7P-7A, please log into the web site [email or web address] and access using universal  password for that web site. If you do not have the password, please call the hospital operator.  03/24/2021, 4:42 PM

## 2021-03-24 NOTE — Procedures (Signed)
Patient Name: Sherri Brooks  MRN: [id number]  Epilepsy Attending: Lora Havens  Referring Physician/Provider: Dr. Tennis Must Date: 03/24/2021 Duration: 25.26 mins  Patient history: 75 year old female with altered mental status.  EEG to evaluate seizures.  Level of alertness: Awake  AEDs during EEG study: None  Technical aspects: This EEG study was done with scalp electrodes positioned according to the 10-20 International system of electrode placement. Electrical activity was acquired at a sampling rate of 500Hz  and reviewed with a high frequency filter of 70Hz  and a low frequency filter of 1Hz . EEG data were recorded continuously and digitally stored.   Description: The posterior dominant rhythm consists of 8-9 Hz activity of moderate voltage (25-35 uV) seen predominantly in posterior head regions, symmetric and reactive to eye opening and eye closing. Hyperventilation and photic stimulation were not performed.     IMPRESSION: This study is within normal limits. No seizures or epileptiform discharges were seen throughout the recording.   Ottilie Wigglesworth Barbra Sarks

## 2021-03-24 NOTE — Progress Notes (Signed)
Initial Nutrition Assessment  DOCUMENTATION CODES:   Severe malnutrition in context of chronic illness  INTERVENTION:  Ensure Enlive po BID, each supplement provides 350 kcal and 20 grams of protein   Recommend least restrictive diet due to her malnourished state  Magic cup with lunch and dinner meals, each supplement provides 290 kcal and 9 grams of protein   Multivitamin daily  NUTRITION DIAGNOSIS:   Severe Malnutrition related to chronic illness (cavitary lung mass- LUL) as evidenced by energy intake < or equal to 75% for > or equal to 1 month, severe fat depletion, severe muscle depletion, percent weight loss.   GOAL:  Patient will meet greater than or equal to 90% of their needs   MONITOR:  PO intake, Supplement acceptance, Labs, Weight trends, Skin  REASON FOR ASSESSMENT:   Malnutrition Screening Tool    ASSESSMENT: Patient is an underweight 75 yo female who has cavitary lung mass, dementia, diabetes type 2, anemia, CAD, COPD. Poor appetite and wt loss over the past year. Presents with altered mental status and decline the past several weeks.  Patient ate only 2 oz of ice cream this morning but 100% at lunch today. Taking a medication for appetite. Patient likes Ensure and says she drinks them at home? Unsure if that is accurate given her dementia history. May require feeding assistance.   Patient weight is down 13.4 kg (29 lb) the past 9 months based on hospital records. Reflects a loss of 24% for timeframe which is clinically significant.  Severe loss of muscle and fat depletions overall. Meets criteria for severe malnutrition -chronic catabolic illness.    Medications reviewed and include: Vitamin D3, Marinol, Lipitor, Aricept.  Labs: BMP Latest Ref Rng & Units 03/24/2021 03/23/2021 03/15/2021  Glucose 70 - 99 mg/dL 79 101(H) 195(H)  BUN 8 - 23 mg/dL 14 20 15   Creatinine 0.44 - 1.00 mg/dL 1.40(H) 1.94(H) 1.58(H)  BUN/Creat Ratio 12 - 28 - - -  Sodium 135 - 145 mmol/L  137 135 133(L)  Potassium 3.5 - 5.1 mmol/L 3.8 4.0 2.9(L)  Chloride 98 - 111 mmol/L 107 103 94(L)  CO2 22 - 32 mmol/L 25 24 28   Calcium 8.9 - 10.3 mg/dL 9.1 8.8(L) 8.4(L)      NUTRITION - FOCUSED PHYSICAL EXAM:  Flowsheet Row Most Recent Value  Orbital Region Moderate depletion  Upper Arm Region Severe depletion  Thoracic and Lumbar Region Severe depletion  Buccal Region Severe depletion  Temple Region Moderate depletion  Clavicle Bone Region Severe depletion  Clavicle and Acromion Bone Region Severe depletion  Dorsal Hand Severe depletion  Patellar Region Severe depletion  Anterior Thigh Region Severe depletion  Posterior Calf Region Severe depletion  Edema (RD Assessment) Mild  Hair Reviewed  Eyes Reviewed  Mouth Reviewed  Skin Reviewed  Nails Reviewed       Diet Order:   Diet Order             Diet full liquid Room service appropriate? Yes; Fluid consistency: Thin  Diet effective now                   EDUCATION NEEDS:  Not appropriate for education at this time  Skin:  Skin Assessment: Reviewed RN Assessment  Last BM:  pta  Height:   Ht Readings from Last 1 Encounters:  03/23/21 5\' 3"  (1.6 m)    Weight:   Wt Readings from Last 1 Encounters:  03/24/21 41 kg    Ideal Body Weight:  52 kg  BMI:  Body mass index is 16.01 kg/m.  Estimated Nutritional Needs:   Kcal:  1435-1640  Protein:  62-74 gr  Fluid:  >1200 ml daily   Colman Cater MS,RD,CSG,LDN Contact: Shea Evans

## 2021-03-24 NOTE — Progress Notes (Signed)
EEG complete - results pending 

## 2021-03-25 LAB — GLUCOSE, CAPILLARY
Glucose-Capillary: 88 mg/dL (ref 70–99)
Glucose-Capillary: 91 mg/dL (ref 70–99)
Glucose-Capillary: 123 mg/dL — ABNORMAL HIGH (ref 70–99)
Glucose-Capillary: 134 mg/dL — ABNORMAL HIGH (ref 70–99)

## 2021-03-25 LAB — HEMOGLOBIN A1C
Hgb A1c MFr Bld: 7.2 % — ABNORMAL HIGH (ref 4.8–5.6)
Mean Plasma Glucose: 160 mg/dL

## 2021-03-25 LAB — BASIC METABOLIC PANEL
Anion gap: 7 (ref 5–15)
BUN: 15 mg/dL (ref 8–23)
CO2: 24 mmol/L (ref 22–32)
Calcium: 9.3 mg/dL (ref 8.9–10.3)
Chloride: 103 mmol/L (ref 98–111)
Creatinine, Ser: 1.23 mg/dL — ABNORMAL HIGH (ref 0.44–1.00)
GFR, Estimated: 46 mL/min — ABNORMAL LOW (ref >60)
Glucose, Bld: 81 mg/dL (ref 70–99)
Potassium: 3.8 mmol/L (ref 3.5–5.1)
Sodium: 134 mmol/L — ABNORMAL LOW (ref 135–145)

## 2021-03-25 NOTE — Progress Notes (Addendum)
PROGRESS NOTE  Sherri Brooks GXQ:[id number] DOB: 1946/07/30 DOA: 03/23/2021 PCP: Redmond School, MD  HPI/Recap of past 24 hours: Patient seen and examined at bedside.  Patient denied any complaints she ate her food well.  She is in good spirit.  Stated that her daughter just left to go home  Assessment/Plan: Principal Problem:   Acute metabolic encephalopathy Active Problems:   Type 2 diabetes mellitus (HCC)   Essential hypertension   Cavitating mass in left upper lung lobe   COPD (chronic obstructive pulmonary disease) (HCC)   Acute encephalopathy   Microcytic anemia   Acute renal failure superimposed on stage 3 chronic kidney disease (HCC)   Herpes zoster ophthalmicus of right eye   Failure to thrive (child)   Protein-calorie malnutrition, severe   Failure to thrive in adult  #1 acute metabolic encephalopathy with underlying dementia exacerbated by dehydration and electrolyte imbalance patient has been rehydrated we will continue supportive care and repletion of electrolytes Possible seizure. EEG has been done and was read as normal there was no seizure or epileptiform discharge seen throughout the recording.  2.  Type 2 diabetes mellitus stable.  Have medications that being held because her blood sugars have been less than 200 continue monitoring and sliding scale if needed.  Her hemoglobin A1c is 7.2.  Her blood sugars are running mostly around 134 this  3.  Essential hypertension continue current antihypertensive regimen  4.  Cavitating mass in the left upper lobe demonstrating follicle and repeat erosion patient has declined further evaluation or treatment of what appears to be lung cancer  5.  COPD no current wheezing patient with good oxygenation not requiring any supplemental oxygen  6.  Herpes zoster ophthalmicus of the right eye improving Continue Valtrex  7.  Failure to thrive most likely from malignant lung disease patient is not agreeable to treatment for the lung  disease.  We will continue supportive care.  Nurse noted appetite is poor continue Ensure supplement  8.  Acute kidney injury on chronic kidney disease stage III her current creatinine is 1.23.  Continue hydration  9.  Anemia of chronic disease continue to monitor  Code Status: DNR    Family Communication: None at bedside  Disposition Plan: Likely home   Consultants: Neurology Palliative care consult  Procedures: None  Antimicrobials: Antiviral  DVT prophylaxis: SCD   Objective: Vitals:   03/24/21 1358 03/24/21 2045 03/25/21 1137 03/25/21 1436  BP: (!) 129/52 (!) 180/60 (!) 185/67 (!) 144/59  Pulse: 61 61 66 (!) 57  Resp: 16 20  16   Temp: 98.7 F (37.1 C) 98.2 F (36.8 C)  97.8 F (36.6 C)  TempSrc: Oral Oral  Oral  SpO2: 100% 100%  99%  Weight:      Height:        Intake/Output Summary (Last 24 hours) at 03/25/2021 1709 Last data filed at 03/25/2021 1300 Gross per 24 hour  Intake 360 ml  Output --  Net 360 ml   Filed Weights   03/23/21 0237 03/24/21 0700  Weight: 45 kg 41 kg   Body mass index is 16.01 kg/m.  Exam:  General: 75 y.o. year-old female well developed, poorly nourished in no acute distress.  Alert and oriented x3. HEENT: Patient has rash on the right forehead around periorbital and infraorbital area they are drying up  Cardiovascular: Regular rate and rhythm with no rubs or gallops.  No thyromegaly or JVD noted.   Respiratory: Clear to auscultation with no wheezes or  rales. Good inspiratory effort. Abdomen: Soft nontender nondistended with normal bowel sounds x4 quadrants. Musculoskeletal: No lower extremity edema. 2/4 pulses in all 4 extremities. Skin: No ulcerative lesions noted or rashes, Psychiatry: Mood is appropriate for condition and setting    Data Reviewed: CBC: Recent Labs  Lab 03/23/21 0235 03/24/21 0701  WBC 5.7 6.5  NEUTROABS 3.5  --   HGB 7.9* 7.8*  HCT 25.3* 24.8*  MCV 79.8* 79.5*  PLT 358 956   Basic  Metabolic Panel: Recent Labs  Lab 03/23/21 0235 03/24/21 0701 03/25/21 0615  NA 135 137 134*  K 4.0 3.8 3.8  CL 103 107 103  CO2 24 25 24   GLUCOSE 101* 79 81  BUN 20 14 15   CREATININE 1.94* 1.40* 1.23*  CALCIUM 8.8* 9.1 9.3   GFR: Estimated Creatinine Clearance: 25.2 mL/min (A) (by C-G formula based on SCr of 1.23 mg/dL (H)). Liver Function Tests: Recent Labs  Lab 03/23/21 0235 03/24/21 0701  AST 20 24  ALT 12 13  ALKPHOS 82 90  BILITOT 0.8 0.7  PROT 7.8 7.6  ALBUMIN 2.5* 2.5*   No results for input(s): LIPASE, AMYLASE in the last 168 hours. No results for input(s): AMMONIA in the last 168 hours. Coagulation Profile: Recent Labs  Lab 03/23/21 0235  INR 1.2   Cardiac Enzymes: No results for input(s): CKTOTAL, CKMB, CKMBINDEX, TROPONINI in the last 168 hours. BNP (last 3 results) No results for input(s): PROBNP in the last 8760 hours. HbA1C: Recent Labs    03/24/21 0701  HGBA1C 7.2*   CBG: Recent Labs  Lab 03/24/21 1152 03/24/21 1622 03/24/21 2053 03/25/21 0747 03/25/21 1154  GLUCAP 102* 121* 118* 88 91   Lipid Profile: No results for input(s): CHOL, HDL, LDLCALC, TRIG, CHOLHDL, LDLDIRECT in the last 72 hours. Thyroid Function Tests: Recent Labs    03/23/21 0235  TSH 4.888*   Anemia Panel: Recent Labs    03/23/21 0235  VITAMINB12 1,479*   Urine analysis:    Component Value Date/Time   COLORURINE YELLOW 03/23/2021 0235   APPEARANCEUR HAZY (A) 03/23/2021 0235   LABSPEC 1.010 03/23/2021 0235   PHURINE 6.0 03/23/2021 0235   GLUCOSEU NEGATIVE 03/23/2021 0235   HGBUR NEGATIVE 03/23/2021 0235   BILIRUBINUR NEGATIVE 03/23/2021 0235   KETONESUR NEGATIVE 03/23/2021 0235   PROTEINUR NEGATIVE 03/23/2021 0235   UROBILINOGEN 0.2 11/06/2012 1631   NITRITE POSITIVE (A) 03/23/2021 0235   LEUKOCYTESUR TRACE (A) 03/23/2021 0235   Sepsis Labs: @LABRCNTIP (procalcitonin:4,lacticidven:4)  ) Recent Results (from the past 240 hour(s))  Resp Panel by  RT-PCR (Flu A&B, Covid) Nasopharyngeal Swab     Status: None   Collection Time: 03/23/21  4:46 AM   Specimen: Nasopharyngeal Swab; Nasopharyngeal(NP) swabs in vial transport medium  Result Value Ref Range Status   SARS Coronavirus 2 by RT PCR NEGATIVE NEGATIVE Final    Comment: (NOTE) SARS-CoV-2 target nucleic acids are NOT DETECTED.  The SARS-CoV-2 RNA is generally detectable in upper respiratory specimens during the acute phase of infection. The lowest concentration of SARS-CoV-2 viral copies this assay can detect is 138 copies/mL. A negative result does not preclude SARS-Cov-2 infection and should not be used as the sole basis for treatment or other patient management decisions. A negative result may occur with  improper specimen collection/handling, submission of specimen other than nasopharyngeal swab, presence of viral mutation(s) within the areas targeted by this assay, and inadequate number of viral copies(<138 copies/mL). A negative result must be combined with clinical observations, patient  history, and epidemiological information. The expected result is Negative.  Fact Sheet for Patients:  EntrepreneurPulse.com.au  Fact Sheet for Healthcare Providers:  IncredibleEmployment.be  This test is no t yet approved or cleared by the [place] FDA and  has been authorized for detection and/or diagnosis of SARS-CoV-2 by FDA under an Emergency Use Authorization (EUA). This EUA will remain  in effect (meaning this test can be used) for the duration of the COVID-19 declaration under Section 564(b)(1) of the Act, 21 U.S.C.section 360bbb-3(b)(1), unless the authorization is terminated  or revoked sooner.       Influenza A by PCR NEGATIVE NEGATIVE Final   Influenza B by PCR NEGATIVE NEGATIVE Final    Comment: (NOTE) The Xpert Xpress SARS-CoV-2/FLU/RSV plus assay is intended as an aid in the diagnosis of influenza from Nasopharyngeal swab  specimens and should not be used as a sole basis for treatment. Nasal washings and aspirates are unacceptable for Xpert Xpress SARS-CoV-2/FLU/RSV testing.  Fact Sheet for Patients: EntrepreneurPulse.com.au  Fact Sheet for Healthcare Providers: IncredibleEmployment.be  This test is not yet approved or cleared by the [place] FDA and has been authorized for detection and/or diagnosis of SARS-CoV-2 by FDA under an Emergency Use Authorization (EUA). This EUA will remain in effect (meaning this test can be used) for the duration of the COVID-19 declaration under Section 564(b)(1) of the Act, 21 U.S.C. section 360bbb-3(b)(1), unless the authorization is terminated or revoked.  Performed at [facility], [address] [place], [place] [address]       Studies: No results found.  Scheduled Meds:  aspirin  81 mg Oral Daily   atorvastatin  20 mg Oral Daily   cholecalciferol  2,000 Units Oral Daily   clopidogrel  75 mg Oral Daily   donepezil  5 mg Oral QHS   dronabinol  5 mg Oral BID AC   escitalopram  10 mg Oral Daily   feeding supplement  237 mL Oral BID BM   labetalol  300 mg Oral BID   multivitamin with minerals  1 tablet Oral Daily   valACYclovir  1,000 mg Oral Q12H    Continuous Infusions:   LOS: 1 day     Cristal Deer, MD [facility]  To reach me or the doctor on call, go to: [email or web address] Password [facility]  03/25/2021, 5:09 PM

## 2021-03-25 NOTE — Plan of Care (Signed)

## 2021-03-25 NOTE — Progress Notes (Signed)
Per night shift report, pt pulled her IV out. No obvious, viable options for IV noted, will call SWOT nurse to come place IV.   Pt incontinent of urine, cleaned and bed linens changed. Pt denies c/o at present.

## 2021-03-26 LAB — BASIC METABOLIC PANEL
Anion gap: 6 (ref 5–15)
BUN: 15 mg/dL (ref 8–23)
CO2: 25 mmol/L (ref 22–32)
Calcium: 9 mg/dL (ref 8.9–10.3)
Chloride: 103 mmol/L (ref 98–111)
Creatinine, Ser: 1.29 mg/dL — ABNORMAL HIGH (ref 0.44–1.00)
GFR, Estimated: 43 mL/min — ABNORMAL LOW (ref >60)
Glucose, Bld: 117 mg/dL — ABNORMAL HIGH (ref 70–99)
Potassium: 3.5 mmol/L (ref 3.5–5.1)
Sodium: 134 mmol/L — ABNORMAL LOW (ref 135–145)

## 2021-03-26 LAB — GLUCOSE, CAPILLARY: Glucose-Capillary: 95 mg/dL (ref 70–99)

## 2021-03-26 MED ORDER — MEGESTROL ACETATE 40 MG PO TABS: 40.0000 mg | ORAL_TABLET | Freq: Every day | ORAL | 0 refills | Status: AC

## 2021-03-26 MED ORDER — SODIUM CHLORIDE 0.9 % IV SOLN: INTRAVENOUS | Status: DC

## 2021-03-26 MED ORDER — MEGESTROL ACETATE 40 MG PO TABS
40.0000 mg | ORAL_TABLET | Freq: Every day | ORAL | Status: DC
  Administered 2021-03-26: 40 mg via ORAL
  Filled 2021-03-26 (×2): qty 1

## 2021-03-26 MED ORDER — ADULT MULTIVITAMIN W/MINERALS CH: 1.0000 | ORAL_TABLET | Freq: Every day | ORAL | 0 refills | Status: AC

## 2021-03-26 MED ORDER — ENSURE ENLIVE PO LIQD: 237.0000 mL | Freq: Two times a day (BID) | ORAL | 12 refills | Status: AC

## 2021-03-26 MED ORDER — VALACYCLOVIR HCL 1 G PO TABS: 1000.0000 mg | ORAL_TABLET | Freq: Two times a day (BID) | ORAL | 0 refills | Status: AC

## 2021-03-26 NOTE — Progress Notes (Signed)
Pt discharged via WC to POV in care of her daughter. Pt belongings in daughter's possession at time of discharge.

## 2021-03-26 NOTE — Discharge Summary (Signed)
Discharge Summary  Sherri Brooks DDU:[id number] DOB: 1946/02/27  PCP: Redmond School, MD  Admit date: 03/23/2021 Discharge date: 03/26/2021  Time spent: 29 minutes  Recommendations for Outpatient Follow-up:  Primary care provider  Discharge Diagnoses:  Active Hospital Problems   Diagnosis Date Noted   Acute metabolic encephalopathy 23/76/2831   Protein-calorie malnutrition, severe 03/24/2021   Failure to thrive in adult 03/24/2021   Acute encephalopathy 03/23/2021   Microcytic anemia 03/23/2021   Acute renal failure superimposed on stage 3 chronic kidney disease ([place]) 03/23/2021   Herpes zoster ophthalmicus of right eye 03/23/2021   Failure to thrive (child) 03/23/2021   COPD (chronic obstructive pulmonary disease) ([place]) 08/11/2020   Cavitating mass in left upper lung lobe 01/21/2020   Essential hypertension 06/10/2017   Type 2 diabetes mellitus ([place]) 11/04/2012    Resolved Hospital Problems  No resolved problems to display.    Discharge Condition: Improved  Diet recommendation: Cardiac diabetic  Vitals:   03/26/21 0520 03/26/21 1127  BP: (!) 168/48 (!) 166/64  Pulse: 60 (!) 59  Resp: 17 16  Temp: 98.3 F (36.8 C)   SpO2: 100% 100%    History of present illness:  Per admission H&P by Dr. Olevia Bowens:   HPI: Sherri Brooks is a 75 y.o. female with medical history significant of Alzheimer's disease, anemia, anxiety, osteoarthritis, asthma, CAD, history of MI, cellulitis of left buttock, type II DM, GERD, history of heart murmur, hyperlipidemia, hypertension, PVD, COPD, active smoker, left upper lobe mass who is brought to the emergency department by her daughter due to progressively worse decline in mental status of several weeks, but particularly more pronounced since yesterday around noontime when the patient had 2 episodes of "arm flaring or jerking" within 60 to 90 minutes apart followed by periods of disorientation.  Her appetite has been decreased over the past year.  She  has lost 40 to 50 pounds.  She declined to proceed with diagnosis and treatment of the mass after she declined diagnostic procedures offered by pulmonology.  No fever, nausea, vomiting or diarrhea.   ED Course: Initial vital signs were temperature 94.7 F, pulse 50, respirations 16, BP 133/53 mmHg O2 sat 100% on room air.  After 2 hours, the temperature increased to 21 F with warming measures.  She was given a 1000 mL LR bolus.  Hospital Course:  Principal Problem:   Acute metabolic encephalopathy Active Problems:   Type 2 diabetes mellitus (H[facility])   Essential hypertension   Cavitating mass in left upper lung lobe   COPD (chronic obstructive pulmonary disease) ([facility])   Acute encephalopathy   Microcytic anemia   Acute renal failure superimposed on stage 3 chronic kidney disease ([facility])   Herpes zoster ophthalmicus of right eye   Failure to thrive (child)   Protein-calorie malnutrition, severe   Failure to thrive in adult   1 acute metabolic encephalopathy with underlying dementia exacerbated by dehydration and electrolyte imbalance patient has been rehydrated we will continue supportive care and repletion of electrolytes.  This has resolved  EEG has been done and was read as normal there was no seizure or epileptiform discharge seen throughout the recording.  2.  Type 2 diabetes mellitus stable.  Have medications that being held because her blood sugars have been less than 200 continue monitoring and sliding scale if needed.  Her hemoglobin A1c is 7.2.  Her blood sugars are running mostly around 134 this  3.  Essential hypertension continue current antihypertensive regimen  4.  Cavitating mass  in the left upper lobe demonstrating follicle and repeat erosion patient has declined further evaluation or treatment of what appears to be lung cancer.  Patient is DNR  5.  COPD no current wheezing patient with good oxygenation not requiring any supplemental oxygen  6.  Herpes zoster ophthalmicus  of the right eye improving Continue Valtrex  7.  Failure to thrive most likely from malignant lung disease patient is not agreeable to treatment for the lung disease.  We will continue supportive care.  Nurse noted appetite is poor continue Ensure supplement she is also on Marinol due to appetite being poor still have added Megace  8.  Acute kidney injury on chronic kidney disease stage III her current creatinine is 1.23.  Continue hydration  9.  Anemia of chronic disease continue to monitor  Procedures: EEG  Consultations: Palliative care consult  Discharge Exam: BP (!) 166/64 (BP Location: Right Arm)   Pulse (!) 59   Temp 98.3 F (36.8 C) (Oral)   Resp 16   Ht 5\' 3"  (1.6 m)   Wt 41 kg   SpO2 100%   BMI 16.01 kg/m   General: Chronically ill looking Cardiovascular: Regular rate and rhythm no murmur Respiratory: Not in respiratory distress clear to auscultation bilaterally  Discharge Instructions You were cared for by a hospitalist during your hospital stay. If you have any questions about your discharge medications or the care you received while you were in the hospital after you are discharged, you can call the unit and asked to speak with the hospitalist on call if the hospitalist that took care of you is not available. Once you are discharged, your primary care physician will handle any further medical issues. Please note that NO REFILLS for any discharge medications will be authorized once you are discharged, as it is imperative that you return to your primary care physician (or establish a relationship with a primary care physician if you do not have one) for your aftercare needs so that they can reassess your need for medications and monitor your lab values.  Discharge Instructions     Call MD for:  difficulty breathing, headache or visual disturbances   Complete by: As directed    Call MD for:  persistant nausea and vomiting   Complete by: As directed    Call MD for:   severe uncontrolled pain   Complete by: As directed    Call MD for:  temperature >100.4   Complete by: As directed    Diet - low sodium heart healthy   Complete by: As directed    Discharge instructions   Complete by: As directed    Follow-up with primary care provider in 1 to 2 weeks   Increase activity slowly   Complete by: As directed       Allergies as of 03/26/2021       Reactions   Hydrocodone Hives, Itching   Iohexol Hives           Medication List     STOP taking these medications    diphenhydrAMINE 50 MG capsule Commonly known as: BENADRYL   furosemide 20 MG tablet Commonly known as: LASIX   insulin glargine 100 UNIT/ML injection Commonly known as: LANTUS   potassium chloride SA 20 MEQ tablet Commonly known as: KLOR-CON   predniSONE 50 MG tablet Commonly known as: DELTASONE   varenicline 1 MG tablet Commonly known as: Chantix Continuing Month Pak       TAKE these medications  albuterol (2.5 MG/3ML) 0.083% nebulizer solution Commonly known as: PROVENTIL Take 3 mLs (2.5 mg total) by nebulization every 6 (six) hours as needed for wheezing or shortness of breath.   albuterol 108 (90 Base) MCG/ACT inhaler Commonly known as: VENTOLIN HFA Inhale 2 puffs into the lungs every 6 (six) hours as needed. For bronchitis and coughing   alendronate 70 MG tablet Commonly known as: FOSAMAX Take 70 mg by mouth every 7 (seven) days.   ALPRAZolam 0.5 MG tablet Commonly known as: XANAX Take 0.5 mg by mouth 3 (three) times daily.   aspirin 81 MG chewable tablet Chew 1 tablet (81 mg total) by mouth daily.   atorvastatin 20 MG tablet Commonly known as: LIPITOR Take 1 tablet (20 mg total) by mouth at bedtime. What changed: when to take this   clopidogrel 75 MG tablet Commonly known as: PLAVIX Take 75 mg by mouth daily.   donepezil 5 MG tablet Commonly known as: ARICEPT Take 5 mg by mouth at bedtime.   dronabinol 5 MG capsule Commonly known as:  MARINOL Take 5 mg by mouth 2 (two) times daily before lunch and supper.   escitalopram 10 MG tablet Commonly known as: LEXAPRO Take 10 mg by mouth daily.   feeding supplement Liqd Take 237 mLs by mouth 2 (two) times daily between meals.   ferrous sulfate 325 (65 FE) MG tablet Take 1 tablet (325 mg total) by mouth daily with breakfast.   labetalol 300 MG tablet Commonly known as: NORMODYNE Take 1 tablet (300 mg total) by mouth 2 (two) times daily.   megestrol 40 MG tablet Commonly known as: MEGACE Take 1 tablet (40 mg total) by mouth daily.   multivitamin with minerals Tabs tablet Take 1 tablet by mouth daily. Start taking on: March 27, 2021   valACYclovir 1000 MG tablet Commonly known as: VALTREX Take 1 tablet (1,000 mg total) by mouth 2 (two) times daily for 14 days. What changed: when to take this   Vitamin D 50 MCG (2000 UT) Caps Take 2,000 Units by mouth daily.       Allergies  Allergen Reactions   Hydrocodone Hives and Itching   Iohexol Hives           The results of significant diagnostics from this hospitalization (including imaging, microbiology, ancillary and laboratory) are listed below for reference.    Significant Diagnostic Studies: DG Chest 1 View  Result Date: 03/23/2021 CLINICAL DATA:  Altered mental status EXAM: CHEST  1 VIEW COMPARISON:  09/10/2020 FINDINGS: Left apical mass has significantly enlarged in the interval measuring 6.9 x 5.9 cm, in keeping with a enlarging primary neoplasm. Right lung is clear. No pneumothorax or pleural effusion. Cardiac size within normal limits. Pulmonary vascularity is normal. The anterior left first rib appears eroded. IMPRESSION: Progressive enlargement of left apical mass now with left first rib erosion. Electronically Signed   By: Fidela Salisbury MD   On: 03/23/2021 03:26   CT HEAD WO CONTRAST  Result Date: 03/23/2021 CLINICAL DATA:  Confusion EXAM: CT HEAD WITHOUT CONTRAST TECHNIQUE: Contiguous axial images  were obtained from the base of the skull through the vertex without intravenous contrast. COMPARISON:  CT head 03/15/2021, PT CT 03/07/20, MRI head 01/21/2020 FINDINGS: Brain: Cerebral ventricle sizes are concordant with the degree of cerebral volume loss. Patchy and confluent areas of decreased attenuation are noted throughout the deep and periventricular white matter of the cerebral hemispheres bilaterally, compatible with chronic microvascular ischemic disease. No evidence of large-territorial acute  infarction. No parenchymal hemorrhage. No mass lesion. No extra-axial collection. No mass effect or midline shift. No hydrocephalus. Basilar cisterns are patent. Vascular: No hyperdense vessel. Atherosclerotic calcifications are present within the cavernous internal carotid arteries. Skull: No acute fracture or focal lesion. Sinuses/Orbits: Persistent bilateral partial opacification of mastoid air cells, right greater than left. No fluid within the middle ears. Almost complete opacification of bilateral ethmoid sinuses. Mucosal thickening of the right sphenoid and left frontal sinus. Complete opacification of the right frontal sinus. The orbits are unremarkable. Other: None. IMPRESSION: 1. No acute intracranial abnormality. 2. Pansinusitis with partial opacification of bilateral mastoid air cells. Electronically Signed   By: Iven Finn M.D.   On: 03/23/2021 03:35   CT Head Wo Contrast  Result Date: 03/15/2021 CLINICAL DATA:  Head trauma, minor. Facial trauma. Neck trauma. Additional history provided: Patient's daughter reports patient fell on Monday, awoke this morning with right eye swollen, head trauma. EXAM: CT HEAD WITHOUT CONTRAST CT MAXILLOFACIAL WITHOUT CONTRAST CT CERVICAL SPINE WITHOUT CONTRAST TECHNIQUE: Multidetector CT imaging of the head, cervical spine, and maxillofacial structures were performed using the standard protocol without intravenous contrast. Multiplanar CT image reconstructions of the  cervical spine and maxillofacial structures were also generated. COMPARISON:  Brain MRI 01/21/2020. Head CT 01/21/2020. CT angiogram neck 08/22/2020. FINDINGS: CT HEAD FINDINGS Brain: Mild-to-moderate cerebral atrophy. Mild patchy and ill-defined hypoattenuation within the cerebral white matter, nonspecific but compatible with chronic small vessel ischemic disease. There is no acute intracranial hemorrhage. No demarcated cortical infarct. No extra-axial fluid collection. No evidence of intracranial mass. No midline shift. Partially empty sella turcica. Vascular: No hyperdense vessel.  Atherosclerotic calcifications. Skull: Normal. Negative for fracture or focal lesion. Other: Right forehead and periorbital soft tissue swelling. Small right mastoid effusion. CT MAXILLOFACIAL FINDINGS Mild motion degradation at the level of the mandible, limiting evaluation. Osseous: No acute maxillofacial fracture is identified. Orbits: Right forehead and periorbital soft tissue swelling. No acute finding within the orbits. The globes are normal in size and contour. The extraocular muscles and optic nerve sheath complexes are symmetric and unremarkable. Sinuses: Complete opacification of the right frontal sinus and of the anterior right ethmoid air cells. Moderate mucosal thickening within the left frontal sinus, left ethmoid air cells and posterior right ethmoid air cells. Moderate mucosal thickening and possible fluid level within the right sphenoid sinus. Complete opacification of the right maxillary sinus. Moderate mucosal thickening and possible fluid level within the left maxillary sinus. Soft tissues: Right forehead and periorbital soft tissue swelling. CT CERVICAL SPINE FINDINGS Alignment: Reversal of the expected cervical lordosis. 3 mm C3-C4, C4-C5 and C5-C6 grade 1 anterolisthesis. Skull base and vertebrae: The basion-dental and atlanto-dental intervals are maintained.No evidence of acute fracture to the cervical spine.  Congenital nonunion of the anterior and posterior arches of C1. Soft tissues and spinal canal: No prevertebral fluid or swelling. No visible canal hematoma. Disc levels: Focal spondylosis with multilevel disc space narrowing, disc bulges and facet arthrosis. No appreciable high-grade spinal canal stenosis. No high-grade bony neural foraminal narrowing. Upper chest: Partially imaged left upper lobe lung mass. New from the prior CTA neck of 08/22/2020, there is partially imaged infiltration of the left retroclavicular soft tissues. Additionally, the left first rib and portions of the anterolateral left second rib are poorly delineated. This may be secondary to interval surgery or erosion. IMPRESSION: CT head: 1. No evidence of acute intracranial abnormality. 2. Right forehead and periorbital soft tissue swelling. 3. Mild chronic small vessel ischemic disease  within the cerebral white matter. 4. Mild-to-moderate cerebral atrophy. 5. Small right mastoid effusion. CT maxillofacial: 1. Mild motion degradation at the level of the mandible, limiting evaluation. 2. No evidence of acute maxillofacial fracture. 3. Right forehead and periorbital soft tissue swelling. 4. Severe paranasal sinus disease, as described. CT cervical spine: 1. No evidence of acute fracture to the cervical spine. 2. 3 mm C3-C4, C4-C5 and C5-C6 grade 1 anterolisthesis. C5-C6 grade 1 anterolisthesis is new as compared to the prior CTA neck of 08/22/2020. Consider a cervical spine MRI to exclude ligamentous injury at this level, as clinically warranted. 3. Cervical spondylosis, as described. 4. Partially imaged known left upper lobe lung mass. New from the prior CTA neck of 08/22/2020, there is partially imaged infiltration of the left retroclavicular soft tissues. Additionally, the left first rib and portions of the anterolateral left second rib are poorly delineated. This may be secondary to interval surgery and/or osseous erosion. A nonemergent  follow-up chest CT is recommended to exclude interval tumor progression, as clinically appropriate. Electronically Signed   By: Kellie Simmering DO   On: 03/15/2021 14:06   CT Cervical Spine Wo Contrast  Result Date: 03/15/2021 CLINICAL DATA:  Head trauma, minor. Facial trauma. Neck trauma. Additional history provided: Patient's daughter reports patient fell on Monday, awoke this morning with right eye swollen, head trauma. EXAM: CT HEAD WITHOUT CONTRAST CT MAXILLOFACIAL WITHOUT CONTRAST CT CERVICAL SPINE WITHOUT CONTRAST TECHNIQUE: Multidetector CT imaging of the head, cervical spine, and maxillofacial structures were performed using the standard protocol without intravenous contrast. Multiplanar CT image reconstructions of the cervical spine and maxillofacial structures were also generated. COMPARISON:  Brain MRI 01/21/2020. Head CT 01/21/2020. CT angiogram neck 08/22/2020. FINDINGS: CT HEAD FINDINGS Brain: Mild-to-moderate cerebral atrophy. Mild patchy and ill-defined hypoattenuation within the cerebral white matter, nonspecific but compatible with chronic small vessel ischemic disease. There is no acute intracranial hemorrhage. No demarcated cortical infarct. No extra-axial fluid collection. No evidence of intracranial mass. No midline shift. Partially empty sella turcica. Vascular: No hyperdense vessel.  Atherosclerotic calcifications. Skull: Normal. Negative for fracture or focal lesion. Other: Right forehead and periorbital soft tissue swelling. Small right mastoid effusion. CT MAXILLOFACIAL FINDINGS Mild motion degradation at the level of the mandible, limiting evaluation. Osseous: No acute maxillofacial fracture is identified. Orbits: Right forehead and periorbital soft tissue swelling. No acute finding within the orbits. The globes are normal in size and contour. The extraocular muscles and optic nerve sheath complexes are symmetric and unremarkable. Sinuses: Complete opacification of the right frontal  sinus and of the anterior right ethmoid air cells. Moderate mucosal thickening within the left frontal sinus, left ethmoid air cells and posterior right ethmoid air cells. Moderate mucosal thickening and possible fluid level within the right sphenoid sinus. Complete opacification of the right maxillary sinus. Moderate mucosal thickening and possible fluid level within the left maxillary sinus. Soft tissues: Right forehead and periorbital soft tissue swelling. CT CERVICAL SPINE FINDINGS Alignment: Reversal of the expected cervical lordosis. 3 mm C3-C4, C4-C5 and C5-C6 grade 1 anterolisthesis. Skull base and vertebrae: The basion-dental and atlanto-dental intervals are maintained.No evidence of acute fracture to the cervical spine. Congenital nonunion of the anterior and posterior arches of C1. Soft tissues and spinal canal: No prevertebral fluid or swelling. No visible canal hematoma. Disc levels: Focal spondylosis with multilevel disc space narrowing, disc bulges and facet arthrosis. No appreciable high-grade spinal canal stenosis. No high-grade bony neural foraminal narrowing. Upper chest: Partially imaged left upper lobe lung  mass. New from the prior CTA neck of 08/22/2020, there is partially imaged infiltration of the left retroclavicular soft tissues. Additionally, the left first rib and portions of the anterolateral left second rib are poorly delineated. This may be secondary to interval surgery or erosion. IMPRESSION: CT head: 1. No evidence of acute intracranial abnormality. 2. Right forehead and periorbital soft tissue swelling. 3. Mild chronic small vessel ischemic disease within the cerebral white matter. 4. Mild-to-moderate cerebral atrophy. 5. Small right mastoid effusion. CT maxillofacial: 1. Mild motion degradation at the level of the mandible, limiting evaluation. 2. No evidence of acute maxillofacial fracture. 3. Right forehead and periorbital soft tissue swelling. 4. Severe paranasal sinus disease,  as described. CT cervical spine: 1. No evidence of acute fracture to the cervical spine. 2. 3 mm C3-C4, C4-C5 and C5-C6 grade 1 anterolisthesis. C5-C6 grade 1 anterolisthesis is new as compared to the prior CTA neck of 08/22/2020. Consider a cervical spine MRI to exclude ligamentous injury at this level, as clinically warranted. 3. Cervical spondylosis, as described. 4. Partially imaged known left upper lobe lung mass. New from the prior CTA neck of 08/22/2020, there is partially imaged infiltration of the left retroclavicular soft tissues. Additionally, the left first rib and portions of the anterolateral left second rib are poorly delineated. This may be secondary to interval surgery and/or osseous erosion. A nonemergent follow-up chest CT is recommended to exclude interval tumor progression, as clinically appropriate. Electronically Signed   By: Kellie Simmering DO   On: 03/15/2021 14:06   EEG adult  Result Date: 03/24/2021 Lora Havens, MD     03/24/2021  6:11 PM Patient Name: Sherri Brooks MRN: [id number] Epilepsy Attending: Lora Havens Referring Physician/Provider: Dr. Tennis Must Date: 03/24/2021 Duration: 25.26 mins Patient history: 75 year old female with altered mental status.  EEG to evaluate seizures. Level of alertness: Awake AEDs during EEG study: None Technical aspects: This EEG study was done with scalp electrodes positioned according to the 10-20 International system of electrode placement. Electrical activity was acquired at a sampling rate of 500Hz  and reviewed with a high frequency filter of 70Hz  and a low frequency filter of 1Hz . EEG data were recorded continuously and digitally stored. Description: The posterior dominant rhythm consists of 8-9 Hz activity of moderate voltage (25-35 uV) seen predominantly in posterior head regions, symmetric and reactive to eye opening and eye closing. Hyperventilation and photic stimulation were not performed.   IMPRESSION: This study is within normal  limits. No seizures or epileptiform discharges were seen throughout the recording. Lora Havens   CT Maxillofacial WO CM  Result Date: 03/15/2021 CLINICAL DATA:  Head trauma, minor. Facial trauma. Neck trauma. Additional history provided: Patient's daughter reports patient fell on Monday, awoke this morning with right eye swollen, head trauma. EXAM: CT HEAD WITHOUT CONTRAST CT MAXILLOFACIAL WITHOUT CONTRAST CT CERVICAL SPINE WITHOUT CONTRAST TECHNIQUE: Multidetector CT imaging of the head, cervical spine, and maxillofacial structures were performed using the standard protocol without intravenous contrast. Multiplanar CT image reconstructions of the cervical spine and maxillofacial structures were also generated. COMPARISON:  Brain MRI 01/21/2020. Head CT 01/21/2020. CT angiogram neck 08/22/2020. FINDINGS: CT HEAD FINDINGS Brain: Mild-to-moderate cerebral atrophy. Mild patchy and ill-defined hypoattenuation within the cerebral white matter, nonspecific but compatible with chronic small vessel ischemic disease. There is no acute intracranial hemorrhage. No demarcated cortical infarct. No extra-axial fluid collection. No evidence of intracranial mass. No midline shift. Partially empty sella turcica. Vascular: No hyperdense vessel.  Atherosclerotic calcifications. Skull:  Normal. Negative for fracture or focal lesion. Other: Right forehead and periorbital soft tissue swelling. Small right mastoid effusion. CT MAXILLOFACIAL FINDINGS Mild motion degradation at the level of the mandible, limiting evaluation. Osseous: No acute maxillofacial fracture is identified. Orbits: Right forehead and periorbital soft tissue swelling. No acute finding within the orbits. The globes are normal in size and contour. The extraocular muscles and optic nerve sheath complexes are symmetric and unremarkable. Sinuses: Complete opacification of the right frontal sinus and of the anterior right ethmoid air cells. Moderate mucosal thickening  within the left frontal sinus, left ethmoid air cells and posterior right ethmoid air cells. Moderate mucosal thickening and possible fluid level within the right sphenoid sinus. Complete opacification of the right maxillary sinus. Moderate mucosal thickening and possible fluid level within the left maxillary sinus. Soft tissues: Right forehead and periorbital soft tissue swelling. CT CERVICAL SPINE FINDINGS Alignment: Reversal of the expected cervical lordosis. 3 mm C3-C4, C4-C5 and C5-C6 grade 1 anterolisthesis. Skull base and vertebrae: The basion-dental and atlanto-dental intervals are maintained.No evidence of acute fracture to the cervical spine. Congenital nonunion of the anterior and posterior arches of C1. Soft tissues and spinal canal: No prevertebral fluid or swelling. No visible canal hematoma. Disc levels: Focal spondylosis with multilevel disc space narrowing, disc bulges and facet arthrosis. No appreciable high-grade spinal canal stenosis. No high-grade bony neural foraminal narrowing. Upper chest: Partially imaged left upper lobe lung mass. New from the prior CTA neck of 08/22/2020, there is partially imaged infiltration of the left retroclavicular soft tissues. Additionally, the left first rib and portions of the anterolateral left second rib are poorly delineated. This may be secondary to interval surgery or erosion. IMPRESSION: CT head: 1. No evidence of acute intracranial abnormality. 2. Right forehead and periorbital soft tissue swelling. 3. Mild chronic small vessel ischemic disease within the cerebral white matter. 4. Mild-to-moderate cerebral atrophy. 5. Small right mastoid effusion. CT maxillofacial: 1. Mild motion degradation at the level of the mandible, limiting evaluation. 2. No evidence of acute maxillofacial fracture. 3. Right forehead and periorbital soft tissue swelling. 4. Severe paranasal sinus disease, as described. CT cervical spine: 1. No evidence of acute fracture to the  cervical spine. 2. 3 mm C3-C4, C4-C5 and C5-C6 grade 1 anterolisthesis. C5-C6 grade 1 anterolisthesis is new as compared to the prior CTA neck of 08/22/2020. Consider a cervical spine MRI to exclude ligamentous injury at this level, as clinically warranted. 3. Cervical spondylosis, as described. 4. Partially imaged known left upper lobe lung mass. New from the prior CTA neck of 08/22/2020, there is partially imaged infiltration of the left retroclavicular soft tissues. Additionally, the left first rib and portions of the anterolateral left second rib are poorly delineated. This may be secondary to interval surgery and/or osseous erosion. A nonemergent follow-up chest CT is recommended to exclude interval tumor progression, as clinically appropriate. Electronically Signed   By: Kellie Simmering DO   On: 03/15/2021 14:06    Microbiology: Recent Results (from the past 240 hour(s))  Resp Panel by RT-PCR (Flu A&B, Covid) Nasopharyngeal Swab     Status: None   Collection Time: 03/23/21  4:46 AM   Specimen: Nasopharyngeal Swab; Nasopharyngeal(NP) swabs in vial transport medium  Result Value Ref Range Status   SARS Coronavirus 2 by RT PCR NEGATIVE NEGATIVE Final    Comment: (NOTE) SARS-CoV-2 target nucleic acids are NOT DETECTED.  The SARS-CoV-2 RNA is generally detectable in upper respiratory specimens during the acute phase of infection.  The lowest concentration of SARS-CoV-2 viral copies this assay can detect is 138 copies/mL. A negative result does not preclude SARS-Cov-2 infection and should not be used as the sole basis for treatment or other patient management decisions. A negative result may occur with  improper specimen collection/handling, submission of specimen other than nasopharyngeal swab, presence of viral mutation(s) within the areas targeted by this assay, and inadequate number of viral copies(<138 copies/mL). A negative result must be combined with clinical observations, patient history,  and epidemiological information. The expected result is Negative.  Fact Sheet for Patients:  EntrepreneurPulse.com.au  Fact Sheet for Healthcare Providers:  IncredibleEmployment.be  This test is no t yet approved or cleared by the [place] FDA and  has been authorized for detection and/or diagnosis of SARS-CoV-2 by FDA under an Emergency Use Authorization (EUA). This EUA will remain  in effect (meaning this test can be used) for the duration of the COVID-19 declaration under Section 564(b)(1) of the Act, 21 U.S.C.section 360bbb-3(b)(1), unless the authorization is terminated  or revoked sooner.       Influenza A by PCR NEGATIVE NEGATIVE Final   Influenza B by PCR NEGATIVE NEGATIVE Final    Comment: (NOTE) The Xpert Xpress SARS-CoV-2/FLU/RSV plus assay is intended as an aid in the diagnosis of influenza from Nasopharyngeal swab specimens and should not be used as a sole basis for treatment. Nasal washings and aspirates are unacceptable for Xpert Xpress SARS-CoV-2/FLU/RSV testing.  Fact Sheet for Patients: EntrepreneurPulse.com.au  Fact Sheet for Healthcare Providers: IncredibleEmployment.be  This test is not yet approved or cleared by the [place] FDA and has been authorized for detection and/or diagnosis of SARS-CoV-2 by FDA under an Emergency Use Authorization (EUA). This EUA will remain in effect (meaning this test can be used) for the duration of the COVID-19 declaration under Section 564(b)(1) of the Act, 21 U.S.C. section 360bbb-3(b)(1), unless the authorization is terminated or revoked.  Performed at [facility], [address], [place], [place] [address]      Labs: Basic Metabolic Panel: Recent Labs  Lab 03/23/21 0235 03/24/21 0701 03/25/21 0615  NA 135 137 134*  K 4.0 3.8 3.8  CL 103 107 103  CO2 24 25 24   GLUCOSE 101* 79 81  BUN 20 14 15   CREATININE 1.94* 1.40* 1.23*   CALCIUM 8.8* 9.1 9.3   Liver Function Tests: Recent Labs  Lab 03/23/21 0235 03/24/21 0701  AST 20 24  ALT 12 13  ALKPHOS 82 90  BILITOT 0.8 0.7  PROT 7.8 7.6  ALBUMIN 2.5* 2.5*   No results for input(s): LIPASE, AMYLASE in the last 168 hours. No results for input(s): AMMONIA in the last 168 hours. CBC: Recent Labs  Lab 03/23/21 0235 03/24/21 0701  WBC 5.7 6.5  NEUTROABS 3.5  --   HGB 7.9* 7.8*  HCT 25.3* 24.8*  MCV 79.8* 79.5*  PLT 358 385   Cardiac Enzymes: No results for input(s): CKTOTAL, CKMB, CKMBINDEX, TROPONINI in the last 168 hours. BNP: BNP (last 3 results) No results for input(s): BNP in the last 8760 hours.  ProBNP (last 3 results) No results for input(s): PROBNP in the last 8760 hours.  CBG: Recent Labs  Lab 03/25/21 0747 03/25/21 1154 03/25/21 1702 03/25/21 2125 03/26/21 0605  GLUCAP 88 91 123* 134* 95       Signed:  Cristal Deer, MD [facility] 03/26/2021, 11:51 AM

## 2021-03-27 ENCOUNTER — Other Ambulatory Visit: Payer: Self-pay

## 2021-03-27 ENCOUNTER — Other Ambulatory Visit: Payer: Self-pay | Admitting: Primary Care

## 2021-03-27 DIAGNOSIS — N183 Chronic kidney disease, stage 3 unspecified: Secondary | ICD-10-CM | POA: Diagnosis not present

## 2021-03-27 DIAGNOSIS — Z515 Encounter for palliative care: Secondary | ICD-10-CM | POA: Diagnosis not present

## 2021-03-27 DIAGNOSIS — J449 Chronic obstructive pulmonary disease, unspecified: Secondary | ICD-10-CM

## 2021-03-27 DIAGNOSIS — E43 Unspecified severe protein-calorie malnutrition: Secondary | ICD-10-CM

## 2021-03-27 DIAGNOSIS — J984 Other disorders of lung: Secondary | ICD-10-CM

## 2021-03-27 LAB — GLUCOSE, CAPILLARY
Glucose-Capillary: 98 mg/dL (ref 70–99)
Glucose-Capillary: 96 mg/dL (ref 70–99)

## 2021-03-27 NOTE — Progress Notes (Addendum)
[facility] Consult Note Telephone: [phone]  Fax: [phone]    Date of encounter: 03/27/21 PATIENT NAME: Sherri Brooks [address] [place] [address]   [phone] (home)  DOB: October 24, 1945 MRN: [id number] PRIMARY CARE PROVIDER:    Redmond School, MD,  [address] [place] [address] [phone]  REFERRING PROVIDER:   Spero Geralds, [place],  [place] [address] [phone]    RESPONSIBLE PARTY:    Contact Information     Name Relation Home Work Mobile   [place] Brooks [phone]  [phone]        I met face to face with patient and family in  home. Palliative Care was asked to follow this patient by consultation request of  Spero Geralds, MD to address advance care planning and complex medical decision making. This is the initial visit.                                     ASSESSMENT AND PLAN / RECOMMENDATIONS:   Advance Care Planning/Goals of Care: Goals include to maximize quality of life and symptom management. Our advance care planning conversation included a discussion about:    The value and importance of advance care planning  Experiences with loved ones who have been seriously ill or have died  Exploration of personal, cultural or spiritual beliefs that might influence medical decisions  Exploration of goals of care in the event of a sudden injury or illness  Identification  of a healthcare agent - Brooks Review and updating and creation of an  advance directive document . Decision not to resuscitate due to poor prognosis. CODE STATUS: DNR, MOST created and uploaded Discussed advance directives, getting HCPOA. Does not have this now. Discussed getting additional care giver help. Not eligible for medicaid.  Will continue to discuss palliative vs hospice services. She may be a candidate with concordant goals of care for hospice.  Symptom  Management/Plan:  Pt referred for palliative assessment. Recent inpatient due to dehydration due to poor intake. Pt has lost 30 lbs in 9 mos, and has had a nodule noted but not worked up in her lung.  This is 25%  and quite concerning for disease advancement. She is a heavy smoker and still is smoking currently. Other concerns include dementia and care giving strain on her Brooks, in whose home she is now living.  Pt had diuretic d/c at hospital but Brooks gave it inadvertently today. She will call pharmacy and get it removed from packets. Today her bp was 112/54 but she did not show sx of hypotension. She stays in a w/c in lieu of ambulation. She wanted to pursue some strengthening and we will reach out to [facility] for assessment. If her potential is poor for rehab she would be a reasonable candidate to elect hospice if consistent with goals.   Follow up Palliative Care Visit: Palliative care will continue to follow for complex medical decision making, advance care planning, and clarification of goals. Return 2-4 weeks or prn.  I spent 76 minutes providing this consultation. More than 50% of the time in this consultation was spent in counseling and care coordination.  PPS: 30%  HOSPICE ELIGIBILITY/DIAGNOSIS: TBD  Chief Complaint: weakness  HISTORY OF PRESENT ILLNESS:  Sherri Brooks is a 75 y.o. year old female  with CAD, COPD, DM, wt loss, debility. Presents today after  hospital stay for weakness and dehydration. Endorses smoking some. Desires to have PT to address weakness, no longer able to ambulate which is a recent loss of function. Pt had dx a lung nodule a year ago and has had a 30 lb wt loss in 9 months 120 lbs to 90 lbs now.  History obtained from review of EMR, discussion with primary team, and interview with family, facility staff/caregiver and/or Ms. Romick.   I reviewed available labs, medications, imaging, studies and related documents from the EMR.  Records reviewed and  summarized above.   ROS   General: NAD EYES: endorses recent zoster outbreak, R trigeminal ENMT: denies dysphagia Cardiovascular: denies chest pain,endorses DOE Pulmonary: endorses slight cough, endorses increased SOB Abdomen: endorses fair appetite, denies constipation, endorses continence of bowel GU: denies dysuria, endorses continence of urine MSK:  endorses weakness,  no falls reported Skin: denies rashes or wounds Neurological: denies pain, denies insomnia, endorses forgetfulness Psych: Endorses positive mood Heme/lymph/immuno: denies bruises, abnormal bleeding  Physical Exam: Current and past weights: 25 % loss in 9 months, now 90 lbs Constitutional: NAD General: frail appearing, thin EYES: anicteric sclera, lids intact, R eye discharge from zoster ENMT: intact hearing, oral mucous membranes moist CV: S1S2, RRR, no LE edema Pulmonary: LCTA, no increased work of breathing, + cough, room air PO2= 89% Abdomen: intake 50%, no ascites GU: deferred MSK: severe sarcopenia, moves all extremities, non ambulatory Skin: warm and dry, no rashes or wounds on visible skin Neuro:  ++ generalized weakness,  + cognitive impairment Psych: non-anxious affect, A and O x 2 Hem/lymph/immuno: no widespread bruising   CURRENT PROBLEM LIST:  Patient Active Problem List   Diagnosis Date Noted   Protein-calorie malnutrition, severe 03/24/2021   Failure to thrive in adult 03/24/2021   Acute encephalopathy 03/23/2021   Microcytic anemia 03/23/2021   Acute renal failure superimposed on stage 3 chronic kidney disease ([place] [facility]) 03/23/2021   Herpes zoster ophthalmicus of right eye 03/23/2021   Failure to thrive (child) 03/23/2021   COPD (chronic obstructive pulmonary disease) ([facility]) 08/11/2020   Nodule of parotid gland 04/27/2020   CKD (chronic kidney disease) stage 3, GFR 30-59 ml/min ([place]) 01/22/2020   Memory loss    Acute metabolic encephalopathy 50/27/7412   Cavitating mass in left upper lung  lobe 01/21/2020   Acute kidney injury ([place]) 06/10/2017   Dyspnea 06/10/2017   Essential hypertension 06/10/2017   PVD (peripheral vascular disease) ([place])    Ankle fracture, bimalleolar, closed 12/29/2012   Anemia of unknown etiology 11/06/2012   Atherosclerotic PVD with ulceration ([facility] [facility]) 11/04/2012   Type 2 diabetes mellitus ([place]) 11/04/2012   Tobacco use disorder 11/04/2012   Open wound of left foot 11/04/2012   Atherosclerosis of native arteries of the extremities with ulceration ([place]) 07/09/2012   Weight loss, unintentional 09/18/2011   Anorexia 09/18/2011   Early satiety 09/18/2011   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/07/2011   PAST MEDICAL HISTORY:  Active Ambulatory Problems    Diagnosis Date Noted   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/07/2011   Weight loss, unintentional 09/18/2011   Anorexia 09/18/2011   Early satiety 09/18/2011   Atherosclerosis of native arteries of the extremities with ulceration ([place]) 07/09/2012   Atherosclerotic PVD with ulceration ([place]) 11/04/2012   Type 2 diabetes mellitus ([facility]) 11/04/2012   Tobacco use disorder 11/04/2012   Open wound of left foot 11/04/2012   Anemia of unknown etiology 11/06/2012   Ankle fracture, bimalleolar, closed 12/29/2012   Acute kidney injury ([facility]) 06/10/2017  Dyspnea 06/10/2017   Essential hypertension 06/10/2017   PVD (peripheral vascular disease) ([facility])    Acute metabolic encephalopathy 19/50/9326   Cavitating mass in left upper lung lobe 01/21/2020   CKD (chronic kidney disease) stage 3, GFR 30-59 ml/min ([facility]) 01/22/2020   Memory loss    Nodule of parotid gland 04/27/2020   COPD (chronic obstructive pulmonary disease) ([place]) 08/11/2020   Acute encephalopathy 03/23/2021   Microcytic anemia 03/23/2021   Acute renal failure superimposed on stage 3 chronic kidney disease ([facility]) 03/23/2021   Herpes zoster ophthalmicus of right eye 03/23/2021   Failure to thrive (child) 03/23/2021   Protein-calorie malnutrition, severe 03/24/2021    Failure to thrive in adult 03/24/2021   Resolved Ambulatory Problems    Diagnosis Date Noted   No Resolved Ambulatory Problems   Past Medical History:  Diagnosis Date   Alzheimer disease ([facility])    Anemia    Anxiety    Arthritis    Asthma    CAD (coronary artery disease)    Cellulitis of buttock, left 2010   Diabetes mellitus    GERD (gastroesophageal reflux disease)    Heart murmur    Hyperlipidemia    Hypertension    MI (myocardial infarction) ([place])    Wears glasses    SOCIAL HX:  Social History   Tobacco Use   Smoking status: Every Day    Packs/day: 0.50    Years: 30.00    Pack years: 15.00    Types: Cigarettes    Start date: 11/04/2012   Smokeless tobacco: Never   Tobacco comments:    1-2 cigarettes a day  Substance Use Topics   Alcohol use: No    Alcohol/week: 0.0 standard drinks   FAMILY HX:  Family History  Problem Relation Age of Onset   Brain cancer Father    Cancer Father    Diabetes Mother        many family members   Alzheimer's disease Mother    Heart disease Other       ALLERGIES:  Allergies  Allergen Reactions   Hydrocodone Hives and Itching   Iohexol Hives          PERTINENT MEDICATIONS:  Outpatient Encounter Medications as of 03/27/2021  Medication Sig   albuterol (PROVENTIL HFA;VENTOLIN HFA) 108 (90 BASE) MCG/ACT inhaler Inhale 2 puffs into the lungs every 6 (six) hours as needed. For bronchitis and coughing   albuterol (PROVENTIL) (2.5 MG/3ML) 0.083% nebulizer solution Take 3 mLs (2.5 mg total) by nebulization every 6 (six) hours as needed for wheezing or shortness of breath.   alendronate (FOSAMAX) 70 MG tablet Take 70 mg by mouth every 7 (seven) days.    ALPRAZolam (XANAX) 0.5 MG tablet Take 0.5 mg by mouth 3 (three) times daily.   aspirin 81 MG chewable tablet Chew 1 tablet (81 mg total) by mouth daily.   atorvastatin (LIPITOR) 20 MG tablet Take 1 tablet (20 mg total) by mouth at bedtime.   Cholecalciferol (VITAMIN D) 50 MCG  (2000 UT) CAPS Take 2,000 Units by mouth daily.   clopidogrel (PLAVIX) 75 MG tablet Take 75 mg by mouth daily.   donepezil (ARICEPT) 5 MG tablet Take 5 mg by mouth at bedtime.    dronabinol (MARINOL) 5 MG capsule Take 5 mg by mouth 2 (two) times daily before lunch and supper.    escitalopram (LEXAPRO) 10 MG tablet Take 10 mg by mouth daily.   feeding supplement (ENSURE ENLIVE / ENSURE PLUS) LIQD Take 237 mLs  by mouth 2 (two) times daily between meals.   ferrous sulfate 325 (65 FE) MG tablet Take 1 tablet (325 mg total) by mouth daily with breakfast.   labetalol (NORMODYNE) 300 MG tablet Take 1 tablet (300 mg total) by mouth 2 (two) times daily.   megestrol (MEGACE) 40 MG tablet Take 1 tablet (40 mg total) by mouth daily.   Multiple Vitamin (MULTIVITAMIN WITH MINERALS) TABS tablet Take 1 tablet by mouth daily.   valACYclovir (VALTREX) 1000 MG tablet Take 1 tablet (1,000 mg total) by mouth 2 (two) times daily for 14 days.   No facility-administered encounter medications on file as of 03/27/2021.    Thank you for the opportunity to participate in the care of Ms. Hensler.  The palliative care team will continue to follow. Please call our office at [phone] if we can be of additional assistance.   Jason Coop, NP , DNP, MPH, AGPCNP-BC, ACHPN  COVID-19 PATIENT SCREENING TOOL Asked and negative response unless otherwise noted:   Have you had symptoms of covid, tested positive or been in contact with someone with symptoms/positive test in the past 5-10 days?

## 2021-03-29 DIAGNOSIS — N179 Acute kidney failure, unspecified: Secondary | ICD-10-CM | POA: Diagnosis not present

## 2021-03-29 DIAGNOSIS — I129 Hypertensive chronic kidney disease with stage 1 through stage 4 chronic kidney disease, or unspecified chronic kidney disease: Secondary | ICD-10-CM | POA: Diagnosis not present

## 2021-03-29 DIAGNOSIS — G9340 Encephalopathy, unspecified: Secondary | ICD-10-CM | POA: Diagnosis not present

## 2021-03-29 DIAGNOSIS — J449 Chronic obstructive pulmonary disease, unspecified: Secondary | ICD-10-CM | POA: Diagnosis not present

## 2021-03-29 DIAGNOSIS — J984 Other disorders of lung: Secondary | ICD-10-CM | POA: Diagnosis not present

## 2021-03-29 DIAGNOSIS — N183 Chronic kidney disease, stage 3 unspecified: Secondary | ICD-10-CM | POA: Diagnosis not present

## 2021-03-29 DIAGNOSIS — E1122 Type 2 diabetes mellitus with diabetic chronic kidney disease: Secondary | ICD-10-CM | POA: Diagnosis not present

## 2021-03-29 DIAGNOSIS — R627 Adult failure to thrive: Secondary | ICD-10-CM | POA: Diagnosis not present

## 2021-03-29 DIAGNOSIS — E43 Unspecified severe protein-calorie malnutrition: Secondary | ICD-10-CM | POA: Diagnosis not present

## 2021-03-31 DIAGNOSIS — J984 Other disorders of lung: Secondary | ICD-10-CM | POA: Diagnosis not present

## 2021-03-31 DIAGNOSIS — I129 Hypertensive chronic kidney disease with stage 1 through stage 4 chronic kidney disease, or unspecified chronic kidney disease: Secondary | ICD-10-CM | POA: Diagnosis not present

## 2021-03-31 DIAGNOSIS — E43 Unspecified severe protein-calorie malnutrition: Secondary | ICD-10-CM | POA: Diagnosis not present

## 2021-03-31 DIAGNOSIS — R627 Adult failure to thrive: Secondary | ICD-10-CM | POA: Diagnosis not present

## 2021-03-31 DIAGNOSIS — N183 Chronic kidney disease, stage 3 unspecified: Secondary | ICD-10-CM | POA: Diagnosis not present

## 2021-03-31 DIAGNOSIS — J449 Chronic obstructive pulmonary disease, unspecified: Secondary | ICD-10-CM | POA: Diagnosis not present

## 2021-03-31 DIAGNOSIS — N179 Acute kidney failure, unspecified: Secondary | ICD-10-CM | POA: Diagnosis not present

## 2021-03-31 DIAGNOSIS — E1122 Type 2 diabetes mellitus with diabetic chronic kidney disease: Secondary | ICD-10-CM | POA: Diagnosis not present

## 2021-03-31 DIAGNOSIS — G9340 Encephalopathy, unspecified: Secondary | ICD-10-CM | POA: Diagnosis not present

## 2021-04-03 DIAGNOSIS — G9340 Encephalopathy, unspecified: Secondary | ICD-10-CM | POA: Diagnosis not present

## 2021-04-03 DIAGNOSIS — N183 Chronic kidney disease, stage 3 unspecified: Secondary | ICD-10-CM | POA: Diagnosis not present

## 2021-04-03 DIAGNOSIS — E1122 Type 2 diabetes mellitus with diabetic chronic kidney disease: Secondary | ICD-10-CM | POA: Diagnosis not present

## 2021-04-03 DIAGNOSIS — R627 Adult failure to thrive: Secondary | ICD-10-CM | POA: Diagnosis not present

## 2021-04-03 DIAGNOSIS — N179 Acute kidney failure, unspecified: Secondary | ICD-10-CM | POA: Diagnosis not present

## 2021-04-03 DIAGNOSIS — E43 Unspecified severe protein-calorie malnutrition: Secondary | ICD-10-CM | POA: Diagnosis not present

## 2021-04-03 DIAGNOSIS — J984 Other disorders of lung: Secondary | ICD-10-CM | POA: Diagnosis not present

## 2021-04-03 DIAGNOSIS — I129 Hypertensive chronic kidney disease with stage 1 through stage 4 chronic kidney disease, or unspecified chronic kidney disease: Secondary | ICD-10-CM | POA: Diagnosis not present

## 2021-04-03 DIAGNOSIS — J449 Chronic obstructive pulmonary disease, unspecified: Secondary | ICD-10-CM | POA: Diagnosis not present

## 2021-04-05 DIAGNOSIS — R627 Adult failure to thrive: Secondary | ICD-10-CM | POA: Diagnosis not present

## 2021-04-05 DIAGNOSIS — E43 Unspecified severe protein-calorie malnutrition: Secondary | ICD-10-CM | POA: Diagnosis not present

## 2021-04-05 DIAGNOSIS — J449 Chronic obstructive pulmonary disease, unspecified: Secondary | ICD-10-CM | POA: Diagnosis not present

## 2021-04-05 DIAGNOSIS — G9340 Encephalopathy, unspecified: Secondary | ICD-10-CM | POA: Diagnosis not present

## 2021-04-05 DIAGNOSIS — N179 Acute kidney failure, unspecified: Secondary | ICD-10-CM | POA: Diagnosis not present

## 2021-04-05 DIAGNOSIS — I129 Hypertensive chronic kidney disease with stage 1 through stage 4 chronic kidney disease, or unspecified chronic kidney disease: Secondary | ICD-10-CM | POA: Diagnosis not present

## 2021-04-05 DIAGNOSIS — E1122 Type 2 diabetes mellitus with diabetic chronic kidney disease: Secondary | ICD-10-CM | POA: Diagnosis not present

## 2021-04-05 DIAGNOSIS — N183 Chronic kidney disease, stage 3 unspecified: Secondary | ICD-10-CM | POA: Diagnosis not present

## 2021-04-05 DIAGNOSIS — J984 Other disorders of lung: Secondary | ICD-10-CM | POA: Diagnosis not present

## 2021-04-10 ENCOUNTER — Other Ambulatory Visit: Payer: Self-pay | Admitting: Primary Care

## 2021-04-10 ENCOUNTER — Other Ambulatory Visit: Payer: Self-pay

## 2021-04-10 DIAGNOSIS — E43 Unspecified severe protein-calorie malnutrition: Secondary | ICD-10-CM | POA: Diagnosis not present

## 2021-04-10 DIAGNOSIS — G9340 Encephalopathy, unspecified: Secondary | ICD-10-CM | POA: Diagnosis not present

## 2021-04-10 DIAGNOSIS — R627 Adult failure to thrive: Secondary | ICD-10-CM | POA: Diagnosis not present

## 2021-04-10 DIAGNOSIS — N179 Acute kidney failure, unspecified: Secondary | ICD-10-CM | POA: Diagnosis not present

## 2021-04-10 DIAGNOSIS — J984 Other disorders of lung: Secondary | ICD-10-CM

## 2021-04-10 DIAGNOSIS — Z515 Encounter for palliative care: Secondary | ICD-10-CM | POA: Diagnosis not present

## 2021-04-10 DIAGNOSIS — N183 Chronic kidney disease, stage 3 unspecified: Secondary | ICD-10-CM | POA: Diagnosis not present

## 2021-04-10 DIAGNOSIS — I129 Hypertensive chronic kidney disease with stage 1 through stage 4 chronic kidney disease, or unspecified chronic kidney disease: Secondary | ICD-10-CM | POA: Diagnosis not present

## 2021-04-10 DIAGNOSIS — J449 Chronic obstructive pulmonary disease, unspecified: Secondary | ICD-10-CM

## 2021-04-10 DIAGNOSIS — E1122 Type 2 diabetes mellitus with diabetic chronic kidney disease: Secondary | ICD-10-CM | POA: Diagnosis not present

## 2021-04-10 NOTE — Progress Notes (Signed)
[facility] Consult Note Telephone: [phone]  Fax: [phone]    Date of encounter: 04/10/21 PATIENT NAME: Sherri Brooks [address] [place] [address]   [phone] (home)  DOB: 76-Apr-1947 MRN: [id number] PRIMARY CARE PROVIDER:    Redmond School, MD,  [address] [place] [address] [phone]  REFERRING PROVIDER:   Redmond School, [place],  [place] [address] [phone]  RESPONSIBLE PARTY:    Contact Information     Name Relation Home Work Nichols Daughter [phone]  [phone]        I met face to face with patient and family in home. Palliative Care was asked to follow this patient by consultation request of  Redmond School, MD to address advance care planning and complex medical decision making. This is a follow up visit.                                   ASSESSMENT AND PLAN / RECOMMENDATIONS:   Advance Care Planning/Goals of Care: Goals include to maximize quality of life and symptom management. Our advance care planning conversation included a discussion about:    Exploration of personal, cultural or spiritual beliefs that might influence medical decisions  Exploration of goals of care in the event of a sudden injury or illness  CODE STATUS: Not able to be discussed, daughter out  Symptom Management/Plan:  I met with patient in her home; her daughter had stepped out and she had a great granddaughter with her. I also evaluated her with the physical therapist Gerald Stabs from [facility]. I assessed her for function and spoke with the [facility] PT. She states that she has not made sustainable progress. She is able to  function sporadically, such as to  assist with pivot and sit unsupported for a while. PT feels that she is nearing the end of her potential.   We discussed hospice readiness and PT agreed this was indicated. I was able to speak with daughter,  who arrived  later, about patient status. Patient  has had a lot of right forehead  and peri  orbital Pain  likely due to postherpetic neuralgia.   Daughter states patient unable to tolerate gabapentin in the past. I will recommend to primary provider for Lyrica. They would like it at [facility].  We discussed hospice services and what they entail. Daughter describes patient is falling more , especially falling out of bed. We discussed that hospice could bring in the needed DME  in addition to more caregivers; alternately if family would like to wait, I would be happy to send in the order for the hospital bed. this would be needed to change positions and for safety in transfers. Daughter to consider hospice and we will touch base by phone.  Follow up Palliative Care Visit: Palliative care will continue to follow for complex medical decision making, advance care planning, and clarification of goals. Return 4 weeks or prn.  I spent 60 minutes providing this consultation. More than 50% of the time in this consultation was spent in counseling and care coordination.  PPS: 30%  HOSPICE ELIGIBILITY/DIAGNOSIS: TBD  Chief Complaint: weakness, debility  HISTORY OF PRESENT ILLNESS:  Sherri Brooks is a 75 y.o. year old female  with COPD, CAD, tobacco abuse, L Cavitating mass on Lung lobe, 25% weight loss in 9 months, increasing debility, frequent falls. Marland Kitchen  History obtained from review of EMR, discussion with primary team, and interview with family, facility staff/caregiver and/or Sherri Brooks.  I reviewed available labs, medications, imaging, studies and related documents from the EMR.  Records reviewed and summarized above.   ROS   General: NAD ENMT: denies dysphagia Cardiovascular: denies chest pain, endorses DOE Pulmonary: endorses  cough, endorses increased SOB Abdomen: endorses fair appetite, denies constipation, endorses continence of bowel GU: denies dysuria, endorses incontinence of urine MSK:    endorses  weakness,  ++ falls reported Skin: denies rashes or wounds Neurological: endorses great R face pain d/t zoster, general pain, denies insomnia, endorses being cold Psych: Endorses positive mood, endorses being cold. Heme/lymph/immuno: denies bruises, abnormal bleeding  Physical Exam: Current and past weights: Reported Pt had dx a lung nodule a year ago and has had a 30 lb wt loss in 9 months 120 lbs to 90 lbs now. BMI 15.9 Constitutional: NAD, endorses headache on and off, side headache without neuro changes General: frail appearing, thin EYES: anicteric sclera, lids intact, no discharge  ENMT: intact hearing, oral mucous membranes moist CV: S1S2, RRR, no LE edema Pulmonary: diminished sounds, ++ increased work of breathing, had cough, room air, smoking currently  Abdomen: intake 50%,  no ascites GU: deferred MSK: severe sarcopenia, moves all extremities, non -ambulatory, frequent falls Skin: warm and dry, healing zoster infection R eye, orbit and forehead Neuro:  ++ generalized weakness,  +  cognitive impairment Psych: non-anxious affect, A and O x 3 Hem/lymph/immuno: no widespread bruising   Thank you for the opportunity to participate in the care of Ms. Bulow.  The palliative care team will continue to follow. Please call our office at [phone] if we can be of additional assistance.   Jason Coop, NP , DNP, MPH, AGPCNP-BC, ACHPN  COVID-19 PATIENT SCREENING TOOL Asked and negative response unless otherwise noted:   Have you had symptoms of covid, tested positive or been in contact with someone with symptoms/positive test in the past 5-10 days?

## 2021-04-11 DIAGNOSIS — R2689 Other abnormalities of gait and mobility: Secondary | ICD-10-CM | POA: Diagnosis not present

## 2021-04-11 DIAGNOSIS — R55 Syncope and collapse: Secondary | ICD-10-CM | POA: Diagnosis not present

## 2021-04-11 DIAGNOSIS — I7025 Atherosclerosis of native arteries of other extremities with ulceration: Secondary | ICD-10-CM | POA: Diagnosis not present

## 2021-04-11 DIAGNOSIS — Z6822 Body mass index (BMI) 22.0-22.9, adult: Secondary | ICD-10-CM | POA: Diagnosis not present

## 2021-04-12 DIAGNOSIS — I129 Hypertensive chronic kidney disease with stage 1 through stage 4 chronic kidney disease, or unspecified chronic kidney disease: Secondary | ICD-10-CM | POA: Diagnosis not present

## 2021-04-12 DIAGNOSIS — E43 Unspecified severe protein-calorie malnutrition: Secondary | ICD-10-CM | POA: Diagnosis not present

## 2021-04-12 DIAGNOSIS — G9340 Encephalopathy, unspecified: Secondary | ICD-10-CM | POA: Diagnosis not present

## 2021-04-12 DIAGNOSIS — N179 Acute kidney failure, unspecified: Secondary | ICD-10-CM | POA: Diagnosis not present

## 2021-04-12 DIAGNOSIS — J984 Other disorders of lung: Secondary | ICD-10-CM | POA: Diagnosis not present

## 2021-04-12 DIAGNOSIS — J449 Chronic obstructive pulmonary disease, unspecified: Secondary | ICD-10-CM | POA: Diagnosis not present

## 2021-04-12 DIAGNOSIS — E1122 Type 2 diabetes mellitus with diabetic chronic kidney disease: Secondary | ICD-10-CM | POA: Diagnosis not present

## 2021-04-12 DIAGNOSIS — R627 Adult failure to thrive: Secondary | ICD-10-CM | POA: Diagnosis not present

## 2021-04-12 DIAGNOSIS — N183 Chronic kidney disease, stage 3 unspecified: Secondary | ICD-10-CM | POA: Diagnosis not present

## 2021-04-13 ENCOUNTER — Telehealth: Payer: Self-pay | Admitting: Primary Care

## 2021-04-13 DIAGNOSIS — I130 Hypertensive heart and chronic kidney disease with heart failure and stage 1 through stage 4 chronic kidney disease, or unspecified chronic kidney disease: Secondary | ICD-10-CM | POA: Diagnosis not present

## 2021-04-13 DIAGNOSIS — N183 Chronic kidney disease, stage 3 unspecified: Secondary | ICD-10-CM | POA: Diagnosis not present

## 2021-04-13 DIAGNOSIS — E1122 Type 2 diabetes mellitus with diabetic chronic kidney disease: Secondary | ICD-10-CM | POA: Diagnosis not present

## 2021-04-13 DIAGNOSIS — I5032 Chronic diastolic (congestive) heart failure: Secondary | ICD-10-CM | POA: Diagnosis not present

## 2021-04-13 NOTE — Telephone Encounter (Signed)
T/c from daughter, asking about re starting diuretic as pt has a cough and LE edema. I advised her to give 10 mg lasix and have her eat fruit daily, and see if cough improves. Stop for lethargy or hypotension.

## 2021-04-16 DIAGNOSIS — J449 Chronic obstructive pulmonary disease, unspecified: Secondary | ICD-10-CM | POA: Diagnosis not present

## 2021-04-19 DIAGNOSIS — G9340 Encephalopathy, unspecified: Secondary | ICD-10-CM | POA: Diagnosis not present

## 2021-04-19 DIAGNOSIS — I129 Hypertensive chronic kidney disease with stage 1 through stage 4 chronic kidney disease, or unspecified chronic kidney disease: Secondary | ICD-10-CM | POA: Diagnosis not present

## 2021-04-19 DIAGNOSIS — E1122 Type 2 diabetes mellitus with diabetic chronic kidney disease: Secondary | ICD-10-CM | POA: Diagnosis not present

## 2021-04-19 DIAGNOSIS — J449 Chronic obstructive pulmonary disease, unspecified: Secondary | ICD-10-CM | POA: Diagnosis not present

## 2021-04-19 DIAGNOSIS — E43 Unspecified severe protein-calorie malnutrition: Secondary | ICD-10-CM | POA: Diagnosis not present

## 2021-04-19 DIAGNOSIS — N179 Acute kidney failure, unspecified: Secondary | ICD-10-CM | POA: Diagnosis not present

## 2021-04-19 DIAGNOSIS — N183 Chronic kidney disease, stage 3 unspecified: Secondary | ICD-10-CM | POA: Diagnosis not present

## 2021-04-19 DIAGNOSIS — J984 Other disorders of lung: Secondary | ICD-10-CM | POA: Diagnosis not present

## 2021-04-19 DIAGNOSIS — R627 Adult failure to thrive: Secondary | ICD-10-CM | POA: Diagnosis not present

## 2021-04-26 DIAGNOSIS — J449 Chronic obstructive pulmonary disease, unspecified: Secondary | ICD-10-CM | POA: Diagnosis not present

## 2021-04-26 DIAGNOSIS — R627 Adult failure to thrive: Secondary | ICD-10-CM | POA: Diagnosis not present

## 2021-04-26 DIAGNOSIS — E43 Unspecified severe protein-calorie malnutrition: Secondary | ICD-10-CM | POA: Diagnosis not present

## 2021-04-26 DIAGNOSIS — I129 Hypertensive chronic kidney disease with stage 1 through stage 4 chronic kidney disease, or unspecified chronic kidney disease: Secondary | ICD-10-CM | POA: Diagnosis not present

## 2021-04-26 DIAGNOSIS — J984 Other disorders of lung: Secondary | ICD-10-CM | POA: Diagnosis not present

## 2021-04-26 DIAGNOSIS — G9340 Encephalopathy, unspecified: Secondary | ICD-10-CM | POA: Diagnosis not present

## 2021-04-26 DIAGNOSIS — N179 Acute kidney failure, unspecified: Secondary | ICD-10-CM | POA: Diagnosis not present

## 2021-04-26 DIAGNOSIS — N183 Chronic kidney disease, stage 3 unspecified: Secondary | ICD-10-CM | POA: Diagnosis not present

## 2021-04-26 DIAGNOSIS — E1122 Type 2 diabetes mellitus with diabetic chronic kidney disease: Secondary | ICD-10-CM | POA: Diagnosis not present

## 2021-05-11 DIAGNOSIS — R55 Syncope and collapse: Secondary | ICD-10-CM | POA: Diagnosis not present

## 2021-05-11 DIAGNOSIS — Z6822 Body mass index (BMI) 22.0-22.9, adult: Secondary | ICD-10-CM | POA: Diagnosis not present

## 2021-05-11 DIAGNOSIS — I7025 Atherosclerosis of native arteries of other extremities with ulceration: Secondary | ICD-10-CM | POA: Diagnosis not present

## 2021-05-11 DIAGNOSIS — R2689 Other abnormalities of gait and mobility: Secondary | ICD-10-CM | POA: Diagnosis not present

## 2021-05-14 DIAGNOSIS — I130 Hypertensive heart and chronic kidney disease with heart failure and stage 1 through stage 4 chronic kidney disease, or unspecified chronic kidney disease: Secondary | ICD-10-CM | POA: Diagnosis not present

## 2021-05-14 DIAGNOSIS — E1122 Type 2 diabetes mellitus with diabetic chronic kidney disease: Secondary | ICD-10-CM | POA: Diagnosis not present

## 2021-05-14 DIAGNOSIS — I5032 Chronic diastolic (congestive) heart failure: Secondary | ICD-10-CM | POA: Diagnosis not present

## 2021-05-14 DIAGNOSIS — N183 Chronic kidney disease, stage 3 unspecified: Secondary | ICD-10-CM | POA: Diagnosis not present

## 2021-05-17 DIAGNOSIS — J449 Chronic obstructive pulmonary disease, unspecified: Secondary | ICD-10-CM | POA: Diagnosis not present

## 2021-05-22 ENCOUNTER — Other Ambulatory Visit: Payer: Self-pay

## 2021-05-22 ENCOUNTER — Other Ambulatory Visit: Payer: Self-pay | Admitting: Primary Care

## 2021-05-22 ENCOUNTER — Encounter: Payer: Self-pay | Admitting: Primary Care

## 2021-05-22 DIAGNOSIS — J449 Chronic obstructive pulmonary disease, unspecified: Secondary | ICD-10-CM | POA: Diagnosis not present

## 2021-05-22 DIAGNOSIS — R627 Adult failure to thrive: Secondary | ICD-10-CM | POA: Diagnosis not present

## 2021-05-22 DIAGNOSIS — J984 Other disorders of lung: Secondary | ICD-10-CM

## 2021-05-22 DIAGNOSIS — R413 Other amnesia: Secondary | ICD-10-CM | POA: Diagnosis not present

## 2021-05-22 DIAGNOSIS — Z515 Encounter for palliative care: Secondary | ICD-10-CM | POA: Diagnosis not present

## 2021-05-22 NOTE — Progress Notes (Signed)
[facility] Consult Note Telephone: [phone]  Fax: [phone]    Date of encounter: 05/22/21 PATIENT NAME: Sherri Brooks [address] [place] [address]   [phone] (home)  DOB: 03/20/1946 MRN: [id number] PRIMARY CARE PROVIDER:    Redmond School, MD,  [address] [place] [address] [phone]  REFERRING PROVIDER:   Redmond School, [place],  [place] [address] [phone]  RESPONSIBLE PARTY:    Contact Information     Name Relation Home Work [place] Daughter [phone]  [phone]        I met face to face with patient and family in facility. Palliative Care was asked to follow this patient by consultation request of  Redmond School, MD to address advance care planning and complex medical decision making. This is a follow up visit.                                   ASSESSMENT AND PLAN / RECOMMENDATIONS:   Advance Care Planning/Goals of Care: Goals include to maximize quality of life and symptom management. Our advance care planning conversation included a discussion about:    Exploration of personal, cultural or spiritual beliefs that might influence medical decisions  Exploration of goals of care in the event of a sudden injury or illness  Review of an  advance directive document . CODE STATUS:  DNR We discussed goals of care and  while  daughter has stated she doesn't need to return to the doctor and patient feels she does. We discussed what her situation is currently with her health and what interventions would be offered. She understands that her goals are largely to maintain her quality of life at this point and there are no treatments  that she has accepted. Close monitoring to avoid hospitalization is of course warranted and we discussed that is something I and/or  her primary provider can do. She reports  improvement in  her stamina and appetite in the past  several months, perhaps due to her Marinol and Megace.   I spent 16 minutes providing this consultation. More than 50% of the time in this consultation was spent in counseling and care coordination.  -----------------------------------------------------------------------------------------------------------------------------------------------------------------------------------------------  Symptom Management/Plan:  Mobility I am assessing her for DME today;  she is alert and oriented  x 2  and wants to sometimes go out. Her daughter is able to take her out and there is a ramp in the home. She is a fall risk and is more safe in a transport chair than trying to walk with a walker. She would prefer and feel safer in a transport chair  and needs this for mobility in the house as well as out of the home to appointments or other functions. Her daughter is a willing and able Caregiver and she has other family who stay with her from time to time including granddaughters and great grandchildren.  Safety/Memory: Patient continues to have memory deficits. She participates in our conversation and says that she understands that she's told she gets up at night and tries to smoke in the house but has no memory of this. We discussed her safety while her daughter is asleep. Daughter has unplugged the stove so that she is not able to light a cigarette on it.   Smoking cessation: We discussed smoking cessation for both of them. Daughter states she should stop  to and they discuss potentially stopping smoking together. We discuss modalities that might be helpful. I will prescribe nicoderm patch if desired.    Insomnia patient is often up at night and sleeping during the day. I am prescribing trazodone 12.5 mg twice during the day and then 25 mg at bedtime, sent to [facility]. Patient's great granddaughter is available to spend the night and monitor her for falls. She does get up frequently anyway at night and  has fallen several times; this is a baseline situation.  She is educated that traxodone may increase falls and she needs monitoring with its use.  Wheezing she has a nebulizer but never received nebulizer medication so I have sent that again to her local pharmacy. Sent albuterol 2.5 ml 0.083% neb vials.  Her lungs are actually moving air pretty well even with the continued smoking. She does endorse subjective shortness of breath and I've instructed her to try nebs b.i.d. to see if that improves . If she doesn't see much change she could just keep it for PRN. She has no cough or dyspnea during our interview.   Nutrition her clothes are large and not form fitting and so it's not apparent if she's lost weight. I've asked them to obtain a low cost scale so that we can monitor her weight.  We discussed keeping up with various parameters such as  blood electrolytes and occasional albumin levels. This is something I could do or she can discuss with her primary provider. I sent her megace prescription for 40 mg  qd.  Dementia: She has some dementia and some anxiety behaviors; she is for instance pulling out a lot of her hair.  The above trazodone is focused at trying to reduce some of her anxiety. She does take alprazolam but neither her daughter nor I feel that escalating that will be wise. We discussed possible paradoxical effect even though she is a long-time user, or just lack of efficacy at this point.    Follow up Palliative Care Visit: Palliative care will continue to follow for complex medical decision making, advance care planning, and clarification of goals. Return 2 weeks or prn.  This visit was coded based on medical decision making (MDM).  PPS: 40%  HOSPICE ELIGIBILITY/DIAGNOSIS: TBD  Chief Complaint: debility, SOB,   HISTORY OF PRESENT ILLNESS:  Sherri Brooks is a 75 y.o. year old female  with lung mass, weight loss, dementia and frequent falls. She presents today with debility and agitation.  She is awake in the night and sometimes roams and falls in the home. She has tried to light cigarettes on the stove, and is not sleeping well. She has increased in ability to be able to get oob (I) but remains a fall risk. Daughter wishes to improve her sleep and seeks intervention to decrease anxiety and increase sleep.   History obtained from review of EMR, discussion with primary team, and interview with family, facility staff/caregiver and/or Sherri Brooks.  I reviewed available labs, medications, imaging, studies and related documents from the EMR.  Records reviewed and summarized above.   ROS  General: NAD EYES: denies vision changes ENMT: denies dysphagia Cardiovascular: denies chest pain, denies DOE Pulmonary: denies cough, endorses increased SOB Abdomen: endorses fair appetite, denies constipation, endorses continence of bowel GU: denies dysuria, endorses continence of urine MSK:  endorses weakness,  occ  falls reported Skin: denies rashes or wounds, endorses itching scalp Neurological: denies pain, denies insomnia Psych: Endorses positive mood Heme/lymph/immuno: denies  bruises, abnormal bleeding  Physical Exam: Current and past weights: unavailable Constitutional: NAD General: frail appearing, thin EYES: anicteric sclera, lids intact, no discharge  ENMT: intact hearing, oral mucous membranes moist CV: no LE edema Pulmonary: LCTA, no increased work of breathing, no cough, room air Abdomen: intake 50%, no ascites GU: deferred MSK: severe sarcopenia, moves all extremities, ambulatory with help, frequent falls Skin: warm and dry, no rashes or wounds on visible skin Neuro:  ++ generalized weakness,  + cognitive impairment Psych: anxious affect, A and O x 2 Hem/lymph/immuno: no widespread bruising   Thank you for the opportunity to participate in the care of Sherri Brooks.  The palliative care team will continue to follow. Please call our office at [phone] if we can be of  additional assistance.   Jason Coop, NP  [facility]  COVID-19 PATIENT SCREENING TOOL Asked and negative response unless otherwise noted:   Have you had symptoms of covid, tested positive or been in contact with someone with symptoms/positive test in the past 5-10 days?

## 2021-06-05 ENCOUNTER — Other Ambulatory Visit: Payer: Self-pay

## 2021-06-05 ENCOUNTER — Telehealth: Payer: Self-pay | Admitting: Primary Care

## 2021-06-05 ENCOUNTER — Other Ambulatory Visit: Payer: Self-pay | Admitting: Primary Care

## 2021-06-05 DIAGNOSIS — E43 Unspecified severe protein-calorie malnutrition: Secondary | ICD-10-CM

## 2021-06-05 DIAGNOSIS — R413 Other amnesia: Secondary | ICD-10-CM

## 2021-06-05 DIAGNOSIS — J449 Chronic obstructive pulmonary disease, unspecified: Secondary | ICD-10-CM | POA: Diagnosis not present

## 2021-06-05 DIAGNOSIS — R627 Adult failure to thrive: Secondary | ICD-10-CM

## 2021-06-05 DIAGNOSIS — J984 Other disorders of lung: Secondary | ICD-10-CM

## 2021-06-05 DIAGNOSIS — Z515 Encounter for palliative care: Secondary | ICD-10-CM | POA: Diagnosis not present

## 2021-06-05 NOTE — Progress Notes (Signed)
[facility] Consult Note Telephone: [phone]  Fax: [phone]    Date of encounter: 06/05/21 12:59 PM PATIENT NAME: Sherri Brooks [address] [place] [address]   [phone] (home)  DOB: August 12, 1946 MRN: [id number] PRIMARY CARE PROVIDER:    Redmond School, MD,  [address] [place] [address] [phone]  REFERRING PROVIDER:   Redmond School, [place],  [place] [address] [phone]  RESPONSIBLE PARTY:    Contact Information     Name Relation Home Work Sherri Brooks Daughter [phone]  [phone]        I met face to face with patient and family in  home. Palliative Care was asked to follow this patient by consultation request of  Redmond School, MD to address advance care planning and complex medical decision making. This is a follow up visit.                                   ASSESSMENT AND PLAN / RECOMMENDATIONS:   Advance Care Planning/Goals of Care: Goals include to maximize quality of life and symptom management. Our advance care planning conversation included a discussion about:    The value and importance of advance care planning  Experiences with loved ones who have been seriously ill or have died  Exploration of personal, cultural or spiritual beliefs that might influence medical decisions  Exploration of goals of care in the event of a sudden injury or illness  Review of an  advance directive document . CODE STATUS: Pt has DNR but full scope. Today I introduced hospice program as appropriate. Pt intake is declining and her activity is as well. She has presumed lung cancer. She is spending a lot of time in bed. Daughter reports she is trying to "pack her clothes and go home", sometimes seen as EOL ideation. Daughter asked about ongoing labs and transfusions. She is not seeing anything concerning at present, but we discussed hospice being indicated when a  patient would like to stop life prolonging measures or these measures no longer have benefit. I offered admission at this point but daughter wants to wait. She did have experience with her father on hospice, which was positive.  Symptom Management/Plan:  Nutrition: Eats but not great quantity. Scale given today as she has not been able to get one. Patient subjectively appears to be continuing to lose weight. Takes supplements on occasion but cannot finish one. Med Pass recommended.   Mobility: Patient has decreased in her activity. She has been taking tramadol 12.5 mg in the day x 2, and 25 mg at hs without increased falls. This has made her calmer, as she was trying to get OOB but frequently falling. She has a wheel chair at bedside to get oob.  Requested transport chair did not arrive. Daughter states they did not get one. She also states she does not answer her phone, and does not have voice mail set up. I have refaxed the note and order for transport chair to [facility] today, and asked daughter to answer calls and daughter states she will  set up voice mail.  Safety: Daughter has hs sitter ( family ) when needed, and put in cameras to the room. Pt persists in smoking in bed. I have outlined the dangers of this to both patient and daughter. Both are smokers of long standing.  Her dementia sometimes compels her  to try to go find someone 'who owes her money'. Daughter states she tries to get someone to go see her brother, but she does not believe anyone would take her. She denies elopement risk despite her ideation.  Follow up Palliative Care Visit: Palliative care will continue to follow for complex medical decision making, advance care planning, and clarification of goals. Return 4-6 weeks or prn.  I spent 60 minutes providing this consultation. More than 50% of the time in this consultation was spent in counseling and care coordination.  PPS: 30%  HOSPICE ELIGIBILITY/DIAGNOSIS: yes/lung  mass  Chief Complaint: fatigue, immobility, anorexia  HISTORY OF PRESENT ILLNESS:  Sherri Brooks is a 75 y.o. year old female  with h/o lung mass, FTT, severe weight loss and calorie malnutrition. She endorses poor intake and anorexia. She is taking some supplements . She has a presumed advancing lung cancer.  History obtained from review of EMR, discussion with primary team, and interview with family, facility staff/caregiver and/or Ms. Marini.  I reviewed available labs, medications, imaging, studies and related documents from the EMR.  Records reviewed and summarized above.   ROS   General: NAD ENMT: endorses some dysphagia Cardiovascular: denies chest pain, denies DOE Pulmonary: endorses  cough, denies increased SOB Abdomen: endorses poor appetite, denies constipation, endorses incontinence of bowel GU: denies dysuria, endorses incontinence of urine MSK:  endorses  weakness,  no falls reported Skin: denies rashes or wounds Neurological: denies pain, denies insomnia Psych: Endorses positive mood, confusion at times Heme/lymph/immuno: denies bruises, abnormal bleeding  Physical Exam: Current and past weights:unavailable but subjectively less than at admission. Constitutional: NAD General: frail appearing, thin EYES: anicteric sclera, lids intact, no discharge  ENMT: intact hearing, oral mucous membranes dry CV: no LE edema Pulmonary: slight  increased work of breathing, no cough, room air Abdomen: intake 25%, no ascites GU: deferred MSK: severe sarcopenia, moves all extremities, non ambulatory Skin: warm and dry, no rashes or wounds on visible skin Neuro:  ++  generalized weakness,  ++ cognitive impairment Psych: non-anxious affect, A and O x 2 Hem/lymph/immuno: no widespread bruising  Thank you for the opportunity to participate in the care of Ms. Leggitt.  The palliative care team will continue to follow. Please call our office at [phone] if we can be of additional  assistance.   Jason Coop, NP   COVID-19 PATIENT SCREENING TOOL Asked and negative response unless otherwise noted:   Have you had symptoms of covid, tested positive or been in contact with someone with symptoms/positive test in the past 5-10 days?

## 2021-06-05 NOTE — Telephone Encounter (Signed)
Faxed order for transport  chair for second time to [facility]. Clarified daughter will expect the call.

## 2021-06-14 DIAGNOSIS — E1122 Type 2 diabetes mellitus with diabetic chronic kidney disease: Secondary | ICD-10-CM | POA: Diagnosis not present

## 2021-06-14 DIAGNOSIS — I130 Hypertensive heart and chronic kidney disease with heart failure and stage 1 through stage 4 chronic kidney disease, or unspecified chronic kidney disease: Secondary | ICD-10-CM | POA: Diagnosis not present

## 2021-06-14 DIAGNOSIS — I5032 Chronic diastolic (congestive) heart failure: Secondary | ICD-10-CM | POA: Diagnosis not present

## 2021-06-14 DIAGNOSIS — N183 Chronic kidney disease, stage 3 unspecified: Secondary | ICD-10-CM | POA: Diagnosis not present

## 2021-06-17 DIAGNOSIS — J449 Chronic obstructive pulmonary disease, unspecified: Secondary | ICD-10-CM | POA: Diagnosis not present

## 2021-07-03 ENCOUNTER — Other Ambulatory Visit: Payer: Self-pay

## 2021-07-03 ENCOUNTER — Other Ambulatory Visit: Payer: Self-pay | Admitting: Primary Care

## 2021-07-04 ENCOUNTER — Other Ambulatory Visit: Payer: Self-pay

## 2021-07-04 ENCOUNTER — Other Ambulatory Visit: Payer: Self-pay | Admitting: Primary Care

## 2021-07-04 DIAGNOSIS — Z515 Encounter for palliative care: Secondary | ICD-10-CM

## 2021-07-04 DIAGNOSIS — R413 Other amnesia: Secondary | ICD-10-CM

## 2021-07-04 DIAGNOSIS — J449 Chronic obstructive pulmonary disease, unspecified: Secondary | ICD-10-CM

## 2021-07-04 DIAGNOSIS — J984 Other disorders of lung: Secondary | ICD-10-CM

## 2021-07-04 DIAGNOSIS — E43 Unspecified severe protein-calorie malnutrition: Secondary | ICD-10-CM | POA: Diagnosis not present

## 2021-07-04 NOTE — Progress Notes (Signed)
[facility] Consult Note Telephone: [phone]  Fax: [phone]    Date of encounter: 07/04/21 12:56 PM PATIENT NAME: Sherri Brooks [address] [place] [address]   [phone] (home)  DOB: 03-14-1946 MRN: [id number] PRIMARY CARE PROVIDER:    Redmond School, MD,  [address] [place] [address] [phone]  REFERRING PROVIDER:   Redmond School, [place],  [place] [address] [phone]  RESPONSIBLE PARTY:    Contact Information     Name Relation Home Work [place] Daughter [phone]  [phone]        I met face to face with patient and family in home. Palliative Care was asked to follow this patient by consultation request of  Redmond School, MD to address advance care planning and complex medical decision making. This is a follow up visit.         ASSESSMENT AND PLAN / RECOMMENDATIONS:   Advance Care Planning/Goals of Care: Goals include to maximize quality of life and symptom management. Our advance care planning conversation included a discussion about:     Experiences with loved ones who have been seriously ill or have died  Exploration of personal, cultural or spiritual beliefs that might influence medical decisions  Exploration of goals of care in the event of a sudden injury or illness   Review of an  advance directive document  Decisionto de-escalate disease focused treatments due to poor prognosis. Daughter elects hospice. Hospice services outlined, and goals of care discussed. Daughter [facility]) feels it's time due to her ongoing decline and need for more supportive care. CODE STATUS: DNR, in home.  Reviewed MOST form choices. Discussed choice for full medical interventions. She would like to continue to discuss this  choice in the context of hospice.  T/c to PCP to discuss hospice certification and will make referral to [facility] per family  request if PCP agrees.  I spent 20 minutes providing this consultation. More than 50% of the time in this consultation was spent in counseling and care coordination. ________________________________________________________________________________________________________________  Symptom Management/Plan:  Nutrition: Not eating well, using supplements. Has been eating less and less, but drinking supplements. Discussed decreased intake and disease process is likely from lung mass in addition to dementia.  Skin break down: Has been lying down more, but no break down.  Discussed egg crate for bed as she spends more time in bed and loses more weight. She is also increasingly incontinent and needs prevention of incontinence related break down of skin.  Mobility: Needs hospital  bed to reposition and transfer. Is eating lying down and a risk for aspiration, With side rails due to attempts to get OOB (I). Will refer to hospice to follow up and address DME needs. POA Would like to have placard form from dmv in case she needs to transport her mother. Pt has [employer] [place] and living with daughter in [place].  Medication review done, and discussed in anticipation of any de prescribing that might be discussed with hospice team.  Follow up Palliative Care Visit: Refer to hospice, PC  f/u if not admitted.  This visit was coded based on medical decision making (MDM).  PPS: 30%  HOSPICE ELIGIBILITY/DIAGNOSIS: TBD  Chief Complaint: protein calorie malnutrition  HISTORY OF PRESENT ILLNESS:  Sherri Brooks is a 75 y.o. year old female  with L upper lobe cavitating lung mass, COPD advanced with tobacco abuse, dementia, protein calorie malnutrition. Pt continues to decline in intake  and function. She is no longer able to safely self transfer and sometimes too fatigued to do with help. Despite cachexia she has oxygen saturations WNL and no need for oxygen at this point. However, her disease is advancing and she is showing  more confusion. Family is opting for hospice services.  History obtained from review of EMR, discussion with primary team, and interview with family, facility staff/caregiver and/or Sherri Brooks.  I reviewed available labs, medications, imaging, studies and related documents from the EMR.  Records reviewed and summarized above.    Patient Active Problem List   Diagnosis Date Noted   Protein-calorie malnutrition, severe 03/24/2021   Failure to thrive in adult 03/24/2021   Acute encephalopathy 03/23/2021   Microcytic anemia 03/23/2021   Acute renal failure superimposed on stage 3 chronic kidney disease ([place]) 03/23/2021   Herpes zoster ophthalmicus of right eye 03/23/2021   Failure to thrive (child) 03/23/2021   COPD (chronic obstructive pulmonary disease) ([place]) 08/11/2020   Nodule of parotid gland 04/27/2020   CKD (chronic kidney disease) stage 3, GFR 30-59 ml/min ([place]) 01/22/2020   Memory loss    Acute metabolic encephalopathy 94/50/3888   Cavitating mass in left upper lung lobe 01/21/2020   Acute kidney injury ([place]) 06/10/2017   Dyspnea 06/10/2017   Essential hypertension 06/10/2017   PVD (peripheral vascular disease) ([place] [employer])    Ankle fracture, bimalleolar, closed 12/29/2012   Anemia of unknown etiology 11/06/2012   Atherosclerotic PVD with ulceration ([place]) 11/04/2012   Type 2 diabetes mellitus ([place]) 11/04/2012   Tobacco use disorder 11/04/2012   Open wound of left foot 11/04/2012   Atherosclerosis of native arteries of the extremities with ulceration ([place]) 07/09/2012   Weight loss, unintentional 09/18/2011   Anorexia 09/18/2011   Early satiety 09/18/2011   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/07/2011    ROS  General: NAD EYES: denies vision changes ENMT: endorses mild dysphagia Cardiovascular: denies chest pain, endorses mile DOE Pulmonary: denies cough, denies increased SOB at baseline Abdomen: endorses fair appetite, denies constipation, endorses incontinence of bowel GU:  denies dysuria, endorses incontinence of urine MSK:  endorses weakness,  no falls reported recently, h/o falls Skin: denies rashes or wounds Neurological: denies pain, denies insomnia Psych: Endorses positive mood Heme/lymph/immuno: denies bruises, abnormal bleeding  Physical Exam: Current and past weights: unavailable but subjectively has lost more weight, is cachectic. BMI 16.1 in 6/22, family reports continued wt loss and upper arm circ. 22 cm ( no previous measurement) Constitutional: NAD, BP 175/70 (bp meds not given yet) HR 83 RR 20 General: frail appearing,  cachectic EYES: anicteric sclera, lids intact, no discharge  ENMT: intact hearing, oral mucous membranes dry missing dentition CV: S1S2, RRR, no LE edema Pulmonary: LCTA, no increased work of breathing, no cough, room air, PO2=99% Abdomen: intake 25-50%, no ascites GU: deferred MSK: severe sarcopenia, moves all extremities, non ambulatory Skin: warm and dry, no rashes or wounds on visible skin Neuro:  severe  generalized weakness,  severe cognitive impairment Psych: non-anxious affect, A and O x 2 Hem/lymph/immuno: no widespread bruising   Thank you for the opportunity to participate in the care of Ms. Loos.  The palliative care team will continue to follow. Please call our office at [phone] if we can be of additional assistance.   Jason Coop, NP   COVID-19 PATIENT SCREENING TOOL Asked and negative response unless otherwise noted:   Have you had symptoms of covid, tested positive or been in contact with someone with symptoms/positive test  in the past 5-10 days?

## 2021-07-05 DIAGNOSIS — R404 Transient alteration of awareness: Secondary | ICD-10-CM | POA: Diagnosis not present

## 2021-07-16 DIAGNOSIS — I469 Cardiac arrest, cause unspecified: Secondary | ICD-10-CM | POA: Diagnosis not present

## 2021-07-16 DIAGNOSIS — I499 Cardiac arrhythmia, unspecified: Secondary | ICD-10-CM | POA: Diagnosis not present

## 2021-07-16 DIAGNOSIS — R404 Transient alteration of awareness: Secondary | ICD-10-CM | POA: Diagnosis not present

## 2021-07-17 DIAGNOSIS — J449 Chronic obstructive pulmonary disease, unspecified: Secondary | ICD-10-CM | POA: Diagnosis not present

## 2021-08-15 DIAGNOSIS — 419620001 Death: Secondary | SNOMED CT | POA: Diagnosis not present

## 2021-08-15 DEATH — deceased

## 2021-10-13 ENCOUNTER — Encounter: Payer: Self-pay | Admitting: *Deleted
